# Patient Record
Sex: Male | Born: 1945
Health system: Southern US, Community
[De-identification: ages and names within clinical notes are randomized; demographics above are authoritative.]

## PROBLEM LIST (undated history)

## (undated) DIAGNOSIS — M1990 Unspecified osteoarthritis, unspecified site: Secondary | ICD-10-CM

## (undated) DIAGNOSIS — I2510 Atherosclerotic heart disease of native coronary artery without angina pectoris: Secondary | ICD-10-CM

## (undated) DIAGNOSIS — I10 Essential (primary) hypertension: Secondary | ICD-10-CM

## (undated) DIAGNOSIS — I5022 Chronic systolic (congestive) heart failure: Secondary | ICD-10-CM

## (undated) DIAGNOSIS — N183 Chronic kidney disease, stage 3 unspecified: Secondary | ICD-10-CM

## (undated) DIAGNOSIS — I255 Ischemic cardiomyopathy: Secondary | ICD-10-CM

## (undated) DIAGNOSIS — M109 Gout, unspecified: Secondary | ICD-10-CM

## (undated) DIAGNOSIS — E785 Hyperlipidemia, unspecified: Secondary | ICD-10-CM

## (undated) DIAGNOSIS — I509 Heart failure, unspecified: Secondary | ICD-10-CM

## (undated) HISTORY — PX: CARDIAC CATHETERIZATION: SHX172

## (undated) HISTORY — PX: CARDIAC SURGERY: SHX584

## (undated) HISTORY — DX: Hyperlipidemia, unspecified: E78.5

## (undated) HISTORY — DX: Chronic kidney disease, stage 3 unspecified: N18.30

## (undated) HISTORY — DX: Unspecified osteoarthritis, unspecified site: M19.90

## (undated) HISTORY — DX: Heart failure, unspecified: I50.9

## (undated) HISTORY — DX: Ischemic cardiomyopathy: I25.5

## (undated) HISTORY — DX: Chronic systolic (congestive) heart failure: I50.22

---

## 2004-10-16 ENCOUNTER — Emergency Department: Payer: Self-pay | Admitting: Emergency Medicine

## 2008-10-04 ENCOUNTER — Emergency Department: Payer: Self-pay | Admitting: Emergency Medicine

## 2009-08-04 ENCOUNTER — Emergency Department: Payer: Self-pay | Admitting: Emergency Medicine

## 2010-07-02 ENCOUNTER — Emergency Department: Payer: Self-pay | Admitting: Emergency Medicine

## 2011-11-07 ENCOUNTER — Emergency Department: Payer: Self-pay | Admitting: Emergency Medicine

## 2011-11-07 LAB — BASIC METABOLIC PANEL
BUN: 12 mg/dL (ref 7–18)
Chloride: 105 mmol/L (ref 98–107)
Co2: 27 mmol/L (ref 21–32)
Creatinine: 1.22 mg/dL (ref 0.60–1.30)
EGFR (Non-African Amer.): >60
Osmolality: 279 (ref 275–301)
Potassium: 4 mmol/L (ref 3.5–5.1)
Sodium: 140 mmol/L (ref 136–145)

## 2011-11-07 LAB — CBC
HCT: 45.8 % (ref 40.0–52.0)
HGB: 15.9 g/dL (ref 13.0–18.0)
MCH: 30.5 pg (ref 26.0–34.0)
MCV: 88 fL (ref 80–100)
Platelet: 261 10*3/uL (ref 150–440)
RBC: 5.2 10*6/uL (ref 4.40–5.90)
WBC: 8.2 10*3/uL (ref 3.8–10.6)

## 2011-11-07 LAB — TROPONIN I
Troponin-I: <0.02 ng/mL
Troponin-I: <0.02 ng/mL

## 2013-09-20 ENCOUNTER — Emergency Department: Payer: Self-pay | Admitting: Emergency Medicine

## 2013-09-20 LAB — COMPREHENSIVE METABOLIC PANEL
ALK PHOS: 65 U/L
AST: 29 U/L (ref 15–37)
Albumin: 3.7 g/dL (ref 3.4–5.0)
Anion Gap: 7 (ref 7–16)
BUN: 15 mg/dL (ref 7–18)
Bilirubin,Total: 0.6 mg/dL (ref 0.2–1.0)
Calcium, Total: 9 mg/dL (ref 8.5–10.1)
Chloride: 106 mmol/L (ref 98–107)
Co2: 25 mmol/L (ref 21–32)
Creatinine: 1.22 mg/dL (ref 0.60–1.30)
EGFR (Non-African Amer.): >60
GLUCOSE: 121 mg/dL — AB (ref 65–99)
Osmolality: 278 (ref 275–301)
Potassium: 3.5 mmol/L (ref 3.5–5.1)
SGPT (ALT): 40 U/L
Sodium: 138 mmol/L (ref 136–145)
Total Protein: 8.3 g/dL — ABNORMAL HIGH (ref 6.4–8.2)

## 2013-09-20 LAB — CBC WITH DIFFERENTIAL/PLATELET
BASOS ABS: 0.1 10*3/uL (ref 0.0–0.1)
Basophil %: 1.3 %
EOS ABS: 0.2 10*3/uL (ref 0.0–0.7)
Eosinophil %: 1.7 %
HCT: 42 % (ref 40.0–52.0)
HGB: 14.1 g/dL (ref 13.0–18.0)
Lymphocyte #: 2.6 10*3/uL (ref 1.0–3.6)
Lymphocyte %: 28.2 %
MCH: 29.5 pg (ref 26.0–34.0)
MCHC: 33.5 g/dL (ref 32.0–36.0)
MCV: 88 fL (ref 80–100)
MONO ABS: 1.1 x10 3/mm — AB (ref 0.2–1.0)
MONOS PCT: 12 %
Neutrophil #: 5.2 10*3/uL (ref 1.4–6.5)
Neutrophil %: 56.8 %
PLATELETS: 351 10*3/uL (ref 150–440)
RBC: 4.76 10*6/uL (ref 4.40–5.90)
RDW: 14.6 % — ABNORMAL HIGH (ref 11.5–14.5)
WBC: 9.1 10*3/uL (ref 3.8–10.6)

## 2015-04-29 ENCOUNTER — Encounter: Payer: Self-pay | Admitting: Emergency Medicine

## 2015-04-29 ENCOUNTER — Emergency Department: Payer: Medicare HMO

## 2015-04-29 ENCOUNTER — Emergency Department
Admission: EM | Admit: 2015-04-29 | Discharge: 2015-04-29 | Disposition: A | Discharge status: Home / Self Care | Payer: Medicare HMO | Attending: Emergency Medicine | Admitting: Emergency Medicine

## 2015-04-29 DIAGNOSIS — R509 Fever, unspecified: Secondary | ICD-10-CM | POA: Insufficient documentation

## 2015-04-29 DIAGNOSIS — R05 Cough: Secondary | ICD-10-CM | POA: Diagnosis present

## 2015-04-29 DIAGNOSIS — Z951 Presence of aortocoronary bypass graft: Secondary | ICD-10-CM | POA: Insufficient documentation

## 2015-04-29 DIAGNOSIS — J209 Acute bronchitis, unspecified: Secondary | ICD-10-CM | POA: Insufficient documentation

## 2015-04-29 DIAGNOSIS — I2510 Atherosclerotic heart disease of native coronary artery without angina pectoris: Secondary | ICD-10-CM | POA: Insufficient documentation

## 2015-04-29 DIAGNOSIS — I10 Essential (primary) hypertension: Secondary | ICD-10-CM | POA: Diagnosis not present

## 2015-04-29 HISTORY — DX: Essential (primary) hypertension: I10

## 2015-04-29 HISTORY — DX: Atherosclerotic heart disease of native coronary artery without angina pectoris: I25.10

## 2015-04-29 LAB — URINALYSIS COMPLETE WITH MICROSCOPIC (ARMC ONLY)
BILIRUBIN URINE: NEGATIVE
Bacteria, UA: NONE SEEN
GLUCOSE, UA: NEGATIVE mg/dL
KETONES UR: NEGATIVE mg/dL
LEUKOCYTES UA: NEGATIVE
NITRITE: NEGATIVE
Protein, ur: 30 mg/dL — AB
SQUAMOUS EPITHELIAL / LPF: NONE SEEN
Specific Gravity, Urine: 1.017 (ref 1.005–1.030)
pH: 5 (ref 5.0–8.0)

## 2015-04-29 LAB — BASIC METABOLIC PANEL
ANION GAP: 7 (ref 5–15)
BUN: 16 mg/dL (ref 6–20)
CALCIUM: 8.9 mg/dL (ref 8.9–10.3)
CO2: 23 mmol/L (ref 22–32)
Chloride: 106 mmol/L (ref 101–111)
Creatinine, Ser: 1.34 mg/dL — ABNORMAL HIGH (ref 0.61–1.24)
GFR, EST NON AFRICAN AMERICAN: 52 mL/min — AB (ref >60)
Glucose, Bld: 114 mg/dL — ABNORMAL HIGH (ref 65–99)
POTASSIUM: 4.2 mmol/L (ref 3.5–5.1)
SODIUM: 136 mmol/L (ref 135–145)

## 2015-04-29 LAB — CBC WITH DIFFERENTIAL/PLATELET
BASOS ABS: 0.1 10*3/uL (ref 0–0.1)
BASOS PCT: 1 %
EOS PCT: 6 %
Eosinophils Absolute: 0.3 10*3/uL (ref 0–0.7)
HCT: 44.6 % (ref 40.0–52.0)
Hemoglobin: 15 g/dL (ref 13.0–18.0)
LYMPHS PCT: 29 %
Lymphs Abs: 1.8 10*3/uL (ref 1.0–3.6)
MCH: 29 pg (ref 26.0–34.0)
MCHC: 33.5 g/dL (ref 32.0–36.0)
MCV: 86.4 fL (ref 80.0–100.0)
MONO ABS: 0.9 10*3/uL (ref 0.2–1.0)
Monocytes Relative: 14 %
NEUTROS ABS: 3.1 10*3/uL (ref 1.4–6.5)
Neutrophils Relative %: 50 %
PLATELETS: 209 10*3/uL (ref 150–440)
RBC: 5.17 MIL/uL (ref 4.40–5.90)
RDW: 14.4 % (ref 11.5–14.5)
WBC: 6.1 10*3/uL (ref 3.8–10.6)

## 2015-04-29 LAB — RAPID INFLUENZA A&B ANTIGENS (ARMC ONLY): INFLUENZA A (ARMC): NEGATIVE

## 2015-04-29 LAB — RAPID INFLUENZA A&B ANTIGENS: Influenza B (ARMC): NEGATIVE

## 2015-04-29 MED ORDER — IPRATROPIUM-ALBUTEROL 0.5-2.5 (3) MG/3ML IN SOLN
3.0000 mL | Freq: Once | RESPIRATORY_TRACT | Status: AC
  Administered 2015-04-29: 3 mL via RESPIRATORY_TRACT
  Filled 2015-04-29: qty 3

## 2015-04-29 MED ORDER — AZITHROMYCIN 250 MG PO TABS: ORAL_TABLET | ORAL | Status: AC

## 2015-04-29 MED ORDER — GUAIFENESIN-CODEINE 100-10 MG/5ML PO SOLN: 5.0000 mL | Freq: Four times a day (QID) | ORAL | Status: DC | PRN

## 2015-04-29 MED ORDER — HYDROCOD POLST-CPM POLST ER 10-8 MG/5ML PO SUER
5.0000 mL | Freq: Once | ORAL | Status: AC
  Administered 2015-04-29: 5 mL via ORAL
  Filled 2015-04-29: qty 5

## 2015-04-29 NOTE — ED Notes (Signed)
C/o fever x 1 week.  Also c/o productive cough and frequent urination.

## 2015-04-29 NOTE — Discharge Instructions (Signed)

## 2015-04-29 NOTE — ED Notes (Signed)
Pt unable to void at this moment. Given cup for when is able to urinate.

## 2015-04-29 NOTE — ED Provider Notes (Signed)
[facility] Emergency Department Provider Note  Time seen: 9:26 PM  I have reviewed the triage vital signs and the nursing notes.   HISTORY  Chief Complaint Fever and Cough    HPI Benjamin Valentine is a 70 y.o. male who presents to the emergency department with cough, congestion and fever. According to the patient for the past one week he has had a cough with fever. Patient states the cough is productive of a yellowish sputum. Denies abdominal pain. Denies dysuria. States some nausea denies vomiting or diarrhea. Patient states his symptoms have seemed to worsen with increased cough so he came to the emergency department for evaluation. Patient denies chest pain. Does states shortness breath but mostly with cough.Describes his cough as significant.     Past Medical History  Diagnosis Date  . Hypertension   . Coronary artery disease     There are no active problems to display for this patient.   Past Surgical History  Procedure Laterality Date  . Cardiac surgery      CABG 2002    No current outpatient prescriptions on file.  Allergies Review of patient's allergies indicates no known allergies.  No family history on file.  Social History Social History  Substance Use Topics  . Smoking status: Never Smoker   . Smokeless tobacco: Never Used  . Alcohol Use: No    Review of Systems Constitutional: Negative for fever. Cardiovascular: Negative for chest pain. Respiratory: Positive shortness of breath with frequent cough. Positive for sputum. Gastrointestinal: Negative for abdominal pain Genitourinary: Negative for dysuria. Positive for urinary frequency. Musculoskeletal: Negative for back pain. Neurological: Negative for headache 10-point ROS otherwise negative.  ____________________________________________   PHYSICAL EXAM:  VITAL SIGNS: ED Triage Vitals  Enc Vitals Group     BP 04/29/15 1822 167/79 mmHg     Pulse Rate 04/29/15 1822 31      Resp 04/29/15 1822 20     Temp 04/29/15 1822 99.5 F (37.5 C)     Temp Source 04/29/15 1822 Oral     SpO2 04/29/15 1822 97 %     Weight 04/29/15 1822 222 lb (100.699 kg)     Height 04/29/15 1822  (1.753 m)     Head Cir --      Peak Flow --      Pain Score 04/29/15 1824 5     Pain Loc --      Pain Edu? --      Excl. in GC? --     Constitutional: Alert and oriented. Well appearing and in no distress. Eyes: Normal exam ENT   Head: Normocephalic and atraumatic.   Mouth/Throat: Mucous membranes are moist. Cardiovascular: Normal rate, regular rhythm. No murmur Respiratory: Normal respiratory effort without tachypnea nor retractions. Breath sounds are clear. Frequent cough. Gastrointestinal: Soft and nontender. No distention.   Musculoskeletal: Nontender with normal range of motion in all extremities. Neurologic:  Normal speech and language. No gross focal neurologic deficits Skin:  Skin is warm, dry and intact.  Psychiatric: Mood and affect are normal. Speech and behavior are normal.   ____________________________________________   RADIOLOGY  Mild vascular congestion on chest x-ray.   INITIAL IMPRESSION / ASSESSMENT AND PLAN / ED COURSE  Pertinent labs & imaging results that were available during my care of the patient were reviewed by me and considered in my medical decision making (see chart for details).  Patient presents with continued cough and fever times one week. Believes his symptoms  have worsened since onset of improving so he came to the emergency department for evaluation. Patient has a temperature of 99.5 in the emergency department. Vitals are otherwise reassuring, moderately hypertensive currently. Labs are largely within normal limits including a normal white blood cell count. Chest x-ray shows mild fascia congestion otherwise normal. Currently awaiting flu results. Suspect likely a viral process versus acute bronchitis.  Labs largely within normal  limits. Influenza negative. Likely bronchitis. We'll discharge with antibiotics, cough medication and have the patient follow-up with his primary care physician. Patient agreeable to plan.  ____________________________________________   FINAL CLINICAL IMPRESSION(S) / ED DIAGNOSES  Upper respiratory infection Bronchitis  Minna AntisKevin Jalisia Puchalski, MD 04/29/15 2333

## 2015-09-28 ENCOUNTER — Emergency Department
Admission: EM | Admit: 2015-09-28 | Discharge: 2015-09-28 | Disposition: A | Discharge status: Home / Self Care | Payer: Medicare HMO | Attending: Emergency Medicine | Admitting: Emergency Medicine

## 2015-09-28 ENCOUNTER — Emergency Department: Payer: Medicare HMO

## 2015-09-28 DIAGNOSIS — R42 Dizziness and giddiness: Secondary | ICD-10-CM | POA: Diagnosis not present

## 2015-09-28 DIAGNOSIS — I10 Essential (primary) hypertension: Secondary | ICD-10-CM | POA: Insufficient documentation

## 2015-09-28 LAB — BASIC METABOLIC PANEL WITH GFR
Anion gap: 6 (ref 5–15)
BUN: 20 mg/dL (ref 6–20)
CO2: 25 mmol/L (ref 22–32)
Calcium: 9 mg/dL (ref 8.9–10.3)
Chloride: 108 mmol/L (ref 101–111)
Creatinine, Ser: 1.23 mg/dL (ref 0.61–1.24)
GFR calc Af Amer: >60 mL/min
GFR calc non Af Amer: 57 mL/min — ABNORMAL LOW
Glucose, Bld: 144 mg/dL — ABNORMAL HIGH (ref 65–99)
Potassium: 4.2 mmol/L (ref 3.5–5.1)
Sodium: 139 mmol/L (ref 135–145)

## 2015-09-28 LAB — CBC
HCT: 43 % (ref 40.0–52.0)
Hemoglobin: 14.5 g/dL (ref 13.0–18.0)
MCH: 29.3 pg (ref 26.0–34.0)
MCHC: 33.8 g/dL (ref 32.0–36.0)
MCV: 86.9 fL (ref 80.0–100.0)
Platelets: 243 K/uL (ref 150–440)
RBC: 4.95 MIL/uL (ref 4.40–5.90)
RDW: 15 % — ABNORMAL HIGH (ref 11.5–14.5)
WBC: 6.7 K/uL (ref 3.8–10.6)

## 2015-09-28 LAB — URINALYSIS COMPLETE WITH MICROSCOPIC (ARMC ONLY)
Bacteria, UA: NONE SEEN
Bilirubin Urine: NEGATIVE
Glucose, UA: 50 mg/dL — AB
KETONES UR: NEGATIVE mg/dL
LEUKOCYTES UA: NEGATIVE
Nitrite: NEGATIVE
PH: 5 (ref 5.0–8.0)
Protein, ur: NEGATIVE mg/dL
SPECIFIC GRAVITY, URINE: 1.016 (ref 1.005–1.030)
SQUAMOUS EPITHELIAL / LPF: NONE SEEN

## 2015-09-28 LAB — TROPONIN I: Troponin I: <0.03 ng/mL (ref <0.03)

## 2015-09-28 MED ORDER — METOPROLOL SUCCINATE ER 50 MG PO TB24
50.0000 mg | ORAL_TABLET | ORAL | Status: AC
  Administered 2015-09-28: 50 mg via ORAL
  Filled 2015-09-28: qty 1

## 2015-09-28 NOTE — Discharge Instructions (Addendum)
If you develop any new or worsening symptoms that concern you, including but not limited to persistent dizziness/vertigo, numbness or weakness in your arms or legs, altered mental status, persistent vomiting, or fever greater than 101, please return immediately to the Emergency Department.

## 2015-09-28 NOTE — ED Provider Notes (Signed)
[facility] Emergency Department Provider Note   ____________________________________________   First MD Initiated Contact with Patient 09/28/15 1217     (approximate)  I have reviewed the triage vital signs and the nursing notes.   HISTORY  Chief Complaint Dizziness    HPI Benjamin Valentine is a 70 y.o. male   Patient reports his a history of high blood pressure. For about the last week and a half he's been having episodes where he feels slightly "lightheaded" or "woozy". No fevers or chills. No headache. No numbness or weakness. Reports it seems more prominent if he is moving about.  No chest pain or shortness of breath. No fever. No nausea or vomiting. Reports he feels absolutely fine right now, but occasionally will feel a sense of lightheadedness.   Past Medical History:  Diagnosis Date  . Coronary artery disease   . Hypertension     There are no active problems to display for this patient.   Past Surgical History:  Procedure Laterality Date  . CARDIAC SURGERY     CABG 2002    Prior to Admission medications   Medication Sig Start Date End Date Taking? Authorizing Provider  guaiFENesin-codeine 100-10 MG/5ML syrup Take 5 mLs by mouth every 6 (six) hours as needed for cough. 04/29/15   Minna AntisKevin Paduchowski, MD    Allergies Review of patient's allergies indicates no known allergies.  No family history on file.  Social History Social History  Substance Use Topics  . Smoking status: Never Smoker  . Smokeless tobacco: Never Used  . Alcohol use No    Review of Systems Constitutional: No fever/chills Eyes: No visual changes. ENT: No sore throat. Cardiovascular: Denies chest pain. Respiratory: Denies shortness of breath. Gastrointestinal: No abdominal pain.  No nausea, no vomiting.  No diarrhea.  No constipation. Genitourinary: Negative for dysuria. Musculoskeletal: Negative for back pain. Skin: Negative for rash. Neurological:  Negative for headaches, focal weakness or numbness.  10-point ROS otherwise negative.  ____________________________________________   PHYSICAL EXAM:  VITAL SIGNS: ED Triage Vitals [09/28/15 1003]  Enc Vitals Group     BP (!) 162/90     Pulse Rate 71     Resp 18     Temp 98.3 F (36.8 C)     Temp Source Oral     SpO2 98 %     Weight 240 lb (108.9 kg)     Height 5\' 9"  (1.753 m)     Head Circumference      Peak Flow      Pain Score      Pain Loc      Pain Edu?      Excl. in GC?     Constitutional: Alert and oriented. Well appearing and in no acute distress. Eyes: Conjunctivae are normal. PERRL. EOMI. Head: Atraumatic. Nose: No congestion/rhinnorhea. Mouth/Throat: Mucous membranes are moist.  Neck: No stridor.   Cardiovascular: Normal rate, regular rhythm. Grossly normal heart sounds.  Good peripheral circulation. Respiratory: Normal respiratory effort.  No retractions. Lungs CTAB. Gastrointestinal: Soft and nontender. No distention. No abdominal bruits.  Musculoskeletal: No lower extremity tenderness nor edema.   Neurologic:  NIH score equals 0, performed by me at bedside. The patient has no pronator drift. The patient has normal cranial nerve exam. Extraocular movements are normal. Visual fields are normal. Patient has 5 out of 5 strength in all extremities. There is no numbness or gross, acute sensory abnormality in the extremities bilaterally. No speech disturbance. No dysarthria.  No aphasia. No ataxia. Normal finger nose finger bilat. Patient speaking in full and clear sentences. Skin:  Skin is warm, dry and intact. No rash noted. Psychiatric: Mood and affect are normal. Speech and behavior are normal.  ____________________________________________   LABS (all labs ordered are listed, but only abnormal results are displayed)  Labs Reviewed  BASIC METABOLIC PANEL - Abnormal; Notable for the following:       Result Value   Glucose, Bld 144 (*)    GFR calc  non Af Amer 57 (*)    All other components within normal limits  CBC - Abnormal; Notable for the following:    RDW 15.0 (*)    All other components within normal limits  URINALYSIS COMPLETEWITH MICROSCOPIC ([facility] ONLY) - Abnormal; Notable for the following:    Color, Urine YELLOW (*)    APPearance CLEAR (*)    Glucose, UA 50 (*)    Hgb urine dipstick 1+ (*)    All other components within normal limits  TROPONIN I  CBG MONITORING, ED   ____________________________________________  EKG  Reviewed and interpreted by me at noon That she rates 70 QTc 440 QRS 95 Normal sinus rhythm, no evidence of acute ischemic change. Suspect probable left ventricular hypertrophy ____________________________________________  RADIOLOGY  Ct Head Wo Contrast  Result Date: 09/28/2015 CLINICAL DATA:  Dizziness for 24 hours EXAM: CT HEAD WITHOUT CONTRAST TECHNIQUE: Contiguous axial images were obtained from the base of the skull through the vertex without intravenous contrast. COMPARISON:  None. FINDINGS: Brain: No evidence of acute infarction, hemorrhage, hydrocephalus, extra-axial collection or mass lesion/mass effect. Vascular: No hyperdense vessel or unexpected calcification. Skull: No acute bony abnormality noted. Sinuses/Orbits: No acute finding. Other: Mild atrophic changes are noted. IMPRESSION: Mild atrophy without acute abnormality. Electronically Signed   By: Alcide CleverMark  Lukens M.D.   On: 09/28/2015 13:30    ____________________________________________   PROCEDURES  Procedure(s) performed: None  Procedures  Critical Care performed: No  ____________________________________________   INITIAL IMPRESSION / ASSESSMENT AND PLAN / ED COURSE  Pertinent labs & imaging results that were available during my care of the patient were reviewed by me and considered in my medical decision making (see chart for details).  evaluation of intermittent lightheadedness. No focal neurologic deficits, no nausea  or vomiting. No cardiac or pulmonary symptoms. Blood pressure slightly elevated, however after giving home medication and normalizes. Labs and CT the head very reassuring, no evidence of neurologic deficit. Discussed with the patient, he is quite stable and he is asymptomatic presently will have him follow closely with his doctor. Patient is agreeable with the plan.  Clinical Course   Return precautions and treatment recommendations and follow-up discussed with the patient who is agreeable with the plan.   ____________________________________________   FINAL CLINICAL IMPRESSION(S) / ED DIAGNOSES  Final diagnoses:  Dizziness      NEW MEDICATIONS STARTED DURING THIS VISIT:  New Prescriptions   No medications on file     Note:  This document was prepared using Dragon voice recognition software and may include unintentional dictation errors.     Sharyn CreamerMark Quale, MD 09/28/15 1556

## 2015-09-28 NOTE — ED Notes (Signed)

## 2015-09-28 NOTE — ED Triage Notes (Signed)
Pt c/o feeling lightheaded and dizzy for the past week, worse with movement..Marland Kitchen

## 2015-11-14 ENCOUNTER — Emergency Department
Admission: EM | Admit: 2015-11-14 | Discharge: 2015-11-14 | Disposition: A | Discharge status: Home / Self Care | Payer: Medicare HMO | Attending: Student | Admitting: Student

## 2015-11-14 ENCOUNTER — Encounter: Payer: Self-pay | Admitting: Emergency Medicine

## 2015-11-14 ENCOUNTER — Emergency Department: Payer: Medicare HMO

## 2015-11-14 DIAGNOSIS — M25462 Effusion, left knee: Secondary | ICD-10-CM | POA: Diagnosis not present

## 2015-11-14 DIAGNOSIS — I10 Essential (primary) hypertension: Secondary | ICD-10-CM | POA: Insufficient documentation

## 2015-11-14 DIAGNOSIS — M25562 Pain in left knee: Secondary | ICD-10-CM | POA: Diagnosis present

## 2015-11-14 DIAGNOSIS — I2510 Atherosclerotic heart disease of native coronary artery without angina pectoris: Secondary | ICD-10-CM | POA: Diagnosis not present

## 2015-11-14 HISTORY — DX: Unspecified osteoarthritis, unspecified site: M19.90

## 2015-11-14 MED ORDER — KETOROLAC TROMETHAMINE 30 MG/ML IJ SOLN
30.0000 mg | Freq: Once | INTRAMUSCULAR | Status: AC
  Administered 2015-11-14: 30 mg via INTRAMUSCULAR
  Filled 2015-11-14: qty 1

## 2015-11-14 MED ORDER — NAPROXEN 500 MG PO TABS: 500.0000 mg | ORAL_TABLET | Freq: Two times a day (BID) | ORAL | 0 refills | Status: DC

## 2015-11-14 NOTE — ED Triage Notes (Signed)
Pt presents with left knee pain. Pt reports swelling and heat to knee. Pt reports history of gout.

## 2015-11-14 NOTE — ED Provider Notes (Signed)
[facility] Emergency Department Provider Note  ____________________________________________  Time seen: Approximately 11:25 AM  I have reviewed the triage vital signs and the nursing notes.   HISTORY  Chief Complaint Knee Pain    HPI Benjamin Valentine is a 70 y.o. male , NAD, presents to the emergency department with several day history of left knee pain, swelling and warmth. Patient states he has a history of gout and takes colchicine on a daily basis. States he had increasing swelling, warmth and pain about the left knee over the last couple of days that has not been controlled by the colchicine. Pain increases with weightbearing. Has not taken anything else for his pain. Denies any injuries, traumas or falls. Denies any left lower leg, ankle or foot pain, warmth, swelling or skin sores. Has not had any chest pain or shortness of breath. No fevers or chills. No numbness, weakness, tingling.   Past Medical History:  Diagnosis Date  . Arthritis   . Coronary artery disease   . Hypertension     There are no active problems to display for this patient.   Past Surgical History:  Procedure Laterality Date  . CARDIAC SURGERY     CABG 2002    Prior to Admission medications   Medication Sig Start Date End Date Taking? Authorizing Provider  guaiFENesin-codeine 100-10 MG/5ML syrup Take 5 mLs by mouth every 6 (six) hours as needed for cough. 04/29/15   Minna Antis, MD  naproxen (NAPROSYN) 500 MG tablet Take 1 tablet (500 mg total) by mouth 2 (two) times daily with a meal. 11/14/15   Angelica Wix L Tya Haughey, [name]    Allergies Review of patient's allergies indicates no known allergies.  No family history on file.  Social History Social History  Substance Use Topics  . Smoking status: Never Smoker  . Smokeless tobacco: Never Used  . Alcohol use No     Review of Systems  Constitutional: No fever/chills Cardiovascular: No chest pain. Respiratory: No shortness  of breath.  Musculoskeletal: Positive left knee pain. Negative left lower leg pain. Skin: Positive swelling, abnormal warmth of the left knee. Negative for rash, redness, skin sores, open wounds. Neurological: Negative for numbness, weakness, tingling. 10-point ROS otherwise negative.  ____________________________________________   PHYSICAL EXAM:  VITAL SIGNS: ED Triage Vitals  Enc Vitals Group     BP 11/14/15 1110 127/69     Pulse Rate 11/14/15 1110 84     Resp 11/14/15 1110 16     Temp 11/14/15 1110 98.2 F (36.8 C)     Temp Source 11/14/15 1110 Oral     SpO2 11/14/15 1110 98 %     Weight 11/14/15 1110 245 lb (111.1 kg)     Height 11/14/15 1110 5\' 9"  (1.753 m)     Head Circumference --      Peak Flow --      Pain Score 11/14/15 1115 9     Pain Loc --      Pain Edu? --      Excl. in GC? --      Constitutional: Alert and oriented. Well appearing and in no acute distress. Eyes: Conjunctivae are normal.  Head: Atraumatic. Cardiovascular: Good peripheral circulation with 2+ pulses noted in the left lower extremity. Respiratory: Normal respiratory effort without tachypnea or retractions.  Musculoskeletal: Tenderness to palpation about the proximal and medial portion of the left knee with mild fluctuance. Anterior knee with diffuse abnormal warmth without skin sores or lesions. Full range  of motion of the left knee but pain with full flexion and extension. Mild swelling and muscle tightness is noted about the posterior, medial knee. No lower extremity tenderness nor edema.  No joint effusions. Neurologic:  Normal speech and language. No gross focal neurologic deficits are appreciated. Sensation on light touch of the left lower extremity is grossly intact Skin:  Skin is warm, dry and intact. No rash noted. Psychiatric: Mood and affect are normal. Speech and behavior are normal. Patient exhibits appropriate insight and judgement.   ____________________________________________    LABS  None ____________________________________________  EKG  None ____________________________________________  RADIOLOGY I have personally viewed and evaluated these images (plain radiographs) as part of my medical decision making, as well as reviewing the written report by the radiologist.  Dg Knee Complete 4 Views Left  Result Date: 11/14/2015 CLINICAL DATA:  Worsening left knee pain for the past week. History of gout involving other joints. Left knee is tender and swollen. EXAM: LEFT KNEE - COMPLETE 4+ VIEW COMPARISON:  None. FINDINGS: No fracture or dislocation. Mild tricompartmental degenerative change of the knee with joint space loss, subchondral sclerosis and osteophytosis. There is minimal spurring of the tibial spines. No evidence of chondrocalcinosis. Moderate-sized joint effusion. Enthesopathic change involving the superior and inferior poles of the patella as well as the tibial tuberosity. Ill-defined ossification about the anterior lateral aspect of the proximal tibia metaphysis is likely the sequela of remote avulsive injury. Surgical clips are noted about the medial aspect of the knee. No radiopaque foreign body. IMPRESSION: 1. Moderate-sized joint effusion.  Otherwise, no acute findings. 2. Mild tricompartmental degenerative change of the knee. Electronically Signed   By: Simonne ComeJohn  Watts M.D.   On: 11/14/2015 12:09    ____________________________________________    PROCEDURES  Procedure(s) performed: None   Procedures   Medications  ketorolac (TORADOL) 30 MG/ML injection 30 mg (30 mg Intramuscular Given 11/14/15 1248)     ____________________________________________   INITIAL IMPRESSION / ASSESSMENT AND PLAN / ED COURSE  Pertinent labs & imaging results that were available during my care of the patient were reviewed by me and considered in my medical decision making (see chart for details).  Clinical Course    Patient's diagnosis is consistent with Left  knee effusion. Patient will be discharged home with prescriptions for naproxen to take as directed. Patient states he has been using crutches at home which seemed to help and he may continue to do so but highly advised to limit walking to a minimum as do not want to risk a fall. Patient is to follow up with Dr. Hyacinth MeekerMiller in orthopedics in 2-3 days for further evaluation and treatment. Patient is given ED precautions to return to the ED for any worsening or new symptoms.    ____________________________________________  FINAL CLINICAL IMPRESSION(S) / ED DIAGNOSES  Final diagnoses:  Knee effusion, left      NEW MEDICATIONS STARTED DURING THIS VISIT:  Discharge Medication List as of 11/14/2015 12:43 PM    START taking these medications   Details  naproxen (NAPROSYN) 500 MG tablet Take 1 tablet (500 mg total) by mouth 2 (two) times daily with a meal., Starting Sun 11/14/2015, Print             Hope PigeonJami L Maika Kaczmarek, [name] 11/14/15 1348    Gayla DossEryka A Gayle, MD 11/14/15 1549

## 2016-12-16 ENCOUNTER — Encounter: Payer: Self-pay | Admitting: Emergency Medicine

## 2016-12-16 ENCOUNTER — Emergency Department: Payer: Medicare HMO

## 2016-12-16 ENCOUNTER — Emergency Department
Admission: EM | Admit: 2016-12-16 | Discharge: 2016-12-16 | Disposition: A | Discharge status: Home / Self Care | Payer: Medicare HMO | Attending: Student in an Organized Health Care Education/Training Program | Admitting: Student in an Organized Health Care Education/Training Program

## 2016-12-16 DIAGNOSIS — I10 Essential (primary) hypertension: Secondary | ICD-10-CM | POA: Insufficient documentation

## 2016-12-16 DIAGNOSIS — Y939 Activity, unspecified: Secondary | ICD-10-CM | POA: Insufficient documentation

## 2016-12-16 DIAGNOSIS — S161XXA Strain of muscle, fascia and tendon at neck level, initial encounter: Secondary | ICD-10-CM | POA: Insufficient documentation

## 2016-12-16 DIAGNOSIS — I2510 Atherosclerotic heart disease of native coronary artery without angina pectoris: Secondary | ICD-10-CM | POA: Diagnosis not present

## 2016-12-16 DIAGNOSIS — Y92410 Unspecified street and highway as the place of occurrence of the external cause: Secondary | ICD-10-CM | POA: Insufficient documentation

## 2016-12-16 DIAGNOSIS — S199XXA Unspecified injury of neck, initial encounter: Secondary | ICD-10-CM | POA: Diagnosis present

## 2016-12-16 DIAGNOSIS — M7918 Myalgia, other site: Secondary | ICD-10-CM

## 2016-12-16 DIAGNOSIS — Y998 Other external cause status: Secondary | ICD-10-CM | POA: Diagnosis not present

## 2016-12-16 MED ORDER — IBUPROFEN 600 MG PO TABS: 600.0000 mg | ORAL_TABLET | Freq: Four times a day (QID) | ORAL | 0 refills | Status: DC | PRN
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
## 2016-12-26 ENCOUNTER — Emergency Department
Admission: EM | Admit: 2016-12-26 | Discharge: 2016-12-26 | Disposition: A | Discharge status: Home / Self Care | Payer: Medicare HMO | Attending: Emergency Medicine | Admitting: Emergency Medicine

## 2016-12-26 ENCOUNTER — Other Ambulatory Visit: Payer: Self-pay

## 2016-12-26 DIAGNOSIS — I2510 Atherosclerotic heart disease of native coronary artery without angina pectoris: Secondary | ICD-10-CM | POA: Insufficient documentation

## 2016-12-26 DIAGNOSIS — Y999 Unspecified external cause status: Secondary | ICD-10-CM | POA: Diagnosis not present

## 2016-12-26 DIAGNOSIS — Y9389 Activity, other specified: Secondary | ICD-10-CM | POA: Insufficient documentation

## 2016-12-26 DIAGNOSIS — Y929 Unspecified place or not applicable: Secondary | ICD-10-CM | POA: Insufficient documentation

## 2016-12-26 DIAGNOSIS — I10 Essential (primary) hypertension: Secondary | ICD-10-CM | POA: Diagnosis not present

## 2016-12-26 DIAGNOSIS — R202 Paresthesia of skin: Secondary | ICD-10-CM | POA: Diagnosis not present

## 2016-12-26 DIAGNOSIS — S6412XA Injury of median nerve at wrist and hand level of left arm, initial encounter: Secondary | ICD-10-CM | POA: Diagnosis not present

## 2016-12-26 DIAGNOSIS — M5030 Other cervical disc degeneration, unspecified cervical region: Secondary | ICD-10-CM | POA: Diagnosis not present

## 2016-12-26 DIAGNOSIS — Z79899 Other long term (current) drug therapy: Secondary | ICD-10-CM | POA: Diagnosis not present

## 2016-12-26 DIAGNOSIS — S6992XA Unspecified injury of left wrist, hand and finger(s), initial encounter: Secondary | ICD-10-CM | POA: Diagnosis present

## 2016-12-26 MED ORDER — GABAPENTIN 300 MG PO CAPS: 300.0000 mg | ORAL_CAPSULE | Freq: Two times a day (BID) | ORAL | 0 refills | Status: DC
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
## 2016-12-26 NOTE — ED Notes (Signed)

## 2017-08-04 ENCOUNTER — Emergency Department
Admission: EM | Admit: 2017-08-04 | Discharge: 2017-08-05 | Disposition: A | Discharge status: Home / Self Care | Payer: Medicare HMO | Attending: Emergency Medicine | Admitting: Emergency Medicine

## 2017-08-04 ENCOUNTER — Emergency Department: Payer: Medicare HMO

## 2017-08-04 ENCOUNTER — Other Ambulatory Visit: Payer: Self-pay

## 2017-08-04 DIAGNOSIS — Z79899 Other long term (current) drug therapy: Secondary | ICD-10-CM | POA: Diagnosis not present

## 2017-08-04 DIAGNOSIS — Z87891 Personal history of nicotine dependence: Secondary | ICD-10-CM | POA: Insufficient documentation

## 2017-08-04 DIAGNOSIS — I509 Heart failure, unspecified: Secondary | ICD-10-CM | POA: Diagnosis not present

## 2017-08-04 DIAGNOSIS — J441 Chronic obstructive pulmonary disease with (acute) exacerbation: Secondary | ICD-10-CM | POA: Diagnosis not present

## 2017-08-04 DIAGNOSIS — I259 Chronic ischemic heart disease, unspecified: Secondary | ICD-10-CM | POA: Insufficient documentation

## 2017-08-04 DIAGNOSIS — I110 Hypertensive heart disease with heart failure: Secondary | ICD-10-CM | POA: Insufficient documentation

## 2017-08-04 DIAGNOSIS — R0602 Shortness of breath: Secondary | ICD-10-CM | POA: Diagnosis present

## 2017-08-04 LAB — CBC
HCT: 41.8 % (ref 40.0–52.0)
Hemoglobin: 14 g/dL (ref 13.0–18.0)
MCH: 29.9 pg (ref 26.0–34.0)
MCHC: 33.5 g/dL (ref 32.0–36.0)
MCV: 89.4 fL (ref 80.0–100.0)
PLATELETS: 243 10*3/uL (ref 150–440)
RBC: 4.68 MIL/uL (ref 4.40–5.90)
RDW: 14.7 % — AB (ref 11.5–14.5)
WBC: 8.6 10*3/uL (ref 3.8–10.6)

## 2017-08-04 LAB — BASIC METABOLIC PANEL
Anion gap: 6 (ref 5–15)
BUN: 24 mg/dL — ABNORMAL HIGH (ref 6–20)
CALCIUM: 9.3 mg/dL (ref 8.9–10.3)
CO2: 25 mmol/L (ref 22–32)
CREATININE: 1.51 mg/dL — AB (ref 0.61–1.24)
Chloride: 111 mmol/L (ref 101–111)
GFR calc non Af Amer: 44 mL/min — ABNORMAL LOW (ref >60)
GFR, EST AFRICAN AMERICAN: 51 mL/min — AB (ref >60)
Glucose, Bld: 102 mg/dL — ABNORMAL HIGH (ref 65–99)
Potassium: 4.8 mmol/L (ref 3.5–5.1)
SODIUM: 142 mmol/L (ref 135–145)

## 2017-08-04 LAB — TROPONIN I: Troponin I: 0.04 ng/mL (ref <0.03)

## 2017-08-04 NOTE — ED Triage Notes (Signed)
Patient to ED for complaint of shortness of breath. Patient states he gets winded just walking to the bathroom. Had a cold about two weeks ago "but got over it". History of CABG in 2006 without any problems since that time. Quit smoking and drinking "years ago". Able to speak in complete sentences without difficulty.

## 2017-08-04 NOTE — ED Provider Notes (Signed)
[facility] Emergency Department Provider Note  ____________________________________________   First MD Initiated Contact with Patient 08/04/17 2338     (approximate)  I have reviewed the triage vital signs and the nursing notes.   HISTORY  Chief Complaint Shortness of Breath   HPI Benjamin Valentine is a 72 y.o. male who comes to the emergency department with several days of cough and shortness of breath.  He has some white phlegm coming up when he coughs.  He has a known history of coronary artery disease but he denies known history of COPD.  He denies fevers or chills.  He has exertional shortness of breath but no chest pain.  His symptoms are mild to moderate severity worse with exertion improved with sitting down.  He does sleep on 2 pillows and does not wake at night short of breath.  He does have mild bilateral lower extremity swelling.  He has a known history of CHF although has not seen his cardiologist in 6 months "ever since he retired".    Past Medical History:  Diagnosis Date  . Arthritis   . Coronary artery disease   . Hypertension     There are no active problems to display for this patient.   Past Surgical History:  Procedure Laterality Date  . CARDIAC SURGERY     CABG 2002    Prior to Admission medications   Medication Sig Start Date End Date Taking? Authorizing Provider  albuterol (PROVENTIL HFA;VENTOLIN HFA) 108 (90 Base) MCG/ACT inhaler Inhale 2 puffs into the lungs every 6 (six) hours as needed for wheezing or shortness of breath. 08/05/17   Merrily Brittle, MD  azithromycin (ZITHROMAX Z-PAK) 250 MG tablet Take 2 tablets (500 mg) on  Day 1,  followed by 1 tablet (250 mg) once daily on Days 2 through 5. 08/05/17 08/10/17  Merrily Brittle, MD  gabapentin (NEURONTIN) 300 MG capsule Take 1 capsule (300 mg total) 2 (two) times daily by mouth. 12/26/16 01/25/17  Menshew, Charlesetta Ivory, [name]  guaiFENesin-codeine 100-10 MG/5ML syrup Take 5 mLs  by mouth every 6 (six) hours as needed for cough. 04/29/15   Minna Antis, MD  ibuprofen (ADVIL,MOTRIN) 600 MG tablet Take 1 tablet (600 mg total) by mouth every 6 (six) hours as needed. 12/16/16   Joni Reining, [name]  naproxen (NAPROSYN) 500 MG tablet Take 1 tablet (500 mg total) by mouth 2 (two) times daily with a meal. 11/14/15   Hagler, Jami L, [name]  predniSONE (DELTASONE) 50 MG tablet Take 1 tablet (50 mg total) by mouth daily for 4 days. 08/05/17 08/09/17  Merrily Brittle, MD  Spacer/Aero Chamber Mouthpiece MISC 1 Units by Does not apply route every 4 (four) hours as needed (wheezing). 08/05/17   Merrily Brittle, MD    Allergies Patient has no known allergies.  No family history on file.  Social History Social History   Tobacco Use  . Smoking status: Former Games developer  . Smokeless tobacco: Never Used  Substance Use Topics  . Alcohol use: No  . Drug use: No    Review of Systems Constitutional: No fever/chills Eyes: No visual changes. ENT: No sore throat. Cardiovascular: Denies chest pain. Respiratory: Positive for shortness of breath. Gastrointestinal: No abdominal pain.  No nausea, no vomiting.  No diarrhea.  No constipation. Genitourinary: Negative for dysuria. Musculoskeletal: Negative for back pain. Skin: Negative for rash. Neurological: Negative for headaches, focal weakness or numbness.   ____________________________________________   PHYSICAL EXAM:  VITAL SIGNS:  ED Triage Vitals  Enc Vitals Group     BP 08/04/17 2150 (!) 198/116     Pulse Rate 08/04/17 2150 86     Resp 08/04/17 2150 20     Temp 08/04/17 2150 98.8 F (37.1 C)     Temp Source 08/04/17 2150 Oral     SpO2 08/04/17 2150 99 %     Weight 08/04/17 2151 240 lb (108.9 kg)     Height 08/04/17 2151 5\' 9"  (1.753 m)     Head Circumference --      Peak Flow --      Pain Score 08/04/17 2151 0     Pain Loc --      Pain Edu? --      Excl. in GC? --     Constitutional: Alert and oriented x4  pleasant cooperative speaks in full clear sentences no diaphoresis Eyes: PERRL EOMI. Head: Atraumatic. Nose: No congestion/rhinnorhea. Mouth/Throat: No trismus Neck: No stridor.  Able to lie completely flat no JVD Cardiovascular: Normal rate, regular rhythm. Grossly normal heart sounds.  Good peripheral circulation. Respiratory: Increased respiratory effort with wheezing throughout and prolonged expiratory phase Gastrointestinal: Soft nontender Musculoskeletal: Legs equal in size with mild edema Neurologic:  Normal speech and language. No gross focal neurologic deficits are appreciated. Skin:  Skin is warm, dry and intact. No rash noted. Psychiatric: Mood and affect are normal. Speech and behavior are normal.    ____________________________________________   DIFFERENTIAL includes but not limited to  CHF exacerbation, COPD exacerbation, pulmonary embolism, pneumonia, pneumothorax ____________________________________________   LABS (all labs ordered are listed, but only abnormal results are displayed)  Labs Reviewed  BASIC METABOLIC PANEL - Abnormal; Notable for the following components:      Result Value   Glucose, Bld 102 (*)    BUN 24 (*)    Creatinine, Ser 1.51 (*)    GFR calc non Af Amer 44 (*)    GFR calc Af Amer 51 (*)    All other components within normal limits  CBC - Abnormal; Notable for the following components:   RDW 14.7 (*)    All other components within normal limits  TROPONIN I - Abnormal; Notable for the following components:   Troponin I 0.04 (*)    All other components within normal limits  BRAIN NATRIURETIC PEPTIDE - Abnormal; Notable for the following components:   B Natriuretic Peptide 726.0 (*)    All other components within normal limits  TROPONIN I - Abnormal; Notable for the following components:   Troponin I 0.04 (*)    All other components within normal limits    Lab work reviewed by me with stable troponin.  Slightly elevated BNP concerning  for slight fluid overload __________________________________________  EKG  ED ECG REPORT I, Merrily BrittleNeil Keagan Brislin, the attending physician, personally viewed and interpreted this ECG.  Date: 08/05/2017 EKG Time:  Rate: 89 Rhythm: normal sinus rhythm QRS Axis: Leftward axis Intervals: First-degree AV block ST/T Wave abnormalities: normal Narrative Interpretation: no evidence of acute ischemia  ____________________________________________  RADIOLOGY  Chest x-ray reviewed by me consistent with COPD ____________________________________________   PROCEDURES  Procedure(s) performed: no  Procedures  Critical Care performed: no  ____________________________________________   INITIAL IMPRESSION / ASSESSMENT AND PLAN / ED COURSE  Pertinent labs & imaging results that were available during my care of the patient were reviewed by me and considered in my medical decision making (see chart for details).   The patient arrives somewhat short of breath with  wheezing throughout.  He has a history of CHF and by chest x-ray likely COPD.  Unclear if he is compliant with his medications.  He is not clinically fluid overloaded at this point.  Given 3 DuoNeb's and some steroids with improvement in his symptoms and I do believe he likely has primarily a pulmonary etiology of his symptoms but it does behoove him to follow-up with CHF clinic as an outpatient.  He will be discharged home with bronchodilators, steroids, azithromycin, and heart failure clinic.  The patient verbalizes understanding and agreement with the plan.      ____________________________________________   FINAL CLINICAL IMPRESSION(S) / ED DIAGNOSES  Final diagnoses:  COPD exacerbation (HCC)  Congestive heart failure, unspecified HF chronicity, unspecified heart failure type (HCC)      NEW MEDICATIONS STARTED DURING THIS VISIT:  Discharge Medication List as of 08/05/2017  1:34 AM    START taking these medications    Details  albuterol (PROVENTIL HFA;VENTOLIN HFA) 108 (90 Base) MCG/ACT inhaler Inhale 2 puffs into the lungs every 6 (six) hours as needed for wheezing or shortness of breath., Starting Sun 08/05/2017, Print    azithromycin (ZITHROMAX Z-PAK) 250 MG tablet Take 2 tablets (500 mg) on  Day 1,  followed by 1 tablet (250 mg) once daily on Days 2 through 5., Print    predniSONE (DELTASONE) 50 MG tablet Take 1 tablet (50 mg total) by mouth daily for 4 days., Starting Sun 08/05/2017, Until Thu 08/09/2017, Print    Spacer/Aero Chamber Mouthpiece MISC 1 Units by Does not apply route every 4 (four) hours as needed (wheezing)., Starting Sun 08/05/2017, Print         Note:  This document was prepared using Dragon voice recognition software and may include unintentional dictation errors.     Merrily Brittle, MD 08/06/17 [phone]

## 2017-08-05 LAB — BRAIN NATRIURETIC PEPTIDE: B NATRIURETIC PEPTIDE 5: 726 pg/mL — AB (ref 0.0–100.0)

## 2017-08-05 LAB — TROPONIN I: TROPONIN I: 0.04 ng/mL — AB (ref <0.03)

## 2017-08-05 MED ORDER — IPRATROPIUM-ALBUTEROL 0.5-2.5 (3) MG/3ML IN SOLN
3.0000 mL | Freq: Once | RESPIRATORY_TRACT | Status: AC
  Administered 2017-08-05: 3 mL via RESPIRATORY_TRACT
  Filled 2017-08-05: qty 3

## 2017-08-05 MED ORDER — SPACER/AERO CHAMBER MOUTHPIECE MISC: 1.0000 [IU] | 0 refills | Status: DC | PRN

## 2017-08-05 MED ORDER — AZITHROMYCIN 250 MG PO TABS: ORAL_TABLET | ORAL | 0 refills | Status: AC

## 2017-08-05 MED ORDER — ALBUTEROL SULFATE HFA 108 (90 BASE) MCG/ACT IN AERS: 2.0000 | INHALATION_SPRAY | Freq: Four times a day (QID) | RESPIRATORY_TRACT | 0 refills | Status: DC | PRN

## 2017-08-05 MED ORDER — METHYLPREDNISOLONE SODIUM SUCC 125 MG IJ SOLR
125.0000 mg | Freq: Once | INTRAMUSCULAR | Status: AC
  Administered 2017-08-05: 125 mg via INTRAVENOUS
  Filled 2017-08-05: qty 2

## 2017-08-05 MED ORDER — PREDNISONE 50 MG PO TABS: 50.0000 mg | ORAL_TABLET | Freq: Every day | ORAL | 0 refills | Status: AC

## 2017-08-05 NOTE — Discharge Instructions (Signed)
It was a pleasure to take care of you today, and thank you for coming to our emergency department.  If you have any questions or concerns before leaving please ask the nurse to grab me and I'm more than happy to go through your aftercare instructions again.  If you were prescribed any opioid pain medication today such as Norco, Vicodin, Percocet, morphine, hydrocodone, or oxycodone please make sure you do not drive when you are taking this medication as it can alter your ability to drive safely.  If you have any concerns once you are home that you are not improving or are in fact getting worse before you can make it to your follow-up appointment, please do not hesitate to call 911 and come back for further evaluation.  Merrily BrittleNeil Renae Mottley, MD  Results for orders placed or performed during the hospital encounter of 08/04/17  Basic metabolic panel  Result Value Ref Range   Sodium 142 135 - 145 mmol/L   Potassium 4.8 3.5 - 5.1 mmol/L   Chloride 111 101 - 111 mmol/L   CO2 25 22 - 32 mmol/L   Glucose, Bld 102 (H) 65 - 99 mg/dL   BUN 24 (H) 6 - 20 mg/dL   Creatinine, Ser 9.521.51 (H) 0.61 - 1.24 mg/dL   Calcium 9.3 8.9 - 84.110.3 mg/dL   GFR calc non Af Amer 44 (L) >60 mL/min   GFR calc Af Amer 51 (L) >60 mL/min   Anion gap 6 5 - 15  CBC  Result Value Ref Range   WBC 8.6 3.8 - 10.6 K/uL   RBC 4.68 4.40 - 5.90 MIL/uL   Hemoglobin 14.0 13.0 - 18.0 g/dL   HCT 32.441.8 40.140.0 - 02.752.0 %   MCV 89.4 80.0 - 100.0 fL   MCH 29.9 26.0 - 34.0 pg   MCHC 33.5 32.0 - 36.0 g/dL   RDW 25.314.7 (H) 66.411.5 - 40.314.5 %   Platelets 243 150 - 440 K/uL  Troponin I  Result Value Ref Range   Troponin I 0.04 (HH) <0.03 ng/mL  Brain natriuretic peptide  Result Value Ref Range   B Natriuretic Peptide 726.0 (H) 0.0 - 100.0 pg/mL  Troponin I  Result Value Ref Range   Troponin I 0.04 (HH) <0.03 ng/mL   Dg Chest 2 View  Result Date: 08/04/2017 CLINICAL DATA:  Shortness of breath.  Ex-smoker. EXAM: CHEST - 2 VIEW COMPARISON:  04/29/2015.  FINDINGS: Enlarged cardiac silhouette with an interval increase in size. Stable post CABG changes. Clear lungs with normal vascularity. There is some flattening of the hemidiaphragms. Mild thoracic spine degenerative changes. IMPRESSION: 1. Interval mild cardiomegaly. 2. Mild changes of COPD. Electronically Signed   By: Beckie SaltsSteven  Reid M.D.   On: 08/04/2017 22:41

## 2017-08-05 NOTE — ED Notes (Signed)
ED Provider at bedside. 

## 2017-08-21 ENCOUNTER — Emergency Department
Admission: EM | Admit: 2017-08-21 | Discharge: 2017-08-21 | Disposition: A | Discharge status: Home / Self Care | Payer: Medicare HMO | Attending: Emergency Medicine | Admitting: Emergency Medicine

## 2017-08-21 ENCOUNTER — Encounter: Payer: Self-pay | Admitting: Emergency Medicine

## 2017-08-21 ENCOUNTER — Emergency Department: Payer: Medicare HMO

## 2017-08-21 ENCOUNTER — Other Ambulatory Visit: Payer: Self-pay

## 2017-08-21 DIAGNOSIS — I2510 Atherosclerotic heart disease of native coronary artery without angina pectoris: Secondary | ICD-10-CM | POA: Insufficient documentation

## 2017-08-21 DIAGNOSIS — Z79899 Other long term (current) drug therapy: Secondary | ICD-10-CM | POA: Diagnosis not present

## 2017-08-21 DIAGNOSIS — I509 Heart failure, unspecified: Secondary | ICD-10-CM

## 2017-08-21 DIAGNOSIS — I10 Essential (primary) hypertension: Secondary | ICD-10-CM | POA: Insufficient documentation

## 2017-08-21 DIAGNOSIS — I5089 Other heart failure: Secondary | ICD-10-CM | POA: Insufficient documentation

## 2017-08-21 DIAGNOSIS — R0602 Shortness of breath: Secondary | ICD-10-CM | POA: Diagnosis present

## 2017-08-21 DIAGNOSIS — Z87891 Personal history of nicotine dependence: Secondary | ICD-10-CM | POA: Insufficient documentation

## 2017-08-21 LAB — BRAIN NATRIURETIC PEPTIDE: B NATRIURETIC PEPTIDE 5: 820 pg/mL — AB (ref 0.0–100.0)

## 2017-08-21 LAB — CBC
HEMATOCRIT: 43.7 % (ref 40.0–52.0)
HEMOGLOBIN: 14.8 g/dL (ref 13.0–18.0)
MCH: 30.5 pg (ref 26.0–34.0)
MCHC: 33.9 g/dL (ref 32.0–36.0)
MCV: 89.8 fL (ref 80.0–100.0)
Platelets: 264 10*3/uL (ref 150–440)
RBC: 4.87 MIL/uL (ref 4.40–5.90)
RDW: 14.6 % — AB (ref 11.5–14.5)
WBC: 8.1 10*3/uL (ref 3.8–10.6)

## 2017-08-21 LAB — BASIC METABOLIC PANEL
Anion gap: 8 (ref 5–15)
BUN: 23 mg/dL (ref 8–23)
CALCIUM: 8.6 mg/dL — AB (ref 8.9–10.3)
CHLORIDE: 114 mmol/L — AB (ref 98–111)
CO2: 20 mmol/L — AB (ref 22–32)
CREATININE: 1.33 mg/dL — AB (ref 0.61–1.24)
GFR calc non Af Amer: 51 mL/min — ABNORMAL LOW (ref >60)
GFR, EST AFRICAN AMERICAN: 60 mL/min — AB (ref >60)
GLUCOSE: 115 mg/dL — AB (ref 70–99)
Potassium: 4.2 mmol/L (ref 3.5–5.1)
Sodium: 142 mmol/L (ref 135–145)

## 2017-08-21 LAB — TROPONIN I: Troponin I: 0.03 ng/mL (ref <0.03)

## 2017-08-21 MED ORDER — PREDNISONE 20 MG PO TABS
60.0000 mg | ORAL_TABLET | Freq: Once | ORAL | Status: AC
  Administered 2017-08-21: 60 mg via ORAL
  Filled 2017-08-21: qty 3

## 2017-08-21 MED ORDER — IPRATROPIUM-ALBUTEROL 0.5-2.5 (3) MG/3ML IN SOLN
3.0000 mL | Freq: Once | RESPIRATORY_TRACT | Status: AC
  Administered 2017-08-21: 3 mL via RESPIRATORY_TRACT
  Filled 2017-08-21: qty 9

## 2017-08-21 MED ORDER — FUROSEMIDE 40 MG PO TABS
40.0000 mg | ORAL_TABLET | Freq: Once | ORAL | Status: AC
  Administered 2017-08-21: 40 mg via ORAL
  Filled 2017-08-21: qty 1

## 2017-08-21 MED ORDER — IPRATROPIUM-ALBUTEROL 0.5-2.5 (3) MG/3ML IN SOLN
3.0000 mL | Freq: Once | RESPIRATORY_TRACT | Status: AC
Start: 2017-08-21 — End: 2017-08-21
  Administered 2017-08-21: 3 mL via RESPIRATORY_TRACT

## 2017-08-21 MED ORDER — IPRATROPIUM-ALBUTEROL 0.5-2.5 (3) MG/3ML IN SOLN
3.0000 mL | Freq: Once | RESPIRATORY_TRACT | Status: AC
  Administered 2017-08-21: 3 mL via RESPIRATORY_TRACT

## 2017-08-21 MED ORDER — FUROSEMIDE 20 MG PO TABS: 20.0000 mg | ORAL_TABLET | Freq: Two times a day (BID) | ORAL | 0 refills | Status: DC

## 2017-08-21 NOTE — Discharge Instructions (Signed)
Please begin taking your water pill twice a day as prescribed but most critically follow-up with the heart failure clinic tomorrow for recheck and make an appointment to establish care with a cardiologist within a week.  Return to the emergency department sooner for any concerns whatsoever.  It was a pleasure to take care of you today, and thank you for coming to our emergency department.  If you have any questions or concerns before leaving please ask the nurse to grab me and I'm more than happy to go through your aftercare instructions again.  If you were prescribed any opioid pain medication today such as Norco, Vicodin, Percocet, morphine, hydrocodone, or oxycodone please make sure you do not drive when you are taking this medication as it can alter your ability to drive safely.  If you have any concerns once you are home that you are not improving or are in fact getting worse before you can make it to your follow-up appointment, please do not hesitate to call [phone] and come back for further evaluation.  Merrily Brittle, MD  Results for orders placed or performed during the hospital encounter of 08/21/17  Basic metabolic panel  Result Value Ref Range   Sodium 142 135 - 145 mmol/L   Potassium 4.2 3.5 - 5.1 mmol/L   Chloride 114 (H) 98 - 111 mmol/L   CO2 20 (L) 22 - 32 mmol/L   Glucose, Bld 115 (H) 70 - 99 mg/dL   BUN 23 8 - 23 mg/dL   Creatinine, Ser 4.09 (H) 0.61 - 1.24 mg/dL   Calcium 8.6 (L) 8.9 - 10.3 mg/dL   GFR calc non Af Amer 51 (L) >60 mL/min   GFR calc Af Amer 60 (L) >60 mL/min   Anion gap 8 5 - 15  Troponin I  Result Value Ref Range   Troponin I 0.03 (HH) <0.03 ng/mL  Brain natriuretic peptide  Result Value Ref Range   B Natriuretic Peptide 820.0 (H) 0.0 - 100.0 pg/mL  CBC  Result Value Ref Range   WBC 8.1 3.8 - 10.6 K/uL   RBC 4.87 4.40 - 5.90 MIL/uL   Hemoglobin 14.8 13.0 - 18.0 g/dL   HCT 81.1 91.4 - 78.2 %   MCV 89.8 80.0 - 100.0 fL   MCH 30.5 26.0 - 34.0 pg   MCHC 33.9 32.0 - 36.0 g/dL   RDW 95.6 (H) 21.3 - 08.6 %   Platelets 264 150 - 440 K/uL   Dg Chest 2 View  Result Date: 08/21/2017 CLINICAL DATA:  Initial evaluation for acute shortness of breath. EXAM: CHEST - 2 VIEW COMPARISON:  Prior radiograph from 08/04/2017. FINDINGS: Median sternotomy wires underlying CABG markers noted. Cardiomegaly unchanged. Mediastinal silhouette within normal limits. Lungs well inflated. Mild perihilar vascular congestion without overt pulmonary edema. Probable trace right pleural effusion. No focal infiltrates. No pneumothorax. No acute osseus abnormality. IMPRESSION: 1. Cardiomegaly with mild perihilar vascular congestion without overt pulmonary edema. 2. Trace right pleural effusion. Electronically Signed   By: Rise Mu M.D.   On: 08/21/2017 04:23   Dg Chest 2 View  Result Date: 08/04/2017 CLINICAL DATA:  Shortness of breath.  Ex-smoker. EXAM: CHEST - 2 VIEW COMPARISON:  04/29/2015. FINDINGS: Enlarged cardiac silhouette with an interval increase in size. Stable post CABG changes. Clear lungs with normal vascularity. There is some flattening of the hemidiaphragms. Mild thoracic spine degenerative changes. IMPRESSION: 1. Interval mild cardiomegaly. 2. Mild changes of COPD. Electronically Signed   By: Zada Finders.D.  On: 08/04/2017 22:41

## 2017-08-21 NOTE — ED Provider Notes (Signed)
[facility] Emergency Department Provider Note  ____________________________________________   First MD Initiated Contact with Patient 08/21/17 [phone]     (approximate)  I have reviewed the triage vital signs and the nursing notes.   HISTORY  Chief Complaint Shortness of Breath   HPI Benjamin Valentine is a 72 y.o. male who self presents to the emergency department with several days of slowly progressive shortness of breath.  Some dry cough.  He has a past medical history of congestive heart failure although his cardiologist retired about a year ago and he has not seen a cardiologist since then.  He does take carvedilol but takes no diuretics.  He sleeps on several pillows.  Mild leg swelling.  He thinks he may be gaining some weight.  I actually saw him in the hospital about 2 weeks ago when it was unclear whether this was related to heart failure or possibly new diagnosis of COPD as the patient does have a smoking history.  I treated him at that point with bronchodilators with some improvement in his symptoms.  I helped him get follow-up in the heart failure clinic however he did not show.  He does have mild to moderate aching upper chest pain nonexertional.  Nonradiating.  Nothing seems to make it better or worse.  No fevers or chills.    Past Medical History:  Diagnosis Date  . Arthritis   . Coronary artery disease   . Hypertension     There are no active problems to display for this patient.   Past Surgical History:  Procedure Laterality Date  . CARDIAC SURGERY     CABG 2002    Prior to Admission medications   Medication Sig Start Date End Date Taking? Authorizing Provider  albuterol (PROVENTIL HFA;VENTOLIN HFA) 108 (90 Base) MCG/ACT inhaler Inhale 2 puffs into the lungs every 6 (six) hours as needed for wheezing or shortness of breath. 08/05/17   Merrily Brittleifenbark, Marco Raper, MD  furosemide (LASIX) 20 MG tablet Take 1 tablet (20 mg total) by mouth 2 (two) times  daily. 08/21/17 08/21/18  Merrily Brittleifenbark, Kytzia Gienger, MD  gabapentin (NEURONTIN) 300 MG capsule Take 1 capsule (300 mg total) 2 (two) times daily by mouth. 12/26/16 01/25/17  Menshew, Charlesetta IvoryJenise V Bacon, [name]  guaiFENesin-codeine 100-10 MG/5ML syrup Take 5 mLs by mouth every 6 (six) hours as needed for cough. 04/29/15   Minna AntisPaduchowski, Kevin, MD  ibuprofen (ADVIL,MOTRIN) 600 MG tablet Take 1 tablet (600 mg total) by mouth every 6 (six) hours as needed. 12/16/16   Joni ReiningSmith, Ronald K, [name]  naproxen (NAPROSYN) 500 MG tablet Take 1 tablet (500 mg total) by mouth 2 (two) times daily with a meal. 11/14/15   Hagler, Jami L, [name]  Spacer/Aero Chamber Mouthpiece MISC 1 Units by Does not apply route every 4 (four) hours as needed (wheezing). 08/05/17   Merrily Brittleifenbark, Hadyn Azer, MD    Allergies Patient has no known allergies.  No family history on file.  Social History Social History   Tobacco Use  . Smoking status: Former Games developermoker  . Smokeless tobacco: Never Used  Substance Use Topics  . Alcohol use: No  . Drug use: No    Review of Systems Constitutional: No fever/chills Eyes: No visual changes. ENT: No sore throat. Cardiovascular: Positive for chest pain. Respiratory: Positive for shortness of breath. Gastrointestinal: No abdominal pain.  No nausea, no vomiting.  No diarrhea.  No constipation. Genitourinary: Negative for dysuria. Musculoskeletal: Negative for back pain. Skin: Negative for rash. Neurological: Negative  for headaches, focal weakness or numbness.   ____________________________________________   PHYSICAL EXAM:  VITAL SIGNS: ED Triage Vitals  Enc Vitals Group     BP 08/21/17 0351 (!) 171/91     Pulse Rate 08/21/17 0351 84     Resp 08/21/17 0351 20     Temp 08/21/17 0351 97.6 F (36.4 C)     Temp Source 08/21/17 0351 Oral     SpO2 08/21/17 0351 98 %     Weight 08/21/17 0341 240 lb (108.9 kg)     Height 08/21/17 0341 5\' 9"  (1.753 m)     Head Circumference --      Peak Flow --      Pain Score  08/21/17 0341 0     Pain Loc --      Pain Edu? --      Excl. in GC? --     Constitutional: Alert and oriented x4 appears somewhat short of breath no diaphoresis Eyes: PERRL EOMI. Head: Atraumatic. Nose: No congestion/rhinnorhea. Mouth/Throat: No trismus Neck: No stridor.  Unable to lie completely flat with some JVD Cardiovascular: Normal rate, regular rhythm. Grossly normal heart sounds.  Good peripheral circulation. Respiratory: Somewhat increased respiratory effort no accessory muscle use crackles in bilateral bases lungs otherwise clear Gastrointestinal: Soft nontender Musculoskeletal: Legs equal in size 1+ pitting edema bilaterally Neurologic:  Normal speech and language. No gross focal neurologic deficits are appreciated. Skin:  Skin is warm, dry and intact. No rash noted. Psychiatric: Mood and affect are normal. Speech and behavior are normal.    ____________________________________________   DIFFERENTIAL includes but not limited to  COPD, pneumothorax, pulmonary embolism, pulmonary edema, acute coronary syndrome ____________________________________________   LABS (all labs ordered are listed, but only abnormal results are displayed)  Labs Reviewed  BASIC METABOLIC PANEL - Abnormal; Notable for the following components:      Result Value   Chloride 114 (*)    CO2 20 (*)    Glucose, Bld 115 (*)    Creatinine, Ser 1.33 (*)    Calcium 8.6 (*)    GFR calc non Af Amer 51 (*)    GFR calc Af Amer 60 (*)    All other components within normal limits  TROPONIN I - Abnormal; Notable for the following components:   Troponin I 0.03 (*)    All other components within normal limits  BRAIN NATRIURETIC PEPTIDE - Abnormal; Notable for the following components:   B Natriuretic Peptide 820.0 (*)    All other components within normal limits  CBC - Abnormal; Notable for the following components:   RDW 14.6 (*)    All other components within normal limits    Lab work reviewed by  me with elevated BNP and troponin likely secondary to stretch __________________________________________  EKG  ED ECG REPORT I, Merrily Brittle, the attending physician, personally viewed and interpreted this ECG.  Date: 08/23/2017 EKG Time:  Rate: 81 Rhythm: normal sinus rhythm QRS Axis: normal Intervals: First-degree heart block ST/T Wave abnormalities: normal Narrative Interpretation: no evidence of acute ischemia  ____________________________________________  RADIOLOGY  Chest x-ray reviewed by me suggestive of mild fluid overload ____________________________________________   PROCEDURES  Procedure(s) performed: no  Procedures  Critical Care performed: no  ____________________________________________   INITIAL IMPRESSION / ASSESSMENT AND PLAN / ED COURSE  Pertinent labs & imaging results that were available during my care of the patient were reviewed by me and considered in my medical decision making (see chart for details).   The  patient arrives again with somewhat of a mixed picture and it is unclear if this is pulmonary versus cardiac.  X-ray does suggest more of a cardiac etiology.  I will initiate him on some Lasix now along with several breathing treatments and reevaluate.  Following Lasix and albuterol the patient feels improved.  I will help him get follow-up with both cardiology and heart failure clinic.  He is discharged home in improved condition and I will initiate him on low-dose Lasix for now.  Strict return precautions have been given and the patient verbalizes understanding and agreement with the plan.      ____________________________________________   FINAL CLINICAL IMPRESSION(S) / ED DIAGNOSES  Final diagnoses:  Acute on chronic congestive heart failure, unspecified heart failure type (HCC)      NEW MEDICATIONS STARTED DURING THIS VISIT:  Discharge Medication List as of 08/21/2017  6:42 AM    START taking these medications   Details    furosemide (LASIX) 20 MG tablet Take 1 tablet (20 mg total) by mouth 2 (two) times daily., Starting Tue 08/21/2017, Until Wed 08/21/2018, Print         Note:  This document was prepared using Dragon voice recognition software and may include unintentional dictation errors.     Merrily Brittle, MD 08/23/17 1429

## 2017-08-21 NOTE — ED Triage Notes (Signed)
Patient ambulatory to triage with steady gait, without difficulty or distress noted; pt reports SHOB several days; seen for same but doesn't know what he was told; pt denies pain, denies cough

## 2017-08-21 NOTE — ED Notes (Signed)

## 2017-09-06 ENCOUNTER — Ambulatory Visit: Payer: Medicare HMO | Admitting: Family

## 2017-10-26 ENCOUNTER — Emergency Department
Admission: EM | Admit: 2017-10-26 | Discharge: 2017-10-26 | Disposition: A | Discharge status: Home / Self Care | Payer: Medicare HMO | Attending: Emergency Medicine | Admitting: Emergency Medicine

## 2017-10-26 ENCOUNTER — Emergency Department: Payer: Medicare HMO

## 2017-10-26 ENCOUNTER — Other Ambulatory Visit: Payer: Self-pay

## 2017-10-26 ENCOUNTER — Encounter: Payer: Self-pay | Admitting: Emergency Medicine

## 2017-10-26 DIAGNOSIS — L03319 Cellulitis of trunk, unspecified: Secondary | ICD-10-CM

## 2017-10-26 DIAGNOSIS — I10 Essential (primary) hypertension: Secondary | ICD-10-CM | POA: Diagnosis not present

## 2017-10-26 DIAGNOSIS — L02219 Cutaneous abscess of trunk, unspecified: Secondary | ICD-10-CM

## 2017-10-26 DIAGNOSIS — Z87891 Personal history of nicotine dependence: Secondary | ICD-10-CM | POA: Insufficient documentation

## 2017-10-26 DIAGNOSIS — Z951 Presence of aortocoronary bypass graft: Secondary | ICD-10-CM | POA: Insufficient documentation

## 2017-10-26 DIAGNOSIS — Z79899 Other long term (current) drug therapy: Secondary | ICD-10-CM | POA: Insufficient documentation

## 2017-10-26 DIAGNOSIS — L02214 Cutaneous abscess of groin: Secondary | ICD-10-CM | POA: Insufficient documentation

## 2017-10-26 DIAGNOSIS — I2510 Atherosclerotic heart disease of native coronary artery without angina pectoris: Secondary | ICD-10-CM | POA: Insufficient documentation

## 2017-10-26 DIAGNOSIS — L03314 Cellulitis of groin: Secondary | ICD-10-CM | POA: Diagnosis not present

## 2017-10-26 DIAGNOSIS — R531 Weakness: Secondary | ICD-10-CM | POA: Diagnosis present

## 2017-10-26 LAB — URINALYSIS, COMPLETE (UACMP) WITH MICROSCOPIC
BACTERIA UA: NONE SEEN
BILIRUBIN URINE: NEGATIVE
Glucose, UA: NEGATIVE mg/dL
Hgb urine dipstick: NEGATIVE
Ketones, ur: NEGATIVE mg/dL
LEUKOCYTES UA: NEGATIVE
Nitrite: NEGATIVE
Protein, ur: 30 mg/dL — AB
SPECIFIC GRAVITY, URINE: 1.019 (ref 1.005–1.030)
pH: 5 (ref 5.0–8.0)

## 2017-10-26 LAB — COMPREHENSIVE METABOLIC PANEL
ALBUMIN: 4.1 g/dL (ref 3.5–5.0)
ALK PHOS: 67 U/L (ref 38–126)
ALT: 11 U/L (ref 0–44)
ANION GAP: 11 (ref 5–15)
AST: 14 U/L — ABNORMAL LOW (ref 15–41)
BUN: 15 mg/dL (ref 8–23)
CALCIUM: 9.2 mg/dL (ref 8.9–10.3)
CO2: 20 mmol/L — AB (ref 22–32)
Chloride: 103 mmol/L (ref 98–111)
Creatinine, Ser: 1.17 mg/dL (ref 0.61–1.24)
GFR calc Af Amer: >60 mL/min (ref >60)
GFR calc non Af Amer: >60 mL/min (ref >60)
GLUCOSE: 141 mg/dL — AB (ref 70–99)
Potassium: 4 mmol/L (ref 3.5–5.1)
SODIUM: 134 mmol/L — AB (ref 135–145)
Total Bilirubin: 1.5 mg/dL — ABNORMAL HIGH (ref 0.3–1.2)
Total Protein: 8.7 g/dL — ABNORMAL HIGH (ref 6.5–8.1)

## 2017-10-26 LAB — CBC
HEMATOCRIT: 46.9 % (ref 40.0–52.0)
Hemoglobin: 16.2 g/dL (ref 13.0–18.0)
MCH: 30.8 pg (ref 26.0–34.0)
MCHC: 34.5 g/dL (ref 32.0–36.0)
MCV: 89.2 fL (ref 80.0–100.0)
Platelets: 248 10*3/uL (ref 150–440)
RBC: 5.26 MIL/uL (ref 4.40–5.90)
RDW: 15.3 % — AB (ref 11.5–14.5)
WBC: 16.6 10*3/uL — ABNORMAL HIGH (ref 3.8–10.6)

## 2017-10-26 LAB — TROPONIN I: Troponin I: 0.03 ng/mL (ref <0.03)

## 2017-10-26 LAB — LACTIC ACID, PLASMA: Lactic Acid, Venous: 0.9 mmol/L (ref 0.5–1.9)

## 2017-10-26 MED ORDER — LIDOCAINE HCL (PF) 1 % IJ SOLN
INTRAMUSCULAR | Status: AC
  Filled 2017-10-26: qty 5

## 2017-10-26 MED ORDER — CLINDAMYCIN PHOSPHATE 600 MG/50ML IV SOLN
600.0000 mg | Freq: Once | INTRAVENOUS | Status: AC
  Administered 2017-10-26: 600 mg via INTRAVENOUS
  Filled 2017-10-26: qty 50

## 2017-10-26 MED ORDER — SODIUM CHLORIDE 0.9 % IV BOLUS
1000.0000 mL | Freq: Once | INTRAVENOUS | Status: AC
  Administered 2017-10-26: 1000 mL via INTRAVENOUS

## 2017-10-26 MED ORDER — CLINDAMYCIN HCL 300 MG PO CAPS: 300.0000 mg | ORAL_CAPSULE | Freq: Three times a day (TID) | ORAL | 0 refills | Status: AC

## 2017-10-26 NOTE — Discharge Instructions (Addendum)
Take the antibiotic as prescribed and finish the full course.  Make sure to eat frequently and drink plenty of fluids.  Return to the ER for new, worsening, persistent severe pain, rash, swelling, high fevers, weakness, vomiting or if you cannot take the medication, or any other new or worsening symptoms that concern you.

## 2017-10-26 NOTE — ED Provider Notes (Signed)
[facility] Emergency Department Provider Note ____________________________________________   First MD Initiated Contact with Patient 10/26/17 1547     (approximate)  I have reviewed the triage vital signs and the nursing notes.   HISTORY  Chief Complaint Weakness    HPI Benjamin Valentine is a 72 y.o. male with PMH as noted below who presents with generalized weakness over the last several days, gradual onset, persistent course, associated with some lightheadedness and shortness of breath, as well as with chills.  The patient states that he has developed a rash to his left groin over the last several days also.  He denies vomiting or diarrhea, or any urinary symptoms.  Past Medical History:  Diagnosis Date  . Arthritis   . Coronary artery disease   . Hypertension     There are no active problems to display for this patient.   Past Surgical History:  Procedure Laterality Date  . CARDIAC SURGERY     CABG 2002    Prior to Admission medications   Medication Sig Start Date End Date Taking? Authorizing Provider  carvedilol (COREG) 6.25 MG tablet Take 6.25 mg by mouth 2 (two) times daily with a meal. 08/23/17  Yes [provider]  COLCRYS 0.6 MG tablet Take 1 tablet by mouth daily. 09/09/17  Yes [provider]  furosemide (LASIX) 20 MG tablet Take 1 tablet (20 mg total) by mouth 2 (two) times daily. Patient taking differently: Take 40 mg by mouth daily.  08/21/17 08/21/18 Yes Merrily Brittle, MD  nabumetone (RELAFEN) 750 MG tablet Take 1 tablet by mouth 2 (two) times daily. 08/26/17  Yes [provider]  albuterol (PROVENTIL HFA;VENTOLIN HFA) 108 (90 Base) MCG/ACT inhaler Inhale 2 puffs into the lungs every 6 (six) hours as needed for wheezing or shortness of breath. 08/05/17   Merrily Brittle, MD  clindamycin (CLEOCIN) 300 MG capsule Take 1 capsule (300 mg total) by mouth 3 (three) times daily for 10 days. 10/27/17 11/06/17  Dionne Bucy, MD  gabapentin (NEURONTIN) 300 MG capsule Take 1 capsule (300 mg total) 2 (two) times daily by mouth. 12/26/16 01/25/17  Menshew, Charlesetta Ivory, [name]  guaiFENesin-codeine 100-10 MG/5ML syrup Take 5 mLs by mouth every 6 (six) hours as needed for cough. Patient not taking: Reported on 10/26/2017 04/29/15   Minna Antis, MD  ibuprofen (ADVIL,MOTRIN) 600 MG tablet Take 1 tablet (600 mg total) by mouth every 6 (six) hours as needed. Patient not taking: Reported on 10/26/2017 12/16/16   Joni Reining, [name]  naproxen (NAPROSYN) 500 MG tablet Take 1 tablet (500 mg total) by mouth 2 (two) times daily with a meal. Patient not taking: Reported on 10/26/2017 11/14/15   Hagler, Ernestene Kiel, [name]  Spacer/Aero Chamber Mouthpiece MISC 1 Units by Does not apply route every 4 (four) hours as needed (wheezing). 08/05/17   Merrily Brittle, MD    Allergies Patient has no known allergies.  No family history on file.  Social History Social History   Tobacco Use  . Smoking status: Former Games developer  . Smokeless tobacco: Never Used  Substance Use Topics  . Alcohol use: No  . Drug use: No    Review of Systems  Constitutional: Positive for chills and weakness. Eyes: No redness. ENT: No sore throat. Cardiovascular: Denies chest pain. Respiratory: Positive for shortness of breath. Gastrointestinal: No vomiting or diarrhea.  Genitourinary: Negative for dysuria.  Musculoskeletal: Negative for back pain. Skin: Positive for rash Neurological: Negative for headache.  ____________________________________________   PHYSICAL EXAM:  VITAL SIGNS: ED Triage Vitals  Enc Vitals Group     BP 10/26/17 1517 (!) 142/73     Pulse Rate 10/26/17 1517 98     Resp 10/26/17 1517 18     Temp 10/26/17 1517 99.7 F (37.6 C)     Temp Source 10/26/17 1517 Oral     SpO2 10/26/17 1517 96 %     Weight 10/26/17 1518 175 lb (79.4 kg)     Height 10/26/17 1518 5\' 9"  (1.753 m)     Head Circumference --      Peak Flow  --      Pain Score 10/26/17 1518 0     Pain Loc --      Pain Edu? --      Excl. in GC? --     Constitutional: Alert and oriented.  Relatively comfortable appearing and in no acute distress. Eyes: Conjunctivae are normal.  Head: Atraumatic. Nose: No congestion/rhinnorhea. Mouth/Throat: Mucous membranes are dry. Neck: Normal range of motion.  Cardiovascular: Normal rate, regular rhythm. Grossly normal heart sounds.  Good peripheral circulation. Respiratory: Normal respiratory effort.  No retractions. Lungs CTAB. Gastrointestinal: Soft and nontender. No distention.  Genitourinary: No flank tenderness. Musculoskeletal: No lower extremity edema.  Extremities warm and well perfused.  Neurologic:  Normal speech and language. No gross focal neurologic deficits are appreciated.  Skin:  Skin is warm and dry.  Left inguinal area with erythema and induration, and approximately 3 x 6 cm area of fluctuance just lateral to the inguinal crease. Psychiatric: Mood and affect are normal. Speech and behavior are normal.  ____________________________________________   LABS (all labs ordered are listed, but only abnormal results are displayed)  Labs Reviewed  CBC - Abnormal; Notable for the following components:      Result Value   WBC 16.6 (*)    RDW 15.3 (*)    All other components within normal limits  URINALYSIS, COMPLETE (UACMP) WITH MICROSCOPIC - Abnormal; Notable for the following components:   Color, Urine YELLOW (*)    APPearance CLEAR (*)    Protein, ur 30 (*)    All other components within normal limits  COMPREHENSIVE METABOLIC PANEL - Abnormal; Notable for the following components:   Sodium 134 (*)    CO2 20 (*)    Glucose, Bld 141 (*)    Total Protein 8.7 (*)    AST 14 (*)    Total Bilirubin 1.5 (*)    All other components within normal limits  TROPONIN I - Abnormal; Notable for the following components:   Troponin I 0.03 (*)    All other components within normal limits    CULTURE, BLOOD (ROUTINE X 2)  CULTURE, BLOOD (ROUTINE X 2)  LACTIC ACID, PLASMA   ____________________________________________  EKG  ED ECG REPORT I, Dionne Bucy, the attending physician, personally viewed and interpreted this ECG.  Date: 10/26/2017 EKG Time: 1520 Rate: 100 Rhythm: normal sinus rhythm QRS Axis: normal Intervals: normal ST/T Wave abnormalities: LVH, nonspecific lateral T wave abnormality Narrative Interpretation: Nonspecific lateral abnormalities with no significant change when compared to EKG of 08/21/2017  ____________________________________________  RADIOLOGY  CXR: No focal infiltrate  ____________________________________________   PROCEDURES  Procedure(s) performed: Yes  .Marland KitchenIncision and Drainage Date/Time: 10/26/2017 5:09 PM Performed by: Dionne Bucy, MD Authorized by: Dionne Bucy, MD   Consent:    Consent obtained:  Verbal   Consent given by:  Patient   Risks discussed:  Bleeding, infection, incomplete  drainage and pain   Alternatives discussed:  Alternative treatment, delayed treatment and observation Location:    Type:  Abscess   Size:  6cm   Location:  Trunk Pre-procedure details:    Skin preparation:  Chloraprep Anesthesia (see MAR for exact dosages):    Anesthesia method:  Local infiltration   Local anesthetic:  Lidocaine 1% w/o epi Procedure type:    Complexity:  Complex Procedure details:    Incision types:  Single straight   Incision depth:  Dermal   Wound management:  Probed and deloculated   Drainage:  Purulent   Drainage amount:  Moderate   Wound treatment:  Wound left open   Packing materials:  None Post-procedure details:    Patient tolerance of procedure:  Tolerated well, no immediate complications    Critical Care performed: No ____________________________________________   INITIAL IMPRESSION / ASSESSMENT AND PLAN / ED COURSE  Pertinent labs & imaging results that were available during  my care of the patient were reviewed by me and considered in my medical decision making (see chart for details).  72 year old male with PMH as noted above presents with generalized weakness and chills over the last several days as well as a left groin rash.  On exam, he is relatively comfortable appearing with borderline temperature but otherwise normal vital signs.  The exam is primarily remarkable for an area of erythema and induration, with fluctuance lateral to the left inguinal crease.  Overall I am most concerned for acute cellulitis/abscess causing the patient's generalized symptoms.  Differential also includes pneumonia, UTI, or viral infection, or less likely cardiac etiology, or electrolyte abnormality other metabolic cause.  Bedside ultrasound performed by me confirms presence of fluid consistent with abscess.  Plan: I&D of the abscess, labs, CXR, UA, fluids and IV antibiotics, and reassess.  ----------------------------------------- 8:07 PM on 10/26/2017 -----------------------------------------  I&D performed successfully.  The patient's lab work-up reveals elevated WBC consistent with infection but no other concerning findings.  His troponin is 0.03 which is consistent with his baseline.  Chest x-ray and UA were negative.  The patient's vital signs have remained stable.  Clindamycin was given in the ED via IV.  The patient is stable for discharge home at this time.  I will prescribe a 10-day course of clindamycin.  I counseled the patient on the results of the work-up, the likely causes of his symptoms, and the plan of care.  Return precautions given, and he expresses understanding. ____________________________________________   FINAL CLINICAL IMPRESSION(S) / ED DIAGNOSES  Final diagnoses:  Cellulitis and abscess of trunk      NEW MEDICATIONS STARTED DURING THIS VISIT:  New Prescriptions   CLINDAMYCIN (CLEOCIN) 300 MG CAPSULE    Take 1 capsule (300 mg total) by mouth 3  (three) times daily for 10 days.     Note:  This document was prepared using Dragon voice recognition software and may include unintentional dictation errors.    Dionne BucySiadecki, Cote Mayabb, MD 10/26/17 2009

## 2017-10-26 NOTE — ED Triage Notes (Addendum)
General weakness x 3 days. Also states has had rash L groin.

## 2017-10-31 LAB — CULTURE, BLOOD (ROUTINE X 2)
CULTURE: NO GROWTH
CULTURE: NO GROWTH

## 2017-11-13 DIAGNOSIS — M1712 Unilateral primary osteoarthritis, left knee: Secondary | ICD-10-CM | POA: Diagnosis not present

## 2017-11-13 DIAGNOSIS — I2510 Atherosclerotic heart disease of native coronary artery without angina pectoris: Secondary | ICD-10-CM | POA: Diagnosis not present

## 2017-11-13 DIAGNOSIS — J439 Emphysema, unspecified: Secondary | ICD-10-CM | POA: Diagnosis not present

## 2017-11-13 DIAGNOSIS — B029 Zoster without complications: Secondary | ICD-10-CM | POA: Diagnosis not present

## 2017-11-20 DIAGNOSIS — M792 Neuralgia and neuritis, unspecified: Secondary | ICD-10-CM | POA: Diagnosis not present

## 2017-11-20 DIAGNOSIS — R079 Chest pain, unspecified: Secondary | ICD-10-CM | POA: Diagnosis not present

## 2017-11-20 DIAGNOSIS — J439 Emphysema, unspecified: Secondary | ICD-10-CM | POA: Diagnosis not present

## 2017-11-20 DIAGNOSIS — I25709 Atherosclerosis of coronary artery bypass graft(s), unspecified, with unspecified angina pectoris: Secondary | ICD-10-CM | POA: Diagnosis not present

## 2017-11-20 DIAGNOSIS — B029 Zoster without complications: Secondary | ICD-10-CM | POA: Diagnosis not present

## 2017-11-26 DIAGNOSIS — Z951 Presence of aortocoronary bypass graft: Secondary | ICD-10-CM | POA: Diagnosis not present

## 2017-11-26 DIAGNOSIS — M109 Gout, unspecified: Secondary | ICD-10-CM | POA: Diagnosis not present

## 2017-11-26 DIAGNOSIS — M792 Neuralgia and neuritis, unspecified: Secondary | ICD-10-CM | POA: Diagnosis not present

## 2017-11-26 DIAGNOSIS — B029 Zoster without complications: Secondary | ICD-10-CM | POA: Diagnosis not present

## 2017-11-26 DIAGNOSIS — I2510 Atherosclerotic heart disease of native coronary artery without angina pectoris: Secondary | ICD-10-CM | POA: Diagnosis not present

## 2017-11-26 DIAGNOSIS — I10 Essential (primary) hypertension: Secondary | ICD-10-CM | POA: Diagnosis not present

## 2017-11-26 DIAGNOSIS — Z6829 Body mass index (BMI) 29.0-29.9, adult: Secondary | ICD-10-CM | POA: Diagnosis not present

## 2017-11-26 DIAGNOSIS — I25709 Atherosclerosis of coronary artery bypass graft(s), unspecified, with unspecified angina pectoris: Secondary | ICD-10-CM | POA: Diagnosis not present

## 2017-11-26 DIAGNOSIS — E663 Overweight: Secondary | ICD-10-CM | POA: Diagnosis not present

## 2018-06-06 ENCOUNTER — Other Ambulatory Visit: Payer: Self-pay

## 2018-06-06 ENCOUNTER — Encounter: Payer: Self-pay | Admitting: Emergency Medicine

## 2018-06-06 ENCOUNTER — Emergency Department
Admission: EM | Admit: 2018-06-06 | Discharge: 2018-06-06 | Disposition: A | Discharge status: Home / Self Care | Payer: Medicare HMO | Attending: Emergency Medicine | Admitting: Emergency Medicine

## 2018-06-06 ENCOUNTER — Emergency Department: Payer: Medicare HMO

## 2018-06-06 DIAGNOSIS — Z87891 Personal history of nicotine dependence: Secondary | ICD-10-CM | POA: Insufficient documentation

## 2018-06-06 DIAGNOSIS — I10 Essential (primary) hypertension: Secondary | ICD-10-CM | POA: Diagnosis not present

## 2018-06-06 DIAGNOSIS — Z20828 Contact with and (suspected) exposure to other viral communicable diseases: Secondary | ICD-10-CM | POA: Insufficient documentation

## 2018-06-06 DIAGNOSIS — I2510 Atherosclerotic heart disease of native coronary artery without angina pectoris: Secondary | ICD-10-CM | POA: Insufficient documentation

## 2018-06-06 DIAGNOSIS — R0602 Shortness of breath: Secondary | ICD-10-CM | POA: Diagnosis not present

## 2018-06-06 DIAGNOSIS — R05 Cough: Secondary | ICD-10-CM | POA: Diagnosis not present

## 2018-06-06 LAB — BASIC METABOLIC PANEL WITH GFR
Anion gap: 7 (ref 5–15)
BUN: 19 mg/dL (ref 8–23)
CO2: 26 mmol/L (ref 22–32)
Calcium: 9.1 mg/dL (ref 8.9–10.3)
Chloride: 107 mmol/L (ref 98–111)
Creatinine, Ser: 1.28 mg/dL — ABNORMAL HIGH (ref 0.61–1.24)
GFR calc Af Amer: >60 mL/min
GFR calc non Af Amer: 55 mL/min — ABNORMAL LOW
Glucose, Bld: 115 mg/dL — ABNORMAL HIGH (ref 70–99)
Potassium: 4 mmol/L (ref 3.5–5.1)
Sodium: 140 mmol/L (ref 135–145)

## 2018-06-06 LAB — CBC WITH DIFFERENTIAL/PLATELET
Abs Immature Granulocytes: 0.02 K/uL (ref 0.00–0.07)
Basophils Absolute: 0 K/uL (ref 0.0–0.1)
Basophils Relative: 1 %
Eosinophils Absolute: 0.2 K/uL (ref 0.0–0.5)
Eosinophils Relative: 3 %
HCT: 44.3 % (ref 39.0–52.0)
Hemoglobin: 14.3 g/dL (ref 13.0–17.0)
Immature Granulocytes: 0 %
Lymphocytes Relative: 33 %
Lymphs Abs: 2.2 K/uL (ref 0.7–4.0)
MCH: 29.3 pg (ref 26.0–34.0)
MCHC: 32.3 g/dL (ref 30.0–36.0)
MCV: 90.8 fL (ref 80.0–100.0)
Monocytes Absolute: 0.7 K/uL (ref 0.1–1.0)
Monocytes Relative: 10 %
Neutro Abs: 3.4 K/uL (ref 1.7–7.7)
Neutrophils Relative %: 53 %
Platelets: 251 K/uL (ref 150–400)
RBC: 4.88 MIL/uL (ref 4.22–5.81)
RDW: 14.1 % (ref 11.5–15.5)
WBC: 6.5 K/uL (ref 4.0–10.5)
nRBC: 0 % (ref 0.0–0.2)

## 2018-06-06 LAB — TROPONIN I: Troponin I: 0.03 ng/mL

## 2018-06-06 LAB — BRAIN NATRIURETIC PEPTIDE: B Natriuretic Peptide: 451 pg/mL — ABNORMAL HIGH (ref 0.0–100.0)

## 2018-06-06 MED ORDER — PREDNISONE 50 MG PO TABS: ORAL_TABLET | ORAL | 0 refills | Status: DC

## 2018-06-06 MED ORDER — ALBUTEROL SULFATE HFA 108 (90 BASE) MCG/ACT IN AERS: 2.0000 | INHALATION_SPRAY | Freq: Four times a day (QID) | RESPIRATORY_TRACT | 2 refills | Status: DC | PRN

## 2018-06-06 MED ORDER — IPRATROPIUM-ALBUTEROL 0.5-2.5 (3) MG/3ML IN SOLN
3.0000 mL | Freq: Once | RESPIRATORY_TRACT | Status: AC
  Administered 2018-06-06: 3 mL via RESPIRATORY_TRACT
  Filled 2018-06-06: qty 3

## 2018-06-06 NOTE — ED Triage Notes (Signed)
PT c/o SOB and cough x 2wks. Pt ambulatory, speaking in full sentences.

## 2018-06-06 NOTE — ED Notes (Signed)
Pt daughter called, inquiring about patient medical results, explained that due to [place] laws I would have to get approval from patient. Pt refused to allow this RN to give information to daughter and refused to call daughter back.

## 2018-06-06 NOTE — ED Provider Notes (Signed)
[facility] Emergency Department Provider Note       Time seen: ----------------------------------------- 11:37 AM on 06/06/2018 -----------------------------------------   I have reviewed the triage vital signs and the nursing notes.  HISTORY   Chief Complaint Shortness of Breath    HPI Benjamin Valentine is a 73 y.o. male with a history of arthritis, coronary disease, hypertension who presents to the ED for shortness of breath.  Patient describes shortness of breath with some cough for the past several weeks.  He has had some chills as well.  He denies fever, chest pain, vomiting or diarrhea.  Patient states that is worse when he lays down at night.  Past Medical History:  Diagnosis Date  . Arthritis   . Coronary artery disease   . Hypertension     There are no active problems to display for this patient.   Past Surgical History:  Procedure Laterality Date  . CARDIAC SURGERY     CABG 2002    Allergies Patient has no known allergies.  Social History Social History   Tobacco Use  . Smoking status: Former Games developermoker  . Smokeless tobacco: Never Used  Substance Use Topics  . Alcohol use: No  . Drug use: No   Review of Systems Constitutional: Negative for fever.  Positive for chills Cardiovascular: Negative for chest pain. Respiratory: Positive for shortness of breath Gastrointestinal: Negative for abdominal pain, vomiting and diarrhea. Musculoskeletal: Negative for back pain. Skin: Negative for rash. Neurological: Negative for headaches, focal weakness or numbness.  All systems negative/normal/unremarkable except as stated in the HPI  ____________________________________________   PHYSICAL EXAM:  VITAL SIGNS: ED Triage Vitals [06/06/18 1120]  Enc Vitals Group     BP (!) 147/115     Pulse Rate 96     Resp 20     Temp 98 F (36.7 C)     Temp Source Oral     SpO2 100 %     Weight      Height      Head Circumference      Peak  Flow      Pain Score      Pain Loc      Pain Edu?      Excl. in GC?     Constitutional: Alert and oriented. Well appearing and in no distress. Eyes: Conjunctivae are normal. Normal extraocular movements. ENT      Head: Normocephalic and atraumatic.      Nose: No congestion/rhinnorhea.      Mouth/Throat: Mucous membranes are moist.      Neck: No stridor. Cardiovascular: Normal rate, regular rhythm. No murmurs, rubs, or gallops. Respiratory: Normal respiratory effort without tachypnea nor retractions.  Bilateral wheezing is noted, worse on the left Gastrointestinal: Soft and nontender. Normal bowel sounds Musculoskeletal: Nontender with normal range of motion in extremities. No lower extremity tenderness nor edema. Neurologic:  Normal speech and language. No gross focal neurologic deficits are appreciated.  Skin:  Skin is warm, dry and intact. No rash noted. Psychiatric: Mood and affect are normal. Speech and behavior are normal.  ____________________________________________  EKG: Interpreted by me.  Sinus rhythm with first-degree AV block, LVH, T wave abnormalities, normal QT  ____________________________________________  ED COURSE:  As part of my medical decision making, I reviewed the following data within the electronic MEDICAL RECORD NUMBER History obtained from family if available, nursing notes, old chart and ekg, as well as notes from prior ED visits. Patient presented for shortness of breath  and cough, we will assess with labs and imaging as indicated at this time.   Procedures  AZURIAH DESOCIO was evaluated in Emergency Department on 06/06/2018 for the symptoms described in the history of present illness. He was evaluated in the context of the global COVID-19 pandemic, which necessitated consideration that the patient might be at risk for infection with the SARS-CoV-2 virus that causes COVID-19. Institutional protocols and algorithms that pertain to the evaluation of patients at  risk for COVID-19 are in a state of rapid change based on information released by regulatory bodies including the CDC and federal and state organizations. These policies and algorithms were followed during the patient's care in the ED.  ____________________________________________   LABS (pertinent positives/negatives)  Labs Reviewed  BASIC METABOLIC PANEL - Abnormal; Notable for the following components:      Result Value   Glucose, Bld 115 (*)    Creatinine, Ser 1.28 (*)    GFR calc non Af Amer 55 (*)    All other components within normal limits  BRAIN NATRIURETIC PEPTIDE - Abnormal; Notable for the following components:   B Natriuretic Peptide 451.0 (*)    All other components within normal limits  TROPONIN I - Abnormal; Notable for the following components:   Troponin I 0.03 (*)    All other components within normal limits  NOVEL CORONAVIRUS, NAA (HOSPITAL ORDER, SEND-OUT TO REF LAB)  CBC WITH DIFFERENTIAL/PLATELET    RADIOLOGY Images were viewed by me  Chest x-ray IMPRESSION: No active disease. ____________________________________________   DIFFERENTIAL DIAGNOSIS   CHF, COPD, pneumonia, coronavirus  FINAL ASSESSMENT AND PLAN  Shortness of breath   Plan: The patient had presented for dyspnea. Patient's labs did not reveal any acute process, he has a chronically elevated troponin. Patient's imaging is negative.  He was given a breathing treatment here, we have sent for the send out testing for coronavirus.  He will be discharged on a short burst of steroids as well as albuterol.  I will also encourage doubling Lasix for 48 hours.   Ulice Dash, MD    Note: This note was generated in part or whole with voice recognition software. Voice recognition is usually quite accurate but there are transcription errors that can and very often do occur. I apologize for any typographical errors that were not detected and corrected.     Emily Filbert, MD 06/06/18  1246

## 2018-06-06 NOTE — ED Notes (Signed)
Granddaughter called regarding pt's difficulty w/ breathing @ night. Assured granddaughter that the pt was able to provide adequate health history and discharge was planned for 30 mins from discussion.

## 2018-06-07 ENCOUNTER — Telehealth: Payer: Self-pay | Admitting: Emergency Medicine

## 2018-06-07 LAB — NOVEL CORONAVIRUS, NAA (HOSP ORDER, SEND-OUT TO REF LAB; TAT 18-24 HRS): SARS-CoV-2, NAA: NOT DETECTED

## 2018-06-07 NOTE — Telephone Encounter (Addendum)
Called patient to inform of covid 19 test negative.  No answer and voicemail is full. He called me back and I gave him result.  Explained that he still needs to follow up with pcp.  He says he has appt.

## 2018-06-10 DIAGNOSIS — I509 Heart failure, unspecified: Secondary | ICD-10-CM | POA: Diagnosis not present

## 2018-06-10 DIAGNOSIS — R42 Dizziness and giddiness: Secondary | ICD-10-CM | POA: Diagnosis not present

## 2018-06-10 DIAGNOSIS — M792 Neuralgia and neuritis, unspecified: Secondary | ICD-10-CM | POA: Diagnosis not present

## 2018-06-10 DIAGNOSIS — I25709 Atherosclerosis of coronary artery bypass graft(s), unspecified, with unspecified angina pectoris: Secondary | ICD-10-CM | POA: Diagnosis not present

## 2018-08-11 ENCOUNTER — Observation Stay
Admission: EM | Admit: 2018-08-11 | Discharge: 2018-08-12 | Disposition: A | Discharge status: Home / Self Care | Payer: Medicare HMO | Attending: Surgery | Admitting: Surgery

## 2018-08-11 ENCOUNTER — Observation Stay: Payer: Medicare HMO | Admitting: Certified Registered"

## 2018-08-11 ENCOUNTER — Emergency Department: Payer: Medicare HMO

## 2018-08-11 ENCOUNTER — Other Ambulatory Visit: Payer: Self-pay

## 2018-08-11 ENCOUNTER — Encounter: Payer: Self-pay | Admitting: Radiology

## 2018-08-11 ENCOUNTER — Encounter
Admission: EM | Disposition: A | Discharge status: Home / Self Care | Payer: Self-pay | Source: Home / Self Care | Attending: Emergency Medicine

## 2018-08-11 DIAGNOSIS — Z87891 Personal history of nicotine dependence: Secondary | ICD-10-CM | POA: Insufficient documentation

## 2018-08-11 DIAGNOSIS — I2510 Atherosclerotic heart disease of native coronary artery without angina pectoris: Secondary | ICD-10-CM | POA: Insufficient documentation

## 2018-08-11 DIAGNOSIS — K4090 Unilateral inguinal hernia, without obstruction or gangrene, not specified as recurrent: Secondary | ICD-10-CM | POA: Diagnosis not present

## 2018-08-11 DIAGNOSIS — I700 Atherosclerosis of aorta: Secondary | ICD-10-CM | POA: Insufficient documentation

## 2018-08-11 DIAGNOSIS — R079 Chest pain, unspecified: Secondary | ICD-10-CM | POA: Diagnosis not present

## 2018-08-11 DIAGNOSIS — Z7982 Long term (current) use of aspirin: Secondary | ICD-10-CM | POA: Diagnosis not present

## 2018-08-11 DIAGNOSIS — Z791 Long term (current) use of non-steroidal anti-inflammatories (NSAID): Secondary | ICD-10-CM | POA: Diagnosis not present

## 2018-08-11 DIAGNOSIS — I10 Essential (primary) hypertension: Secondary | ICD-10-CM | POA: Diagnosis not present

## 2018-08-11 DIAGNOSIS — J439 Emphysema, unspecified: Secondary | ICD-10-CM | POA: Insufficient documentation

## 2018-08-11 DIAGNOSIS — Z951 Presence of aortocoronary bypass graft: Secondary | ICD-10-CM | POA: Diagnosis not present

## 2018-08-11 DIAGNOSIS — K3589 Other acute appendicitis without perforation or gangrene: Principal | ICD-10-CM | POA: Insufficient documentation

## 2018-08-11 DIAGNOSIS — Z79899 Other long term (current) drug therapy: Secondary | ICD-10-CM | POA: Diagnosis not present

## 2018-08-11 DIAGNOSIS — R109 Unspecified abdominal pain: Secondary | ICD-10-CM | POA: Diagnosis not present

## 2018-08-11 DIAGNOSIS — Q272 Other congenital malformations of renal artery: Secondary | ICD-10-CM | POA: Insufficient documentation

## 2018-08-11 DIAGNOSIS — M1990 Unspecified osteoarthritis, unspecified site: Secondary | ICD-10-CM | POA: Insufficient documentation

## 2018-08-11 DIAGNOSIS — R1032 Left lower quadrant pain: Secondary | ICD-10-CM | POA: Diagnosis not present

## 2018-08-11 DIAGNOSIS — Z20828 Contact with and (suspected) exposure to other viral communicable diseases: Secondary | ICD-10-CM | POA: Diagnosis not present

## 2018-08-11 DIAGNOSIS — Z1159 Encounter for screening for other viral diseases: Secondary | ICD-10-CM | POA: Diagnosis not present

## 2018-08-11 DIAGNOSIS — K3580 Unspecified acute appendicitis: Secondary | ICD-10-CM | POA: Diagnosis present

## 2018-08-11 HISTORY — PX: LAPAROSCOPIC APPENDECTOMY: SHX408

## 2018-08-11 LAB — COMPREHENSIVE METABOLIC PANEL
ALT: 16 U/L (ref 0–44)
AST: 19 U/L (ref 15–41)
Albumin: 4.1 g/dL (ref 3.5–5.0)
Alkaline Phosphatase: 56 U/L (ref 38–126)
Anion gap: 8 (ref 5–15)
BUN: 16 mg/dL (ref 8–23)
CO2: 24 mmol/L (ref 22–32)
Calcium: 8.9 mg/dL (ref 8.9–10.3)
Chloride: 108 mmol/L (ref 98–111)
Creatinine, Ser: 1.18 mg/dL (ref 0.61–1.24)
GFR calc Af Amer: >60 mL/min (ref >60)
GFR calc non Af Amer: >60 mL/min (ref >60)
Glucose, Bld: 150 mg/dL — ABNORMAL HIGH (ref 70–99)
Potassium: 4.3 mmol/L (ref 3.5–5.1)
Sodium: 140 mmol/L (ref 135–145)
Total Bilirubin: 0.8 mg/dL (ref 0.3–1.2)
Total Protein: 7.6 g/dL (ref 6.5–8.1)

## 2018-08-11 LAB — CBC
HCT: 45.7 % (ref 39.0–52.0)
Hemoglobin: 15 g/dL (ref 13.0–17.0)
MCH: 29.4 pg (ref 26.0–34.0)
MCHC: 32.8 g/dL (ref 30.0–36.0)
MCV: 89.6 fL (ref 80.0–100.0)
Platelets: 271 10*3/uL (ref 150–400)
RBC: 5.1 MIL/uL (ref 4.22–5.81)
RDW: 13.6 % (ref 11.5–15.5)
WBC: 11.2 10*3/uL — ABNORMAL HIGH (ref 4.0–10.5)
nRBC: 0 % (ref 0.0–0.2)

## 2018-08-11 LAB — TROPONIN I (HIGH SENSITIVITY)
Troponin I (High Sensitivity): 18 ng/L — ABNORMAL HIGH (ref <18)
Troponin I (High Sensitivity): 15 ng/L (ref <18)

## 2018-08-11 LAB — SARS CORONAVIRUS 2 BY RT PCR (HOSPITAL ORDER, PERFORMED IN ~~LOC~~ HOSPITAL LAB): SARS Coronavirus 2: NEGATIVE

## 2018-08-11 LAB — LIPASE, BLOOD: Lipase: 36 U/L (ref 11–51)

## 2018-08-11 LAB — LACTIC ACID, PLASMA: Lactic Acid, Venous: 1 mmol/L (ref 0.5–1.9)

## 2018-08-11 SURGERY — APPENDECTOMY, LAPAROSCOPIC
Anesthesia: General

## 2018-08-11 MED ORDER — PROPOFOL 10 MG/ML IV BOLUS
INTRAVENOUS | Status: AC
  Filled 2018-08-11: qty 20

## 2018-08-11 MED ORDER — CARVEDILOL 6.25 MG PO TABS
6.2500 mg | ORAL_TABLET | Freq: Two times a day (BID) | ORAL | Status: DC
  Administered 2018-08-11 – 2018-08-12 (×2): 6.25 mg via ORAL
  Filled 2018-08-11 (×2): qty 1

## 2018-08-11 MED ORDER — SODIUM CHLORIDE 0.9 % IV SOLN
INTRAVENOUS | Status: DC
  Administered 2018-08-11 (×4): via INTRAVENOUS

## 2018-08-11 MED ORDER — ONDANSETRON HCL 4 MG/2ML IJ SOLN
4.0000 mg | Freq: Four times a day (QID) | INTRAMUSCULAR | Status: DC | PRN
  Administered 2018-08-11: 4 mg via INTRAVENOUS

## 2018-08-11 MED ORDER — ONDANSETRON 4 MG PO TBDP: 4.0000 mg | ORAL_TABLET | Freq: Four times a day (QID) | ORAL | Status: DC | PRN

## 2018-08-11 MED ORDER — SODIUM CHLORIDE 0.9 % IV SOLN
INTRAVENOUS | Status: DC | PRN
  Administered 2018-08-11: 250 mL via INTRAVENOUS

## 2018-08-11 MED ORDER — SODIUM CHLORIDE 0.9 % IV BOLUS
500.0000 mL | Freq: Once | INTRAVENOUS | Status: AC
  Administered 2018-08-11: 500 mL via INTRAVENOUS

## 2018-08-11 MED ORDER — PANTOPRAZOLE SODIUM 40 MG IV SOLR
40.0000 mg | Freq: Every day | INTRAVENOUS | Status: DC
  Administered 2018-08-11: 40 mg via INTRAVENOUS
  Filled 2018-08-11: qty 40

## 2018-08-11 MED ORDER — FENTANYL CITRATE (PF) 100 MCG/2ML IJ SOLN: 25.0000 ug | INTRAMUSCULAR | Status: DC | PRN

## 2018-08-11 MED ORDER — LACTATED RINGERS IV SOLN
INTRAVENOUS | Status: DC | PRN
  Administered 2018-08-11: 19:00:00 via INTRAVENOUS

## 2018-08-11 MED ORDER — ALBUTEROL SULFATE HFA 108 (90 BASE) MCG/ACT IN AERS: 2.0000 | INHALATION_SPRAY | Freq: Four times a day (QID) | RESPIRATORY_TRACT | Status: DC | PRN

## 2018-08-11 MED ORDER — FUROSEMIDE 40 MG PO TABS
40.0000 mg | ORAL_TABLET | Freq: Every day | ORAL | Status: DC
  Administered 2018-08-12: 40 mg via ORAL
  Filled 2018-08-11: qty 1

## 2018-08-11 MED ORDER — FENTANYL CITRATE (PF) 100 MCG/2ML IJ SOLN
INTRAMUSCULAR | Status: DC | PRN
  Administered 2018-08-11 (×2): 50 ug via INTRAVENOUS

## 2018-08-11 MED ORDER — FENTANYL CITRATE (PF) 100 MCG/2ML IJ SOLN
INTRAMUSCULAR | Status: AC
  Filled 2018-08-11: qty 2

## 2018-08-11 MED ORDER — HYDROMORPHONE HCL 1 MG/ML IJ SOLN: 0.5000 mg | INTRAMUSCULAR | Status: DC | PRN

## 2018-08-11 MED ORDER — ACETAMINOPHEN 500 MG PO TABS: 1000.0000 mg | ORAL_TABLET | Freq: Four times a day (QID) | ORAL | Status: DC | PRN

## 2018-08-11 MED ORDER — DEXAMETHASONE SODIUM PHOSPHATE 10 MG/ML IJ SOLN
INTRAMUSCULAR | Status: DC | PRN
  Administered 2018-08-11: 10 mg via INTRAVENOUS

## 2018-08-11 MED ORDER — SUGAMMADEX SODIUM 200 MG/2ML IV SOLN
INTRAVENOUS | Status: DC | PRN
  Administered 2018-08-11: 186 mg via INTRAVENOUS

## 2018-08-11 MED ORDER — BUPIVACAINE-EPINEPHRINE (PF) 0.5% -1:200000 IJ SOLN
INTRAMUSCULAR | Status: AC
  Filled 2018-08-11: qty 30

## 2018-08-11 MED ORDER — ALBUTEROL SULFATE (2.5 MG/3ML) 0.083% IN NEBU: 2.5000 mg | INHALATION_SOLUTION | Freq: Four times a day (QID) | RESPIRATORY_TRACT | Status: DC | PRN

## 2018-08-11 MED ORDER — ONDANSETRON HCL 4 MG/2ML IJ SOLN
4.0000 mg | Freq: Once | INTRAMUSCULAR | Status: AC
  Administered 2018-08-11: 4 mg via INTRAVENOUS
  Filled 2018-08-11: qty 2

## 2018-08-11 MED ORDER — LIDOCAINE HCL (CARDIAC) PF 100 MG/5ML IV SOSY
PREFILLED_SYRINGE | INTRAVENOUS | Status: DC | PRN
  Administered 2018-08-11: 50 mg via INTRAVENOUS

## 2018-08-11 MED ORDER — IOPAMIDOL (ISOVUE-370) INJECTION 76%
100.0000 mL | Freq: Once | INTRAVENOUS | Status: AC | PRN
  Administered 2018-08-11: 100 mL via INTRAVENOUS

## 2018-08-11 MED ORDER — MORPHINE SULFATE (PF) 2 MG/ML IV SOLN
2.0000 mg | Freq: Once | INTRAVENOUS | Status: AC
  Administered 2018-08-11: 2 mg via INTRAVENOUS
  Filled 2018-08-11: qty 1

## 2018-08-11 MED ORDER — PIPERACILLIN-TAZOBACTAM 3.375 G IVPB
3.3750 g | Freq: Three times a day (TID) | INTRAVENOUS | Status: DC
  Administered 2018-08-11 – 2018-08-12 (×3): 3.375 g via INTRAVENOUS
  Filled 2018-08-11 (×3): qty 50

## 2018-08-11 MED ORDER — ONDANSETRON HCL 4 MG/2ML IJ SOLN: 4.0000 mg | Freq: Once | INTRAMUSCULAR | Status: DC | PRN

## 2018-08-11 MED ORDER — MORPHINE SULFATE (PF) 4 MG/ML IV SOLN
4.0000 mg | Freq: Once | INTRAVENOUS | Status: AC
  Administered 2018-08-11: 4 mg via INTRAVENOUS
  Filled 2018-08-11: qty 1

## 2018-08-11 MED ORDER — KETOROLAC TROMETHAMINE 30 MG/ML IJ SOLN
15.0000 mg | Freq: Four times a day (QID) | INTRAMUSCULAR | Status: DC
  Administered 2018-08-11 – 2018-08-12 (×3): 15 mg via INTRAVENOUS
  Filled 2018-08-11 (×3): qty 1

## 2018-08-11 MED ORDER — OXYCODONE HCL 5 MG PO TABS: 5.0000 mg | ORAL_TABLET | ORAL | Status: DC | PRN

## 2018-08-11 MED ORDER — ROCURONIUM BROMIDE 100 MG/10ML IV SOLN
INTRAVENOUS | Status: DC | PRN
  Administered 2018-08-11: 10 mg via INTRAVENOUS
  Administered 2018-08-11: 40 mg via INTRAVENOUS

## 2018-08-11 MED ORDER — PROPOFOL 10 MG/ML IV BOLUS
INTRAVENOUS | Status: DC | PRN
  Administered 2018-08-11: 150 mg via INTRAVENOUS

## 2018-08-11 MED ORDER — ENOXAPARIN SODIUM 40 MG/0.4ML ~~LOC~~ SOLN
40.0000 mg | SUBCUTANEOUS | Status: DC
  Filled 2018-08-11: qty 0.4

## 2018-08-11 MED ORDER — ENALAPRIL MALEATE 20 MG PO TABS
20.0000 mg | ORAL_TABLET | Freq: Every day | ORAL | Status: DC
  Administered 2018-08-12: 20 mg via ORAL
  Filled 2018-08-11: qty 1

## 2018-08-11 SURGICAL SUPPLY — 38 items
CANISTER SUCT 1200ML W/VALVE (MISCELLANEOUS) ×3 IMPLANT
CHLORAPREP W/TINT 26 (MISCELLANEOUS) ×3 IMPLANT
COVER WAND RF STERILE (DRAPES) ×3 IMPLANT
CUTTER FLEX LINEAR 45M (STAPLE) IMPLANT
DERMABOND ADVANCED (GAUZE/BANDAGES/DRESSINGS) ×2
DERMABOND ADVANCED .7 DNX12 (GAUZE/BANDAGES/DRESSINGS) ×1 IMPLANT
ELECT CAUTERY BLADE 6.4 (BLADE) ×3 IMPLANT
ELECT REM PT RETURN 9FT ADLT (ELECTROSURGICAL) ×3
ELECTRODE REM PT RTRN 9FT ADLT (ELECTROSURGICAL) ×1 IMPLANT
GLOVE SURG SYN 7.0 (GLOVE) ×3 IMPLANT
GLOVE SURG SYN 7.5  E (GLOVE) ×2
GLOVE SURG SYN 7.5 E (GLOVE) ×1 IMPLANT
GOWN STRL REUS W/ TWL LRG LVL3 (GOWN DISPOSABLE) ×2 IMPLANT
GOWN STRL REUS W/TWL LRG LVL3 (GOWN DISPOSABLE) ×4
IRRIGATION STRYKERFLOW (MISCELLANEOUS) IMPLANT
IRRIGATOR STRYKERFLOW (MISCELLANEOUS)
IV NS 1000ML (IV SOLUTION) ×2
IV NS 1000ML BAXH (IV SOLUTION) ×1 IMPLANT
KIT TURNOVER KIT A (KITS) ×3 IMPLANT
LABEL OR SOLS (LABEL) ×3 IMPLANT
LIGASURE LAP MARYLAND 5MM 37CM (ELECTROSURGICAL) IMPLANT
NEEDLE HYPO 22GX1.5 SAFETY (NEEDLE) ×3 IMPLANT
NS IRRIG 500ML POUR BTL (IV SOLUTION) ×3 IMPLANT
PACK LAP CHOLECYSTECTOMY (MISCELLANEOUS) ×3 IMPLANT
PENCIL ELECTRO HAND CTR (MISCELLANEOUS) ×3 IMPLANT
POUCH SPECIMEN RETRIEVAL 10MM (ENDOMECHANICALS) ×3 IMPLANT
RELOAD 45 VASCULAR/THIN (ENDOMECHANICALS) IMPLANT
RELOAD STAPLE TA45 3.5 REG BLU (ENDOMECHANICALS) ×3 IMPLANT
SCISSORS METZENBAUM CVD 33 (INSTRUMENTS) ×3 IMPLANT
SLEEVE ADV FIXATION 5X100MM (TROCAR) ×6 IMPLANT
SUT MNCRL 4-0 (SUTURE) ×2
SUT MNCRL 4-0 27XMFL (SUTURE) ×1
SUT VICRYL 0 AB UR-6 (SUTURE) ×3 IMPLANT
SUTURE MNCRL 4-0 27XMF (SUTURE) ×1 IMPLANT
TRAY FOLEY MTR SLVR 16FR STAT (SET/KITS/TRAYS/PACK) ×3 IMPLANT
TROCAR BALLN GELPORT 12X130M (ENDOMECHANICALS) ×3 IMPLANT
TROCAR Z-THREAD OPTICAL 5X100M (TROCAR) ×3 IMPLANT
TUBING EVAC SMOKE HEATED PNEUM (TUBING) ×3 IMPLANT

## 2018-08-11 NOTE — Consult Note (Addendum)
Wrong chart

## 2018-08-11 NOTE — Plan of Care (Signed)

## 2018-08-11 NOTE — Transfer of Care (Signed)
Immediate Anesthesia Transfer of Care Note  Patient: Benjamin Valentine  Procedure(s) Performed: APPENDECTOMY LAPAROSCOPIC (N/A )  Patient Location: PACU  Anesthesia Type:General  Level of Consciousness: awake  Airway & Oxygen Therapy: Patient Spontanous Breathing  Post-op Assessment: Report given to RN  Post vital signs: Reviewed  Last Vitals:  Vitals Value Taken Time  BP 109/68 08/11/18 2014  Temp 36.4 C 08/11/18 2014  Pulse 74 08/11/18 2021  Resp 15 08/11/18 2021  SpO2 94 % 08/11/18 2021  Vitals shown include unvalidated device data.  Last Pain:  Vitals:   08/11/18 2014  TempSrc:   PainSc: 0-No pain         Complications: No apparent anesthesia complications

## 2018-08-11 NOTE — Anesthesia Postprocedure Evaluation (Signed)
Anesthesia Post Note  Patient: Benjamin Valentine  Procedure(s) Performed: APPENDECTOMY LAPAROSCOPIC (N/A )  Patient location during evaluation: PACU Anesthesia Type: General Level of consciousness: awake and alert Pain management: pain level controlled Vital Signs Assessment: post-procedure vital signs reviewed and stable Respiratory status: spontaneous breathing and respiratory function stable Cardiovascular status: stable Anesthetic complications: no     Last Vitals:  Vitals:   08/11/18 2014 08/11/18 2029  BP: 109/68 114/65  Pulse: 73 73  Resp: 14 17  Temp: (!) 36.4 C   SpO2: 93% 92%    Last Pain:  Vitals:   08/11/18 2029  TempSrc:   PainSc: 0-No pain                 KEPHART,WILLIAM K

## 2018-08-11 NOTE — ED Triage Notes (Signed)
Pt states that he started having left sided abd pain that radiates up into the left side of his chest. Some vomiting and diarrhea. No distress noted

## 2018-08-11 NOTE — H&P (Signed)
Date of Admission:  08/11/2018  Reason for Admission:  Acute appendicitis  History of Present Illness: Benjamin Valentine is a 73 y.o. male presenting with new onset abdominal pain since last night.  Patient reports that yesterday he was doing well until about midnight when the pain started.  He feels the pain is in the left abdomen.  Associated with nausea and episodes of emesis.  Denies any fevers but had chills around 2 AM.  Pain does not radiate.  He presented to the ED and his workup included labs and CT scan.  His labs show a WBC of 11.2, but otherwise unremarkable labs.  He had CTA which I have independently viewed and agree with findings, which show acute appendicitis.  There is also of note a left inguinal hernia, which contains a portion of his bladder.  He reports that for voiding, sometimes it helps to push on his hernia, which he has had for many years now.     Past Medical History: Past Medical History:  Diagnosis Date  . Arthritis   . Coronary artery disease   . Hypertension      Past Surgical History: Past Surgical History:  Procedure Laterality Date  . CARDIAC SURGERY     CABG 2002    Home Medications: Prior to Admission medications   Medication Sig Start Date End Date Taking? Authorizing Provider  albuterol (VENTOLIN HFA) 108 (90 Base) MCG/ACT inhaler Inhale 2 puffs into the lungs every 6 (six) hours as needed for wheezing or shortness of breath. 06/06/18  Yes Emily FilbertWilliams, Jonathan E, MD  aspirin EC 81 MG tablet Take 81 mg by mouth daily.   Yes [provider]  carvedilol (COREG) 6.25 MG tablet Take 6.25 mg by mouth 2 (two) times daily with a meal. 08/23/17  Yes [provider]  enalapril (VASOTEC) 20 MG tablet Take 20 mg by mouth daily. 06/06/18  Yes [provider]  furosemide (LASIX) 20 MG tablet Take 1 tablet (20 mg total) by mouth 2 (two) times daily. Patient taking differently: Take 40 mg by mouth daily.  08/21/17 08/21/18 Yes Merrily Brittleifenbark, Neil, MD   naproxen sodium (ALEVE) 220 MG tablet Take 220-440 mg by mouth 2 (two) times daily as needed (pain).   Yes [provider]  predniSONE (DELTASONE) 50 MG tablet Take 1 tablet by mouth daily Patient not taking: Reported on 08/11/2018 06/06/18   Emily FilbertWilliams, Jonathan E, MD    Allergies: No Known Allergies  Social History:  reports that he has quit smoking. He has never used smokeless tobacco. He reports that he does not drink alcohol or use drugs.   Family History: No family history on file.  Review of Systems: Review of Systems  Constitutional: Negative for chills and fever.  HENT: Negative for hearing loss.   Respiratory: Positive for shortness of breath. Hemoptysis: only with exertion.   Cardiovascular: Negative for chest pain.  Gastrointestinal: Positive for abdominal pain, nausea and vomiting. Negative for constipation and diarrhea.  Genitourinary: Negative for dysuria.  Musculoskeletal: Negative for myalgias.  Neurological: Negative for dizziness.  Psychiatric/Behavioral: Negative for depression.    Physical Exam BP (!) 153/76 (BP Location: Left Arm)   Pulse 93   Temp 99.8 F (37.7 C) (Oral)   Resp 16   Ht 5\' 9"  (1.753 m)   Wt 93 kg   SpO2 98%   BMI 30.28 kg/m  CONSTITUTIONAL: No acute distress HEENT:  Normocephalic, atraumatic, extraocular motion intact. NECK: Trachea is midline, and there is no  jugular venous distension.  RESPIRATORY:  Lungs are clear, and breath sounds are equal bilaterally. Normal respiratory effort without pathologic use of accessory muscles. CARDIOVASCULAR: Heart is regular without murmurs, gallops, or rubs. GI: The abdomen is soft, non-distended, with some discomfort to palpation over left abdomen, but more tenderness in the mid to right abdomen where his appendix is on CT scan.  He has a left inguinal hernia which is reducible and non-tender. MUSCULOSKELETAL:  Normal muscle strength and tone in all four extremities.  No peripheral edema or  cyanosis. SKIN: Skin turgor is normal. There are no pathologic skin lesions.  NEUROLOGIC:  Motor and sensation is grossly normal.  Cranial nerves are grossly intact. PSYCH:  Alert and oriented to person, place and time. Affect is normal.  Laboratory Analysis: Results for orders placed or performed during the hospital encounter of 08/11/18 (from the past 24 hour(s))  Lipase, blood     Status: None   Collection Time: 08/11/18  8:15 AM  Result Value Ref Range   Lipase 36 11 - 51 U/L  Comprehensive metabolic panel     Status: Abnormal   Collection Time: 08/11/18  8:15 AM  Result Value Ref Range   Sodium 140 135 - 145 mmol/L   Potassium 4.3 3.5 - 5.1 mmol/L   Chloride 108 98 - 111 mmol/L   CO2 24 22 - 32 mmol/L   Glucose, Bld 150 (H) 70 - 99 mg/dL   BUN 16 8 - 23 mg/dL   Creatinine, Ser 7.821.18 0.61 - 1.24 mg/dL   Calcium 8.9 8.9 - 95.610.3 mg/dL   Total Protein 7.6 6.5 - 8.1 g/dL   Albumin 4.1 3.5 - 5.0 g/dL   AST 19 15 - 41 U/L   ALT 16 0 - 44 U/L   Alkaline Phosphatase 56 38 - 126 U/L   Total Bilirubin 0.8 0.3 - 1.2 mg/dL   GFR calc non Af Amer >60 >60 mL/min   GFR calc Af Amer >60 >60 mL/min   Anion gap 8 5 - 15  CBC     Status: Abnormal   Collection Time: 08/11/18  8:15 AM  Result Value Ref Range   WBC 11.2 (H) 4.0 - 10.5 K/uL   RBC 5.10 4.22 - 5.81 MIL/uL   Hemoglobin 15.0 13.0 - 17.0 g/dL   HCT 21.345.7 08.639.0 - 57.852.0 %   MCV 89.6 80.0 - 100.0 fL   MCH 29.4 26.0 - 34.0 pg   MCHC 32.8 30.0 - 36.0 g/dL   RDW 46.913.6 62.911.5 - 52.815.5 %   Platelets 271 150 - 400 K/uL   nRBC 0.0 0.0 - 0.2 %  Lactic acid, plasma     Status: None   Collection Time: 08/11/18  8:15 AM  Result Value Ref Range   Lactic Acid, Venous 1.0 0.5 - 1.9 mmol/L  Troponin I (High Sensitivity)     Status: Abnormal   Collection Time: 08/11/18  8:15 AM  Result Value Ref Range   Troponin I (High Sensitivity) 18 (H) <18 ng/L  Troponin I (High Sensitivity)     Status: None   Collection Time: 08/11/18 11:48 AM  Result Value  Ref Range   Troponin I (High Sensitivity) 15 <18 ng/L  SARS Coronavirus 2 (CEPHEID - Performed [facility] lab), Hosp Order     Status: None   Collection Time: 08/11/18 11:48 AM   Specimen: Nasopharyngeal Swab  Result Value Ref Range   SARS Coronavirus 2 NEGATIVE NEGATIVE    Imaging:  Ct Angio Chest/abd/pel For Dissection W And/or Wo Contrast  Result Date: 08/11/2018 CLINICAL DATA:  73 year old male with a history of chest pain and abdominal pain EXAM: CT ANGIOGRAPHY CHEST, ABDOMEN AND PELVIS TECHNIQUE: Multidetector CT imaging through the chest, abdomen and pelvis was performed using the standard protocol during bolus administration of intravenous contrast. Multiplanar reconstructed images and MIPs were obtained and reviewed to evaluate the vascular anatomy. CONTRAST:  100mL ISOVUE-370 IOPAMIDOL (ISOVUE-370) INJECTION 76% COMPARISON:  No prior CT FINDINGS: CTA CHEST FINDINGS Cardiovascular: Heart: Cardiomegaly. No pericardial fluid/thickening. Dense calcifications of native coronary arteries. Surgical changes of median sternotomy and CABG. Calcifications of the aortic arch valve. Aorta: The noncontrast chest CT demonstrates no hyperdense crescent within the thoracic aorta. No aneurysm. No periaortic fluid or inflammatory changes. Postcontrast images demonstrate no dissection flap of the aorta. Mild to moderate atherosclerotic changes of the aorta. Two vessel arch with a common origin of the innominate air artery and the left common carotid artery. Bilateral brachial artery patent. The cervical arteries at the base of the neck are patent. Pulmonary arteries: No central, lobar, segmental, or proximal subsegmental filling defects. Mediastinum/Nodes: Small lymph nodes of the mediastinum. Unremarkable appearance of the thoracic esophagus. Unremarkable thoracic inlet Lungs/Pleura: Minimal debris within the right mainstem bronchus and left mainstem bronchus. No pleural effusion. No confluent  airspace disease. No pneumothorax. Calcified granuloma within the right upper lobe on image 69 of series 6. Mild centrilobular and paraseptal emphysema. No bronchial wall thickening. Review of the MIP images confirms the above findings. CTA ABDOMEN AND PELVIS FINDINGS VASCULAR Aorta: Unremarkable course, caliber, contour of the abdominal aorta. No dissection, aneurysm, or periaortic fluid. Mild to moderate atherosclerotic changes of the abdominal aorta. Celiac: Less than 50% narrowing at the origin of the celiac artery with poststenotic dilation likely secondary to overlying diaphragmatic cruise. Branches are patent. SMA: SMA patent with mild atherosclerotic changes at the origin. Renals: Mild atherosclerotic changes at the origin the bilateral renal arteries. No high-grade stenosis on the right. On the left the left main renal artery demonstrates at least 50% narrowing at the origin. There is accessory renal artery to the lower pole. IMA: IMA remains patent, likely stenotic at the origin. Right lower extremity: Unremarkable course, caliber, and contour of the right iliac system. No aneurysm, dissection, or occlusion. Hypogastric artery is patent. Anterior and posterior division patent. Common femoral artery patent. Proximal SFA and profunda femoris patent. Moderate atherosclerotic changes of the right iliac and proximal femoral system. Left lower extremity: Unremarkable course, caliber, and contour of the left iliac system. No aneurysm, dissection, or occlusion. Hypogastric artery is patent. Anterior and posterior division patent. Common femoral artery patent. Proximal SFA and profunda femoris patent. Moderate atherosclerotic changes of the left iliac and proximal femoral system. Veins: Unremarkable appearance of the venous system. Review of the MIP images confirms the above findings. NON-VASCULAR Hepatobiliary: Unremarkable appearance of the liver. Unremarkable gall bladder. Pancreas: Unremarkable pancreas Spleen:  Unremarkable Adrenals/Urinary Tract: Unremarkable Right: No hydronephrosis. Symmetric perfusion to the left. No nephrolithiasis. Unremarkable course of the right ureter. Left: No hydronephrosis. Symmetric perfusion to the right. No nephrolithiasis. Unremarkable course of the left ureter. The left dome of the urinary bladder is entrapped within left inguinal hernia. No significant inflammatory changes. Stomach/Bowel: Unremarkable appearance of the stomach. Unremarkable appearance of small bowel. No evidence of obstruction. Thickened and fluid-filled appendix with inflammatory changes at the margin. No free air. No focal fluid. No fecalith. No comparison CT. Colonic diverticula without evidence of acute  diverticulitis. Lymphatic: No lymphadenopathy. Mesenteric: No free air or free fluid.  No mesenteric adenopathy Reproductive: Prostate measures 5.5 cm Other: Left inguinal hernia containing left dome of urinary bladder. Musculoskeletal: Degenerative changes of the thoracolumbar spine. Vacuum disc phenomenon at L3-L4, L4-L5, L5-S1. no bony canal narrowing. Degenerative changes of the bilateral hips. No aggressive lucent or sclerotic lesions. No displaced fracture identified. Review of the MIP images confirms the above findings. IMPRESSION: No acute arterial abnormality, with no evidence of acute aortic syndrome. Early appendicitis is suspected without complicating features. These results were discussed by telephone at the time of interpretation on 08/11/2018 at 10:19 am with Dr. Delman Kitten. Aortic Atherosclerosis (ICD10-I70.0). Associated native coronary artery disease and surgical changes of CABG. Emphysema (ICD10-J43.9). No evidence of pneumonia, although there is minimal debris within the right and left mainstem bronchi at the carina, potentially secondary to poor cough response or aspiration. The left dome of the urinary bladder is entrapped within a left inguinal hernia. Additional ancillary findings as above.  Signed, Dulcy Fanny. Dellia Nims, RPVI [facility] Electronically Signed   By: Corrie Mckusick D.O.   On: 08/11/2018 10:21    Assessment and Plan: This is a 73 y.o. male with acute appendicitis.  Discussed with the patient that he has acute appendicitis.  Though he initially described discomfort in the left abdomen, on exam, his tenderness is more on the mid to right abdomen in the lower portion.  This is consistent with where his appendix is on CT scan.  Though he does have a left inguinal hernia, this is chronic in nature and there is no acute pathology on CT scan to indicate strangulation, and on exam it is reducible.  Patient will be admitted to surgical team.  Will be NPO with IV fluid hydration, appropriate pain and nausea control.  Will be started on IV antibiotics.  Will proceed with laparoscopic appendectomy today pending OR availability.  Discussed with him the risks of bleeding, infection, and injury to surrounding structures, and he's willing to proceed.   Melvyn Neth, MD [facility]g:  [phone]

## 2018-08-11 NOTE — ED Notes (Signed)
Pt states pain started yesterday around 12- left sided stomach pain- complains of n/v/d

## 2018-08-11 NOTE — Anesthesia Post-op Follow-up Note (Signed)
Anesthesia QCDR form completed.        

## 2018-08-11 NOTE — Op Note (Signed)
  Procedure Date:  08/11/2018  Pre-operative Diagnosis:  Acute appendicitis  Post-operative Diagnosis:  Acute appendicitis  Procedure:  Laparoscopic appendectomy  Surgeon:  Melvyn Neth, MD  Anesthesia:  General endotracheal  Estimated Blood Loss:  5 ml  Specimens:  appendix  Complications:  None  Indications for Procedure:  This is a 73 y.o. male who presents with abdominal pain and workup revealing acute appendicitis.  The options of surgery versus observation were reviewed with the patient and/or family. The risks of bleeding, infection, recurrence of symptoms, negative laparoscopy, potential for an open procedure, bowel injury, abscess or infection, were all discussed with the patient and he was willing to proceed.  Description of Procedure: The patient was correctly identified in the preoperative area and brought into the operating room.  The patient was placed supine with VTE prophylaxis in place.  Appropriate time-outs were performed.  Anesthesia was induced and the patient was intubated.  Foley catheter was placed.  Appropriate antibiotics were infused.  The abdomen was prepped and draped in a sterile fashion. An infraumbilical incision was made. A cutdown technique was used to enter the abdominal cavity without injury, and a Hasson trocar was inserted.  Pneumoperitoneum was obtained with appropriate opening pressures.  Two 5-mm ports were placed in the suprapubic and left lateral positions under direct visualization.  The right lower quadrant was inspected and the appendix was identified and found to be acutely inflamed but not perforated.  The appendix was carefully dissected.  The mesoappendix was divided using the LigaSure.  The base of the appendix was dissected out and divided with a standard load Endo GIA.  The appendix was placed in an Endocatch bag.  The right lower quadrant was then inspected again revealing an intact staple line, no bleeding, and no bowel  injury.  The 5 mm ports were removed under direct visualization and the Hasson trocar was removed.  The Endocatch bag was brought out through the umbilical incision.  The fascial opening was closed using 0 vicryl suture.  Local anesthetic was infused in all incisions and the incisions were closed with 4-0 Monocryl.  The wounds were cleaned and sealed with DermaBond.  Foley catheter was removed and the patient was emerged from anesthesia and extubated and brought to the recovery room for further management.  The patient tolerated the procedure well and all counts were correct at the end of the case.   Melvyn Neth, MD

## 2018-08-11 NOTE — ED Notes (Signed)
Patient transported to CT 

## 2018-08-11 NOTE — ED Notes (Signed)
ED TO INPATIENT HANDOFF REPORT  ED Nurse Name and Phone #:  Selena BattenKim [phone]  S Name/Age/Gender Irena ReichmannJames C Sehgal 73 y.o. male Room/Bed: ED04A/ED04A  Code Status   Code Status: Full Code  Home/SNF/Other Home Patient oriented to: self, place, time and situation Is this baseline? Yes      Chief Complaint abd pain  Triage Note Pt states that he started having left sided abd pain that radiates up into the left side of his chest. Some vomiting and diarrhea. No distress noted   Allergies No Known Allergies  Level of Care/Admitting Diagnosis ED Disposition    ED Disposition Condition Comment   Admit  Hospital Area: [facility] [[id number]]  Level of Care: Med-Surg [16]  Covid Evaluation: N/A  Diagnosis: Acute appendicitis [[id number]][[id number]]  Admitting Physician: Henrene DodgeISCOYA, [name] [[id number]]  Attending Physician: [name], [name] [[id number]]  PT Class (Do Not Modify): Observation [104]  PT Acc Code (Do Not Modify): Observation [10022]       B Medical/Surgery History Past Medical History:  Diagnosis Date  . Arthritis   . Coronary artery disease   . Hypertension    Past Surgical History:  Procedure Laterality Date  . CARDIAC SURGERY     CABG 2002     A IV Location/Drains/Wounds Patient Lines/Drains/Airways Status   Active Line/Drains/Airways    Name:   Placement date:   Placement time:   Site:   Days:   Peripheral IV 08/11/18 Left Antecubital   08/11/18    0814    Antecubital   less than 1          Intake/Output Last 24 hours  Intake/Output Summary (Last 24 hours) at 08/11/2018 1307 Last data filed at 08/11/2018 [id number] Gross per 24 hour  Intake 500 ml  Output -  Net 500 ml    Labs/Imaging Results for orders placed or performed during the hospital encounter of 08/11/18 (from the past 48 hour(s))  Lipase, blood     Status: None   Collection Time: 08/11/18  8:15 AM  Result Value Ref Range   Lipase 36 11 - 51 U/L    Comment: Performed at [facility],  [address] [place], [place] [address]  Comprehensive metabolic panel     Status: Abnormal   Collection Time: 08/11/18  8:15 AM  Result Value Ref Range   Sodium 140 135 - 145 mmol/L   Potassium 4.3 3.5 - 5.1 mmol/L   Chloride 108 98 - 111 mmol/L   CO2 24 22 - 32 mmol/L   Glucose, Bld 150 (H) 70 - 99 mg/dL   BUN 16 8 - 23 mg/dL   Creatinine, Ser 7.251.18 0.61 - 1.24 mg/dL   Calcium 8.9 8.9 - 36.610.3 mg/dL   Total Protein 7.6 6.5 - 8.1 g/dL   Albumin 4.1 3.5 - 5.0 g/dL   AST 19 15 - 41 U/L   ALT 16 0 - 44 U/L   Alkaline Phosphatase 56 38 - 126 U/L   Total Bilirubin 0.8 0.3 - 1.2 mg/dL   GFR calc non Af Amer >60 >60 mL/min   GFR calc Af Amer >60 >60 mL/min   Anion gap 8 5 - 15    Comment: Performed at [facility], [address] [place], [place] [address]  CBC     Status: Abnormal   Collection Time: 08/11/18  8:15 AM  Result Value Ref Range   WBC 11.2 (H) 4.0 - 10.5 K/uL   RBC 5.10 4.22 - 5.81 MIL/uL  Hemoglobin 15.0 13.0 - 17.0 g/dL   HCT 16.145.7 09.639.0 - 04.552.0 %   MCV 89.6 80.0 - 100.0 fL   MCH 29.4 26.0 - 34.0 pg   MCHC 32.8 30.0 - 36.0 g/dL   RDW 40.913.6 81.111.5 - 91.415.5 %   Platelets 271 150 - 400 K/uL   nRBC 0.0 0.0 - 0.2 %    Comment: Performed at [facility], [address] [place], [place] [address]  Lactic acid, plasma     Status: None   Collection Time: 08/11/18  8:15 AM  Result Value Ref Range   Lactic Acid, Venous 1.0 0.5 - 1.9 mmol/L    Comment: Performed at [facility], [address] [place], [place] [address]  Troponin I (High Sensitivity)     Status: Abnormal   Collection Time: 08/11/18  8:15 AM  Result Value Ref Range   Troponin I (High Sensitivity) 18 (H) <18 ng/L    Comment: (NOTE) Elevated high sensitivity troponin I (hsTnI) values and significant  changes across serial measurements may suggest ACS but many other  chronic and acute conditions are known to elevate hsTnI results.  Refer to the "Links" section for chest pain  algorithms and additional  guidance. Performed at [facility], [address] [place], [place] [address]   Troponin I (High Sensitivity)     Status: None   Collection Time: 08/11/18 11:48 AM  Result Value Ref Range   Troponin I (High Sensitivity) 15 <18 ng/L    Comment: (NOTE) Elevated high sensitivity troponin I (hsTnI) values and significant  changes across serial measurements may suggest ACS but many other  chronic and acute conditions are known to elevate hsTnI results.  Refer to the "Links" section for chest pain algorithms and additional  guidance. Performed at [facility], [address] [place], [place] [address]   SARS Coronavirus 2 (CEPHEID - Performed in [facility] lab), Hosp Order     Status: None   Collection Time: 08/11/18 11:48 AM   Specimen: Nasopharyngeal Swab  Result Value Ref Range   SARS Coronavirus 2 NEGATIVE NEGATIVE    Comment: (NOTE) If result is NEGATIVE SARS-CoV-2 target nucleic acids are NOT DETECTED. The SARS-CoV-2 RNA is generally detectable in upper and lower  respiratory specimens during the acute phase of infection. The lowest  concentration of SARS-CoV-2 viral copies this assay can detect is 250  copies / mL. A negative result does not preclude SARS-CoV-2 infection  and should not be used as the sole basis for treatment or other  patient management decisions.  A negative result may occur with  improper specimen collection / handling, submission of specimen other  than nasopharyngeal swab, presence of viral mutation(s) within the  areas targeted by this assay, and inadequate number of viral copies  (<250 copies / mL). A negative result must be combined with clinical  observations, patient history, and epidemiological information. If result is POSITIVE SARS-CoV-2 target nucleic acids are DETECTED. The SARS-CoV-2 RNA is generally detectable in upper and lower  respiratory specimens dur ing the acute phase of  infection.  Positive  results are indicative of active infection with SARS-CoV-2.  Clinical  correlation with patient history and other diagnostic information is  necessary to determine patient infection status.  Positive results do  not rule out bacterial infection or co-infection with other viruses. If result is PRESUMPTIVE POSTIVE SARS-CoV-2 nucleic acids MAY BE PRESENT.   A presumptive positive result was obtained on the submitted  specimen  and confirmed on repeat testing.  While 2019 novel coronavirus  (SARS-CoV-2) nucleic acids may be present in the submitted sample  additional confirmatory testing may be necessary for epidemiological  and / or clinical management purposes  to differentiate between  SARS-CoV-2 and other Sarbecovirus currently known to infect humans.  If clinically indicated additional testing with an alternate test  methodology [phone] is advised. The SARS-CoV-2 RNA is generally  detectable in upper and lower respiratory sp ecimens during the acute  phase of infection. The expected result is Negative. Fact Sheet for Patients:  BoilerBrush.com.cyhttps://www.fda.gov/media/136312/download Fact Sheet for Healthcare Providers: https://pope.com/https://www.fda.gov/media/136313/download This test is not yet approved or cleared by the [place] FDA and has been authorized for detection and/or diagnosis of SARS-CoV-2 by FDA under an Emergency Use Authorization (EUA).  This EUA will remain in effect (meaning this test can be used) for the duration of the COVID-19 declaration under Section 564(b)(1) of the Act, 21 U.S.C. section 360bbb-3(b)(1), unless the authorization is terminated or revoked sooner. Performed at [facility], [address] [place], [place] [address]    Ct Angio Chest/abd/pel For Dissection W And/or Wo Contrast  Result Date: 08/11/2018 CLINICAL DATA:  73 year old male with a history of chest pain and abdominal pain EXAM: CT ANGIOGRAPHY CHEST, ABDOMEN AND PELVIS  TECHNIQUE: Multidetector CT imaging through the chest, abdomen and pelvis was performed using the standard protocol during bolus administration of intravenous contrast. Multiplanar reconstructed images and MIPs were obtained and reviewed to evaluate the vascular anatomy. CONTRAST:  100mL ISOVUE-370 IOPAMIDOL (ISOVUE-370) INJECTION 76% COMPARISON:  No prior CT FINDINGS: CTA CHEST FINDINGS Cardiovascular: Heart: Cardiomegaly. No pericardial fluid/thickening. Dense calcifications of native coronary arteries. Surgical changes of median sternotomy and CABG. Calcifications of the aortic arch valve. Aorta: The noncontrast chest CT demonstrates no hyperdense crescent within the thoracic aorta. No aneurysm. No periaortic fluid or inflammatory changes. Postcontrast images demonstrate no dissection flap of the aorta. Mild to moderate atherosclerotic changes of the aorta. Two vessel arch with a common origin of the innominate air artery and the left common carotid artery. Bilateral brachial artery patent. The cervical arteries at the base of the neck are patent. Pulmonary arteries: No central, lobar, segmental, or proximal subsegmental filling defects. Mediastinum/Nodes: Small lymph nodes of the mediastinum. Unremarkable appearance of the thoracic esophagus. Unremarkable thoracic inlet Lungs/Pleura: Minimal debris within the right mainstem bronchus and left mainstem bronchus. No pleural effusion. No confluent airspace disease. No pneumothorax. Calcified granuloma within the right upper lobe on image 69 of series 6. Mild centrilobular and paraseptal emphysema. No bronchial wall thickening. Review of the MIP images confirms the above findings. CTA ABDOMEN AND PELVIS FINDINGS VASCULAR Aorta: Unremarkable course, caliber, contour of the abdominal aorta. No dissection, aneurysm, or periaortic fluid. Mild to moderate atherosclerotic changes of the abdominal aorta. Celiac: Less than 50% narrowing at the origin of the celiac artery  with poststenotic dilation likely secondary to overlying diaphragmatic cruise. Branches are patent. SMA: SMA patent with mild atherosclerotic changes at the origin. Renals: Mild atherosclerotic changes at the origin the bilateral renal arteries. No high-grade stenosis on the right. On the left the left Landan Fedie renal artery demonstrates at least 50% narrowing at the origin. There is accessory renal artery to the lower pole. IMA: IMA remains patent, likely stenotic at the origin. Right lower extremity: Unremarkable course, caliber, and contour of the right iliac system. No aneurysm, dissection, or occlusion. Hypogastric artery is patent. Anterior and posterior division patent. Common femoral artery patent.  Proximal SFA and profunda femoris patent. Moderate atherosclerotic changes of the right iliac and proximal femoral system. Left lower extremity: Unremarkable course, caliber, and contour of the left iliac system. No aneurysm, dissection, or occlusion. Hypogastric artery is patent. Anterior and posterior division patent. Common femoral artery patent. Proximal SFA and profunda femoris patent. Moderate atherosclerotic changes of the left iliac and proximal femoral system. Veins: Unremarkable appearance of the venous system. Review of the MIP images confirms the above findings. NON-VASCULAR Hepatobiliary: Unremarkable appearance of the liver. Unremarkable gall bladder. Pancreas: Unremarkable pancreas Spleen: Unremarkable Adrenals/Urinary Tract: Unremarkable Right: No hydronephrosis. Symmetric perfusion to the left. No nephrolithiasis. Unremarkable course of the right ureter. Left: No hydronephrosis. Symmetric perfusion to the right. No nephrolithiasis. Unremarkable course of the left ureter. The left dome of the urinary bladder is entrapped within left inguinal hernia. No significant inflammatory changes. Stomach/Bowel: Unremarkable appearance of the stomach. Unremarkable appearance of small bowel. No evidence of  obstruction. Thickened and fluid-filled appendix with inflammatory changes at the margin. No free air. No focal fluid. No fecalith. No comparison CT. Colonic diverticula without evidence of acute diverticulitis. Lymphatic: No lymphadenopathy. Mesenteric: No free air or free fluid.  No mesenteric adenopathy Reproductive: Prostate measures 5.5 cm Other: Left inguinal hernia containing left dome of urinary bladder. Musculoskeletal: Degenerative changes of the thoracolumbar spine. Vacuum disc phenomenon at L3-L4, L4-L5, L5-S1. no bony canal narrowing. Degenerative changes of the bilateral hips. No aggressive lucent or sclerotic lesions. No displaced fracture identified. Review of the MIP images confirms the above findings. IMPRESSION: No acute arterial abnormality, with no evidence of acute aortic syndrome. Early appendicitis is suspected without complicating features. These results were discussed by telephone at the time of interpretation on 08/11/2018 at 10:19 am with Dr. Sharyn Creamer. Aortic Atherosclerosis (ICD10-I70.0). Associated native coronary artery disease and surgical changes of CABG. Emphysema (ICD10-J43.9). No evidence of pneumonia, although there is minimal debris within the right and left mainstem bronchi at the carina, potentially secondary to poor cough response or aspiration. The left dome of the urinary bladder is entrapped within a left inguinal hernia. Additional ancillary findings as above. Signed, Yvone Neu. Reyne Dumas, RPVI [facility] Electronically Signed   By: Gilmer Mor D.O.   On: 08/11/2018 10:21    Pending Labs Unresulted Labs (From admission, onward)    Start     Ordered   08/11/18 0806  Urinalysis, Complete w Microscopic  ONCE - STAT,   STAT     08/11/18 0805          Vitals/Pain Today's Vitals   08/11/18 1002 08/11/18 1005 08/11/18 1100 08/11/18 1151  BP: (!) 170/92  (!) 168/94 (!) 162/98  Pulse: 88  76 83  Resp:  Temp:      TempSrc:      SpO2: 98%  98% 98%  Weight:      Height:      PainSc:  2       Isolation Precautions No active isolations  Medications Medications  HYDROmorphone (DILAUDID) injection 0.5 mg (has no administration in time range)  ondansetron (ZOFRAN-ODT) disintegrating tablet 4 mg (has no administration in time range)    Or  ondansetron (ZOFRAN) injection 4 mg (has no administration in time range)  pantoprazole (PROTONIX) injection 40 mg (has no administration in time range)  enoxaparin (LOVENOX) injection 40 mg (has no administration in time range)  0.9 %  sodium chloride infusion (has no administration in  time range)  piperacillin-tazobactam (ZOSYN) IVPB 3.375 g (has no administration in time range)  ketorolac (TORADOL) 30 MG/ML injection 15 mg (has no administration in time range)  albuterol (VENTOLIN HFA) 108 (90 Base) MCG/ACT inhaler 2 puff (has no administration in time range)  carvedilol (COREG) tablet 6.25 mg (has no administration in time range)  enalapril (VASOTEC) tablet 20 mg (has no administration in time range)  furosemide (LASIX) tablet 40 mg (has no administration in time range)  morphine 4 MG/ML injection 4 mg (4 mg Intravenous Given 08/11/18 0824)  ondansetron (ZOFRAN) injection 4 mg (4 mg Intravenous Given 08/11/18 0822)  sodium chloride 0.9 % bolus 500 mL (0 mLs Intravenous Stopped 08/11/18 0858)  morphine 2 MG/ML injection 2 mg (2 mg Intravenous Given 08/11/18 0853)  iopamidol (ISOVUE-370) 76 % injection 100 mL (100 mLs Intravenous Contrast Given 08/11/18 2336)    Mobility walks Low fall risk   Focused Assessments  R Recommendations: See Admitting Provider Note  Report given to:   Additional Notes:

## 2018-08-11 NOTE — Care Management Obs Status (Signed)
[place] NOTIFICATION   Patient Details  Name: Benjamin Valentine MRN: [id number] Date of Birth: [birth year]   Medicare Observation Status Notification Given:  Yes    Malyna Budney A Gaynell Eggleton, RN 08/11/2018, 2:41 PM

## 2018-08-11 NOTE — Anesthesia Procedure Notes (Signed)
Procedure Name: Intubation Date/Time: 08/11/2018 7:41 PM Performed by: Timoteo Expose, CRNA Pre-anesthesia Checklist: Patient identified, Emergency Drugs available, Suction available, Patient being monitored and Timeout performed Patient Re-evaluated:Patient Re-evaluated prior to induction Oxygen Delivery Method: Circle system utilized Preoxygenation: Pre-oxygenation with 100% oxygen Induction Type: IV induction Laryngoscope Size: McGraph and 4 Grade View: Grade II Tube type: Oral Tube size: 7.5 mm Number of attempts: 1 Airway Equipment and Method: Stylet Dental Injury: Teeth and Oropharynx as per pre-operative assessment

## 2018-08-11 NOTE — Anesthesia Preprocedure Evaluation (Signed)
Anesthesia Evaluation  Patient identified by MRN, date of birth, ID band Patient awake    Reviewed: Allergy & Precautions, NPO status , Patient's Chart, lab work & pertinent test results  History of Anesthesia Complications Negative for: history of anesthetic complications  Airway Mallampati: II       Dental  (+) Poor Dentition, Missing, Chipped   Pulmonary neg sleep apnea, neg COPD, former smoker,           Cardiovascular hypertension, Pt. on medications + CAD and + CABG  (-) CHF (-) dysrhythmias (-) Valvular Problems/Murmurs     Neuro/Psych neg Seizures    GI/Hepatic Neg liver ROS, neg GERD  ,  Endo/Other  neg diabetes  Renal/GU negative Renal ROS     Musculoskeletal   Abdominal   Peds  Hematology   Anesthesia Other Findings   Reproductive/Obstetrics                             Anesthesia Physical Anesthesia Plan  ASA: III and emergent  Anesthesia Plan: General   Post-op Pain Management:    Induction:   PONV Risk Score and Plan: 2 and Dexamethasone and Ondansetron  Airway Management Planned: Oral ETT  Additional Equipment:   Intra-op Plan:   Post-operative Plan:   Informed Consent: I have reviewed the patients History and Physical, chart, labs and discussed the procedure including the risks, benefits and alternatives for the proposed anesthesia with the patient or authorized representative who has indicated his/her understanding and acceptance.       Plan Discussed with:   Anesthesia Plan Comments:         Anesthesia Quick Evaluation

## 2018-08-11 NOTE — ED Provider Notes (Signed)
[facility] Emergency Department Provider Note   ____________________________________________   First MD Initiated Contact with Patient 08/11/18 0809     (approximate)  I have reviewed the triage vital signs and the nursing notes.   HISTORY  Chief Complaint Abdominal Pain and Chest Pain    HPI Benjamin Valentine is a 73 y.o. male here for evaluation of left side abdominal pain  Patient reports at midnight woke up with pain in his left lower abdomen.  Associated with some nausea, vomited clear liquid no black or bloody 3 times this morning.  Also had 3 loose watery stools without black or bloody.  The pain has begin to creep from the left lower abdomen up more towards his left upper abdomen and feels like it radiates a little bit into his left lower chest as well.  No shortness of breath.  No fevers or chills.  No cough no cold no exposure to coronavirus  Pain is currently rated as moderate to severe and feels deep located primarily left side the abdomen.  No right abdominal pain.  Does not radiate to the back.  Pain feels like it is moved up some or is advancing upward.   Past Medical History:  Diagnosis Date   Arthritis    Coronary artery disease    Hypertension     Patient Active Problem List   Diagnosis Date Noted   Acute appendicitis 08/11/2018    Past Surgical History:  Procedure Laterality Date   CARDIAC SURGERY     CABG 2002    Prior to Admission medications   Medication Sig Start Date End Date Taking? Authorizing Provider  albuterol (VENTOLIN HFA) 108 (90 Base) MCG/ACT inhaler Inhale 2 puffs into the lungs every 6 (six) hours as needed for wheezing or shortness of breath. 06/06/18  Yes Emily FilbertWilliams, Jonathan E, MD  aspirin EC 81 MG tablet Take 81 mg by mouth daily.   Yes [provider]  carvedilol (COREG) 6.25 MG tablet Take 6.25 mg by mouth 2 (two) times daily with a meal. 08/23/17  Yes [provider]  enalapril  (VASOTEC) 20 MG tablet Take 20 mg by mouth daily. 06/06/18  Yes [provider]  furosemide (LASIX) 20 MG tablet Take 1 tablet (20 mg total) by mouth 2 (two) times daily. Patient taking differently: Take 40 mg by mouth daily.  08/21/17 08/21/18 Yes Merrily Brittleifenbark, Neil, MD  naproxen sodium (ALEVE) 220 MG tablet Take 220-440 mg by mouth 2 (two) times daily as needed (pain).   Yes [provider]  predniSONE (DELTASONE) 50 MG tablet Take 1 tablet by mouth daily Patient not taking: Reported on 08/11/2018 06/06/18   Emily FilbertWilliams, Jonathan E, MD    Allergies Patient has no known allergies.  No family history on file.  Social History Social History   Tobacco Use   Smoking status: Former Smoker   Smokeless tobacco: Never Used  Substance Use Topics   Alcohol use: No   Drug use: No    Review of Systems Constitutional: No fever/chills Eyes: No visual changes. ENT: No sore throat. Cardiovascular: Denies chest pain except the pain seems like it radiates towards his left lower chest. Respiratory: Denies shortness of breath. Gastrointestinal: See HPI Genitourinary: Negative for dysuria. Musculoskeletal: Negative for back pain. Skin: Negative for rash. Neurological: Negative for headaches, areas of focal weakness or numbness.    ____________________________________________   PHYSICAL EXAM:  VITAL SIGNS: ED Triage Vitals  Enc Vitals Group     BP  08/11/18 0747 (!) 195/112     Pulse Rate 08/11/18 0747 90     Resp 08/11/18 0747 18     Temp 08/11/18 0747 98.9 F (37.2 C)     Temp Source 08/11/18 0747 Oral     SpO2 08/11/18 0747 98 %     Weight 08/11/18 0748 225 lb (102.1 kg)     Height 08/11/18 0748 5\' 9"  (1.753 m)     Head Circumference --      Peak Flow --      Pain Score 08/11/18 0747 8     Pain Loc --      Pain Edu? --      Excl. in GC? --     Constitutional: Alert and oriented.  Appears in moderate discomfort.  No acute distress.  Conversant and pleasant. Eyes:  Conjunctivae are normal. Head: Atraumatic. Nose: No congestion/rhinnorhea. Mouth/Throat: Mucous membranes are moist. Neck: No stridor.  Cardiovascular: Normal rate, regular rhythm. Grossly normal heart sounds.  Good peripheral circulation. Respiratory: Normal respiratory effort.  No retractions. Lungs CTAB. Gastrointestinal: Soft and seems mild tenderness to examination of the left side of the abdomen.  No pain reported over the right side. No distention.  Pain seems slightly out of proportion to examination   Musculoskeletal: No lower extremity tenderness nor edema.  Strong dorsalis pedis pulses bilateral. Neurologic:  Normal speech and language. No gross focal neurologic deficits are appreciated.  Skin:  Skin is warm, dry and intact. No rash noted. Psychiatric: Mood and affect are normal. Speech and behavior are normal.  ____________________________________________   LABS (all labs ordered are listed, but only abnormal results are displayed)  Labs Reviewed  COMPREHENSIVE METABOLIC PANEL - Abnormal; Notable for the following components:      Result Value   Glucose, Bld 150 (*)    All other components within normal limits  CBC - Abnormal; Notable for the following components:   WBC 11.2 (*)    All other components within normal limits  TROPONIN I (HIGH SENSITIVITY) - Abnormal; Notable for the following components:   Troponin I (High Sensitivity) 18 (*)    All other components within normal limits  SARS CORONAVIRUS 2 (HOSPITAL ORDER, PERFORMED IN [facility])  LIPASE, BLOOD  LACTIC ACID, PLASMA  TROPONIN I (HIGH SENSITIVITY)  URINALYSIS, COMPLETE (UACMP) WITH MICROSCOPIC   ____________________________________________  EKG  Reviewed enterotomy at 755 Heart rate 90 QRS 90 QTc 470 Normal sinus rhythm, first-degree AV block.  T wave inversions 1-3 aVF and slight in V5 and V6.  Compared with his previous EKG from June 06, 2018 no changes  noted ____________________________________________  RADIOLOGY  Ct Angio Chest/abd/pel For Dissection W And/or Wo Contrast  Result Date: 08/11/2018 CLINICAL DATA:  73 year old male with a history of chest pain and abdominal pain EXAM: CT ANGIOGRAPHY CHEST, ABDOMEN AND PELVIS TECHNIQUE: Multidetector CT imaging through the chest, abdomen and pelvis was performed using the standard protocol during bolus administration of intravenous contrast. Multiplanar reconstructed images and MIPs were obtained and reviewed to evaluate the vascular anatomy. CONTRAST:  100mL ISOVUE-370 IOPAMIDOL (ISOVUE-370) INJECTION 76% COMPARISON:  No prior CT FINDINGS: CTA CHEST FINDINGS Cardiovascular: Heart: Cardiomegaly. No pericardial fluid/thickening. Dense calcifications of native coronary arteries. Surgical changes of median sternotomy and CABG. Calcifications of the aortic arch valve. Aorta: The noncontrast chest CT demonstrates no hyperdense crescent within the thoracic aorta. No aneurysm. No periaortic fluid or inflammatory changes. Postcontrast images demonstrate no dissection flap of the aorta. Mild to moderate  atherosclerotic changes of the aorta. Two vessel arch with a common origin of the innominate air artery and the left common carotid artery. Bilateral brachial artery patent. The cervical arteries at the base of the neck are patent. Pulmonary arteries: No central, lobar, segmental, or proximal subsegmental filling defects. Mediastinum/Nodes: Small lymph nodes of the mediastinum. Unremarkable appearance of the thoracic esophagus. Unremarkable thoracic inlet Lungs/Pleura: Minimal debris within the right mainstem bronchus and left mainstem bronchus. No pleural effusion. No confluent airspace disease. No pneumothorax. Calcified granuloma within the right upper lobe on image 69 of series 6. Mild centrilobular and paraseptal emphysema. No bronchial wall thickening. Review of the MIP images confirms the above findings. CTA  ABDOMEN AND PELVIS FINDINGS VASCULAR Aorta: Unremarkable course, caliber, contour of the abdominal aorta. No dissection, aneurysm, or periaortic fluid. Mild to moderate atherosclerotic changes of the abdominal aorta. Celiac: Less than 50% narrowing at the origin of the celiac artery with poststenotic dilation likely secondary to overlying diaphragmatic cruise. Branches are patent. SMA: SMA patent with mild atherosclerotic changes at the origin. Renals: Mild atherosclerotic changes at the origin the bilateral renal arteries. No high-grade stenosis on the right. On the left the left main renal artery demonstrates at least 50% narrowing at the origin. There is accessory renal artery to the lower pole. IMA: IMA remains patent, likely stenotic at the origin. Right lower extremity: Unremarkable course, caliber, and contour of the right iliac system. No aneurysm, dissection, or occlusion. Hypogastric artery is patent. Anterior and posterior division patent. Common femoral artery patent. Proximal SFA and profunda femoris patent. Moderate atherosclerotic changes of the right iliac and proximal femoral system. Left lower extremity: Unremarkable course, caliber, and contour of the left iliac system. No aneurysm, dissection, or occlusion. Hypogastric artery is patent. Anterior and posterior division patent. Common femoral artery patent. Proximal SFA and profunda femoris patent. Moderate atherosclerotic changes of the left iliac and proximal femoral system. Veins: Unremarkable appearance of the venous system. Review of the MIP images confirms the above findings. NON-VASCULAR Hepatobiliary: Unremarkable appearance of the liver. Unremarkable gall bladder. Pancreas: Unremarkable pancreas Spleen: Unremarkable Adrenals/Urinary Tract: Unremarkable Right: No hydronephrosis. Symmetric perfusion to the left. No nephrolithiasis. Unremarkable course of the right ureter. Left: No hydronephrosis. Symmetric perfusion to the right. No  nephrolithiasis. Unremarkable course of the left ureter. The left dome of the urinary bladder is entrapped within left inguinal hernia. No significant inflammatory changes. Stomach/Bowel: Unremarkable appearance of the stomach. Unremarkable appearance of small bowel. No evidence of obstruction. Thickened and fluid-filled appendix with inflammatory changes at the margin. No free air. No focal fluid. No fecalith. No comparison CT. Colonic diverticula without evidence of acute diverticulitis. Lymphatic: No lymphadenopathy. Mesenteric: No free air or free fluid.  No mesenteric adenopathy Reproductive: Prostate measures 5.5 cm Other: Left inguinal hernia containing left dome of urinary bladder. Musculoskeletal: Degenerative changes of the thoracolumbar spine. Vacuum disc phenomenon at L3-L4, L4-L5, L5-S1. no bony canal narrowing. Degenerative changes of the bilateral hips. No aggressive lucent or sclerotic lesions. No displaced fracture identified. Review of the MIP images confirms the above findings. IMPRESSION: No acute arterial abnormality, with no evidence of acute aortic syndrome. Early appendicitis is suspected without complicating features. These results were discussed by telephone at the time of interpretation on 08/11/2018 at 10:19 am with Dr. Delman Kitten. Aortic Atherosclerosis (ICD10-I70.0). Associated native coronary artery disease and surgical changes of CABG. Emphysema (ICD10-J43.9). No evidence of pneumonia, although there is minimal debris within the right and left mainstem bronchi at the  carina, potentially secondary to poor cough response or aspiration. The left dome of the urinary bladder is entrapped within a left inguinal hernia. Additional ancillary findings as above. Signed, Yvone NeuJaime S. Reyne DumasWagner, DO, RPVI [facility] Electronically Signed   By: Gilmer MorJaime  Wagner D.O.   On: 08/11/2018 10:21    CT scan reviewed.  Discussed with radiologist.  Also  discussed with Dr. Aleen CampiPiscoya ____________________________________________   PROCEDURES  Procedure(s) performed: None  Procedures  Critical Care performed: No  ____________________________________________   INITIAL IMPRESSION / ASSESSMENT AND PLAN / ED COURSE  Pertinent labs & imaging results that were available during my care of the patient were reviewed by me and considered in my medical decision making (see chart for details).   Differential diagnosis includes but is not limited to, abdominal perforation, aortic dissection, cholecystitis, appendicitis, diverticulitis, colitis, esophagitis/gastritis, kidney stone, pyelonephritis, urinary tract infection, aortic aneurysm. All are considered in decision and treatment plan. Based upon the patient's presentation and risk factors, will proceed to CT including angiography given pain is slightly out of proportion to examination any reports of deep-seated pain with hypertension I do wish to exclude acute aortic pathology as etiology  With his pain initially in the left lower abdomen I doubt this would be cardiac or pulmonary etiology.  He has no risk factors for COVID. Benjamin Valentine was evaluated in Emergency Department on 08/11/2018 for the symptoms described in the history of present illness. He was evaluated in the context of the global COVID-19 pandemic, which necessitated consideration that the patient might be at risk for infection with the SARS-CoV-2 virus that causes COVID-19. Institutional protocols and algorithms that pertain to the evaluation of patients at risk for COVID-19 are in a state of rapid change based on information released by regulatory bodies including the CDC and federal and state organizations. These policies and algorithms were followed during the patient's care in the ED. however, given his extensive history of coronary disease I will send a troponin as he does report some radiation of the pain the left lower  chest   Clinical Course as of Aug 11 1426  Sun Aug 11, 2018  0847 Patient reports the pain has improved, still having some ongoing moderate discomfort, will order additional small dose of morphine at this time.   [MQ]  0934 High-sensitivity troponin reviewed, next actually quite reassuring.  Patient very atypical symptoms, relatively low high-sensitivity troponin.  Plan to recheck this again as we exclude ACS, but thus far I do not see any evidence of acute ACS as cause   [MQ]  1048 Consult placed with Dr. Aleen CampiPiscoya, will see after OR case.  Patient resting much more comfortably now.  Patient agreeable understanding of wait for surgical consultation   [MQ]    Clinical Course User Index [MQ] Sharyn CreamerQuale, Ikea Demicco, MD    Patient admitted to general surgery for further work-up and evaluation of etiology of abdominal pain.  CT scan reviewed with Dr. Aleen CampiPiscoya.  Patient admitted by surgical service.  Question of appendicitis. ____________________________________________   FINAL CLINICAL IMPRESSION(S) / ED DIAGNOSES  Final diagnoses:  Abdominal pain, unspecified abdominal location        Note:  This document was prepared using Dragon voice recognition software and may include unintentional dictation errors       Sharyn CreamerQuale, Shalamar Crays, MD 08/11/18 1428

## 2018-08-12 ENCOUNTER — Encounter: Payer: Self-pay | Admitting: Surgery

## 2018-08-12 MED ORDER — IBUPROFEN 800 MG PO TABS: 800.0000 mg | ORAL_TABLET | Freq: Three times a day (TID) | ORAL | 0 refills | Status: DC | PRN

## 2018-08-12 MED ORDER — OXYCODONE HCL 5 MG PO TABS: 5.0000 mg | ORAL_TABLET | Freq: Four times a day (QID) | ORAL | 0 refills | Status: DC | PRN

## 2018-08-12 NOTE — Discharge Summary (Signed)
[facility] SURGICAL DISCHARGE SUMMARY  Patient ID: Benjamin ReichmannJames C Szydlowski MRN: [id number] DOB/AGE: 74/02/1945 73 y.o.  Admit date: 08/11/2018 Discharge date: 08/12/2018  Discharge Diagnoses Patient Active Problem List   Diagnosis Date Noted  . Acute appendicitis 08/11/2018    Consultants None  Procedures 08/11/2018  HPI: Benjamin ReichmannJames C Nodal is a 73 y.o. male presenting with new onset abdominal pain since last night.  Patient reports that yesterday he was doing well until about midnight when the pain started.  He feels the pain is in the left abdomen.  Associated with nausea and episodes of emesis.  Denies any fevers but had chills around 2 AM.  Pain does not radiate.  He presented to the ED and his workup included labs and CT scan.  His labs show a WBC of 11.2, but otherwise unremarkable labs.  He had CTA which I have independently viewed and agree with findings, which show acute appendicitis.  There is also of note a left inguinal hernia, which contains a portion of his bladder.  He reports that for voiding, sometimes it helps to push on his hernia, which he has had for many years now.    Hospital Course: Informed consent was obtained and documented, and patient underwent uneventful laparoscopic appendectomy (Dr Aleen CampiPiscoya, 08/12/2018).  Post-operatively, patient's pauin improved/resolved and advancement of patient's diet and ambulation were well-tolerated. The remainder of patient's hospital course was essentially unremarkable, and discharge planning was initiated accordingly with patient safely able to be discharged home with appropriate discharge instructions, pain control, and outpatient follow-up after all of his questions were answered to his expressed satisfaction.  Discharge Condition: Good   Physical Examination:  Constitutional: Well appearing male, NAD Pulmonary: Normal effort, no respiratory distress Gastrointestinal: Soft, incisional soreness, non-distended, no rebound,  guarding Skin: Laparoscoic incisions are CDI, no erythema or drainage   Allergies as of 08/12/2018   No Known Allergies     Medication List    STOP taking these medications   predniSONE 50 MG tablet Commonly known as: DELTASONE     TAKE these medications   albuterol 108 (90 Base) MCG/ACT inhaler Commonly known as: VENTOLIN HFA Inhale 2 puffs into the lungs every 6 (six) hours as needed for wheezing or shortness of breath.   aspirin EC 81 MG tablet Take 81 mg by mouth daily.   carvedilol 6.25 MG tablet Commonly known as: COREG Take 6.25 mg by mouth 2 (two) times daily with a meal.   enalapril 20 MG tablet Commonly known as: VASOTEC Take 20 mg by mouth daily.   furosemide 20 MG tablet Commonly known as: Lasix Take 1 tablet (20 mg total) by mouth 2 (two) times daily. What changed:   how much to take  when to take this   ibuprofen 800 MG tablet Commonly known as: ADVIL Take 1 tablet (800 mg total) by mouth every 8 (eight) hours as needed.   naproxen sodium 220 MG tablet Commonly known as: ALEVE Take 220-440 mg by mouth 2 (two) times daily as needed (pain).   oxyCODONE 5 MG immediate release tablet Commonly known as: Oxy IR/ROXICODONE Take 1 tablet (5 mg total) by mouth every 6 (six) hours as needed for severe pain or breakthrough pain.        Follow-up Information    Piscoya, Elita QuickJose, MD. Schedule an appointment as soon as possible for a visit in 2 week(s).   Specialty: General Surgery Why: s/p lap chole Contact information: [address] [place] [id number]  [phone]            Time spent on discharge management including discussion of hospital course, clinical condition, outpatient instructions, prescriptions, and follow up with the patient and members of the medical team: >30 minutes  -- Edison Simon , [name] [facility]  08/12/2018, 11:02 AM [phone] M-F: 7am - 4pm

## 2018-08-12 NOTE — Progress Notes (Signed)
Francella Solian to be D/C'd Home per MD order.  Discussed prescriptions and follow up appointments with the patient. Prescriptions given to patient, medication list explained in detail. Pt verbalized understanding.  Allergies as of 08/12/2018   No Known Allergies     Medication List    STOP taking these medications   predniSONE 50 MG tablet Commonly known as: DELTASONE     TAKE these medications   albuterol 108 (90 Base) MCG/ACT inhaler Commonly known as: VENTOLIN HFA Inhale 2 puffs into the lungs every 6 (six) hours as needed for wheezing or shortness of breath.   aspirin EC 81 MG tablet Take 81 mg by mouth daily. Notes to patient: 08/13/2018   carvedilol 6.25 MG tablet Commonly known as: COREG Take 6.25 mg by mouth 2 (two) times daily with a meal.   enalapril 20 MG tablet Commonly known as: VASOTEC Take 20 mg by mouth daily.   furosemide 20 MG tablet Commonly known as: Lasix Take 1 tablet (20 mg total) by mouth 2 (two) times daily. What changed:   how much to take  when to take this   ibuprofen 800 MG tablet Commonly known as: ADVIL Take 1 tablet (800 mg total) by mouth every 8 (eight) hours as needed.   naproxen sodium 220 MG tablet Commonly known as: ALEVE Take 220-440 mg by mouth 2 (two) times daily as needed (pain).   oxyCODONE 5 MG immediate release tablet Commonly known as: Oxy IR/ROXICODONE Take 1 tablet (5 mg total) by mouth every 6 (six) hours as needed for severe pain or breakthrough pain.       Vitals:   08/12/18 0503 08/12/18 1140  BP: 136/83 (!) 130/93  Pulse: 65 68  Resp: 18 20  Temp: 97.8 F (36.6 C) 97.8 F (36.6 C)  SpO2: 98% 100%    Skin clean, dry and intact without evidence of skin break down, no evidence of skin tears noted. IV catheter discontinued intact. Site without signs and symptoms of complications. Dressing and pressure applied. Pt denies pain at this time. No complaints noted.  An After Visit Summary was printed and given  to the patient. Patient's money was brought from security and given to patient.Patient escorted via [place], and D/C home via private auto.  Fuller Mandril, RN

## 2018-08-13 LAB — SURGICAL PATHOLOGY

## 2018-08-27 ENCOUNTER — Ambulatory Visit (INDEPENDENT_AMBULATORY_CARE_PROVIDER_SITE_OTHER): Payer: Medicare HMO | Admitting: Surgery

## 2018-08-27 ENCOUNTER — Other Ambulatory Visit: Payer: Self-pay

## 2018-08-27 ENCOUNTER — Encounter: Payer: Self-pay | Admitting: Surgery

## 2018-08-27 VITALS — BP 170/112 | HR 71 | Temp 97.7°F | Ht 69.0 in | Wt 210.0 lb

## 2018-08-27 DIAGNOSIS — Z09 Encounter for follow-up examination after completed treatment for conditions other than malignant neoplasm: Secondary | ICD-10-CM

## 2018-08-27 DIAGNOSIS — K3580 Unspecified acute appendicitis: Secondary | ICD-10-CM

## 2018-08-27 NOTE — Progress Notes (Signed)
08/27/2018  HPI: Benjamin Valentine is a 73 y.o. male s/p laparoscopic appendectomy on 6/28 for acute appendicitis.  Patient presents for follow up.  Has been doing well, without worsening pain, nausea, or vomiting.   Vital signs: BP (!) 170/112   Pulse 71   Temp 97.7 F (36.5 C) (Skin)   Ht 5\' 9"  (1.753 m)   Wt 210 lb (95.3 kg)   SpO2 98%   BMI 31.01 kg/m    Physical Exam: Constitutional: No acute distress Abdomen:  Soft, non-distended, appropriately sore to palpation over incisions, which are clean, dry, intact.    Assessment/Plan: This is a 74 y.o. male s/p laparoscopic appendectomy.  --Reviewed pathology with patient. --Reminded of no heavy lifting or pushing of no more than 15 lbs for total 4 weeks. --Follow up prn.   Melvyn Neth, [facility]

## 2018-08-27 NOTE — Patient Instructions (Signed)
Return as needed.The patient is aware to call back for any questions or concerns.  

## 2018-09-09 ENCOUNTER — Emergency Department: Payer: Medicare HMO

## 2018-09-09 ENCOUNTER — Emergency Department
Admission: EM | Admit: 2018-09-09 | Discharge: 2018-09-09 | Disposition: A | Discharge status: Home / Self Care | Payer: Medicare HMO | Attending: Emergency Medicine | Admitting: Emergency Medicine

## 2018-09-09 ENCOUNTER — Other Ambulatory Visit: Payer: Self-pay

## 2018-09-09 DIAGNOSIS — I10 Essential (primary) hypertension: Secondary | ICD-10-CM | POA: Insufficient documentation

## 2018-09-09 DIAGNOSIS — I2510 Atherosclerotic heart disease of native coronary artery without angina pectoris: Secondary | ICD-10-CM | POA: Diagnosis not present

## 2018-09-09 DIAGNOSIS — R05 Cough: Secondary | ICD-10-CM | POA: Diagnosis not present

## 2018-09-09 DIAGNOSIS — R0602 Shortness of breath: Secondary | ICD-10-CM | POA: Diagnosis not present

## 2018-09-09 DIAGNOSIS — Z87891 Personal history of nicotine dependence: Secondary | ICD-10-CM | POA: Diagnosis not present

## 2018-09-09 DIAGNOSIS — U071 COVID-19: Secondary | ICD-10-CM | POA: Insufficient documentation

## 2018-09-09 DIAGNOSIS — Z79899 Other long term (current) drug therapy: Secondary | ICD-10-CM | POA: Diagnosis not present

## 2018-09-09 DIAGNOSIS — B342 Coronavirus infection, unspecified: Secondary | ICD-10-CM

## 2018-09-09 DIAGNOSIS — Z7982 Long term (current) use of aspirin: Secondary | ICD-10-CM | POA: Diagnosis not present

## 2018-09-09 LAB — COMPREHENSIVE METABOLIC PANEL
ALT: 19 U/L (ref 0–44)
AST: 24 U/L (ref 15–41)
Albumin: 4.2 g/dL (ref 3.5–5.0)
Alkaline Phosphatase: 64 U/L (ref 38–126)
Anion gap: 11 (ref 5–15)
BUN: 21 mg/dL (ref 8–23)
CO2: 21 mmol/L — ABNORMAL LOW (ref 22–32)
Calcium: 8.8 mg/dL — ABNORMAL LOW (ref 8.9–10.3)
Chloride: 104 mmol/L (ref 98–111)
Creatinine, Ser: 1.26 mg/dL — ABNORMAL HIGH (ref 0.61–1.24)
GFR calc Af Amer: >60 mL/min (ref >60)
GFR calc non Af Amer: 56 mL/min — ABNORMAL LOW (ref >60)
Glucose, Bld: 112 mg/dL — ABNORMAL HIGH (ref 70–99)
Potassium: 3.9 mmol/L (ref 3.5–5.1)
Sodium: 136 mmol/L (ref 135–145)
Total Bilirubin: 0.9 mg/dL (ref 0.3–1.2)
Total Protein: 8.2 g/dL — ABNORMAL HIGH (ref 6.5–8.1)

## 2018-09-09 LAB — URINALYSIS, COMPLETE (UACMP) WITH MICROSCOPIC
Bacteria, UA: NONE SEEN
Bilirubin Urine: NEGATIVE
Glucose, UA: NEGATIVE mg/dL
Hgb urine dipstick: NEGATIVE
Ketones, ur: NEGATIVE mg/dL
Leukocytes,Ua: NEGATIVE
Nitrite: NEGATIVE
Protein, ur: NEGATIVE mg/dL
Specific Gravity, Urine: 1.011 (ref 1.005–1.030)
Squamous Epithelial / HPF: NONE SEEN (ref 0–5)
WBC, UA: NONE SEEN WBC/hpf (ref 0–5)
pH: 5 (ref 5.0–8.0)

## 2018-09-09 LAB — CBC WITH DIFFERENTIAL/PLATELET
Abs Immature Granulocytes: 0.03 10*3/uL (ref 0.00–0.07)
Basophils Absolute: 0 10*3/uL (ref 0.0–0.1)
Basophils Relative: 0 %
Eosinophils Absolute: 0.2 10*3/uL (ref 0.0–0.5)
Eosinophils Relative: 4 %
HCT: 46.3 % (ref 39.0–52.0)
Hemoglobin: 15.3 g/dL (ref 13.0–17.0)
Immature Granulocytes: 1 %
Lymphocytes Relative: 21 %
Lymphs Abs: 1 10*3/uL (ref 0.7–4.0)
MCH: 28.9 pg (ref 26.0–34.0)
MCHC: 33 g/dL (ref 30.0–36.0)
MCV: 87.5 fL (ref 80.0–100.0)
Monocytes Absolute: 0.8 10*3/uL (ref 0.1–1.0)
Monocytes Relative: 15 %
Neutro Abs: 3 10*3/uL (ref 1.7–7.7)
Neutrophils Relative %: 59 %
Platelets: 172 10*3/uL (ref 150–400)
RBC: 5.29 MIL/uL (ref 4.22–5.81)
RDW: 13.6 % (ref 11.5–15.5)
WBC: 5 10*3/uL (ref 4.0–10.5)
nRBC: 0 % (ref 0.0–0.2)

## 2018-09-09 LAB — LACTIC ACID, PLASMA: Lactic Acid, Venous: 0.9 mmol/L (ref 0.5–1.9)

## 2018-09-09 LAB — SEDIMENTATION RATE: Sed Rate: 16 mm/hr (ref 0–20)

## 2018-09-09 LAB — SARS CORONAVIRUS 2 BY RT PCR (HOSPITAL ORDER, PERFORMED IN ~~LOC~~ HOSPITAL LAB): SARS Coronavirus 2: POSITIVE — AB

## 2018-09-09 LAB — TROPONIN I (HIGH SENSITIVITY)
Troponin I (High Sensitivity): 29 ng/L — ABNORMAL HIGH (ref <18)
Troponin I (High Sensitivity): 25 ng/L — ABNORMAL HIGH (ref <18)

## 2018-09-09 LAB — BRAIN NATRIURETIC PEPTIDE: B Natriuretic Peptide: 207 pg/mL — ABNORMAL HIGH (ref 0.0–100.0)

## 2018-09-09 MED ORDER — ACETAMINOPHEN 500 MG PO TABS
ORAL_TABLET | ORAL | Status: AC
  Administered 2018-09-09: 1000 mg
  Filled 2018-09-09: qty 2

## 2018-09-09 NOTE — ED Triage Notes (Signed)
Pt c/o SOB with generalized weakness and joint pain since Saturday. Pt Is in NAD on arrival, ambulatory to triage without difficulty

## 2018-09-09 NOTE — ED Notes (Signed)
Pt discharged home after verbalizing understanding of discharge instructions; nad noted. 

## 2018-09-09 NOTE — ED Notes (Signed)
Pt from home with sob and aches x 2 days. Pt also reports headache. Had appendectomy 1 month ago. Reports cough "with a little phlegm." Pt alert & oriented, nad noted.

## 2018-09-09 NOTE — Discharge Instructions (Addendum)
Please return to the emergency room here if you get increasing shortness of breath or chest heaviness or feel sicker.  Because you have the coronavirus you need to quarantine yourself for at least 2 weeks or for 1 week after the fever and cough resolved.  Do not just stay in the house.  You have a yard go outside get some son get a little bit of mild exercise every day.  If it is absolutely imperative for you to leave the house and go out in public make sure you are wearing a mask.  Do your best to avoid going out in public at all however.  If you get sicker I do not feel you are able to drive yourself please call 911.  Let them know that you have been diagnosed with the coronavirus today, July 27 they will bring you to the emergency room.

## 2018-09-09 NOTE — ED Provider Notes (Signed)
[facility] Emergency Department Provider Note   ____________________________________________   First MD Initiated Contact with Patient 09/09/18 0740     (approximate)  I have reviewed the triage vital signs and the nursing notes.   HISTORY  Chief Complaint Shortness of Breath    HPI Benjamin Valentine is a 73 y.o. male patient says he for about 3 days he is felt achiness in his joints all over his body he has had a very mild headache felt a little short of breath and thinks he has a low-grade fever.  He has an occasional cough.  He does not have any abdominal pain nausea or vomiting or diarrhea.  He feels weak.  Nothing he does seems to change this at all.  He reports he takes a medicine for his high blood pressure he takes a heart pill and something for his gout.  He also takes 1 aspirin a day and baby aspirin a day.  He recently had an appendectomy.         Past Medical History:  Diagnosis Date  . Arthritis   . Coronary artery disease   . Hypertension     Patient Active Problem List   Diagnosis Date Noted  . Acute appendicitis 08/11/2018    Past Surgical History:  Procedure Laterality Date  . CARDIAC SURGERY     CABG 2002  . LAPAROSCOPIC APPENDECTOMY N/A 08/11/2018   Procedure: APPENDECTOMY LAPAROSCOPIC;  Surgeon: [name], [name], MD;  Location: [facility] ORS;  Service: General;  Laterality: N/A;    Prior to Admission medications   Medication Sig Start Date End Date Taking? Authorizing Provider  albuterol (VENTOLIN HFA) 108 (90 Base) MCG/ACT inhaler Inhale 2 puffs into the lungs every 6 (six) hours as needed for wheezing or shortness of breath. 06/06/18   Emily FilbertWilliams, Jonathan E, MD  aspirin EC 81 MG tablet Take 81 mg by mouth daily.    [provider]  carvedilol (COREG) 6.25 MG tablet Take 6.25 mg by mouth 2 (two) times daily with a meal. 08/23/17   [provider]  enalapril (VASOTEC) 20 MG tablet Take 20 mg by mouth daily. 06/06/18    [provider]  furosemide (LASIX) 20 MG tablet Take 1 tablet (20 mg total) by mouth 2 (two) times daily. Patient taking differently: Take 40 mg by mouth daily.  08/21/17 08/21/18  Merrily Brittleifenbark, Neil, MD  ibuprofen (ADVIL) 800 MG tablet Take 1 tablet (800 mg total) by mouth every 8 (eight) hours as needed. 08/12/18   Donovan KailSchulz, Zachary R, [name]  naproxen sodium (ALEVE) 220 MG tablet Take 220-440 mg by mouth 2 (two) times daily as needed (pain).    [provider]  oxyCODONE (OXY IR/ROXICODONE) 5 MG immediate release tablet Take 1 tablet (5 mg total) by mouth every 6 (six) hours as needed for severe pain or breakthrough pain. 08/12/18   Donovan KailSchulz, Zachary R, [name]    Allergies Patient has no known allergies.  No family history on file.  Social History Social History   Tobacco Use  . Smoking status: Former Games developermoker  . Smokeless tobacco: Never Used  Substance Use Topics  . Alcohol use: No  . Drug use: No    Review of Systems  Constitutional: Slight fever/no chills Eyes: No visual changes. ENT: No sore throat. Cardiovascular: Denies chest pain. Respiratory: Slight shortness of breath. Gastrointestinal: No abdominal pain.  No nausea, no vomiting.  No diarrhea.  No constipation. Genitourinary: Negative for dysuria. Musculoskeletal: Negative for back pain.  Skin: Negative for rash. Neurological: Very mild headaches, no focal weakness    ____________________________________________   PHYSICAL EXAM:  VITAL SIGNS: ED Triage Vitals  Enc Vitals Group     BP 09/09/18 0733 (!) 163/81     Pulse Rate 09/09/18 0733 99     Resp 09/09/18 0733 18     Temp 09/09/18 0733 99.9 F (37.7 C)     Temp Source 09/09/18 0733 Oral     SpO2 09/09/18 0733 97 %     Weight 09/09/18 0731 212 lb (96.2 kg)     Height 09/09/18 0731 5\' 9"  (1.753 m)     Head Circumference --      Peak Flow --      Pain Score 09/09/18 0731 9     Pain Loc --      Pain Edu? --      Excl. in [place]? --      Constitutional: Alert and oriented. Well appearing and in no acute distress. Eyes: Conjunctivae are normal.  Head: Atraumatic. Nose: No congestion/rhinnorhea. Mouth/Throat: Mucous membranes are moist.  Oropharynx non-erythematous. Neck: No stridor.  Cardiovascular: Normal rate, regular rhythm. Grossly normal heart sounds.  Good peripheral circulation. Respiratory: Normal respiratory effort.  No retractions. Lungs CTAB. Gastrointestinal: Soft and nontender. No distention. No abdominal bruits. No CVA tenderness. Musculoskeletal: No lower extremity tenderness nor edema.  Neurologic:  Normal speech and language. No gross focal neurologic deficits are appreciated.  Skin:  Skin is warm, dry and intact. No rash noted.  ____________________________________________   LABS (all labs ordered are listed, but only abnormal results are displayed)  Labs Reviewed  SARS CORONAVIRUS 2 (HOSPITAL ORDER, [facility]) - Abnormal; Notable for the following components:      Result Value   SARS Coronavirus 2 POSITIVE (*)    All other components within normal limits  COMPREHENSIVE METABOLIC PANEL - Abnormal; Notable for the following components:   CO2 21 (*)    Glucose, Bld 112 (*)    Creatinine, Ser 1.26 (*)    Calcium 8.8 (*)    Total Protein 8.2 (*)    GFR calc non Af Amer 56 (*)    All other components within normal limits  URINALYSIS, COMPLETE (UACMP) WITH MICROSCOPIC - Abnormal; Notable for the following components:   Color, Urine YELLOW (*)    APPearance CLEAR (*)    All other components within normal limits  BRAIN NATRIURETIC PEPTIDE - Abnormal; Notable for the following components:   B Natriuretic Peptide 207.0 (*)    All other components within normal limits  TROPONIN I (HIGH SENSITIVITY) - Abnormal; Notable for the following components:   Troponin I (High Sensitivity) 29 (*)    All other components within normal limits  TROPONIN I (HIGH SENSITIVITY) - Abnormal;  Notable for the following components:   Troponin I (High Sensitivity) 25 (*)    All other components within normal limits  CULTURE, BLOOD (ROUTINE X 2)  CULTURE, BLOOD (ROUTINE X 2)  LACTIC ACID, PLASMA  CBC WITH DIFFERENTIAL/PLATELET  SEDIMENTATION RATE  LACTIC ACID, PLASMA   ____________________________________________  EKG  EKG read and interpreted by me shows normal sinus rhythm rate of 97 normal axis there is 1 PVC present there are some flipped T's inferiorly but no other changes flipped T's were present in June ____________________________________________  RADIOLOGY  ED MD interpretation:   Official radiology report(s): Dg Chest 2 View  Result Date: 09/09/2018 CLINICAL DATA:  Shortness of breath for 2  days. Cough. Coronary artery disease. EXAM: CHEST - 2 VIEW COMPARISON:  06/06/2018 FINDINGS: The heart size and mediastinal contours are within normal limits. Both lungs are clear. Prior CABG again noted. The visualized skeletal structures are unremarkable. IMPRESSION: No active cardiopulmonary disease. Electronically Signed   By: Danae OrleansJohn A Stahl M.D.   On: 09/09/2018 08:22    ____________________________________________   PROCEDURES  Procedure(s) performed (including Critical Care):  Procedures   ____________________________________________   INITIAL IMPRESSION / ASSESSMENT AND PLAN / ED COURSE  Patient's troponin was elevated but is coming down.  EKG does not look bad.  Chest x-ray is clear.  His coronavirus test is positive.  We will have him quarantine himself return if he gets any further shortness of breath or any other symptoms including chest pain.              ____________________________________________   FINAL CLINICAL IMPRESSION(S) / ED DIAGNOSES  Final diagnoses:  Coronavirus infection     ED Discharge Orders    None       Note:  This document was prepared using Dragon voice recognition software and may include unintentional dictation  errors.    Arnaldo NatalMalinda, Issac Moure F, MD 09/09/18 1144

## 2018-09-12 DIAGNOSIS — R531 Weakness: Secondary | ICD-10-CM | POA: Diagnosis not present

## 2018-09-12 DIAGNOSIS — U071 COVID-19: Secondary | ICD-10-CM | POA: Diagnosis not present

## 2018-09-12 DIAGNOSIS — I10 Essential (primary) hypertension: Secondary | ICD-10-CM | POA: Diagnosis not present

## 2018-09-12 DIAGNOSIS — I2510 Atherosclerotic heart disease of native coronary artery without angina pectoris: Secondary | ICD-10-CM | POA: Diagnosis not present

## 2018-09-12 DIAGNOSIS — R0602 Shortness of breath: Secondary | ICD-10-CM | POA: Diagnosis not present

## 2018-09-12 DIAGNOSIS — I771 Stricture of artery: Secondary | ICD-10-CM | POA: Diagnosis not present

## 2018-09-12 DIAGNOSIS — M791 Myalgia, unspecified site: Secondary | ICD-10-CM | POA: Diagnosis not present

## 2018-09-12 DIAGNOSIS — M109 Gout, unspecified: Secondary | ICD-10-CM | POA: Diagnosis not present

## 2018-09-12 DIAGNOSIS — Z87891 Personal history of nicotine dependence: Secondary | ICD-10-CM | POA: Diagnosis not present

## 2018-09-12 DIAGNOSIS — R0600 Dyspnea, unspecified: Secondary | ICD-10-CM | POA: Diagnosis not present

## 2018-09-12 DIAGNOSIS — Z951 Presence of aortocoronary bypass graft: Secondary | ICD-10-CM | POA: Diagnosis not present

## 2018-09-12 DIAGNOSIS — R9431 Abnormal electrocardiogram [ECG] [EKG]: Secondary | ICD-10-CM | POA: Diagnosis not present

## 2018-09-14 LAB — CULTURE, BLOOD (ROUTINE X 2)
Culture: NO GROWTH
Culture: NO GROWTH
Special Requests: ADEQUATE
Special Requests: ADEQUATE

## 2018-09-16 DIAGNOSIS — U071 COVID-19: Secondary | ICD-10-CM | POA: Diagnosis not present

## 2018-09-16 DIAGNOSIS — I10 Essential (primary) hypertension: Secondary | ICD-10-CM | POA: Diagnosis not present

## 2018-09-16 DIAGNOSIS — R0602 Shortness of breath: Secondary | ICD-10-CM | POA: Diagnosis not present

## 2018-09-16 DIAGNOSIS — I2510 Atherosclerotic heart disease of native coronary artery without angina pectoris: Secondary | ICD-10-CM | POA: Diagnosis not present

## 2018-09-16 DIAGNOSIS — M791 Myalgia, unspecified site: Secondary | ICD-10-CM | POA: Diagnosis not present

## 2018-09-16 DIAGNOSIS — R5383 Other fatigue: Secondary | ICD-10-CM | POA: Diagnosis not present

## 2018-09-16 DIAGNOSIS — R531 Weakness: Secondary | ICD-10-CM | POA: Diagnosis not present

## 2018-09-16 DIAGNOSIS — R918 Other nonspecific abnormal finding of lung field: Secondary | ICD-10-CM | POA: Diagnosis not present

## 2018-09-16 DIAGNOSIS — Z951 Presence of aortocoronary bypass graft: Secondary | ICD-10-CM | POA: Diagnosis not present

## 2018-09-16 DIAGNOSIS — R0600 Dyspnea, unspecified: Secondary | ICD-10-CM | POA: Diagnosis not present

## 2018-09-16 DIAGNOSIS — I517 Cardiomegaly: Secondary | ICD-10-CM | POA: Diagnosis not present

## 2018-10-03 DIAGNOSIS — I25709 Atherosclerosis of coronary artery bypass graft(s), unspecified, with unspecified angina pectoris: Secondary | ICD-10-CM | POA: Diagnosis not present

## 2018-10-03 DIAGNOSIS — R0602 Shortness of breath: Secondary | ICD-10-CM | POA: Diagnosis not present

## 2018-10-03 DIAGNOSIS — I10 Essential (primary) hypertension: Secondary | ICD-10-CM | POA: Diagnosis not present

## 2018-10-03 DIAGNOSIS — B029 Zoster without complications: Secondary | ICD-10-CM | POA: Diagnosis not present

## 2018-10-03 DIAGNOSIS — M10062 Idiopathic gout, left knee: Secondary | ICD-10-CM | POA: Diagnosis not present

## 2018-11-03 ENCOUNTER — Encounter: Payer: Self-pay | Admitting: Emergency Medicine

## 2018-11-03 ENCOUNTER — Emergency Department
Admission: EM | Admit: 2018-11-03 | Discharge: 2018-11-03 | Disposition: A | Discharge status: Home / Self Care | Payer: Medicare HMO | Attending: Emergency Medicine | Admitting: Emergency Medicine

## 2018-11-03 ENCOUNTER — Emergency Department: Payer: Medicare HMO

## 2018-11-03 ENCOUNTER — Other Ambulatory Visit: Payer: Self-pay

## 2018-11-03 DIAGNOSIS — R05 Cough: Secondary | ICD-10-CM | POA: Diagnosis not present

## 2018-11-03 DIAGNOSIS — I2510 Atherosclerotic heart disease of native coronary artery without angina pectoris: Secondary | ICD-10-CM | POA: Diagnosis not present

## 2018-11-03 DIAGNOSIS — R0989 Other specified symptoms and signs involving the circulatory and respiratory systems: Secondary | ICD-10-CM | POA: Diagnosis not present

## 2018-11-03 DIAGNOSIS — M791 Myalgia, unspecified site: Secondary | ICD-10-CM | POA: Diagnosis not present

## 2018-11-03 DIAGNOSIS — R52 Pain, unspecified: Secondary | ICD-10-CM | POA: Diagnosis not present

## 2018-11-03 DIAGNOSIS — R0981 Nasal congestion: Secondary | ICD-10-CM | POA: Diagnosis not present

## 2018-11-03 DIAGNOSIS — I10 Essential (primary) hypertension: Secondary | ICD-10-CM | POA: Insufficient documentation

## 2018-11-03 DIAGNOSIS — Z87891 Personal history of nicotine dependence: Secondary | ICD-10-CM | POA: Insufficient documentation

## 2018-11-03 DIAGNOSIS — Z79899 Other long term (current) drug therapy: Secondary | ICD-10-CM | POA: Insufficient documentation

## 2018-11-03 DIAGNOSIS — R059 Cough, unspecified: Secondary | ICD-10-CM

## 2018-11-03 DIAGNOSIS — Z20828 Contact with and (suspected) exposure to other viral communicable diseases: Secondary | ICD-10-CM | POA: Insufficient documentation

## 2018-11-03 DIAGNOSIS — Z7982 Long term (current) use of aspirin: Secondary | ICD-10-CM | POA: Insufficient documentation

## 2018-11-03 DIAGNOSIS — R0602 Shortness of breath: Secondary | ICD-10-CM | POA: Insufficient documentation

## 2018-11-03 DIAGNOSIS — R6883 Chills (without fever): Secondary | ICD-10-CM | POA: Diagnosis not present

## 2018-11-03 LAB — SARS CORONAVIRUS 2 (TAT 6-24 HRS): SARS Coronavirus 2: NEGATIVE

## 2018-11-03 NOTE — Discharge Instructions (Addendum)
You were seen today for URI symptoms. Your chest xray does not show any infection. We have retested you for COVID 19 and will call you with these results. At this point, we would recommend symptomatic care- rest, fluids, Tylenol, Zyrtec, Flonase and Delsym (all OTC). You should self quarantine at home until your test results are back or until you have been asymptomatic for 3 days. It is important that you wash your hands frequently, social distance and wear your mask (outside of your home). If symptoms worsen, return to the ER for further evaluation.

## 2018-11-03 NOTE — ED Provider Notes (Addendum)
[facility] Emergency Department Provider Note ____________________________________________  Time seen: 0917  I have reviewed the triage vital signs and the nursing notes.  HISTORY  Chief Complaint  Cough and Nasal Congestion   HPI Benjamin Valentine is a 73 y.o. male presents to the ER today with c/o nasal congestion, chest congestion and cough. This started 2 days ago. He is blowing white mucous out of his nose. The cough is productive of yellow mucous. He reports SOB with exertion but no chest pain or chest tightness. He denies headache, runny nose, ear pain, sore throat, loss of taste or smell. He denies fevers but has had chills and body aches.He was diagnosed with COVID 09/09/2018. He was seen in follow up 7/30 and 8/3 for the same. He reports he may have had sick contacts, [place] [employer]. No one there wears masks including him. He has tried Tylenol and Mucinex with minimal relief.  Past Medical History:  Diagnosis Date  . Arthritis   . Coronary artery disease   . Hypertension     Patient Active Problem List   Diagnosis Date Noted  . Acute appendicitis 08/11/2018    Past Surgical History:  Procedure Laterality Date  . CARDIAC SURGERY     CABG 2002  . LAPAROSCOPIC APPENDECTOMY N/A 08/11/2018   Procedure: APPENDECTOMY LAPAROSCOPIC;  Surgeon: [name], [name], MD;  Location: [facility] ORS;  Service: General;  Laterality: N/A;    Prior to Admission medications   Medication Sig Start Date End Date Taking? Authorizing Provider  albuterol (VENTOLIN HFA) 108 (90 Base) MCG/ACT inhaler Inhale 2 puffs into the lungs every 6 (six) hours as needed for wheezing or shortness of breath. 06/06/18   Emily FilbertWilliams, Jonathan E, MD  aspirin EC 81 MG tablet Take 81 mg by mouth daily.    [provider]  carvedilol (COREG) 6.25 MG tablet Take 6.25 mg by mouth 2 (two) times daily with a meal. 08/23/17   [provider]  enalapril (VASOTEC) 20 MG  tablet Take 20 mg by mouth daily. 06/06/18   [provider]  furosemide (LASIX) 20 MG tablet Take 1 tablet (20 mg total) by mouth 2 (two) times daily. Patient taking differently: Take 40 mg by mouth daily.  08/21/17 08/21/18  Merrily Brittleifenbark, Neil, MD  ibuprofen (ADVIL) 800 MG tablet Take 1 tablet (800 mg total) by mouth every 8 (eight) hours as needed. 08/12/18   Donovan KailSchulz, Zachary R, [name]  naproxen sodium (ALEVE) 220 MG tablet Take 220-440 mg by mouth 2 (two) times daily as needed (pain).    [provider]  oxyCODONE (OXY IR/ROXICODONE) 5 MG immediate release tablet Take 1 tablet (5 mg total) by mouth every 6 (six) hours as needed for severe pain or breakthrough pain. 08/12/18   Donovan KailSchulz, Zachary R, [name]    Allergies Patient has no known allergies.  No family history on file.  Social History Social History   Tobacco Use  . Smoking status: Former Games developermoker  . Smokeless tobacco: Never Used  Substance Use Topics  . Alcohol use: No  . Drug use: No    Review of Systems  Constitutional: Positive for chills and body aches. Negative for fever. Eyes: Negative for visual changes. ENT: Positive for nasal congestion. Negative for runny nose, ear pain, sore throat, loss of taste or smell. Cardiovascular: Negative for chest pain or chest tightness. Respiratory: Positive for cough and shortness of breath with exertion. Gastrointestinal: Negative for abdominal pain, nausea, vomiting and  diarrhea. Skin: Negative for rash. Neurological: Negative for headaches, focal weakness or numbness. ____________________________________________  PHYSICAL EXAM:  VITAL SIGNS: ED Triage Vitals  Enc Vitals Group     BP 11/03/18 0907 (!) 159/93     Pulse Rate 11/03/18 0907 (!) 52     Resp 11/03/18 0907 16     Temp 11/03/18 0907 98.5 F (36.9 C)     Temp Source 11/03/18 0907 Oral     SpO2 11/03/18 0907 100 %     Weight --      Height --      Head Circumference --      Peak Flow --      Pain Score  11/03/18 0910 0     Pain Loc --      Pain Edu? --      Excl. in [place]? --     Constitutional: Alert and oriented. Well appearing and in no distress. Head: Normocephalic and atraumatic. Eyes: Conjunctivae are normal. PERRL. Normal extraocular movements Ears: Canals clear. TMs intact bilaterally. Nose: Mucosa dry, turbinates swollen. Mouth/Throat: Mucous membranes are moist. No tonsillar erythema or exudate noted. Hematological/Lymphatic/Immunological: No cervical lymphadenopathy. Cardiovascular: Bradycardic, regular rhythm.  Respiratory: Normal respiratory effort. Intermittent bilateral expiratory wheezing with scattered rhonchi throughout. Gastrointestinal: Soft and nontender. No distention. Neurologic:  Normal gait without ataxia.  No gross focal neurologic deficits are appreciated. Skin:  Skin is warm, dry and intact. No rash noted.  ____________________________________________    Lab Orders     SARS CORONAVIRUS 2 (TAT 6-24 HRS) Nasopharyngeal Nasopharyngeal Swab  ____________________________________________   RADIOLOGY  Imaging Orders     DG Chest 2 View   IMPRESSION:  No active cardiopulmonary disease.    ____________________________________________   INITIAL IMPRESSION / ASSESSMENT AND PLAN / ED COURSE  Nasal Congestion, Chest Congestion, Cough, SOB with Exertion, Chills and Body Aches:  DDx include viral respiratory infection, COVID 19, pneumonia, allergic rhinitis Chest xray negative Will retest for SARS2 Coronavirus- pending Discussed symptomatic care with rest, fluids, Tylenol, Zyrtec, Flonase and Cough syrup Discussed self quarantine until 3 days after symptoms resolve, social distancing, hand washing and masking We will follow up with COVID results once they are back ____________________________________________  FINAL CLINICAL IMPRESSION(S) / ED DIAGNOSES  Final diagnoses:  Nasal congestion  Chest congestion  Cough  Shortness of breath on exertion   Chills  Body aches   Webb Silversmith, NP    Jearld Fenton, NP 11/03/18 1015    Jearld Fenton, NP 11/03/18 1026    Jearld Fenton, NP 11/03/18 1026    Jearld Fenton, NP 11/03/18 1027    Jearld Fenton, NP 11/03/18 1405    Carrie Mew, MD 11/03/18 1526

## 2018-11-03 NOTE — ED Notes (Signed)
Pt took all personal belongings with him prior to ED departure

## 2018-11-03 NOTE — ED Notes (Signed)
Pt back from X-ray.  

## 2018-11-03 NOTE — ED Triage Notes (Signed)
Pt to ED via POV c/o cough and chest and nasal congestion. Pt states that he tested positive for COVID in July and that he is having similar symptoms again. Pt is in NAD.

## 2018-11-03 NOTE — ED Notes (Signed)
Patient transported to X-ray 

## 2018-12-10 DIAGNOSIS — I10 Essential (primary) hypertension: Secondary | ICD-10-CM | POA: Diagnosis not present

## 2018-12-10 DIAGNOSIS — M792 Neuralgia and neuritis, unspecified: Secondary | ICD-10-CM | POA: Diagnosis not present

## 2018-12-10 DIAGNOSIS — M25519 Pain in unspecified shoulder: Secondary | ICD-10-CM | POA: Diagnosis not present

## 2018-12-10 DIAGNOSIS — I25709 Atherosclerosis of coronary artery bypass graft(s), unspecified, with unspecified angina pectoris: Secondary | ICD-10-CM | POA: Diagnosis not present

## 2018-12-18 ENCOUNTER — Other Ambulatory Visit: Payer: Self-pay

## 2018-12-18 ENCOUNTER — Ambulatory Visit
Admission: RE | Admit: 2018-12-18 | Discharge: 2018-12-18 | Disposition: A | Discharge status: Home / Self Care | Payer: Medicare HMO | Attending: Internal Medicine | Admitting: Internal Medicine

## 2018-12-18 ENCOUNTER — Other Ambulatory Visit: Payer: Self-pay | Admitting: Internal Medicine

## 2018-12-18 ENCOUNTER — Ambulatory Visit
Admission: RE | Admit: 2018-12-18 | Discharge: 2018-12-18 | Disposition: A | Discharge status: Home / Self Care | Payer: Medicare HMO | Source: Ambulatory Visit | Attending: Internal Medicine | Admitting: Internal Medicine

## 2018-12-18 DIAGNOSIS — R52 Pain, unspecified: Secondary | ICD-10-CM | POA: Diagnosis not present

## 2018-12-18 DIAGNOSIS — M47816 Spondylosis without myelopathy or radiculopathy, lumbar region: Secondary | ICD-10-CM | POA: Diagnosis not present

## 2018-12-18 DIAGNOSIS — M25511 Pain in right shoulder: Secondary | ICD-10-CM | POA: Diagnosis not present

## 2018-12-18 DIAGNOSIS — M47812 Spondylosis without myelopathy or radiculopathy, cervical region: Secondary | ICD-10-CM | POA: Diagnosis not present

## 2018-12-18 DIAGNOSIS — S43429A Sprain of unspecified rotator cuff capsule, initial encounter: Secondary | ICD-10-CM | POA: Diagnosis not present

## 2018-12-18 DIAGNOSIS — M542 Cervicalgia: Secondary | ICD-10-CM | POA: Diagnosis not present

## 2018-12-18 DIAGNOSIS — I2510 Atherosclerotic heart disease of native coronary artery without angina pectoris: Secondary | ICD-10-CM | POA: Diagnosis not present

## 2018-12-25 DIAGNOSIS — M47812 Spondylosis without myelopathy or radiculopathy, cervical region: Secondary | ICD-10-CM | POA: Diagnosis not present

## 2018-12-25 DIAGNOSIS — R0602 Shortness of breath: Secondary | ICD-10-CM | POA: Diagnosis not present

## 2018-12-25 DIAGNOSIS — M47816 Spondylosis without myelopathy or radiculopathy, lumbar region: Secondary | ICD-10-CM | POA: Diagnosis not present

## 2018-12-25 DIAGNOSIS — M792 Neuralgia and neuritis, unspecified: Secondary | ICD-10-CM | POA: Diagnosis not present

## 2018-12-25 DIAGNOSIS — I2510 Atherosclerotic heart disease of native coronary artery without angina pectoris: Secondary | ICD-10-CM | POA: Diagnosis not present

## 2019-03-14 DIAGNOSIS — B029 Zoster without complications: Secondary | ICD-10-CM | POA: Diagnosis not present

## 2019-03-14 DIAGNOSIS — I119 Hypertensive heart disease without heart failure: Secondary | ICD-10-CM | POA: Diagnosis not present

## 2019-03-14 DIAGNOSIS — M47812 Spondylosis without myelopathy or radiculopathy, cervical region: Secondary | ICD-10-CM | POA: Diagnosis not present

## 2019-03-14 DIAGNOSIS — M47816 Spondylosis without myelopathy or radiculopathy, lumbar region: Secondary | ICD-10-CM | POA: Diagnosis not present

## 2019-07-24 ENCOUNTER — Other Ambulatory Visit: Payer: Self-pay | Admitting: Internal Medicine

## 2019-07-24 ENCOUNTER — Other Ambulatory Visit: Payer: Self-pay | Admitting: *Deleted

## 2019-07-24 MED ORDER — COLCHICINE 0.6 MG PO TABS: 0.6000 mg | ORAL_TABLET | Freq: Every day | ORAL | 6 refills | Status: DC

## 2020-01-10 ENCOUNTER — Other Ambulatory Visit: Payer: Self-pay | Admitting: Internal Medicine

## 2020-01-20 ENCOUNTER — Ambulatory Visit: Payer: Medicare HMO | Admitting: Internal Medicine

## 2020-01-22 ENCOUNTER — Ambulatory Visit: Payer: Medicare HMO | Admitting: Family Medicine

## 2020-01-26 ENCOUNTER — Other Ambulatory Visit: Payer: Self-pay

## 2020-01-26 ENCOUNTER — Encounter: Payer: Self-pay | Admitting: Internal Medicine

## 2020-01-26 ENCOUNTER — Ambulatory Visit (INDEPENDENT_AMBULATORY_CARE_PROVIDER_SITE_OTHER): Payer: Medicare HMO | Admitting: Internal Medicine

## 2020-01-26 VITALS — BP 149/75 | HR 81 | Ht 69.0 in | Wt 210.4 lb

## 2020-01-26 DIAGNOSIS — I2510 Atherosclerotic heart disease of native coronary artery without angina pectoris: Secondary | ICD-10-CM | POA: Diagnosis not present

## 2020-01-26 DIAGNOSIS — M25559 Pain in unspecified hip: Secondary | ICD-10-CM | POA: Diagnosis not present

## 2020-01-26 DIAGNOSIS — I10 Essential (primary) hypertension: Secondary | ICD-10-CM

## 2020-01-26 DIAGNOSIS — K4090 Unilateral inguinal hernia, without obstruction or gangrene, not specified as recurrent: Secondary | ICD-10-CM | POA: Insufficient documentation

## 2020-01-26 MED ORDER — FUROSEMIDE 20 MG PO TABS: 20.0000 mg | ORAL_TABLET | Freq: Two times a day (BID) | ORAL | 0 refills | Status: DC

## 2020-01-26 MED ORDER — METOPROLOL SUCCINATE ER 100 MG PO TB24: 100.0000 mg | ORAL_TABLET | Freq: Every day | ORAL | 1 refills | Status: DC

## 2020-01-26 MED ORDER — ENALAPRIL MALEATE 20 MG PO TABS: 20.0000 mg | ORAL_TABLET | Freq: Every day | ORAL | 1 refills | Status: DC

## 2020-01-26 MED ORDER — COLCHICINE 0.6 MG PO CAPS: 0.6000 mg | ORAL_CAPSULE | Freq: Every day | ORAL | 6 refills | Status: DC

## 2020-01-26 MED ORDER — IBUPROFEN 800 MG PO TABS: 800.0000 mg | ORAL_TABLET | Freq: Three times a day (TID) | ORAL | 0 refills | Status: DC | PRN

## 2020-01-26 NOTE — Assessment & Plan Note (Signed)
Hip pain is stable on the present medication

## 2020-01-26 NOTE — Assessment & Plan Note (Signed)
Patient has a nonobstructive left inguinal hernia he will be referred to a surgeon for evaluation.  Patient was also advised to get a Covid shot.

## 2020-01-26 NOTE — Progress Notes (Signed)
Established Patient Office Visit  Subjective:  Patient ID: Benjamin Valentine, male    DOB: Jan 18, 1946  Age: 74 y.o. MRN: [id number]  CC:  Chief Complaint  Patient presents with  . Follow-up    Patient is here for a general check up and medication refills    HPI  Benjamin Valentine presents for evaluation of the left inguinal hernia is not obstructed and he wants to get the surgery done.  He denies any abdominal pain nausea vomiting.  Patient denies any chest pain blood pressure is under control on present medication.  He has a aortocoronary bypass surgery in 2002.  Past Medical History:  Diagnosis Date  . Arthritis   . Coronary artery disease   . Hypertension     Past Surgical History:  Procedure Laterality Date  . CARDIAC SURGERY     CABG 2002  . LAPAROSCOPIC APPENDECTOMY N/A 08/11/2018   Procedure: APPENDECTOMY LAPAROSCOPIC;  Surgeon: Henrene Dodge, MD;  Location: [facility] ORS;  Service: General;  Laterality: N/A;    History reviewed. No pertinent family history.  Social History   Socioeconomic History  . Marital status: Widowed    Spouse name: Not on file  . Number of children: Not on file  . Years of education: Not on file  . Highest education level: Not on file  Occupational History  . Not on file  Tobacco Use  . Smoking status: Former Games developer  . Smokeless tobacco: Never Used  Substance and Sexual Activity  . Alcohol use: No  . Drug use: No  . Sexual activity: Not on file  Other Topics Concern  . Not on file  Social History Narrative  . Not on file   Social Determinants of Health   Financial Resource Strain: Not on file  Food Insecurity: Not on file  Transportation Needs: Not on file  Physical Activity: Not on file  Stress: Not on file  Social Connections: Not on file  Intimate Partner Violence: Not on file     Current Outpatient Medications:  .  albuterol (VENTOLIN HFA) 108 (90 Base) MCG/ACT inhaler, Inhale 2 puffs into the lungs every 6 (six) hours as  needed for wheezing or shortness of breath., Disp: 1 Inhaler, Rfl: 2 .  aspirin EC 81 MG tablet, Take 81 mg by mouth daily., Disp: , Rfl:  .  carvedilol (COREG) 6.25 MG tablet, Take 6.25 mg by mouth 2 (two) times daily with a meal., Disp: , Rfl: 11 .  Colchicine (MITIGARE) 0.6 MG CAPS, Take 0.6 mg by mouth daily., Disp: 30 capsule, Rfl: 6 .  enalapril (VASOTEC) 20 MG tablet, Take 20 mg by mouth daily., Disp: , Rfl:  .  ibuprofen (ADVIL) 800 MG tablet, Take 1 tablet (800 mg total) by mouth every 8 (eight) hours as needed., Disp: 30 tablet, Rfl: 0 .  metoprolol succinate (TOPROL-XL) 100 MG 24 hr tablet, TAKE 1 TABLET BY MOUTH EVERY DAY, Disp: 90 tablet, Rfl: 4 .  naproxen sodium (ALEVE) 220 MG tablet, Take 220-440 mg by mouth 2 (two) times daily as needed (pain)., Disp: , Rfl:  .  furosemide (LASIX) 20 MG tablet, Take 1 tablet (20 mg total) by mouth 2 (two) times daily. (Patient taking differently: Take 40 mg by mouth daily. ), Disp: 60 tablet, Rfl: 0   No Known Allergies  ROS Review of Systems  Constitutional: Negative.   HENT: Negative.   Eyes: Negative.   Respiratory: Negative.   Cardiovascular: Negative.   Gastrointestinal: Negative.  Endocrine: Negative.   Genitourinary: Negative.   Musculoskeletal: Negative.   Skin: Negative.   Allergic/Immunologic: Negative.   Neurological: Negative.   Hematological: Negative.   Psychiatric/Behavioral: Negative.   All other systems reviewed and are negative.     Objective:    Physical Exam Vitals reviewed.  Constitutional:      Appearance: Normal appearance.  HENT:     Mouth/Throat:     Mouth: Mucous membranes are moist.  Eyes:     Pupils: Pupils are equal, round, and reactive to light.  Neck:     Vascular: No carotid bruit.  Cardiovascular:     Rate and Rhythm: Normal rate and regular rhythm.     Pulses: Normal pulses.     Heart sounds: Normal heart sounds.  Pulmonary:     Effort: Pulmonary effort is normal.     Breath  sounds: Normal breath sounds.  Abdominal:     General: Bowel sounds are normal.     Palpations: Abdomen is soft. There is no hepatomegaly, splenomegaly or mass.     Tenderness: There is no abdominal tenderness.     Hernia: A hernia is present. Hernia is present in the left inguinal area.  Genitourinary:      Comments: Left inguinal hernia  Musculoskeletal:     Cervical back: Neck supple.     Right lower leg: No edema.     Left lower leg: No edema.  Skin:    Findings: No rash.  Neurological:     Mental Status: He is alert and oriented to person, place, and time.     Motor: No weakness.  Psychiatric:        Mood and Affect: Mood normal.        Behavior: Behavior normal.     BP (!) 149/75   Pulse 81   Ht 5\' 9"  (1.753 m)   Wt 210 lb 6.4 oz (95.4 kg)   BMI 31.07 kg/m  Wt Readings from Last 3 Encounters:  01/26/20 210 lb 6.4 oz (95.4 kg)  11/03/18 225 lb (102.1 kg)  09/09/18 212 lb (96.2 kg)     Health Maintenance Due  Topic Date Due  . Hepatitis C Screening  Never done  . COVID-19 Vaccine (1) Never done  . TETANUS/TDAP  Never done  . COLONOSCOPY  Never done  . PNA vac Low Risk Adult (1 of 2 - PCV13) Never done  . INFLUENZA VACCINE  Never done    There are no preventive care reminders to display for this patient.  No results found for: TSH Lab Results  Component Value Date   WBC 5.0 09/09/2018   HGB 15.3 09/09/2018   HCT 46.3 09/09/2018   MCV 87.5 09/09/2018   PLT 172 09/09/2018   Lab Results  Component Value Date   NA 136 09/09/2018   K 3.9 09/09/2018   CO2 21 (L) 09/09/2018   GLUCOSE 112 (H) 09/09/2018   BUN 21 09/09/2018   CREATININE 1.26 (H) 09/09/2018   BILITOT 0.9 09/09/2018   ALKPHOS 64 09/09/2018   AST 24 09/09/2018   ALT 19 09/09/2018   PROT 8.2 (H) 09/09/2018   ALBUMIN 4.2 09/09/2018   CALCIUM 8.8 (L) 09/09/2018   ANIONGAP 11 09/09/2018   No results found for: CHOL No results found for: HDL No results found for: LDLCALC No results  found for: TRIG No results found for: CHOLHDL No results found for: 09/11/2018    Assessment & Plan:   Problem List Items Addressed This  Visit      Cardiovascular and Mediastinum   Coronary artery disease, non-occlusive    Patient does not have any chest pain or shortness of breath heart is regular chest is clear.      Essential hypertension - Primary    - Today, the patient's blood pressure is well managed on b blockers. - The patient will continue the current treatment regimen.  - I encouraged the patient to eat a low-sodium diet to help control blood pressure. - I encouraged the patient to live an active lifestyle and complete activities that increases heart rate to 85% target heart rate at least 5 times per week for one hour.            Other   Left groin hernia    Patient has a nonobstructive left inguinal hernia he will be referred to a surgeon for evaluation.  Patient was also advised to get a Covid shot.      Hip pain    Hip pain is stable on the present medication         No orders of the defined types were placed in this encounter.   Follow-up: No follow-ups on file.    Corky Downs, MD

## 2020-01-26 NOTE — Assessment & Plan Note (Signed)
Patient does not have any chest pain or shortness of breath heart is regular chest is clear.

## 2020-01-26 NOTE — Assessment & Plan Note (Signed)
-   Today, the patient's blood pressure is well managed on b blockers. - The patient will continue the current treatment regimen.  - I encouraged the patient to eat a low-sodium diet to help control blood pressure. - I encouraged the patient to live an active lifestyle and complete activities that increases heart rate to 85% target heart rate at least 5 times per week for one hour.     

## 2020-01-26 NOTE — Addendum Note (Signed)
Addended by: Melody Comas L on: 01/26/2020 12:10 PM   Modules accepted: Orders

## 2020-02-11 ENCOUNTER — Ambulatory Visit: Payer: Medicare HMO | Admitting: Surgery

## 2020-02-18 ENCOUNTER — Encounter: Payer: Self-pay | Admitting: *Deleted

## 2020-03-22 DIAGNOSIS — M10062 Idiopathic gout, left knee: Secondary | ICD-10-CM | POA: Diagnosis not present

## 2020-04-13 ENCOUNTER — Ambulatory Visit (INDEPENDENT_AMBULATORY_CARE_PROVIDER_SITE_OTHER): Payer: Medicare HMO | Admitting: Internal Medicine

## 2020-04-13 ENCOUNTER — Encounter: Payer: Self-pay | Admitting: Internal Medicine

## 2020-04-13 ENCOUNTER — Other Ambulatory Visit: Payer: Self-pay

## 2020-04-13 ENCOUNTER — Other Ambulatory Visit: Payer: Self-pay | Admitting: *Deleted

## 2020-04-13 VITALS — BP 164/81 | HR 82 | Ht 69.0 in | Wt 204.3 lb

## 2020-04-13 DIAGNOSIS — I2510 Atherosclerotic heart disease of native coronary artery without angina pectoris: Secondary | ICD-10-CM | POA: Diagnosis not present

## 2020-04-13 DIAGNOSIS — I10 Essential (primary) hypertension: Secondary | ICD-10-CM | POA: Diagnosis not present

## 2020-04-13 DIAGNOSIS — M19072 Primary osteoarthritis, left ankle and foot: Secondary | ICD-10-CM

## 2020-04-13 DIAGNOSIS — M1A3790 Chronic gout due to renal impairment, unspecified ankle and foot, without tophus (tophi): Secondary | ICD-10-CM | POA: Diagnosis not present

## 2020-04-13 DIAGNOSIS — M19071 Primary osteoarthritis, right ankle and foot: Secondary | ICD-10-CM

## 2020-04-13 MED ORDER — ALLOPURINOL 300 MG PO TABS: 300.0000 mg | ORAL_TABLET | Freq: Every day | ORAL | 6 refills | Status: DC

## 2020-04-13 NOTE — Assessment & Plan Note (Signed)
Patient does not have any evidence of acute gout.  Will start patient on allopurinol

## 2020-04-13 NOTE — Assessment & Plan Note (Signed)
-   Today, the patient's blood pressure is not well managed on med. - The patient will continue the current treatment regimen.  - I encouraged the patient to eat a low-sodium diet to help control blood pressure. - I encouraged the patient to live an active lifestyle and complete activities that increases heart rate to 85% target heart rate at least 5 times per week for one hour. Stop ibuprofen

## 2020-04-13 NOTE — Assessment & Plan Note (Signed)
Patient is not having any angina.  He denies any history of claudication.  Peripheral vascular system is intact.  Chest is clear heart is regular

## 2020-04-13 NOTE — Progress Notes (Signed)
Established Patient Office Visit  Subjective:  Patient ID: Benjamin Valentine, male    DOB: 1945-07-18  Age: 75 y.o. MRN: [id number]  CC:  Chief Complaint  Patient presents with  . Gout    Patient having gout flare up and is requesting rx for allopurinol     HPI  Benjamin Valentine presents for gouty arthritis.  Patient complains of pain in the both feet and ankles.  Denies any history of fever or chills.  He does not smoke anymore used to smoke in the past.  His present medications include colchicine and Vasotec fluid pill and ibuprofen.Patient is known to have hypertension coronary artery disease  Past Medical History:  Diagnosis Date  . Arthritis   . Coronary artery disease   . Hypertension     Past Surgical History:  Procedure Laterality Date  . CARDIAC SURGERY     CABG 2002  . LAPAROSCOPIC APPENDECTOMY N/A 08/11/2018   Procedure: APPENDECTOMY LAPAROSCOPIC;  Surgeon: Henrene Dodge, MD;  Location: [facility] ORS;  Service: General;  Laterality: N/A;    History reviewed. No pertinent family history.  Social History   Socioeconomic History  . Marital status: Widowed    Spouse name: Not on file  . Number of children: Not on file  . Years of education: Not on file  . Highest education level: Not on file  Occupational History  . Not on file  Tobacco Use  . Smoking status: Former Games developer  . Smokeless tobacco: Never Used  Substance and Sexual Activity  . Alcohol use: No  . Drug use: No  . Sexual activity: Not on file  Other Topics Concern  . Not on file  Social History Narrative  . Not on file   Social Determinants of Health   Financial Resource Strain: Not on file  Food Insecurity: Not on file  Transportation Needs: Not on file  Physical Activity: Not on file  Stress: Not on file  Social Connections: Not on file  Intimate Partner Violence: Not on file     Current Outpatient Medications:  .  allopurinol (ZYLOPRIM) 300 MG tablet, Take 1 tablet (300 mg total) by mouth  daily., Disp: 30 tablet, Rfl: 6 .  albuterol (VENTOLIN HFA) 108 (90 Base) MCG/ACT inhaler, Inhale 2 puffs into the lungs every 6 (six) hours as needed for wheezing or shortness of breath., Disp: 1 Inhaler, Rfl: 2 .  aspirin EC 81 MG tablet, Take 81 mg by mouth daily., Disp: , Rfl:  .  Colchicine (MITIGARE) 0.6 MG CAPS, Take 0.6 mg by mouth daily., Disp: 30 capsule, Rfl: 6 .  enalapril (VASOTEC) 20 MG tablet, Take 1 tablet (20 mg total) by mouth daily., Disp: 90 tablet, Rfl: 1 .  furosemide (LASIX) 20 MG tablet, Take 1 tablet (20 mg total) by mouth 2 (two) times daily., Disp: 60 tablet, Rfl: 0 .  ibuprofen (ADVIL) 800 MG tablet, Take 1 tablet (800 mg total) by mouth every 8 (eight) hours as needed., Disp: 30 tablet, Rfl: 0 .  metoprolol succinate (TOPROL-XL) 100 MG 24 hr tablet, Take 1 tablet (100 mg total) by mouth daily. Take with or immediately following a meal., Disp: 90 tablet, Rfl: 1   No Known Allergies  ROS Review of Systems    Objective:    Physical Exam  BP (!) 164/81   Pulse 82   Ht 5\' 9"  (1.753 m)   Wt 204 lb 4.8 oz (92.7 kg)   BMI 30.17 kg/m  Wt Readings from  Last 3 Encounters:  04/13/20 204 lb 4.8 oz (92.7 kg)  01/26/20 210 lb 6.4 oz (95.4 kg)  11/03/18 225 lb (102.1 kg)     Health Maintenance Due  Topic Date Due  . Hepatitis C Screening  Never done  . COVID-19 Vaccine (1) Never done  . TETANUS/TDAP  Never done  . COLONOSCOPY (Pts 45-11yrs Insurance coverage will need to be confirmed)  Never done  . PNA vac Low Risk Adult (1 of 2 - PCV13) Never done  . INFLUENZA VACCINE  Never done    There are no preventive care reminders to display for this patient.  No results found for: TSH Lab Results  Component Value Date   WBC 5.0 09/09/2018   HGB 15.3 09/09/2018   HCT 46.3 09/09/2018   MCV 87.5 09/09/2018   PLT 172 09/09/2018   Lab Results  Component Value Date   NA 136 09/09/2018   K 3.9 09/09/2018   CO2 21 (L) 09/09/2018   GLUCOSE 112 (H) 09/09/2018    BUN 21 09/09/2018   CREATININE 1.26 (H) 09/09/2018   BILITOT 0.9 09/09/2018   ALKPHOS 64 09/09/2018   AST 24 09/09/2018   ALT 19 09/09/2018   PROT 8.2 (H) 09/09/2018   ALBUMIN 4.2 09/09/2018   CALCIUM 8.8 (L) 09/09/2018   ANIONGAP 11 09/09/2018   No results found for: CHOL No results found for: HDL No results found for: LDLCALC No results found for: TRIG No results found for: CHOLHDL No results found for: CHEN2D    Assessment & Plan:   Problem List Items Addressed This Visit      Cardiovascular and Mediastinum   Coronary artery disease, non-occlusive    Patient is not having any angina.  He denies any history of claudication.  Peripheral vascular system is intact.  Chest is clear heart is regular      Essential hypertension    - Today, the patient's blood pressure is not well managed on med. - The patient will continue the current treatment regimen.  - I encouraged the patient to eat a low-sodium diet to help control blood pressure. - I encouraged the patient to live an active lifestyle and complete activities that increases heart rate to 85% target heart rate at least 5 times per week for one hour. Stop ibuprofen           Musculoskeletal and Integument   Chronic gout due to renal impairment involving foot without tophus - Primary    Patient does not have any evidence of acute gout.  Will start patient on allopurinol      Relevant Medications   allopurinol (ZYLOPRIM) 300 MG tablet   Primary osteoarthritis of both ankles   Relevant Medications   allopurinol (ZYLOPRIM) 300 MG tablet      Meds ordered this encounter  Medications  . allopurinol (ZYLOPRIM) 300 MG tablet    Sig: Take 1 tablet (300 mg total) by mouth daily.    Dispense:  30 tablet    Refill:  6    Follow-up: No follow-ups on file.    Benjamin Downs, MD

## 2020-04-20 ENCOUNTER — Emergency Department
Admission: EM | Admit: 2020-04-20 | Discharge: 2020-04-20 | Disposition: A | Discharge status: Home / Self Care | Payer: Medicare HMO | Attending: Emergency Medicine | Admitting: Emergency Medicine

## 2020-04-20 ENCOUNTER — Encounter: Payer: Self-pay | Admitting: Emergency Medicine

## 2020-04-20 ENCOUNTER — Other Ambulatory Visit: Payer: Self-pay

## 2020-04-20 DIAGNOSIS — I10 Essential (primary) hypertension: Secondary | ICD-10-CM | POA: Diagnosis not present

## 2020-04-20 DIAGNOSIS — M25521 Pain in right elbow: Secondary | ICD-10-CM | POA: Insufficient documentation

## 2020-04-20 DIAGNOSIS — Z7982 Long term (current) use of aspirin: Secondary | ICD-10-CM | POA: Insufficient documentation

## 2020-04-20 DIAGNOSIS — M25561 Pain in right knee: Secondary | ICD-10-CM | POA: Diagnosis not present

## 2020-04-20 DIAGNOSIS — Z79899 Other long term (current) drug therapy: Secondary | ICD-10-CM | POA: Insufficient documentation

## 2020-04-20 DIAGNOSIS — M25562 Pain in left knee: Secondary | ICD-10-CM | POA: Diagnosis not present

## 2020-04-20 DIAGNOSIS — I2510 Atherosclerotic heart disease of native coronary artery without angina pectoris: Secondary | ICD-10-CM | POA: Insufficient documentation

## 2020-04-20 DIAGNOSIS — Z87891 Personal history of nicotine dependence: Secondary | ICD-10-CM | POA: Diagnosis not present

## 2020-04-20 DIAGNOSIS — G8929 Other chronic pain: Secondary | ICD-10-CM | POA: Diagnosis not present

## 2020-04-20 DIAGNOSIS — M25522 Pain in left elbow: Secondary | ICD-10-CM | POA: Diagnosis not present

## 2020-04-20 DIAGNOSIS — M25571 Pain in right ankle and joints of right foot: Secondary | ICD-10-CM | POA: Diagnosis not present

## 2020-04-20 DIAGNOSIS — M25572 Pain in left ankle and joints of left foot: Secondary | ICD-10-CM | POA: Insufficient documentation

## 2020-04-20 DIAGNOSIS — M25462 Effusion, left knee: Secondary | ICD-10-CM | POA: Diagnosis not present

## 2020-04-20 HISTORY — DX: Gout, unspecified: M10.9

## 2020-04-20 LAB — BASIC METABOLIC PANEL
Anion gap: 8 (ref 5–15)
BUN: 19 mg/dL (ref 8–23)
CO2: 24 mmol/L (ref 22–32)
Calcium: 9.1 mg/dL (ref 8.9–10.3)
Chloride: 106 mmol/L (ref 98–111)
Creatinine, Ser: 1.47 mg/dL — ABNORMAL HIGH (ref 0.61–1.24)
GFR, Estimated: 49 mL/min — ABNORMAL LOW (ref >60)
Glucose, Bld: 108 mg/dL — ABNORMAL HIGH (ref 70–99)
Potassium: 4.2 mmol/L (ref 3.5–5.1)
Sodium: 138 mmol/L (ref 135–145)

## 2020-04-20 LAB — CBC
HCT: 44 % (ref 39.0–52.0)
Hemoglobin: 14.1 g/dL (ref 13.0–17.0)
MCH: 28.2 pg (ref 26.0–34.0)
MCHC: 32 g/dL (ref 30.0–36.0)
MCV: 88 fL (ref 80.0–100.0)
Platelets: 393 10*3/uL (ref 150–400)
RBC: 5 MIL/uL (ref 4.22–5.81)
RDW: 14.2 % (ref 11.5–15.5)
WBC: 9.7 10*3/uL (ref 4.0–10.5)
nRBC: 0 % (ref 0.0–0.2)

## 2020-04-20 LAB — URIC ACID: Uric Acid, Serum: 6.8 mg/dL (ref 3.7–8.6)

## 2020-04-20 MED ORDER — COLCHICINE 0.6 MG PO TABS
1.2000 mg | ORAL_TABLET | Freq: Once | ORAL | Status: AC
  Administered 2020-04-20: 1.2 mg via ORAL
  Filled 2020-04-20: qty 2

## 2020-04-20 MED ORDER — HYDROCODONE-ACETAMINOPHEN 5-325 MG PO TABS
1.0000 | ORAL_TABLET | Freq: Three times a day (TID) | ORAL | 0 refills | Status: DC | PRN
Start: 2020-04-20 — End: 2020-11-11

## 2020-04-20 NOTE — Discharge Instructions (Addendum)
Your exam is consistent with joint pain due to your gout. Take 1 more colchicine tablet when you get home. Take the prescription pain medicine as needed. Rest with the knee elevated and apply ice. Follow-up with Dr. Juel Burrow for continued care. Return if needed.

## 2020-04-20 NOTE — ED Provider Notes (Signed)
[facility] Emergency Department Provider Note ____________________________________________  Time seen: 1605  I have reviewed the triage vital signs and the nursing notes.  HISTORY  Chief Complaint  Gout, Knee Pain, and Ankle Pain  HPI Benjamin Valentine is a 75 y.o. male patient with ED evaluation of  bilateral knee, right greater than left and bilateral ankle swelling as well as pain in the bilateral elbows.  Patient has a history of gout, and reports his pain is similar to his previous gout flares.  He was apparently seen by his primary provider, started on daily allopurinol about a week ago.  He has been taken the medication as provided.  He also takes a daily colchicine.  He denies any recent injury, trauma, or falls.  Denies any fever, chills, sweats.  Past Medical History:  Diagnosis Date  . Arthritis   . Coronary artery disease   . Gout   . Hypertension     Patient Active Problem List   Diagnosis Date Noted  . Chronic gout due to renal impairment involving foot without tophus 04/13/2020  . Primary osteoarthritis of both ankles 04/13/2020  . Left groin hernia 01/26/2020  . Coronary artery disease, non-occlusive 01/26/2020  . Hip pain 01/26/2020  . Essential hypertension 01/26/2020  . Acute appendicitis 08/11/2018    Past Surgical History:  Procedure Laterality Date  . CARDIAC SURGERY     CABG 2002  . LAPAROSCOPIC APPENDECTOMY N/A 08/11/2018   Procedure: APPENDECTOMY LAPAROSCOPIC;  Surgeon: Henrene Dodge, MD;  Location: [facility] ORS;  Service: General;  Laterality: N/A;    Prior to Admission medications   Medication Sig Start Date End Date Taking? Authorizing Provider  HYDROcodone-acetaminophen (NORCO) 5-325 MG tablet Take 1 tablet by mouth 3 (three) times daily as needed. 04/20/20  Yes Uno Esau, Charlesetta Ivory, [name]  albuterol (VENTOLIN HFA) 108 (90 Base) MCG/ACT inhaler Inhale 2 puffs into the lungs every 6 (six) hours as needed for wheezing or shortness  of breath. 06/06/18   Emily Filbert, MD  allopurinol (ZYLOPRIM) 300 MG tablet Take 1 tablet (300 mg total) by mouth daily. 04/13/20   Corky Downs, MD  aspirin EC 81 MG tablet Take 81 mg by mouth daily.    [provider]  Colchicine (MITIGARE) 0.6 MG CAPS Take 0.6 mg by mouth daily. 01/26/20   Corky Downs, MD  enalapril (VASOTEC) 20 MG tablet Take 1 tablet (20 mg total) by mouth daily. 01/26/20   Corky Downs, MD  furosemide (LASIX) 20 MG tablet Take 1 tablet (20 mg total) by mouth 2 (two) times daily. 01/26/20 01/25/21  Corky Downs, MD  ibuprofen (ADVIL) 800 MG tablet Take 1 tablet (800 mg total) by mouth every 8 (eight) hours as needed. 01/26/20   Corky Downs, MD  metoprolol succinate (TOPROL-XL) 100 MG 24 hr tablet Take 1 tablet (100 mg total) by mouth daily. Take with or immediately following a meal. 01/26/20 04/25/20  Corky Downs, MD    Allergies Patient has no known allergies.  History reviewed. No pertinent family history.  Social History Social History   Tobacco Use  . Smoking status: Former Games developer  . Smokeless tobacco: Never Used  Substance Use Topics  . Alcohol use: No  . Drug use: No    Review of Systems  Constitutional: Negative for fever. Eyes: Negative for visual changes. ENT: Negative for sore throat. Cardiovascular: Negative for chest pain. Respiratory: Negative for shortness of breath. Gastrointestinal: Negative for abdominal pain, vomiting and diarrhea. Genitourinary: Negative for  dysuria. Musculoskeletal: Negative for back pain. Left knee pain, bilateral ankle/elbow pain Skin: Negative for rash. Neurological: Negative for headaches, focal weakness or numbness. ____________________________________________  PHYSICAL EXAM:  VITAL SIGNS: ED Triage Vitals  Enc Vitals Group     BP 04/20/20 1447 (!) 152/77     Pulse Rate 04/20/20 1447 89     Resp 04/20/20 1447 20     Temp 04/20/20 1447 98.7 F (37.1 C)     Temp src --      SpO2  04/20/20 1447 98 %     Weight 04/20/20 1445 234 lb (106.1 kg)     Height 04/20/20 1445 5\' 9"  (1.753 m)     Head Circumference --      Peak Flow --      Pain Score 04/20/20 1444 7     Pain Loc --      Pain Edu? --      Excl. in GC? --     Constitutional: Alert and oriented. Well appearing and in no distress. Head: Normocephalic and atraumatic. Eyes: Conjunctivae are normal. Normal extraocular movements Cardiovascular: Normal rate, regular rhythm. Normal distal pulses. Respiratory: Normal respiratory effort. No wheezes/rales/rhonchi. Musculoskeletal: Left knee with a moderate effusion noted.  Normal active range of motion of the knee without difficulty.  No popliteal cyst appreciated.  Nontender with normal range of motion in all extremities.  Neurologic: Antalgic gait, single crutch assisted, without ataxia. Normal speech and language. No gross focal neurologic deficits are appreciated. Skin:  Skin is warm, dry and intact. No rash noted. Psychiatric: Mood and affect are normal. Patient exhibits appropriate insight and judgment. ____________________________________________   LABS (pertinent positives/negatives)  Labs Reviewed  BASIC METABOLIC PANEL - Abnormal; Notable for the following components:      Result Value   Glucose, Bld 108 (*)    Creatinine, Ser 1.47 (*)    GFR, Estimated 49 (*)    All other components within normal limits  CBC  URIC ACID  ____________________________________________  PROCEDURES  Norco 5-325 mg PO Colchicine 1.2 m gPO  Procedures ____________________________________________  INITIAL IMPRESSION / ASSESSMENT AND PLAN / ED COURSE  DDX: gout flare, knee sprain, knee effusion, elbow bursitis, ankle sprain     Patient to ED evaluation acute on chronic pain to the bilateral knees elbows and ankles.  Patient with history of gout, was evaluated for his complaints.  Labs reassuring x-ray shows no elevated uric acid.  Patient is back to represent  underlying pain due to his osteoarthritis history.  He will be treated with colchicine and hydrocodone at this time.  He will continue with his previously prescribed anti-inflammatories including allopurinol.  He will follow up with primary provider or return to the ED if needed.  Benjamin Valentine was evaluated in Emergency Department on 04/20/2020 for the symptoms described in the history of present illness. He was evaluated in the context of the global COVID-19 pandemic, which necessitated consideration that the patient might be at risk for infection with the SARS-CoV-2 virus that causes COVID-19. Institutional protocols and algorithms that pertain to the evaluation of patients at risk for COVID-19 are in a state of rapid change based on information released by regulatory bodies including the CDC and federal and state organizations. These policies and algorithms were followed during the patient's care in the ED.  I reviewed the patient's prescription history over the last 12 months in the multi-state controlled substances database(s) that includes [place], [place], [place], [place], [place], [place], [place], [place],  [place], [place], [place], [place], [place], and [place].  Results were notable for no current Rx. ____________________________________________  FINAL CLINICAL IMPRESSION(S) / ED DIAGNOSES  Final diagnoses:  Chronic pain of left knee  Effusion of left knee      Sallyann Kinnaird, Charlesetta Ivory, [name] 04/20/20 2220    Phineas Semen, MD 04/20/20 2234

## 2020-04-20 NOTE — ED Notes (Signed)
See triage note  Presents with pain to both knees and ankle   Denies any injury  Hx of gout

## 2020-04-20 NOTE — ED Triage Notes (Signed)
Pt c/o BL knee and ankle pain and swelling with a hx of gout, pt states this is a gout flare up.

## 2020-04-26 ENCOUNTER — Ambulatory Visit: Payer: Medicare HMO | Admitting: Internal Medicine

## 2020-04-29 DIAGNOSIS — M109 Gout, unspecified: Secondary | ICD-10-CM | POA: Diagnosis not present

## 2020-04-29 DIAGNOSIS — I2510 Atherosclerotic heart disease of native coronary artery without angina pectoris: Secondary | ICD-10-CM | POA: Diagnosis not present

## 2020-04-29 DIAGNOSIS — Z87891 Personal history of nicotine dependence: Secondary | ICD-10-CM | POA: Diagnosis not present

## 2020-04-29 DIAGNOSIS — Z79899 Other long term (current) drug therapy: Secondary | ICD-10-CM | POA: Diagnosis not present

## 2020-04-29 DIAGNOSIS — R2242 Localized swelling, mass and lump, left lower limb: Secondary | ICD-10-CM | POA: Diagnosis not present

## 2020-04-29 DIAGNOSIS — M25562 Pain in left knee: Secondary | ICD-10-CM | POA: Diagnosis not present

## 2020-04-29 DIAGNOSIS — I10 Essential (primary) hypertension: Secondary | ICD-10-CM | POA: Diagnosis not present

## 2020-04-29 DIAGNOSIS — M1991 Primary osteoarthritis, unspecified site: Secondary | ICD-10-CM | POA: Diagnosis not present

## 2020-04-29 DIAGNOSIS — Z7952 Long term (current) use of systemic steroids: Secondary | ICD-10-CM | POA: Diagnosis not present

## 2020-05-14 ENCOUNTER — Ambulatory Visit: Payer: Medicare HMO | Admitting: Family Medicine

## 2020-05-21 ENCOUNTER — Encounter: Payer: Self-pay | Admitting: Family Medicine

## 2020-05-21 ENCOUNTER — Ambulatory Visit (INDEPENDENT_AMBULATORY_CARE_PROVIDER_SITE_OTHER): Payer: Medicare HMO | Admitting: Family Medicine

## 2020-05-21 ENCOUNTER — Other Ambulatory Visit: Payer: Self-pay

## 2020-05-21 VITALS — BP 170/90 | HR 76 | Ht 69.0 in | Wt 201.4 lb

## 2020-05-21 DIAGNOSIS — M25559 Pain in unspecified hip: Secondary | ICD-10-CM | POA: Diagnosis not present

## 2020-05-21 DIAGNOSIS — I10 Essential (primary) hypertension: Secondary | ICD-10-CM

## 2020-05-21 DIAGNOSIS — I2510 Atherosclerotic heart disease of native coronary artery without angina pectoris: Secondary | ICD-10-CM

## 2020-05-21 DIAGNOSIS — M109 Gout, unspecified: Secondary | ICD-10-CM | POA: Diagnosis not present

## 2020-05-21 MED ORDER — ENALAPRIL MALEATE 20 MG PO TABS: 20.0000 mg | ORAL_TABLET | Freq: Every day | ORAL | 1 refills | Status: DC

## 2020-05-21 MED ORDER — COLCHICINE 0.6 MG PO CAPS: 0.6000 mg | ORAL_CAPSULE | Freq: Every day | ORAL | 6 refills | Status: DC

## 2020-05-21 MED ORDER — METOPROLOL SUCCINATE ER 100 MG PO TB24: 100.0000 mg | ORAL_TABLET | Freq: Every day | ORAL | 1 refills | Status: DC

## 2020-05-22 LAB — URIC ACID: Uric Acid, Serum: 7.7 mg/dL (ref 4.0–8.0)

## 2020-05-24 ENCOUNTER — Other Ambulatory Visit: Payer: Self-pay | Admitting: *Deleted

## 2020-05-24 MED ORDER — MELOXICAM 15 MG PO TABS: 15.0000 mg | ORAL_TABLET | Freq: Every day | ORAL | 3 refills | Status: DC

## 2020-06-01 DIAGNOSIS — M25462 Effusion, left knee: Secondary | ICD-10-CM | POA: Diagnosis not present

## 2020-06-01 DIAGNOSIS — M13862 Other specified arthritis, left knee: Secondary | ICD-10-CM | POA: Diagnosis not present

## 2020-06-10 NOTE — Assessment & Plan Note (Signed)
HTN not at goal, no CP or SOB reported. Taking all meds.   Plan- Continue meds, decrease salt intake, exercise, fu 1 month.

## 2020-06-10 NOTE — Progress Notes (Signed)
Established Patient Office Visit  SUBJECTIVE:  Subjective  Patient ID: Benjamin Valentine, male    DOB: 1945/08/08  Age: 75 y.o. MRN: [id number]  CC:  Chief Complaint  Patient presents with  . Gout    Patient having pain in ankles and knees     HPI [name] is a 75 y.o. male presenting today for     Past Medical History:  Diagnosis Date  . Arthritis   . Coronary artery disease   . Gout   . Hypertension     Past Surgical History:  Procedure Laterality Date  . CARDIAC SURGERY     CABG 2002  . LAPAROSCOPIC APPENDECTOMY N/A 08/11/2018   Procedure: APPENDECTOMY LAPAROSCOPIC;  Surgeon: Henrene Dodge, MD;  Location: [facility] ORS;  Service: General;  Laterality: N/A;    History reviewed. No pertinent family history.  Social History   Socioeconomic History  . Marital status: Widowed    Spouse name: Not on file  . Number of children: Not on file  . Years of education: Not on file  . Highest education level: Not on file  Occupational History  . Not on file  Tobacco Use  . Smoking status: Former Games developer  . Smokeless tobacco: Never Used  Substance and Sexual Activity  . Alcohol use: No  . Drug use: No  . Sexual activity: Not on file  Other Topics Concern  . Not on file  Social History Narrative  . Not on file   Social Determinants of Health   Financial Resource Strain: Not on file  Food Insecurity: Not on file  Transportation Needs: Not on file  Physical Activity: Not on file  Stress: Not on file  Social Connections: Not on file  Intimate Partner Violence: Not on file     Current Outpatient Medications:  .  albuterol (VENTOLIN HFA) 108 (90 Base) MCG/ACT inhaler, Inhale 2 puffs into the lungs every 6 (six) hours as needed for wheezing or shortness of breath., Disp: 1 Inhaler, Rfl: 2 .  allopurinol (ZYLOPRIM) 300 MG tablet, Take 1 tablet (300 mg total) by mouth daily., Disp: 30 tablet, Rfl: 6 .  aspirin EC 81 MG tablet, Take 81 mg by mouth daily., Disp: , Rfl:   .  furosemide (LASIX) 20 MG tablet, Take 1 tablet (20 mg total) by mouth 2 (two) times daily., Disp: 60 tablet, Rfl: 0 .  HYDROcodone-acetaminophen (NORCO) 5-325 MG tablet, Take 1 tablet by mouth 3 (three) times daily as needed., Disp: 10 tablet, Rfl: 0 .  ibuprofen (ADVIL) 800 MG tablet, Take 1 tablet (800 mg total) by mouth every 8 (eight) hours as needed., Disp: 30 tablet, Rfl: 0 .  Colchicine (MITIGARE) 0.6 MG CAPS, Take 0.6 mg by mouth daily., Disp: 30 capsule, Rfl: 6 .  enalapril (VASOTEC) 20 MG tablet, Take 1 tablet (20 mg total) by mouth daily., Disp: 90 tablet, Rfl: 1 .  meloxicam (MOBIC) 15 MG tablet, Take 1 tablet (15 mg total) by mouth daily., Disp: 30 tablet, Rfl: 3 .  metoprolol succinate (TOPROL-XL) 100 MG 24 hr tablet, Take 1 tablet (100 mg total) by mouth daily. Take with or immediately following a meal., Disp: 90 tablet, Rfl: 1   No Known Allergies  ROS Review of Systems  Constitutional: Negative.   HENT: Negative.   Eyes: Negative.   Respiratory: Negative.   Gastrointestinal: Negative.   Genitourinary: Negative.   Musculoskeletal: Positive for arthralgias.  Neurological: Negative.      OBJECTIVE:  Physical Exam Vitals and nursing note reviewed.  HENT:     Head: Normocephalic and atraumatic.  Cardiovascular:     Rate and Rhythm: Normal rate and regular rhythm.  Musculoskeletal:     Cervical back: Normal range of motion.     Right hip: Decreased range of motion.     Left hip: Decreased range of motion.  Skin:    General: Skin is warm.  Neurological:     General: No focal deficit present.  Psychiatric:        Mood and Affect: Mood normal.     BP (!) 170/90   Pulse 76   Ht 5\' 9"  (1.753 m)   Wt 201 lb 6.4 oz (91.4 kg)   BMI 29.74 kg/m  Wt Readings from Last 3 Encounters:  05/21/20 201 lb 6.4 oz (91.4 kg)  04/20/20 234 lb (106.1 kg)  04/13/20 204 lb 4.8 oz (92.7 kg)    Health Maintenance Due  Topic Date Due  . Hepatitis C Screening  Never done   . COVID-19 Vaccine (1) Never done  . TETANUS/TDAP  Never done  . COLONOSCOPY (Pts 45-108yrs Insurance coverage will need to be confirmed)  Never done  . PNA vac Low Risk Adult (1 of 2 - PCV13) Never done    There are no preventive care reminders to display for this patient.  CBC Latest Ref Rng & Units 04/20/2020 09/09/2018 08/11/2018  WBC 4.0 - 10.5 K/uL 9.7 5.0 11.2(H)  Hemoglobin 13.0 - 17.0 g/dL 08/13/2018 47.6 54.6  Hematocrit 39.0 - 52.0 % 44.0 46.3 45.7  Platelets 150 - 400 K/uL 393 172 271   CMP Latest Ref Rng & Units 04/20/2020 09/09/2018 08/11/2018  Glucose 70 - 99 mg/dL 08/13/2018) 546(F) 681(E)  BUN 8 - 23 mg/dL 19 21 16   Creatinine 0.61 - 1.24 mg/dL 751(Z) ) 0.01(V  Sodium 135 - 145 mmol/L 138 136 140  Potassium 3.5 - 5.1 mmol/L 4.2 3.9 4.3  Chloride 98 - 111 mmol/L 106 104 108  CO2 22 - 32 mmol/L 24 21(L) 24  Calcium 8.9 - 10.3 mg/dL 9.1 4.94(W) 8.9  Total Protein 6.5 - 8.1 g/dL - 8.2(H) 7.6  Total Bilirubin 0.3 - 1.2 mg/dL - 0.9 0.8  Alkaline Phos 38 - 126 U/L - 64 56  AST 15 - 41 U/L - 24 19  ALT 0 - 44 U/L - 19 16    No results found for: TSH Lab Results  Component Value Date   ALBUMIN 4.2 09/09/2018   ANIONGAP 8 04/20/2020   No results found for: CHOL, HDL, LDLCALC, CHOLHDL No results found for: TRIG No results found for: HGBA1C    ASSESSMENT & PLAN:   Problem List Items Addressed This Visit      Cardiovascular and Mediastinum   Coronary artery disease, non-occlusive   Relevant Medications   metoprolol succinate (TOPROL-XL) 100 MG 24 hr tablet   enalapril (VASOTEC) 20 MG tablet   Essential hypertension - Primary    HTN not at goal, no CP or SOB reported. Taking all meds.   Plan- Continue meds, decrease salt intake, exercise, fu 1 month.       Relevant Medications   metoprolol succinate (TOPROL-XL) 100 MG 24 hr tablet   enalapril (VASOTEC) 20 MG tablet     Other   Hip pain    Patient with left hip pain and thinks that it feels like previous gout  flare.  Plan- Take colchicine high dose and will draw Uric Acid today.  If labs normal will consider Meloxicam.       Relevant Medications   Colchicine (MITIGARE) 0.6 MG CAPS    Other Visit Diagnoses    Acute gout of knee, unspecified cause, unspecified laterality       Relevant Medications   Colchicine (MITIGARE) 0.6 MG CAPS   Other Relevant Orders   Uric acid (Completed)      Meds ordered this encounter  Medications  . metoprolol succinate (TOPROL-XL) 100 MG 24 hr tablet    Sig: Take 1 tablet (100 mg total) by mouth daily. Take with or immediately following a meal.    Dispense:  90 tablet    Refill:  1  . Colchicine (MITIGARE) 0.6 MG CAPS    Sig: Take 0.6 mg by mouth daily.    Dispense:  30 capsule    Refill:  6  . enalapril (VASOTEC) 20 MG tablet    Sig: Take 1 tablet (20 mg total) by mouth daily.    Dispense:  90 tablet    Refill:  1      Follow-up: No follow-ups on file.    Irish Lack, [name] [facility] [address] [place], [place] [address]

## 2020-06-10 NOTE — Assessment & Plan Note (Signed)
Patient with left hip pain and thinks that it feels like previous gout flare.  Plan- Take colchicine high dose and will draw Uric Acid today. If labs normal will consider Meloxicam.

## 2020-06-16 ENCOUNTER — Other Ambulatory Visit: Payer: Self-pay

## 2020-06-16 ENCOUNTER — Ambulatory Visit (INDEPENDENT_AMBULATORY_CARE_PROVIDER_SITE_OTHER): Payer: Medicare HMO | Admitting: Internal Medicine

## 2020-06-16 ENCOUNTER — Encounter: Payer: Self-pay | Admitting: Internal Medicine

## 2020-06-16 VITALS — BP 138/82 | HR 84 | Ht 69.0 in | Wt 201.4 lb

## 2020-06-16 DIAGNOSIS — M1A3790 Chronic gout due to renal impairment, unspecified ankle and foot, without tophus (tophi): Secondary | ICD-10-CM

## 2020-06-16 DIAGNOSIS — M25559 Pain in unspecified hip: Secondary | ICD-10-CM

## 2020-06-16 DIAGNOSIS — M19072 Primary osteoarthritis, left ankle and foot: Secondary | ICD-10-CM | POA: Diagnosis not present

## 2020-06-16 DIAGNOSIS — I10 Essential (primary) hypertension: Secondary | ICD-10-CM | POA: Diagnosis not present

## 2020-06-16 DIAGNOSIS — M19071 Primary osteoarthritis, right ankle and foot: Secondary | ICD-10-CM | POA: Diagnosis not present

## 2020-06-16 DIAGNOSIS — K4090 Unilateral inguinal hernia, without obstruction or gangrene, not specified as recurrent: Secondary | ICD-10-CM

## 2020-06-16 MED ORDER — SPIRONOLACTONE 25 MG PO TABS: 25.0000 mg | ORAL_TABLET | Freq: Every day | ORAL | 0 refills | Status: DC

## 2020-06-16 MED ORDER — METHYLPREDNISOLONE 4 MG PO TBPK: ORAL_TABLET | ORAL | 0 refills | Status: AC

## 2020-06-16 NOTE — Assessment & Plan Note (Signed)
Right middle finger is swollen, he also complains of pain in the both ankles.  Patient was given Kenalog 40 mg intramuscular.

## 2020-06-16 NOTE — Progress Notes (Signed)
Established Patient Office Visit  Subjective:  Patient ID: Benjamin Valentine, male    DOB: 1945-04-28  Age: 75 y.o. MRN: [id number]  CC:  Chief Complaint  Patient presents with  . Joint Pain    Patient states that he is having allover joint pain and swelling     HPI  [name] presents for arthiritis of lt knee, pain and swelling of fingers , pt get chills.c/o swelling  ofrt middle finger, no h/o  Diabetes , or prostate trouble  Past Medical History:  Diagnosis Date  . Arthritis   . Coronary artery disease   . Gout   . Hypertension     Past Surgical History:  Procedure Laterality Date  . CARDIAC SURGERY     CABG 2002  . LAPAROSCOPIC APPENDECTOMY N/A 08/11/2018   Procedure: APPENDECTOMY LAPAROSCOPIC;  Surgeon: Henrene Dodge, MD;  Location: [facility] ORS;  Service: General;  Laterality: N/A;    History reviewed. No pertinent family history.  Social History   Socioeconomic History  . Marital status: Widowed    Spouse name: Not on file  . Number of children: Not on file  . Years of education: Not on file  . Highest education level: Not on file  Occupational History  . Not on file  Tobacco Use  . Smoking status: Former Games developer  . Smokeless tobacco: Never Used  Substance and Sexual Activity  . Alcohol use: No  . Drug use: No  . Sexual activity: Not on file  Other Topics Concern  . Not on file  Social History Narrative  . Not on file   Social Determinants of Health   Financial Resource Strain: Not on file  Food Insecurity: Not on file  Transportation Needs: Not on file  Physical Activity: Not on file  Stress: Not on file  Social Connections: Not on file  Intimate Partner Violence: Not on file     Current Outpatient Medications:  .  albuterol (VENTOLIN HFA) 108 (90 Base) MCG/ACT inhaler, Inhale 2 puffs into the lungs every 6 (six) hours as needed for wheezing or shortness of breath., Disp: 1 Inhaler, Rfl: 2 .  allopurinol (ZYLOPRIM) 300 MG tablet, Take 1  tablet (300 mg total) by mouth daily., Disp: 30 tablet, Rfl: 6 .  aspirin EC 81 MG tablet, Take 81 mg by mouth daily., Disp: , Rfl:  .  Colchicine (MITIGARE) 0.6 MG CAPS, Take 0.6 mg by mouth daily., Disp: 30 capsule, Rfl: 6 .  enalapril (VASOTEC) 20 MG tablet, Take 1 tablet (20 mg total) by mouth daily., Disp: 90 tablet, Rfl: 1 .  furosemide (LASIX) 20 MG tablet, Take 1 tablet (20 mg total) by mouth 2 (two) times daily., Disp: 60 tablet, Rfl: 0 .  HYDROcodone-acetaminophen (NORCO) 5-325 MG tablet, Take 1 tablet by mouth 3 (three) times daily as needed., Disp: 10 tablet, Rfl: 0 .  ibuprofen (ADVIL) 800 MG tablet, Take 1 tablet (800 mg total) by mouth every 8 (eight) hours as needed., Disp: 30 tablet, Rfl: 0 .  meloxicam (MOBIC) 15 MG tablet, Take 1 tablet (15 mg total) by mouth daily., Disp: 30 tablet, Rfl: 3 .  methylPREDNISolone (MEDROL DOSEPAK) 4 MG TBPK tablet, Take 1 tablet (4 mg total) by mouth as directed for 5 days, THEN 1 tablet (4 mg total) as directed for 5 days. Use as directed., Disp: 21 tablet, Rfl: 0 .  metoprolol succinate (TOPROL-XL) 100 MG 24 hr tablet, Take 1 tablet (100 mg total) by mouth daily. Take  with or immediately following a meal., Disp: 90 tablet, Rfl: 1 .  spironolactone (ALDACTONE) 25 MG tablet, Take 1 tablet (25 mg total) by mouth daily., Disp: 30 tablet, Rfl: 0   No Known Allergies  ROS Review of Systems  Constitutional: Negative.  Negative for appetite change and fatigue.  HENT: Negative.  Negative for congestion and hearing loss.   Eyes: Negative.   Respiratory: Negative.  Negative for cough, choking, chest tightness, shortness of breath and wheezing.   Cardiovascular: Negative.  Negative for chest pain.  Gastrointestinal: Negative.  Negative for blood in stool.  Endocrine: Negative.  Negative for polydipsia.  Genitourinary: Negative.  Negative for hematuria.  Musculoskeletal: Positive for arthralgias, gait problem and joint swelling. Negative for neck pain.        Swelling of both legs  Skin: Negative.   Allergic/Immunologic: Negative.   Hematological: Negative.   Psychiatric/Behavioral: Negative.   All other systems reviewed and are negative.     Objective:    Physical Exam Vitals reviewed.  Constitutional:      Appearance: Normal appearance.  HENT:     Mouth/Throat:     Mouth: Mucous membranes are moist.  Eyes:     Pupils: Pupils are equal, round, and reactive to light.  Neck:     Vascular: No carotid bruit.  Cardiovascular:     Rate and Rhythm: Normal rate and regular rhythm.     Pulses: Normal pulses.     Heart sounds: Normal heart sounds.  Pulmonary:     Effort: Pulmonary effort is normal.     Breath sounds: Normal breath sounds.  Chest:     Chest wall: No deformity or tenderness.  Breasts:     Right: No supraclavicular adenopathy.     Left: No supraclavicular adenopathy.    Abdominal:     General: Bowel sounds are normal.     Palpations: Abdomen is soft. There is no hepatomegaly, splenomegaly or mass.     Tenderness: There is no abdominal tenderness.     Hernia: No hernia is present.  Musculoskeletal:     Right hand: Swelling, tenderness and bony tenderness present.       Arms:     Cervical back: Neck supple.     Right lower leg: Edema present.     Left lower leg: Edema present.     Comments: Swelling of rt middle finger  Lymphadenopathy:     Upper Body:     Right upper body: No supraclavicular adenopathy.     Left upper body: No supraclavicular adenopathy.  Skin:    Findings: No rash.  Neurological:     Mental Status: He is alert and oriented to person, place, and time.     Motor: No weakness.  Psychiatric:        Mood and Affect: Mood normal.        Behavior: Behavior normal.     BP 138/82   Pulse 84   Ht 5\' 9"  (1.753 m)   Wt 201 lb 6.4 oz (91.4 kg)   BMI 29.74 kg/m  Wt Readings from Last 3 Encounters:  06/16/20 201 lb 6.4 oz (91.4 kg)  05/21/20 201 lb 6.4 oz (91.4 kg)  04/20/20 234 lb  (106.1 kg)     Health Maintenance Due  Topic Date Due  . Hepatitis C Screening  Never done  . COVID-19 Vaccine (1) Never done  . TETANUS/TDAP  Never done  . PNA vac Low Risk Adult (1 of 2 - PCV13)  Never done    There are no preventive care reminders to display for this patient.  No results found for: TSH Lab Results  Component Value Date   WBC 9.7 04/20/2020   HGB 14.1 04/20/2020   HCT 44.0 04/20/2020   MCV 88.0 04/20/2020   PLT 393 04/20/2020   Lab Results  Component Value Date   NA 138 04/20/2020   K 4.2 04/20/2020   CO2 24 04/20/2020   GLUCOSE 108 (H) 04/20/2020   BUN 19 04/20/2020   CREATININE 1.47 (H) 04/20/2020   BILITOT 0.9 09/09/2018   ALKPHOS 64 09/09/2018   AST 24 09/09/2018   ALT 19 09/09/2018   PROT 8.2 (H) 09/09/2018   ALBUMIN 4.2 09/09/2018   CALCIUM 9.1 04/20/2020   ANIONGAP 8 04/20/2020   No results found for: CHOL No results found for: HDL No results found for: LDLCALC No results found for: TRIG No results found for: CHOLHDL No results found for: ZYSA6T    Assessment & Plan:   Problem List Items Addressed This Visit      Cardiovascular and Mediastinum   Essential hypertension - Primary    Patient blood pressure is normal patient denies any chest pain or shortness of breath there is no history of palpitation or paroxysmal nocturnal dyspnea   patient was advised to follow low-salt low-cholesterol diet    ideally I want to keep systolic blood pressure below 016 mmHg, patient was asked to check blood pressure one times a week and give me a report on that.  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   Patient was given Kenalog 40 mg intramuscular.      Relevant Medications   methylPREDNISolone (MEDROL DOSEPAK) 4 MG TBPK tablet   Other Relevant Orders   ANA   CBC with Differential/Platelet   COMPLETE METABOLIC PANEL WITH GFR   Primary osteoarthritis of both ankles    Started normal prednisone.  We will check sedimentation rate and fana, I will start him on prednisone.      Relevant Medications   methylPREDNISolone (MEDROL DOSEPAK) 4 MG TBPK tablet     Other   Left groin hernia    Chronic problem,it is not obstructed  referral to surgeon.      Hip pain    Started on prednisone will also get sedimentation rate, I will see the patient next week.         Meds ordered this encounter  Medications  . spironolactone (ALDACTONE) 25 MG tablet    Sig: Take 1 tablet (25 mg total) by mouth daily.    Dispense:  30 tablet    Refill:  0  . methylPREDNISolone (MEDROL DOSEPAK) 4 MG TBPK tablet    Sig: Take 1 tablet (4 mg total) by mouth as directed for 5 days, THEN 1 tablet (4 mg total) as directed for 5 days. Use as directed.    Dispense:  21 tablet    Refill:  0    Follow-up: No follow-ups on file.    Corky Downs, MD

## 2020-06-16 NOTE — Assessment & Plan Note (Signed)
Started on prednisone will also get sedimentation rate, I will see the patient next week.

## 2020-06-16 NOTE — Assessment & Plan Note (Signed)
Started normal prednisone.  We will check sedimentation rate and fana, I will start him on prednisone.

## 2020-06-16 NOTE — Assessment & Plan Note (Signed)
Chronic problem,it is not obstructed  referral to surgeon.

## 2020-06-16 NOTE — Assessment & Plan Note (Signed)
Patient blood pressure is normal patient denies any chest pain or shortness of breath there is no history of palpitation or paroxysmal nocturnal dyspnea   patient was advised to follow low-salt low-cholesterol diet    ideally I want to keep systolic blood pressure below 130 mmHg, patient was asked to check blood pressure one times a week and give me a report on that.  Patient will be follow-up in 3 months  or earlier as needed, patient will call me back for any change in the cardiovascular symptoms    

## 2020-06-18 LAB — CBC WITH DIFFERENTIAL/PLATELET
Absolute Monocytes: 1065 cells/uL — ABNORMAL HIGH (ref 200–950)
Basophils Absolute: 18 cells/uL (ref 0–200)
Basophils Relative: 0.2 %
Eosinophils Absolute: 91 cells/uL (ref 15–500)
Eosinophils Relative: 1 %
HCT: 38.6 % (ref 38.5–50.0)
Hemoglobin: 12.2 g/dL — ABNORMAL LOW (ref 13.2–17.1)
Lymphs Abs: 1529 cells/uL (ref 850–3900)
MCH: 27.4 pg (ref 27.0–33.0)
MCHC: 31.6 g/dL — ABNORMAL LOW (ref 32.0–36.0)
MCV: 86.5 fL (ref 80.0–100.0)
MPV: 11.2 fL (ref 7.5–12.5)
Monocytes Relative: 11.7 %
Neutro Abs: 6397 cells/uL (ref 1500–7800)
Neutrophils Relative %: 70.3 %
Platelets: 327 10*3/uL (ref 140–400)
RBC: 4.46 10*6/uL (ref 4.20–5.80)
RDW: 15 % (ref 11.0–15.0)
Total Lymphocyte: 16.8 %
WBC: 9.1 10*3/uL (ref 3.8–10.8)

## 2020-06-18 LAB — COMPLETE METABOLIC PANEL WITH GFR
AG Ratio: 1.1 (calc) (ref 1.0–2.5)
ALT: 12 U/L (ref 9–46)
AST: 23 U/L (ref 10–35)
Albumin: 4 g/dL (ref 3.6–5.1)
Alkaline phosphatase (APISO): 79 U/L (ref 35–144)
BUN: 20 mg/dL (ref 7–25)
CO2: 16 mmol/L — ABNORMAL LOW (ref 20–32)
Calcium: 9.3 mg/dL (ref 8.6–10.3)
Chloride: 108 mmol/L (ref 98–110)
Creat: 1.18 mg/dL (ref 0.70–1.18)
GFR, Est African American: 69 mL/min/{1.73_m2} (ref >=60)
GFR, Est Non African American: 60 mL/min/{1.73_m2} (ref >=60)
Globulin: 3.6 g/dL (calc) (ref 1.9–3.7)
Glucose, Bld: 109 mg/dL — ABNORMAL HIGH (ref 65–99)
Potassium: 4.4 mmol/L (ref 3.5–5.3)
Sodium: 141 mmol/L (ref 135–146)
Total Bilirubin: 0.6 mg/dL (ref 0.2–1.2)
Total Protein: 7.6 g/dL (ref 6.1–8.1)

## 2020-06-18 LAB — ANA: Anti Nuclear Antibody (ANA): NEGATIVE

## 2020-06-23 ENCOUNTER — Ambulatory Visit (INDEPENDENT_AMBULATORY_CARE_PROVIDER_SITE_OTHER): Payer: Medicare HMO | Admitting: Internal Medicine

## 2020-06-23 ENCOUNTER — Encounter: Payer: Self-pay | Admitting: Internal Medicine

## 2020-06-23 ENCOUNTER — Other Ambulatory Visit: Payer: Self-pay

## 2020-06-23 VITALS — BP 140/82 | HR 86 | Ht 69.0 in | Wt 201.5 lb

## 2020-06-23 DIAGNOSIS — I10 Essential (primary) hypertension: Secondary | ICD-10-CM | POA: Diagnosis not present

## 2020-06-23 DIAGNOSIS — M25559 Pain in unspecified hip: Secondary | ICD-10-CM | POA: Diagnosis not present

## 2020-06-23 DIAGNOSIS — I2510 Atherosclerotic heart disease of native coronary artery without angina pectoris: Secondary | ICD-10-CM

## 2020-06-23 DIAGNOSIS — M1A3790 Chronic gout due to renal impairment, unspecified ankle and foot, without tophus (tophi): Secondary | ICD-10-CM

## 2020-06-23 NOTE — Assessment & Plan Note (Signed)
Patient is advised to continue taking his present medication

## 2020-06-23 NOTE — Assessment & Plan Note (Signed)
Stable at the present time. 

## 2020-06-23 NOTE — Progress Notes (Signed)
Established Patient Office Visit  Subjective:  Patient ID: Benjamin Valentine, male    DOB: February 15, 1945  Age: 75 y.o. MRN: [id number]  CC:  Chief Complaint  Patient presents with  . lab results    HPI  Benjamin Valentine presents for gouty arthiritis, denies chest pain blood pressure is stable he is not overweight he is keeping his weight stable, no smoke does not drink.  He has a history of coronary bypass surgery in 2002  Past Medical History:  Diagnosis Date  . Arthritis   . Coronary artery disease   . Gout   . Hypertension     Past Surgical History:  Procedure Laterality Date  . CARDIAC SURGERY     CABG 2002  . LAPAROSCOPIC APPENDECTOMY N/A 08/11/2018   Procedure: APPENDECTOMY LAPAROSCOPIC;  Surgeon: Henrene Dodge, MD;  Location: [facility] ORS;  Service: General;  Laterality: N/A;    History reviewed. No pertinent family history.  Social History   Socioeconomic History  . Marital status: Widowed    Spouse name: Not on file  . Number of children: Not on file  . Years of education: Not on file  . Highest education level: Not on file  Occupational History  . Not on file  Tobacco Use  . Smoking status: Former Games developer  . Smokeless tobacco: Never Used  Substance and Sexual Activity  . Alcohol use: No  . Drug use: No  . Sexual activity: Not on file  Other Topics Concern  . Not on file  Social History Narrative  . Not on file   Social Determinants of Health   Financial Resource Strain: Not on file  Food Insecurity: Not on file  Transportation Needs: Not on file  Physical Activity: Not on file  Stress: Not on file  Social Connections: Not on file  Intimate Partner Violence: Not on file     Current Outpatient Medications:  .  albuterol (VENTOLIN HFA) 108 (90 Base) MCG/ACT inhaler, Inhale 2 puffs into the lungs every 6 (six) hours as needed for wheezing or shortness of breath., Disp: 1 Inhaler, Rfl: 2 .  allopurinol (ZYLOPRIM) 300 MG tablet, Take 1 tablet (300 mg total)  by mouth daily., Disp: 30 tablet, Rfl: 6 .  aspirin EC 81 MG tablet, Take 81 mg by mouth daily., Disp: , Rfl:  .  Colchicine (MITIGARE) 0.6 MG CAPS, Take 0.6 mg by mouth daily., Disp: 30 capsule, Rfl: 6 .  enalapril (VASOTEC) 20 MG tablet, Take 1 tablet (20 mg total) by mouth daily., Disp: 90 tablet, Rfl: 1 .  furosemide (LASIX) 20 MG tablet, Take 1 tablet (20 mg total) by mouth 2 (two) times daily., Disp: 60 tablet, Rfl: 0 .  HYDROcodone-acetaminophen (NORCO) 5-325 MG tablet, Take 1 tablet by mouth 3 (three) times daily as needed., Disp: 10 tablet, Rfl: 0 .  ibuprofen (ADVIL) 800 MG tablet, Take 1 tablet (800 mg total) by mouth every 8 (eight) hours as needed., Disp: 30 tablet, Rfl: 0 .  meloxicam (MOBIC) 15 MG tablet, Take 1 tablet (15 mg total) by mouth daily., Disp: 30 tablet, Rfl: 3 .  methylPREDNISolone (MEDROL DOSEPAK) 4 MG TBPK tablet, Take 1 tablet (4 mg total) by mouth as directed for 5 days, THEN 1 tablet (4 mg total) as directed for 5 days. Use as directed., Disp: 21 tablet, Rfl: 0 .  metoprolol succinate (TOPROL-XL) 100 MG 24 hr tablet, Take 1 tablet (100 mg total) by mouth daily. Take with or immediately following a meal.,  Disp: 90 tablet, Rfl: 1 .  spironolactone (ALDACTONE) 25 MG tablet, Take 1 tablet (25 mg total) by mouth daily., Disp: 30 tablet, Rfl: 0   No Known Allergies  ROS Review of Systems  Constitutional: Negative.   HENT: Negative.   Eyes: Negative.   Respiratory: Negative.   Cardiovascular: Negative.   Gastrointestinal: Negative.   Endocrine: Negative.   Genitourinary: Negative.   Musculoskeletal: Negative.   Skin: Negative.   Allergic/Immunologic: Negative.   Neurological: Negative.   Hematological: Negative.   Psychiatric/Behavioral: Negative.   All other systems reviewed and are negative.     Objective:    Physical Exam Vitals reviewed.  Constitutional:      Appearance: Normal appearance.  HENT:     Mouth/Throat:     Mouth: Mucous membranes are  moist.  Eyes:     Pupils: Pupils are equal, round, and reactive to light.  Neck:     Vascular: No carotid bruit.  Cardiovascular:     Rate and Rhythm: Normal rate and regular rhythm.     Pulses: Normal pulses.     Heart sounds: Normal heart sounds.  Pulmonary:     Effort: Pulmonary effort is normal.     Breath sounds: Normal breath sounds.  Abdominal:     General: Bowel sounds are normal.     Palpations: Abdomen is soft. There is no hepatomegaly, splenomegaly or mass.     Tenderness: There is no abdominal tenderness.     Hernia: No hernia is present.  Musculoskeletal:     Cervical back: Neck supple.     Right lower leg: No edema.     Left lower leg: No edema.  Skin:    Findings: No rash.  Neurological:     Mental Status: He is alert and oriented to person, place, and time.     Motor: No weakness.  Psychiatric:        Mood and Affect: Mood normal.        Behavior: Behavior normal.     BP 140/82   Pulse 86   Ht 5\' 9"  (1.753 m)   Wt 201 lb 8 oz (91.4 kg)   BMI 29.76 kg/m  Wt Readings from Last 3 Encounters:  06/23/20 201 lb 8 oz (91.4 kg)  06/16/20 201 lb 6.4 oz (91.4 kg)  05/21/20 201 lb 6.4 oz (91.4 kg)     Health Maintenance Due  Topic Date Due  . COVID-19 Vaccine (1) Never done  . Hepatitis C Screening  Never done  . TETANUS/TDAP  Never done  . PNA vac Low Risk Adult (1 of 2 - PCV13) Never done    There are no preventive care reminders to display for this patient.  No results found for: TSH Lab Results  Component Value Date   WBC 9.1 06/16/2020   HGB 12.2 (L) 06/16/2020   HCT 38.6 06/16/2020   MCV 86.5 06/16/2020   PLT 327 06/16/2020   Lab Results  Component Value Date   NA 141 06/16/2020   K 4.4 06/16/2020   CO2 16 (L) 06/16/2020   GLUCOSE 109 (H) 06/16/2020   BUN 20 06/16/2020   CREATININE 1.18 06/16/2020   BILITOT 0.6 06/16/2020   ALKPHOS 64 09/09/2018   AST 23 06/16/2020   ALT 12 06/16/2020   PROT 7.6 06/16/2020   ALBUMIN 4.2  09/09/2018   CALCIUM 9.3 06/16/2020   ANIONGAP 8 04/20/2020   No results found for: CHOL No results found for: HDL No results found  for: LDLCALC No results found for: TRIG No results found for: CHOLHDL No results found for: ZOXW9U    Assessment & Plan:   Problem List Items Addressed This Visit      Cardiovascular and Mediastinum   Coronary artery disease, non-occlusive - Primary    Stable at the present time      Essential hypertension    Patient blood pressure is normal patient denies any chest pain or shortness of breath there is no history of palpitation or paroxysmal nocturnal dyspnea   patient was advised to follow low-salt low-cholesterol diet    ideally I want to keep systolic blood pressure below 045 mmHg, patient was asked to check blood pressure one times a week and give me a report on that.  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  He was told that he will need a cortisone shot in the left knee in the future         No orders of the defined types were placed in this encounter.   Follow-up: No follow-ups on file.    Corky Downs, MD

## 2020-06-23 NOTE — Assessment & Plan Note (Signed)
Left knee pain and neck pain with bilateral.  He was told that he will need a cortisone shot in the left knee in the future

## 2020-06-23 NOTE — Assessment & Plan Note (Signed)
Patient blood pressure is normal patient denies any chest pain or shortness of breath there is no history of palpitation or paroxysmal nocturnal dyspnea   patient was advised to follow low-salt low-cholesterol diet    ideally I want to keep systolic blood pressure below 130 mmHg, patient was asked to check blood pressure one times a week and give me a report on that.  Patient will be follow-up in 3 months  or earlier as needed, patient will call me back for any change in the cardiovascular symptoms    

## 2020-07-09 ENCOUNTER — Other Ambulatory Visit: Payer: Self-pay | Admitting: Internal Medicine

## 2020-07-19 DIAGNOSIS — M1712 Unilateral primary osteoarthritis, left knee: Secondary | ICD-10-CM | POA: Diagnosis not present

## 2020-07-19 DIAGNOSIS — M79673 Pain in unspecified foot: Secondary | ICD-10-CM | POA: Diagnosis not present

## 2020-07-22 ENCOUNTER — Ambulatory Visit: Payer: Medicare HMO | Admitting: Podiatry

## 2020-08-10 ENCOUNTER — Other Ambulatory Visit: Payer: Self-pay | Admitting: Internal Medicine

## 2020-09-10 ENCOUNTER — Other Ambulatory Visit: Payer: Self-pay | Admitting: Internal Medicine

## 2020-09-24 ENCOUNTER — Other Ambulatory Visit: Payer: Self-pay | Admitting: Internal Medicine

## 2020-10-10 ENCOUNTER — Other Ambulatory Visit: Payer: Self-pay | Admitting: Internal Medicine

## 2020-10-10 DIAGNOSIS — M1A3790 Chronic gout due to renal impairment, unspecified ankle and foot, without tophus (tophi): Secondary | ICD-10-CM

## 2020-11-11 ENCOUNTER — Inpatient Hospital Stay
Admission: EM | Admit: 2020-11-11 | Discharge: 2020-11-17 | DRG: 286 | Disposition: A | Discharge status: Home / Self Care | Payer: Medicare HMO | Attending: Internal Medicine | Admitting: Internal Medicine

## 2020-11-11 ENCOUNTER — Encounter: Payer: Self-pay | Admitting: Emergency Medicine

## 2020-11-11 ENCOUNTER — Emergency Department: Payer: Medicare HMO

## 2020-11-11 ENCOUNTER — Observation Stay: Payer: Medicare HMO

## 2020-11-11 ENCOUNTER — Observation Stay
Admit: 2020-11-11 | Discharge: 2020-11-11 | Disposition: A | Discharge status: Home / Self Care | Payer: Medicare HMO | Attending: Internal Medicine | Admitting: Internal Medicine

## 2020-11-11 ENCOUNTER — Other Ambulatory Visit: Payer: Self-pay

## 2020-11-11 DIAGNOSIS — R0600 Dyspnea, unspecified: Secondary | ICD-10-CM | POA: Diagnosis present

## 2020-11-11 DIAGNOSIS — R778 Other specified abnormalities of plasma proteins: Secondary | ICD-10-CM | POA: Diagnosis not present

## 2020-11-11 DIAGNOSIS — E669 Obesity, unspecified: Secondary | ICD-10-CM | POA: Diagnosis present

## 2020-11-11 DIAGNOSIS — I5021 Acute systolic (congestive) heart failure: Secondary | ICD-10-CM | POA: Diagnosis not present

## 2020-11-11 DIAGNOSIS — Z28310 Unvaccinated for covid-19: Secondary | ICD-10-CM | POA: Diagnosis not present

## 2020-11-11 DIAGNOSIS — I255 Ischemic cardiomyopathy: Secondary | ICD-10-CM | POA: Diagnosis present

## 2020-11-11 DIAGNOSIS — Z8249 Family history of ischemic heart disease and other diseases of the circulatory system: Secondary | ICD-10-CM | POA: Diagnosis not present

## 2020-11-11 DIAGNOSIS — I25708 Atherosclerosis of coronary artery bypass graft(s), unspecified, with other forms of angina pectoris: Secondary | ICD-10-CM | POA: Diagnosis not present

## 2020-11-11 DIAGNOSIS — J209 Acute bronchitis, unspecified: Secondary | ICD-10-CM | POA: Diagnosis present

## 2020-11-11 DIAGNOSIS — M1990 Unspecified osteoarthritis, unspecified site: Secondary | ICD-10-CM | POA: Diagnosis present

## 2020-11-11 DIAGNOSIS — M1A3790 Chronic gout due to renal impairment, unspecified ankle and foot, without tophus (tophi): Secondary | ICD-10-CM | POA: Diagnosis present

## 2020-11-11 DIAGNOSIS — I272 Pulmonary hypertension, unspecified: Secondary | ICD-10-CM | POA: Diagnosis present

## 2020-11-11 DIAGNOSIS — R0602 Shortness of breath: Secondary | ICD-10-CM

## 2020-11-11 DIAGNOSIS — Z791 Long term (current) use of non-steroidal anti-inflammatories (NSAID): Secondary | ICD-10-CM

## 2020-11-11 DIAGNOSIS — R079 Chest pain, unspecified: Secondary | ICD-10-CM

## 2020-11-11 DIAGNOSIS — K4090 Unilateral inguinal hernia, without obstruction or gangrene, not specified as recurrent: Secondary | ICD-10-CM | POA: Diagnosis not present

## 2020-11-11 DIAGNOSIS — Z8616 Personal history of COVID-19: Secondary | ICD-10-CM

## 2020-11-11 DIAGNOSIS — I509 Heart failure, unspecified: Secondary | ICD-10-CM | POA: Insufficient documentation

## 2020-11-11 DIAGNOSIS — R140 Abdominal distension (gaseous): Secondary | ICD-10-CM

## 2020-11-11 DIAGNOSIS — I5023 Acute on chronic systolic (congestive) heart failure: Secondary | ICD-10-CM | POA: Diagnosis present

## 2020-11-11 DIAGNOSIS — Z9114 Patient's other noncompliance with medication regimen: Secondary | ICD-10-CM | POA: Diagnosis not present

## 2020-11-11 DIAGNOSIS — N1831 Chronic kidney disease, stage 3a: Secondary | ICD-10-CM

## 2020-11-11 DIAGNOSIS — Z87891 Personal history of nicotine dependence: Secondary | ICD-10-CM | POA: Diagnosis not present

## 2020-11-11 DIAGNOSIS — I25118 Atherosclerotic heart disease of native coronary artery with other forms of angina pectoris: Secondary | ICD-10-CM

## 2020-11-11 DIAGNOSIS — Z6829 Body mass index (BMI) 29.0-29.9, adult: Secondary | ICD-10-CM

## 2020-11-11 DIAGNOSIS — I160 Hypertensive urgency: Secondary | ICD-10-CM

## 2020-11-11 DIAGNOSIS — Z20822 Contact with and (suspected) exposure to covid-19: Secondary | ICD-10-CM | POA: Diagnosis present

## 2020-11-11 DIAGNOSIS — R0789 Other chest pain: Secondary | ICD-10-CM | POA: Diagnosis not present

## 2020-11-11 DIAGNOSIS — I517 Cardiomegaly: Secondary | ICD-10-CM | POA: Diagnosis not present

## 2020-11-11 DIAGNOSIS — N179 Acute kidney failure, unspecified: Secondary | ICD-10-CM | POA: Diagnosis not present

## 2020-11-11 DIAGNOSIS — I130 Hypertensive heart and chronic kidney disease with heart failure and stage 1 through stage 4 chronic kidney disease, or unspecified chronic kidney disease: Secondary | ICD-10-CM | POA: Diagnosis not present

## 2020-11-11 DIAGNOSIS — I110 Hypertensive heart disease with heart failure: Secondary | ICD-10-CM | POA: Diagnosis not present

## 2020-11-11 DIAGNOSIS — I10 Essential (primary) hypertension: Secondary | ICD-10-CM | POA: Diagnosis not present

## 2020-11-11 DIAGNOSIS — I2510 Atherosclerotic heart disease of native coronary artery without angina pectoris: Secondary | ICD-10-CM | POA: Diagnosis not present

## 2020-11-11 DIAGNOSIS — Z951 Presence of aortocoronary bypass graft: Secondary | ICD-10-CM | POA: Diagnosis not present

## 2020-11-11 DIAGNOSIS — E876 Hypokalemia: Secondary | ICD-10-CM | POA: Diagnosis present

## 2020-11-11 DIAGNOSIS — I25810 Atherosclerosis of coronary artery bypass graft(s) without angina pectoris: Secondary | ICD-10-CM | POA: Diagnosis present

## 2020-11-11 DIAGNOSIS — I420 Dilated cardiomyopathy: Secondary | ICD-10-CM

## 2020-11-11 DIAGNOSIS — Z79899 Other long term (current) drug therapy: Secondary | ICD-10-CM

## 2020-11-11 DIAGNOSIS — R059 Cough, unspecified: Secondary | ICD-10-CM | POA: Diagnosis not present

## 2020-11-11 DIAGNOSIS — J9601 Acute respiratory failure with hypoxia: Secondary | ICD-10-CM | POA: Diagnosis not present

## 2020-11-11 DIAGNOSIS — R109 Unspecified abdominal pain: Secondary | ICD-10-CM | POA: Diagnosis not present

## 2020-11-11 DIAGNOSIS — E785 Hyperlipidemia, unspecified: Secondary | ICD-10-CM | POA: Diagnosis present

## 2020-11-11 DIAGNOSIS — M109 Gout, unspecified: Secondary | ICD-10-CM

## 2020-11-11 DIAGNOSIS — Z7982 Long term (current) use of aspirin: Secondary | ICD-10-CM

## 2020-11-11 DIAGNOSIS — R001 Bradycardia, unspecified: Secondary | ICD-10-CM | POA: Diagnosis present

## 2020-11-11 LAB — COMPREHENSIVE METABOLIC PANEL
ALT: 19 U/L (ref 0–44)
AST: 22 U/L (ref 15–41)
Albumin: 4.1 g/dL (ref 3.5–5.0)
Alkaline Phosphatase: 57 U/L (ref 38–126)
Anion gap: 9 (ref 5–15)
BUN: 21 mg/dL (ref 8–23)
CO2: 24 mmol/L (ref 22–32)
Calcium: 8.8 mg/dL — ABNORMAL LOW (ref 8.9–10.3)
Chloride: 103 mmol/L (ref 98–111)
Creatinine, Ser: 1.48 mg/dL — ABNORMAL HIGH (ref 0.61–1.24)
GFR, Estimated: 49 mL/min — ABNORMAL LOW (ref >60)
Glucose, Bld: 123 mg/dL — ABNORMAL HIGH (ref 70–99)
Potassium: 3.9 mmol/L (ref 3.5–5.1)
Sodium: 136 mmol/L (ref 135–145)
Total Bilirubin: 0.9 mg/dL (ref 0.3–1.2)
Total Protein: 7.8 g/dL (ref 6.5–8.1)

## 2020-11-11 LAB — CBC WITH DIFFERENTIAL/PLATELET
Abs Immature Granulocytes: 0.03 10*3/uL (ref 0.00–0.07)
Basophils Absolute: 0 10*3/uL (ref 0.0–0.1)
Basophils Relative: 1 %
Eosinophils Absolute: 0.3 10*3/uL (ref 0.0–0.5)
Eosinophils Relative: 4 %
HCT: 40.7 % (ref 39.0–52.0)
Hemoglobin: 13.3 g/dL (ref 13.0–17.0)
Immature Granulocytes: 0 %
Lymphocytes Relative: 31 %
Lymphs Abs: 2.5 10*3/uL (ref 0.7–4.0)
MCH: 30.1 pg (ref 26.0–34.0)
MCHC: 32.7 g/dL (ref 30.0–36.0)
MCV: 92.1 fL (ref 80.0–100.0)
Monocytes Absolute: 0.9 10*3/uL (ref 0.1–1.0)
Monocytes Relative: 12 %
Neutro Abs: 4.3 10*3/uL (ref 1.7–7.7)
Neutrophils Relative %: 52 %
Platelets: 344 10*3/uL (ref 150–400)
RBC: 4.42 MIL/uL (ref 4.22–5.81)
RDW: 15.5 % (ref 11.5–15.5)
WBC: 8.1 10*3/uL (ref 4.0–10.5)
nRBC: 0 % (ref 0.0–0.2)

## 2020-11-11 LAB — RESP PANEL BY RT-PCR (FLU A&B, COVID) ARPGX2
Influenza A by PCR: NEGATIVE
Influenza B by PCR: NEGATIVE
SARS Coronavirus 2 by RT PCR: NEGATIVE

## 2020-11-11 LAB — RESPIRATORY PANEL BY PCR

## 2020-11-11 LAB — URINALYSIS, ROUTINE W REFLEX MICROSCOPIC
Bilirubin Urine: NEGATIVE
Glucose, UA: NEGATIVE mg/dL
Hgb urine dipstick: NEGATIVE
Ketones, ur: NEGATIVE mg/dL
Leukocytes,Ua: NEGATIVE
Nitrite: NEGATIVE
Protein, ur: 30 mg/dL — AB
Specific Gravity, Urine: 1.01 (ref 1.005–1.030)
pH: 6 (ref 5.0–8.0)

## 2020-11-11 LAB — TROPONIN I (HIGH SENSITIVITY)
Troponin I (High Sensitivity): 59 ng/L — ABNORMAL HIGH (ref <18)
Troponin I (High Sensitivity): 52 ng/L — ABNORMAL HIGH (ref <18)

## 2020-11-11 LAB — URINALYSIS, MICROSCOPIC (REFLEX): Bacteria, UA: NONE SEEN

## 2020-11-11 LAB — BRAIN NATRIURETIC PEPTIDE: B Natriuretic Peptide: 616.5 pg/mL — ABNORMAL HIGH (ref 0.0–100.0)

## 2020-11-11 LAB — ECHOCARDIOGRAM COMPLETE
Height: 69 in
S' Lateral: 5 cm
Weight: 3520 oz

## 2020-11-11 LAB — LIPASE, BLOOD: Lipase: 43 U/L (ref 11–51)

## 2020-11-11 MED ORDER — FUROSEMIDE 10 MG/ML IJ SOLN
40.0000 mg | Freq: Two times a day (BID) | INTRAMUSCULAR | Status: DC
  Administered 2020-11-11 – 2020-11-12 (×2): 40 mg via INTRAVENOUS
  Filled 2020-11-11 (×2): qty 4

## 2020-11-11 MED ORDER — ENOXAPARIN SODIUM 40 MG/0.4ML IJ SOSY: 40.0000 mg | PREFILLED_SYRINGE | INTRAMUSCULAR | Status: DC

## 2020-11-11 MED ORDER — HYDRALAZINE HCL 50 MG PO TABS: 25.0000 mg | ORAL_TABLET | Freq: Four times a day (QID) | ORAL | Status: DC | PRN

## 2020-11-11 MED ORDER — AMLODIPINE BESYLATE 5 MG PO TABS
10.0000 mg | ORAL_TABLET | Freq: Every day | ORAL | Status: DC
  Administered 2020-11-11: 10 mg via ORAL
  Filled 2020-11-11: qty 2

## 2020-11-11 MED ORDER — FUROSEMIDE 10 MG/ML IJ SOLN: 20.0000 mg | Freq: Two times a day (BID) | INTRAMUSCULAR | Status: DC

## 2020-11-11 MED ORDER — ALLOPURINOL 300 MG PO TABS
300.0000 mg | ORAL_TABLET | Freq: Every day | ORAL | Status: DC
  Administered 2020-11-11 – 2020-11-17 (×7): 300 mg via ORAL
  Filled 2020-11-11 (×7): qty 1

## 2020-11-11 MED ORDER — IPRATROPIUM-ALBUTEROL 0.5-2.5 (3) MG/3ML IN SOLN
3.0000 mL | Freq: Once | RESPIRATORY_TRACT | Status: AC
  Administered 2020-11-11: 3 mL via RESPIRATORY_TRACT
  Filled 2020-11-11: qty 3

## 2020-11-11 MED ORDER — ACETAMINOPHEN 325 MG PO TABS
650.0000 mg | ORAL_TABLET | Freq: Four times a day (QID) | ORAL | Status: DC | PRN
  Administered 2020-11-11 – 2020-11-12 (×2): 650 mg via ORAL
  Filled 2020-11-11 (×2): qty 2

## 2020-11-11 MED ORDER — ENOXAPARIN SODIUM 60 MG/0.6ML IJ SOSY
0.5000 mg/kg | PREFILLED_SYRINGE | INTRAMUSCULAR | Status: DC
  Administered 2020-11-11 – 2020-11-12 (×2): 50 mg via SUBCUTANEOUS
  Filled 2020-11-11 (×2): qty 0.6

## 2020-11-11 MED ORDER — ONDANSETRON HCL 4 MG/2ML IJ SOLN: 4.0000 mg | Freq: Four times a day (QID) | INTRAMUSCULAR | Status: DC | PRN

## 2020-11-11 MED ORDER — ALBUTEROL SULFATE (2.5 MG/3ML) 0.083% IN NEBU
2.5000 mg | INHALATION_SOLUTION | Freq: Four times a day (QID) | RESPIRATORY_TRACT | Status: DC | PRN
  Administered 2020-11-12 – 2020-11-14 (×3): 2.5 mg via RESPIRATORY_TRACT
  Filled 2020-11-11 (×4): qty 3

## 2020-11-11 MED ORDER — ACETAMINOPHEN 650 MG RE SUPP
650.0000 mg | Freq: Four times a day (QID) | RECTAL | Status: DC | PRN
  Filled 2020-11-11: qty 1

## 2020-11-11 MED ORDER — FUROSEMIDE 10 MG/ML IJ SOLN
40.0000 mg | Freq: Two times a day (BID) | INTRAMUSCULAR | Status: DC
  Administered 2020-11-11: 40 mg via INTRAVENOUS
  Filled 2020-11-11: qty 4

## 2020-11-11 MED ORDER — METOPROLOL SUCCINATE ER 100 MG PO TB24
100.0000 mg | ORAL_TABLET | Freq: Every day | ORAL | Status: DC
  Administered 2020-11-11 – 2020-11-14 (×4): 100 mg via ORAL
  Filled 2020-11-11 (×2): qty 1
  Filled 2020-11-11 (×2): qty 2

## 2020-11-11 MED ORDER — ONDANSETRON HCL 4 MG PO TABS: 4.0000 mg | ORAL_TABLET | Freq: Four times a day (QID) | ORAL | Status: DC | PRN

## 2020-11-11 MED ORDER — HYDRALAZINE HCL 25 MG PO TABS
25.0000 mg | ORAL_TABLET | Freq: Four times a day (QID) | ORAL | Status: DC
  Administered 2020-11-11 – 2020-11-17 (×22): 25 mg via ORAL
  Filled 2020-11-11 (×22): qty 1

## 2020-11-11 MED ORDER — FUROSEMIDE 10 MG/ML IJ SOLN
40.0000 mg | Freq: Once | INTRAMUSCULAR | Status: AC
  Administered 2020-11-11: 40 mg via INTRAVENOUS
  Filled 2020-11-11: qty 4

## 2020-11-11 MED ORDER — SPIRONOLACTONE 25 MG PO TABS
25.0000 mg | ORAL_TABLET | Freq: Every day | ORAL | Status: DC
  Administered 2020-11-11 – 2020-11-13 (×3): 25 mg via ORAL
  Filled 2020-11-11 (×3): qty 1

## 2020-11-11 MED ORDER — ENALAPRIL MALEATE 20 MG PO TABS
20.0000 mg | ORAL_TABLET | Freq: Every day | ORAL | Status: DC
  Administered 2020-11-11 – 2020-11-12 (×2): 20 mg via ORAL
  Filled 2020-11-11 (×2): qty 2

## 2020-11-11 MED ORDER — SIMETHICONE 80 MG PO CHEW
80.0000 mg | CHEWABLE_TABLET | Freq: Four times a day (QID) | ORAL | Status: DC
  Administered 2020-11-11 – 2020-11-17 (×19): 80 mg via ORAL
  Filled 2020-11-11 (×29): qty 1

## 2020-11-11 NOTE — ED Provider Notes (Signed)
[facility] Provider Note   ____________________________________________   Event Date/Time   First MD Initiated Contact with Patient 11/11/20 [id number]     (approximate)  I have reviewed the triage vital signs and the nursing notes.   HISTORY  Chief Complaint Weakness  Level of V caveat: Limited by respiratory distress  HPI Benjamin Valentine is a 75 y.o. male who presents to the ED from home with a chief complaint of shortness of breath.  Patient with a history of hypertension, CAD, CHF on Lasix who reports a several day history of nonproductive cough, chest tightness, generalized weakness, increased swelling in lower legs and abdomen.  Denies fever, abdominal pain, nausea, vomiting or dizziness.  Reports compliance with Lasix.     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 Authorizing Provider  allopurinol (ZYLOPRIM) 300 MG tablet TAKE 1 TABLET BY MOUTH EVERY DAY 10/11/20  Yes [name],  Renda Rolls, MD  naproxen sodium (ANAPROX) 550 MG tablet Take 550 mg by mouth 2 (two) times daily. 09/27/20  Yes [provider]  albuterol (VENTOLIN HFA) 108 (90 Base) MCG/ACT inhaler Inhale 2 puffs into the lungs every 6 (six) hours as needed for wheezing or shortness of breath. Patient not taking: Reported on 11/11/2020 06/06/18   Emily Filbert, MD  aspirin EC 81 MG tablet Take 81 mg by mouth daily. Patient not taking: Reported on 11/11/2020    [provider]  enalapril (VASOTEC) 20 MG tablet Take 1 tablet (20 mg total) by mouth daily. Patient not taking: Reported on 11/11/2020 05/21/20   Irish Lack, [name]  furosemide (LASIX) 20 MG tablet Take 1 tablet (20 mg total) by mouth 2 (two) times daily. Patient not taking: Reported on 11/11/2020 01/26/20 01/25/21  Corky Downs, MD  meloxicam (MOBIC) 15 MG tablet Take 1 tablet (15 mg total) by mouth daily. Patient not taking: Reported on 11/11/2020 05/24/20   Irish Lack, [name]  spironolactone (ALDACTONE) 25 MG tablet TAKE 1 TABLET (25 MG TOTAL) BY MOUTH DAILY. Patient not taking: Reported on 11/11/2020 09/24/20   Corky Downs, MD    Allergies Patient has no known allergies.  History reviewed. No pertinent family history.  Social History Social History   Tobacco Use   Smoking status: Former   Smokeless tobacco: Never  Building services engineer Use: Never used  Substance Use Topics   Alcohol use: No   Drug use: No    Review of Systems  Constitutional: No fever/chills Eyes: No visual changes. ENT: No sore throat. Cardiovascular: Positive for chest pain. Respiratory: Positive for cough and shortness of breath. Gastrointestinal: No abdominal pain.  No nausea, no vomiting.  No diarrhea.  No constipation. Genitourinary: Negative for dysuria. Musculoskeletal: Negative for back pain. Skin: Negative for rash. Neurological: Negative for headaches, focal weakness or  numbness.   ____________________________________________   PHYSICAL EXAM:  VITAL SIGNS: ED Triage Vitals  Enc Vitals Group     BP 11/11/20 0216 (!) 150/91     Pulse Rate 11/11/20 0216 84     Resp 11/11/20 0216 16     Temp 11/11/20 0216 98 F (36.7 C)     Temp Source 11/11/20 0216 Oral     SpO2 11/11/20 0216 99 %     Weight 11/11/20 0212 220 lb (99.8 kg)     Height 11/11/20 0212 5\' 9"  (1.753 m)     Head Circumference --      Peak Flow --      Pain Score 11/11/20 0212 8     Pain Loc --      Pain Edu? --      Excl. in GC? --     Constitutional: Alert and oriented.  Elderly appearing and in moderate acute distress. Eyes: Conjunctivae are normal. PERRL. EOMI. Head: Atraumatic. Nose: No congestion/rhinnorhea. Mouth/Throat: Mucous membranes are mildly dry. Neck: No stridor.   Cardiovascular: Normal rate, regular rhythm. Grossly normal heart sounds.  Good peripheral circulation. Respiratory: Increased respiratory effort.  No retractions. Lungs with bibasilar rales. Gastrointestinal: Soft and nontender to light or deep palpation.  Mild distention. No abdominal bruits. No CVA tenderness. Musculoskeletal: No lower extremity tenderness.  1+ BLE pitting edema.  No joint effusions. Neurologic:  Normal speech and language. No gross focal neurologic deficits are appreciated. No gait instability. Skin:  Skin is warm, dry and intact. No rash noted. Psychiatric: Mood and affect are normal. Speech and behavior are normal.  ____________________________________________   LABS (all labs ordered are listed, but only abnormal results are displayed)  Labs Reviewed  COMPREHENSIVE METABOLIC PANEL - Abnormal; Notable for the following components:      Result Value   Glucose, Bld 123 (*)    Creatinine, Ser 1.48 (*)    Calcium 8.8 (*)    GFR, Estimated 49 (*)    All other components within normal limits  URINALYSIS, ROUTINE W REFLEX MICROSCOPIC - Abnormal; Notable for the following  components:   Protein, ur 30 (*)    All other components within normal limits  TROPONIN I (HIGH SENSITIVITY) - Abnormal; Notable for the following components:   Troponin I (High Sensitivity) 59 (*)    All other components within normal limits  TROPONIN I (HIGH SENSITIVITY) - Abnormal; Notable for the following components:   Troponin I (High Sensitivity) 52 (*)    All other components within normal limits  RESP PANEL BY RT-PCR (FLU A&B, COVID) ARPGX2  CBC WITH DIFFERENTIAL/PLATELET  LIPASE, BLOOD  URINALYSIS, MICROSCOPIC (REFLEX)   ____________________________________________  EKG  ED ECG REPORT I, Eliseo Withers J, the attending physician, personally viewed and interpreted this ECG.   Date: 11/11/2020  EKG Time: 0213  Rate: 91  Rhythm: normal sinus rhythm  Axis: Normal  Intervals:first-degree A-V block   ST&T Change: Nonspecific  ____________________________________________  RADIOLOGY I, Quintel Mccalla J, personally viewed and evaluated these images (plain radiographs) as part of my medical decision making, as well as reviewing the written report by the radiologist.  ED MD interpretation:  Cardiomegaly, curly B-lines  Official radiology report(s): DG Chest 2 View  Result Date: 11/11/2020 CLINICAL DATA:  Cough and abdominal pain EXAM: CHEST - 2 VIEW COMPARISON:  11/03/2018 FINDINGS: Moderate cardiomegaly. No pulmonary edema. Normal pleural spaces. No focal airspace consolidation. Remote median sternotomy. IMPRESSION: Cardiomegaly without pulmonary edema. Electronically Signed   By: Deatra Robinson M.D.   On: 11/11/2020 02:43    ____________________________________________   PROCEDURES  Procedure(s) performed (including Critical Care):  Procedures   ____________________________________________   INITIAL IMPRESSION / ASSESSMENT AND PLAN / ED COURSE  As part of my medical decision making, I reviewed the following data within the electronic MEDICAL RECORD NUMBER History obtained from  family, Nursing notes reviewed and incorporated, Labs reviewed, EKG interpreted, Old chart reviewed, Radiograph reviewed, Discussed with admitting physician, and Notes from prior ED visits     75 year old male presenting with shortness of breath, increased lower leg edema. Differential includes, but is not limited to, viral syndrome, bronchitis including COPD exacerbation, pneumonia, reactive airway disease including asthma, CHF including exacerbation with or without pulmonary/interstitial edema, pneumothorax, ACS, thoracic trauma, and pulmonary embolism.   Patient is sitting on the side of the bed, unable to lay in a reclined position secondary to shortness of breath.  Supplemental oxygen placed for comfort.  Laboratory results demonstrate AKI, elevated troponin most likely demand ischemia.  Will administer IV Lasix.  Will discuss with hospitalist services for admission.      ____________________________________________   FINAL CLINICAL IMPRESSION(S) / ED DIAGNOSES  Final diagnoses:  Elevated troponin  Chest pain, unspecified type  Acute on chronic congestive heart failure, unspecified heart failure type (HCC)  SOB (shortness of breath)  AKI (acute kidney injury) [facility])     ED Discharge Orders     None        Note:  This document was prepared using Dragon voice recognition software and may include unintentional dictation errors.    Irean Hong, MD 11/11/20 [phone]

## 2020-11-11 NOTE — Consult Note (Signed)
[facility] SURGICAL CONSULTATION NOTE (initial) - cpt: 16109   HISTORY OF PRESENT ILLNESS (HPI):  75 y.o. male presented to [facility] ED overnight for evaluation of SOB. Patient reported a several day history of SOB, weakness, and lower extremity swelling. ED work up was concerning for possible CHF exacerbation, and he was ultimately admitted to medicine. He did make note of a large left inguinal hernia on presentation as well. He reports this has been present for around 5 years. Typically this sticks out but he is able to manually reduce this and has to when he urinates. This is not painful. He has not pursued evaluation for this. Only previous intra-abdominal surgery is laparoscopic appendectomy.  Surgery is consulted by hospitalist physician Dr. Lindajo Royal, MD in this context for evaluation and management of left inguinal hernia.  PAST MEDICAL HISTORY (PMH):  Past Medical History:  Diagnosis Date   Arthritis    Coronary artery disease    Gout    Hypertension      PAST SURGICAL HISTORY (PSH):  Past Surgical History:  Procedure Laterality Date   CARDIAC SURGERY     CABG 2002   LAPAROSCOPIC APPENDECTOMY N/A 08/11/2018   Procedure: APPENDECTOMY LAPAROSCOPIC;  Surgeon: Henrene Dodge, MD;  Location: [facility] ORS;  Service: General;  Laterality: N/A;     MEDICATIONS:  Prior to Admission medications   Medication Sig Start Date End Date Taking? Authorizing Provider  allopurinol (ZYLOPRIM) 300 MG tablet TAKE 1 TABLET BY MOUTH EVERY DAY 10/11/20  Yes [name], Renda Rolls, MD  naproxen sodium (ANAPROX) 550 MG tablet Take 550 mg by mouth 2 (two) times daily. 09/27/20  Yes [provider]  albuterol (VENTOLIN HFA) 108 (90 Base) MCG/ACT inhaler Inhale 2 puffs into the lungs every 6 (six) hours as needed for wheezing or shortness of breath. Patient not taking: Reported on 11/11/2020 06/06/18   Emily Filbert, MD  aspirin EC 81 MG tablet Take 81 mg by mouth daily. Patient not  taking: Reported on 11/11/2020    [provider]  enalapril (VASOTEC) 20 MG tablet Take 1 tablet (20 mg total) by mouth daily. Patient not taking: Reported on 11/11/2020 05/21/20   Irish Lack, [name]  furosemide (LASIX) 20 MG tablet Take 1 tablet (20 mg total) by mouth 2 (two) times daily. Patient not taking: Reported on 11/11/2020 01/26/20 01/25/21  Corky Downs, MD  meloxicam (MOBIC) 15 MG tablet Take 1 tablet (15 mg total) by mouth daily. Patient not taking: Reported on 11/11/2020 05/24/20   Irish Lack, [name]  spironolactone (ALDACTONE) 25 MG tablet TAKE 1 TABLET (25 MG TOTAL) BY MOUTH DAILY. Patient not taking: Reported on 11/11/2020 09/24/20   Corky Downs, MD     ALLERGIES:  No Known Allergies   SOCIAL HISTORY:  Social History   Socioeconomic History   Marital status: Widowed    Spouse name: Not on file   Number of children: Not on file   Years of education: Not on file   Highest education level: Not on file  Occupational History   Not on file  Tobacco Use   Smoking status: Former   Smokeless tobacco: Never  Vaping Use   Vaping Use: Never used  Substance and Sexual Activity   Alcohol use: No   Drug use: No   Sexual activity: Not on file  Other Topics Concern   Not on file  Social History Narrative   Not on file   Social Determinants of Health   Financial Resource Strain: Not  on file  Food Insecurity: Not on file  Transportation Needs: Not on file  Physical Activity: Not on file  Stress: Not on file  Social Connections: Not on file  Intimate Partner Violence: Not on file     FAMILY HISTORY:  History reviewed. No pertinent family history.    REVIEW OF SYSTEMS:  Review of Systems  Constitutional:  Negative for chills and fever.  HENT:  Negative for congestion and sore throat.   Respiratory:  Positive for cough and shortness of breath.   Cardiovascular:  Negative for chest pain and palpitations.  Gastrointestinal:  Negative for abdominal pain, blood  in stool, constipation, diarrhea and nausea.  Genitourinary:  Negative for dysuria and urgency.       + left inguinal hernia   Neurological:  Positive for weakness. Negative for dizziness and headaches.  All other systems reviewed and are negative.  VITAL SIGNS:  Temp:  [98 F (36.7 C)] 98 F (36.7 C) (09/29 0216) Pulse Rate:  [70-87] 87 (09/29 0818) Resp:  [14-22] 19 (09/29 0818) BP: (132-166)/(78-100) 157/80 (09/29 0818) SpO2:  [97 %-100 %] 97 % (09/29 0818) Weight:  [99.8 kg] 99.8 kg (09/29 0212)     Height: 5\' 9"  (175.3 cm) Weight: 99.8 kg BMI (Calculated): 32.47   INTAKE/OUTPUT:  09/28 0701 - 09/29 0700 In: -  Out: 700 [Urine:700]  PHYSICAL EXAM:  Physical Exam Vitals and nursing note reviewed. Exam conducted with a chaperone present.  Constitutional:      General: He is not in acute distress.    Appearance: Normal appearance. He is normal weight. He is not ill-appearing.  HENT:     Head: Normocephalic and atraumatic.  Cardiovascular:     Rate and Rhythm: Normal rate and regular rhythm.  Pulmonary:     Effort: Pulmonary effort is normal. No respiratory distress.  Abdominal:     General: There is no distension.     Palpations: Abdomen is soft.     Tenderness: There is no abdominal tenderness. There is no guarding or rebound.     Hernia: A hernia is present. Hernia is present in the left inguinal area.     Comments: Abdomen is soft, non-tender, no rebound/guarding. He does have a large, soft, reducible left inguinal hernia   Skin:    General: Skin is warm and dry.     Coloration: Skin is not pale.     Findings: No erythema.  Neurological:     General: No focal deficit present.     Mental Status: He is alert and oriented to person, place, and time.  Psychiatric:        Mood and Affect: Mood normal.        Behavior: Behavior normal.     Labs:  CBC Latest Ref Rng & Units 11/11/2020 06/16/2020 04/20/2020  WBC 4.0 - 10.5 K/uL 8.1 9.1 9.7  Hemoglobin 13.0 - 17.0 g/dL  06/20/2020 12.2(L) 14.1  Hematocrit 39.0 - 52.0 % 40.7 38.6 44.0  Platelets 150 - 400 K/uL 344 327 393   CMP Latest Ref Rng & Units 11/11/2020 06/16/2020 04/20/2020  Glucose 70 - 99 mg/dL 06/20/2020) 235(T) 614(E)  BUN 8 - 23 mg/dL 21 20 19   Creatinine 0.61 - 1.24 mg/dL 315(Q) 0.08(Q)  Sodium 135 - 145 mmol/L 136 141 138  Potassium 3.5 - 5.1 mmol/L 3.9 4.4 4.2  Chloride 98 - 111 mmol/L 103 108 106  CO2 22 - 32 mmol/L 24 16(L) 24  Calcium 8.9 - 10.3 mg/dL  8.8(L) 9.3 9.1  Total Protein 6.5 - 8.1 g/dL 7.8 7.6 -  Total Bilirubin 0.3 - 1.2 mg/dL 0.9 0.6 -  Alkaline Phos 38 - 126 U/L 57 - -  AST 15 - 41 U/L 22 23 -  ALT 0 - 44 U/L 19 12 -     Imaging studies:   KUB (11/11/2020) personally reviewed and agree with radiologist report reviewed below:  IMPRESSION: Mild gaseous distention of the stomach and large bowel without evidence of mechanical obstruction.   Assessment/Plan: (ICD-10's: K40.90) 75 y.o. male with left inguinal hernia, reducible   - Nothing emergent from surgical standpoint   - I will arrange follow up with [facility] after DC for elective hernia repair   - May benefit from CT Abdomen/Pelvis at some point in the outpatient setting to evaluate whether bladder is involved in this hernia or not given his need to manual reduce this too aid in urination   - Further management per primary service; we will be available if needed   All of the above findings and recommendations were discussed with the patient and his family, and all of their questions were answered to their expressed satisfaction.  Thank you for the opportunity to participate in this patient's care.   -- Lynden Oxford, [name] [facility] 11/11/2020, 9:11 AM [phone] M-F: 7am - 4pm

## 2020-11-11 NOTE — Progress Notes (Addendum)
PROGRESS NOTE    Benjamin Valentine   ION:[id number]  DOB: 1945/06/26  DOA: 11/11/2020 PCP: Corky Downs, MD   Brief Narrative:  Benjamin Valentine is a 75 year old male with CABG in 2002, hypertension, chronic kidney disease stage III who presents to the hospital for 3 complaints.  The first is a dry cough and shortness of breath for 2 days.  He has not had any fever chills or sweats and there is no complaint of chest pain.  The second complaint is of increased swelling of his lower extremity for 2 to 3 days.  The last complaint is bloating/distention of his abdomen.  He thought he might of been constipated even though he was having regular bowel movements.  He decided to take MiraLAX and this produced some more bowel movements but his abdominal swelling did not resolve. In the ED, work-up was essentially normal-chest x-ray revealed cardiomegaly but no pulmonary edema.  The patient was treated with a DuoNeb and IV Lasix and subsequently admitted for acute heart failure.    Subjective: Patient continues to have a cough.  His abdomen is quite distended but he has no pain in his abdomen.  He has no nausea or vomiting.    Assessment & Plan:   Principal Problem:   Acute dyspnea with tachypnea and cough -Symptoms have been bad for 2 days -There is wheezing on exam mainly in the left lung field - He was a smoker for 30 years before quitting in his 80s - At this time I have a high suspicion for an acute viral infection causing bronchitis - Bood pressure is quite elevated and he likely has acute diastolic heart failure associated with this-pedal edema is noted -Lastly, abdomen is severely distended and this is limiting him from taking full breaths - I have ordered a respiratory virus panel and will continue IV Lasix - Follow-up on 2D echo  Active Problems:  Essential hypertension uncontrolled - Patient's blood pressure this morning is 172/118 - Continue Toprol at 100 mg daily, enalapril 20 mg  daily, spironolactone 25 mg daily - Add amlodipine 10 mg daily - Add hydralazine as needed  Abdominal distention - Bowel sounds are hypoactive, abdomen is moderate to severely distended - Have ordered stat abdominal x-rays - As he has no vomiting, will allow him to continue to eat for now     Chronic gout due to renal impairment involving foot without tophus -Continue allopurinol    Stage 3a chronic kidney disease (HCC) -Baseline creatinine appears to be 1.8-1.5 -Continue to follow    Hx of CABG   Elevated troponin -Troponin elevation is likely demand ischemia secondary to high blood pressure, pulmonary issues in the setting of poor renal clearance    Time spent in minutes: 35 DVT prophylaxis:   Lovenox  Code Status: full code Family Communication:  Level of Care: Level of care: Progressive Cardiac Disposition Plan:  Status is: Observation  The patient will require care spanning > 2 midnights and should be moved to inpatient because: IV treatments appropriate due to intensity of illness or inability to take PO  Dispo: The patient is from: Home              Anticipated d/c is to: Home              Patient currently is not medically stable to d/c.   Difficult to place patient No      Consultants:  none Procedures:  none Antimicrobials:  Anti-infectives (From admission,  onward)    None        Objective: Vitals:   11/11/20 0600 11/11/20 0630 11/11/20 0700 11/11/20 0818  BP: (!) 166/100 132/78 (!) 154/94 (!) 157/80  Pulse: 74   87  Resp: (!) 22 18 14 19   Temp:      TempSrc:      SpO2: 100%   97%  Weight:      Height:        Intake/Output Summary (Last 24 hours) at 11/11/2020 0933 Last data filed at 11/11/2020 0447 Gross per 24 hour  Intake --  Output 700 ml  Net -700 ml   Filed Weights   11/11/20 11/13/20  Weight: 99.8 kg    Examination: General exam: Appears comfortable  HEENT: PERRLA, oral mucosa moist, no sclera icterus or thrush Respiratory  system: wheezing in left lung field, unable to take deep breaths, has a cough,  resp rate is elevated Cardiovascular system: S1 & S2 heard, RRR.   Gastrointestinal system: Abdomen soft, non-tender, moderate to severe distention is noted along with hypoactive bowel sounds  Central nervous system: Alert and oriented. No focal neurological deficits. Extremities: No cyanosis, clubbing -1+ pitting edema Skin: No rashes or ulcers Psychiatry:  Mood & affect appropriate.     Data Reviewed: I have personally reviewed following labs and imaging studies  CBC: Recent Labs  Lab 11/11/20 0215  WBC 8.1  NEUTROABS 4.3  HGB 13.3  HCT 40.7  MCV 92.1  PLT 344   Basic Metabolic Panel: Recent Labs  Lab 11/11/20 0215  NA 136  K 3.9  CL 103  CO2 24  GLUCOSE 123*  BUN 21  CREATININE 1.48*  CALCIUM 8.8*   GFR: Estimated Creatinine Clearance: 49.4 mL/min (A) (by C-G formula based on SCr of 1.48 mg/dL (H)). Liver Function Tests: Recent Labs  Lab 11/11/20 0215  AST 22  ALT 19  ALKPHOS 57  BILITOT 0.9  PROT 7.8  ALBUMIN 4.1   Recent Labs  Lab 11/11/20 0215  LIPASE 43   No results for input(s): AMMONIA in the last 168 hours. Coagulation Profile: No results for input(s): INR, PROTIME in the last 168 hours. Cardiac Enzymes: No results for input(s): CKTOTAL, CKMB, CKMBINDEX, TROPONINI in the last 168 hours. BNP (last 3 results) No results for input(s): PROBNP in the last 8760 hours. HbA1C: No results for input(s): HGBA1C in the last 72 hours. CBG: No results for input(s): GLUCAP in the last 168 hours. Lipid Profile: No results for input(s): CHOL, HDL, LDLCALC, TRIG, CHOLHDL, LDLDIRECT in the last 72 hours. Thyroid Function Tests: No results for input(s): TSH, T4TOTAL, FREET4, T3FREE, THYROIDAB in the last 72 hours. Anemia Panel: No results for input(s): VITAMINB12, FOLATE, FERRITIN, TIBC, IRON, RETICCTPCT in the last 72 hours. Urine analysis:    Component Value Date/Time    COLORURINE YELLOW 11/11/2020 0339   APPEARANCEUR CLEAR 11/11/2020 0339   LABSPEC 1.010 11/11/2020 0339   PHURINE 6.0 11/11/2020 0339   GLUCOSEU NEGATIVE 11/11/2020 0339   HGBUR NEGATIVE 11/11/2020 0339   BILIRUBINUR NEGATIVE 11/11/2020 0339   KETONESUR NEGATIVE 11/11/2020 0339   PROTEINUR 30 (A) 11/11/2020 0339   NITRITE NEGATIVE 11/11/2020 0339   LEUKOCYTESUR NEGATIVE 11/11/2020 0339   Sepsis Labs: @LABRCNTIP (procalcitonin:4,lacticidven:4) ) Recent Results (from the past 240 hour(s))  Resp Panel by RT-PCR (Flu A&B, Covid) Nasopharyngeal Swab     Status: None   Collection Time: 11/11/20  3:39 AM   Specimen: Nasopharyngeal Swab; Nasopharyngeal(NP) swabs in vial transport medium  Result  Value Ref Range Status   SARS Coronavirus 2 by RT PCR NEGATIVE NEGATIVE Final    Comment: (NOTE) SARS-CoV-2 target nucleic acids are NOT DETECTED.  The SARS-CoV-2 RNA is generally detectable in upper respiratory specimens during the acute phase of infection. The lowest concentration of SARS-CoV-2 viral copies this assay can detect is 138 copies/mL. A negative result does not preclude SARS-Cov-2 infection and should not be used as the sole basis for treatment or other patient management decisions. A negative result may occur with  improper specimen collection/handling, submission of specimen other than nasopharyngeal swab, presence of viral mutation(s) within the areas targeted by this assay, and inadequate number of viral copies(<138 copies/mL). A negative result must be combined with clinical observations, patient history, and epidemiological information. The expected result is Negative.  Fact Sheet for Patients:  BloggerCourse.com  Fact Sheet for Healthcare Providers:  SeriousBroker.it  This test is no t yet approved or cleared by the [place] FDA and  has been authorized for detection and/or diagnosis of SARS-CoV-2 by FDA under an  Emergency Use Authorization (EUA). This EUA will remain  in effect (meaning this test can be used) for the duration of the COVID-19 declaration under Section 564(b)(1) of the Act, 21 U.S.C.section 360bbb-3(b)(1), unless the authorization is terminated  or revoked sooner.       Influenza A by PCR NEGATIVE NEGATIVE Final   Influenza B by PCR NEGATIVE NEGATIVE Final    Comment: (NOTE) The Xpert Xpress SARS-CoV-2/FLU/RSV plus assay is intended as an aid in the diagnosis of influenza from Nasopharyngeal swab specimens and should not be used as a sole basis for treatment. Nasal washings and aspirates are unacceptable for Xpert Xpress SARS-CoV-2/FLU/RSV testing.  Fact Sheet for Patients: BloggerCourse.com  Fact Sheet for Healthcare Providers: SeriousBroker.it  This test is not yet approved or cleared by the [place] FDA and has been authorized for detection and/or diagnosis of SARS-CoV-2 by FDA under an Emergency Use Authorization (EUA). This EUA will remain in effect (meaning this test can be used) for the duration of the COVID-19 declaration under Section 564(b)(1) of the Act, 21 U.S.C. section 360bbb-3(b)(1), unless the authorization is terminated or revoked.  Performed at [facility], [address], [place], [place] [address]          Radiology Studies: DG Chest 2 View  Result Date: 11/11/2020 CLINICAL DATA:  Cough and abdominal pain EXAM: CHEST - 2 VIEW COMPARISON:  11/03/2018 FINDINGS: Moderate cardiomegaly. No pulmonary edema. Normal pleural spaces. No focal airspace consolidation. Remote median sternotomy. IMPRESSION: Cardiomegaly without pulmonary edema. Electronically Signed   By: Deatra Robinson M.D.   On: 11/11/2020 02:43      Scheduled Meds:  allopurinol  300 mg Oral Daily   amLODipine  10 mg Oral Daily   enalapril  20 mg Oral Daily   enoxaparin (LOVENOX) injection  0.5 mg/kg Subcutaneous Q24H    furosemide  40 mg Intravenous BID   metoprolol succinate  100 mg Oral Daily   spironolactone  25 mg Oral Daily   Continuous Infusions:   LOS: 0 days      Calvert Cantor, MD [facility] Pager: [email or web address] 11/11/2020, 9:33 AM

## 2020-11-11 NOTE — ED Notes (Signed)
Pt has been urinating but family has been discarding urine in the toilet, this RN is unable to measure it.

## 2020-11-11 NOTE — ED Notes (Signed)
Ambulatory to the restroom at this time. Pt needed to have a BM.

## 2020-11-11 NOTE — ED Notes (Signed)
Pt alert, watching tv.  Family with pt.

## 2020-11-11 NOTE — Progress Notes (Signed)
*  PRELIMINARY RESULTS* Echocardiogram 2D Echocardiogram has been performed.  Cristela Blue 11/11/2020, 9:50 AM

## 2020-11-11 NOTE — ED Notes (Signed)
General surgery at bedside at this time.

## 2020-11-11 NOTE — ED Triage Notes (Signed)
Pt to triage via w/c with no distress noted; st last few days having abd pain, bloating, weakness and nonprod cough; denies hx of same

## 2020-11-11 NOTE — Progress Notes (Signed)
Anticoagulation monitoring(Lovenox):  75 yo male ordered Lovenox 40 mg Q24h    Filed Weights   11/11/20 7824  Weight: 99.8 kg (220 lb)   BMI 32.5     Lab Results  Component Value Date   CREATININE 1.48 (H) 11/11/2020   CREATININE 1.18 06/16/2020   CREATININE 1.47 (H) 04/20/2020   Estimated Creatinine Clearance: 49.4 mL/min (A) (by C-G formula based on SCr of 1.48 mg/dL (H)). Hemoglobin & Hematocrit     Component Value Date/Time   HGB 13.3 11/11/2020 0215   HGB 14.1 09/20/2013 0233   HCT 40.7 11/11/2020 0215   HCT 42.0 09/20/2013 0233     Per Protocol for Patient with estCrcl > 30 ml/min and BMI > 30, will transition to Lovenox 50 mg Q24h.

## 2020-11-11 NOTE — H&P (Signed)
History and Physical    Benjamin Valentine YOV:[id number] DOB: 1945-10-21 DOA: 11/11/2020  PCP: Corky Downs, MD   Patient coming from: home  I have personally briefly reviewed patient's old medical records in [facility]  Chief Complaint: shortness of breath  HPI: Benjamin Valentine is a 75 y.o. male with medical history significant for CABG 2002,?  CHF, gout, CKD stage IIIa and hypertension who presents to the ED with a several day history of shortness of breath, weakness, sensation of abdominal fullness and lower extremity swelling.  Has a nonproductive cough but denies fever or chills.  Denies abdominal pain, nausea vomiting or diarrhea.  Patient mentions that he has a hernia on the left groin for the past 5 years but denies pain or constipation related to it  ED course: On arrival vitals within normal limits except for slightly elevated blood pressure of 150/91 Blood work: Significant for elevated troponin of 59.  Creatinine 1.48 which appears to be baseline.  Blood work otherwise WNL.  BNP pending  EKG, personally viewed and interpreted: T wave inversion lateral leads.  NSR at 91  Chest x-ray: Cardiomegaly without pulmonary edema.  Remote median sternotomy  Patient treated with a DuoNeb and given IV Lasix.  Hospitalist consulted for admission for suspected CHF exacerbation. Review of Systems: As per HPI otherwise all other systems on review of systems negative.    Past Medical History:  Diagnosis Date   Arthritis    Coronary artery disease    Gout    Hypertension     Past Surgical History:  Procedure Laterality Date   CARDIAC SURGERY     CABG 2002   LAPAROSCOPIC APPENDECTOMY N/A 08/11/2018   Procedure: APPENDECTOMY LAPAROSCOPIC;  Surgeon: Henrene Dodge, MD;  Location: [facility] ORS;  Service: General;  Laterality: N/A;     reports that he has quit smoking. He has never used smokeless tobacco. He reports that he does not drink alcohol and does not use drugs.  No Known  Allergies  History reviewed. No pertinent family history.    Prior to Admission medications   Medication Sig Start Date End Date Taking? Authorizing Provider  albuterol (VENTOLIN HFA) 108 (90 Base) MCG/ACT inhaler Inhale 2 puffs into the lungs every 6 (six) hours as needed for wheezing or shortness of breath. 06/06/18   Emily Filbert, MD  allopurinol (ZYLOPRIM) 300 MG tablet TAKE 1 TABLET BY MOUTH EVERY DAY 10/11/20   Corky Downs, MD  aspirin EC 81 MG tablet Take 81 mg by mouth daily.    [provider]  Colchicine (MITIGARE) 0.6 MG CAPS Take 0.6 mg by mouth daily. 05/21/20   Irish Lack, [name]  enalapril (VASOTEC) 20 MG tablet Take 1 tablet (20 mg total) by mouth daily. 05/21/20   Irish Lack, [name]  furosemide (LASIX) 20 MG tablet Take 1 tablet (20 mg total) by mouth 2 (two) times daily. 01/26/20 01/25/21  Corky Downs, MD  HYDROcodone-acetaminophen (NORCO) 5-325 MG tablet Take 1 tablet by mouth 3 (three) times daily as needed. 04/20/20   Menshew, Charlesetta Ivory, [name]  ibuprofen (ADVIL) 800 MG tablet Take 1 tablet (800 mg total) by mouth every 8 (eight) hours as needed. 01/26/20   Corky Downs, MD  meloxicam (MOBIC) 15 MG tablet Take 1 tablet (15 mg total) by mouth daily. 05/24/20   Irish Lack, [name]  metoprolol succinate (TOPROL-XL) 100 MG 24 hr tablet Take 1 tablet (100 mg total) by mouth daily. Take with or immediately following a meal.  05/21/20 08/19/20  Irish Lack, [name]  spironolactone (ALDACTONE) 25 MG tablet TAKE 1 TABLET (25 MG TOTAL) BY MOUTH DAILY. 09/24/20   Corky Downs, MD    Physical Exam: Vitals:   11/11/20 0212 11/11/20 0216 11/11/20 0400  BP:  (!) 150/91 (!) 153/99  Pulse:  84 77  Resp:  16 (!) 21  Temp:  98 F (36.7 C)   TempSrc:  Oral   SpO2:  99% 100%  Weight: 99.8 kg    Height: 5\' 9"  (1.753 m)       Vitals:   11/11/20 0212 11/11/20 0216 11/11/20 0400  BP:  (!) 150/91 (!) 153/99  Pulse:  84 77  Resp:  16 (!) 21  Temp:  98 F (36.7 C)    TempSrc:  Oral   SpO2:  99% 100%  Weight: 99.8 kg    Height: 5\' 9"  (1.753 m)        Constitutional: Alert and oriented x 3 . Not in any apparent distress HEENT:      Head: Normocephalic and atraumatic.         Eyes: PERLA, EOMI, Conjunctivae are normal. Sclera is non-icteric.       Mouth/Throat: Mucous membranes are moist.       Neck: Supple with no signs of meningismus. Cardiovascular: Regular rate and rhythm. No murmurs, gallops, or rubs. 2+ symmetrical distal pulses are present . No JVD.  1-2+ LE edema Respiratory: Respiratory effort normal .Lungs sounds clear bilaterally. No wheezes, crackles, or rhonchi.  Gastrointestinal: Soft, non tender, and non distended with positive bowel sounds.  Genitourinary: No CVA tenderness.  Large nonreducible left inguinal hernia, nontender Musculoskeletal: Nontender with normal range of motion in all extremities. No cyanosis, or erythema of extremities. Neurologic:  Face is symmetric. Moving all extremities. No gross focal neurologic deficits . Skin: Skin is warm, dry.  No rash or ulcers Psychiatric: Mood and affect are normal    Labs on Admission: I have personally reviewed following labs and imaging studies  CBC: Recent Labs  Lab 11/11/20 0215  WBC 8.1  NEUTROABS 4.3  HGB 13.3  HCT 40.7  MCV 92.1  PLT 344   Basic Metabolic Panel: Recent Labs  Lab 11/11/20 0215  NA 136  K 3.9  CL 103  CO2 24  GLUCOSE 123*  BUN 21  CREATININE 1.48*  CALCIUM 8.8*   GFR: Estimated Creatinine Clearance: 49.4 mL/min (A) (by C-G formula based on SCr of 1.48 mg/dL (H)). Liver Function Tests: Recent Labs  Lab 11/11/20 0215  AST 22  ALT 19  ALKPHOS 57  BILITOT 0.9  PROT 7.8  ALBUMIN 4.1   Recent Labs  Lab 11/11/20 0215  LIPASE 43   No results for input(s): AMMONIA in the last 168 hours. Coagulation Profile: No results for input(s): INR, PROTIME in the last 168 hours. Cardiac Enzymes: No results for input(s): CKTOTAL, CKMB,  CKMBINDEX, TROPONINI in the last 168 hours. BNP (last 3 results) No results for input(s): PROBNP in the last 8760 hours. HbA1C: No results for input(s): HGBA1C in the last 72 hours. CBG: No results for input(s): GLUCAP in the last 168 hours. Lipid Profile: No results for input(s): CHOL, HDL, LDLCALC, TRIG, CHOLHDL, LDLDIRECT in the last 72 hours. Thyroid Function Tests: No results for input(s): TSH, T4TOTAL, FREET4, T3FREE, THYROIDAB in the last 72 hours. Anemia Panel: No results for input(s): VITAMINB12, FOLATE, FERRITIN, TIBC, IRON, RETICCTPCT in the last 72 hours. Urine analysis:    Component Value Date/Time  COLORURINE YELLOW (A) 09/09/2018 0952   APPEARANCEUR CLEAR (A) 09/09/2018 0952   LABSPEC 1.011 09/09/2018 0952   PHURINE 5.0 09/09/2018 0952   GLUCOSEU NEGATIVE 09/09/2018 0952   HGBUR NEGATIVE 09/09/2018 0952   BILIRUBINUR NEGATIVE 09/09/2018 0952   KETONESUR NEGATIVE 09/09/2018 0952   PROTEINUR NEGATIVE 09/09/2018 0952   NITRITE NEGATIVE 09/09/2018 0952   LEUKOCYTESUR NEGATIVE 09/09/2018 0952    Radiological Exams on Admission: DG Chest 2 View  Result Date: 11/11/2020 CLINICAL DATA:  Cough and abdominal pain EXAM: CHEST - 2 VIEW COMPARISON:  11/03/2018 FINDINGS: Moderate cardiomegaly. No pulmonary edema. Normal pleural spaces. No focal airspace consolidation. Remote median sternotomy. IMPRESSION: Cardiomegaly without pulmonary edema. Electronically Signed   By: Deatra Robinson M.D.   On: 11/11/2020 02:43     Assessment/Plan 75 year old male with history of CABG 2002,?  CHF, gout, CKD stage IIIa and hypertension presenting with dyspnea and generalized weakness    Acute dyspnea, suspect heart failure exacerbation, unspecified ?  Prior history of CHF -Etiology suspect related to probable CHF.  Patient noted to be on enalapril, metoprolol and furosemide - Follow-up BNP.  Chest x-ray without pulmonary vascular congestion - Trend troponins to evaluate for ACS - Daily  weights with intake and output monitoring - IV Lasix and continue home metoprolol and enalapril - Echocardiogram in the a.m.    Elevated troponin   Hx of CABG - Troponin 59 and EKG showing T wave inversion lateral leads.  No complaints of chest pain - Continue to trend - Continue aspirin and metoprolol.  Not currently on a statin - Echo to evaluate for focal wall motion abnormality - Consider cardiology consult  Left inguinal scrotal hernia - No acute issues suspected - Surgical consult for recommendations    Essential hypertension - Continue enalapril and metoprolol    Stage 3a chronic kidney disease (HCC) - Renal function at baseline - Continue to monitor in view of IV diuretic therapy    Gout - Not acutely flared - Avoid ibuprofen and meloxicam as seen on med list, not yet    DVT prophylaxis: Lovenox  Code Status: full code  Family Communication:  son at bedside Disposition Plan: Back to previous home environment Consults called: Cardiology and surgery Status:observation     Andris Baumann MD [facility]     11/11/2020, 4:26 AM

## 2020-11-11 NOTE — Consult Note (Addendum)
Cardiology Consultation:   Patient ID: DEQUAVIOUS Valentine MRN: [id number]; DOB: 1945/04/01  Admit date: 11/11/2020 Date of Consult: 11/11/2020  PCP:  Corky Downs, MD   [facility]  Cardiologist: [facility], Dr. Mariah Milling rounding Advanced Practice Provider:  No care team member to display Electrophysiologist:  None 6}    Patient Profile:   Benjamin Valentine is a 75 y.o. male with a hx of coronary artery disease s/p bypass surgery 2002, hypertension, hyperlipidemia, osteoarthritis, gout, previous COVID-19 infection, and who is being seen today for the evaluation of elevated troponin and suspected heart failure at the request of Dr. Butler Denmark.  History of Present Illness:   Mr. Benjamin Valentine is a 75 year old male with PMH as above.  He reports previous tobacco use and alcohol use but has quit for several years, though unable to specify the number. He reports a strong family history of heart disease and MI with most family members passing from MIs in their 36s to 89s.   He does not follow regularly with a cardiologist. He has been followed by Dr. Harl Bowie in the past with PTA medications including spironolactone, methylprednisolone, Lasix, and Vasotec.  He is unsure of what medications he has prescribed and does not feel that he has been taking his cardiac medications.  He states that he is not taking his fluid pills.    He admits to drinking a lot of fluid and recently has restarted his sodas.  He does not monitor his salt does add salt to all of his food.  He has gradually been feeling increasing shortness of breath for some time.  On Tuesday, 11/02/2020, he reports his shortness of breath increased significantly.  He also noticed abdominal distention/bloating and orthopnea at that time.  He felt as if he could not breathe.  He was not sleeping, because he was afraid he would pass away in his sleep due to not breathing, given his significant shortness of breath.  He had a nonproductive  cough.  He had not noticed his lower extremity edema, though it was noticed on exam today.  He denied any dizziness or tachypalpitations.  No chest pain.  No signs or symptoms of bleeding.  He did feel weak and fatigued, though this was in the setting of not sleeping.  In the emergency department, initial BP 150/91.  High-sensitivity troponin 59.  Creatinine 1.48.  EKG showed T wave inversion in the inferolateral leads as below. Chest x-ray showed cardiomegaly without pulmonary edema.  Abdominal imaging showed mild gaseous distention of the stomach and large bowel without obstruction.  He was started on IV Lasix and breathing treatments.  Echo read by an outside cardiologist showed EF 25 to 30%, LV global hypokinesis, mild to moderate LVE, mild MR.   Past Medical History:  Diagnosis Date   Arthritis    Coronary artery disease    Gout    Hypertension     Past Surgical History:  Procedure Laterality Date   CARDIAC SURGERY     CABG 2002   LAPAROSCOPIC APPENDECTOMY N/A 08/11/2018   Procedure: APPENDECTOMY LAPAROSCOPIC;  Surgeon: Henrene Dodge, MD;  Location: [facility] ORS;  Service: General;  Laterality: N/A;     Home Medications:  Prior to Admission medications   Medication Sig Start Date End Date Taking? Authorizing Provider  allopurinol (ZYLOPRIM) 300 MG tablet TAKE 1 TABLET BY MOUTH EVERY DAY 10/11/20  Yes [name], Renda Rolls, MD  naproxen sodium (ANAPROX) 550 MG tablet Take 550 mg by mouth 2 (two)  times daily. 09/27/20  Yes [provider]  albuterol (VENTOLIN HFA) 108 (90 Base) MCG/ACT inhaler Inhale 2 puffs into the lungs every 6 (six) hours as needed for wheezing or shortness of breath. Patient not taking: Reported on 11/11/2020 06/06/18   Emily Filbert, MD  aspirin EC 81 MG tablet Take 81 mg by mouth daily. Patient not taking: Reported on 11/11/2020    [provider]  enalapril (VASOTEC) 20 MG tablet Take 1 tablet (20 mg total) by mouth daily. Patient not taking:  Reported on 11/11/2020 05/21/20   Irish Lack, [name]  furosemide (LASIX) 20 MG tablet Take 1 tablet (20 mg total) by mouth 2 (two) times daily. Patient not taking: Reported on 11/11/2020 01/26/20 01/25/21  Corky Downs, MD  meloxicam (MOBIC) 15 MG tablet Take 1 tablet (15 mg total) by mouth daily. Patient not taking: Reported on 11/11/2020 05/24/20   Irish Lack, [name]  spironolactone (ALDACTONE) 25 MG tablet TAKE 1 TABLET (25 MG TOTAL) BY MOUTH DAILY. Patient not taking: Reported on 11/11/2020 09/24/20   Corky Downs, MD    Inpatient Medications: Scheduled Meds:  allopurinol  300 mg Oral Daily   amLODipine  10 mg Oral Daily   enalapril  20 mg Oral Daily   enoxaparin (LOVENOX) injection  0.5 mg/kg Subcutaneous Q24H   furosemide  40 mg Intravenous BID   metoprolol succinate  100 mg Oral Daily   spironolactone  25 mg Oral Daily   Continuous Infusions:  PRN Meds: acetaminophen **OR** acetaminophen, albuterol, hydrALAZINE, ondansetron **OR** ondansetron (ZOFRAN) IV  Allergies:   No Known Allergies  Social History:   Social History   Socioeconomic History   Marital status: Widowed    Spouse name: Not on file   Number of children: Not on file   Years of education: Not on file   Highest education level: Not on file  Occupational History   Not on file  Tobacco Use   Smoking status: Former   Smokeless tobacco: Never  Vaping Use   Vaping Use: Never used  Substance and Sexual Activity   Alcohol use: No   Drug use: No   Sexual activity: Not on file  Other Topics Concern   Not on file  Social History Narrative   Not on file   Social Determinants of Health   Financial Resource Strain: Not on file  Food Insecurity: Not on file  Transportation Needs: Not on file  Physical Activity: Not on file  Stress: Not on file  Social Connections: Not on file  Intimate Partner Violence: Not on file    Family History:   History reviewed. No pertinent family history.  Reports a family  history of hypertension, heart failure, and MIs.  Reports cardiac death within the family now usually at age 57-80.  ROS:  Please see the history of present illness.  Review of Systems  Constitutional:  Positive for malaise/fatigue.  Respiratory:  Positive for cough, shortness of breath and wheezing. Negative for hemoptysis.   Cardiovascular:  Positive for orthopnea. Negative for chest pain, palpitations and leg swelling.       He did not notice his leg swelling until pointed out in the emergency department  Gastrointestinal:  Positive for abdominal pain. Negative for blood in stool and melena.  Neurological:  Negative for dizziness and loss of consciousness.  All other systems reviewed and are negative.  All other ROS reviewed and negative.     Physical Exam/Data:   Vitals:   11/11/20 1100 11/11/20  1130 11/11/20 1200 11/11/20 1300  BP: (!) 137/99 138/77 (!) 154/88 132/84  Pulse: 65 67 72 71  Resp: Temp:      TempSrc:      SpO2: 97% 96% 99% 99%  Weight:      Height:        Intake/Output Summary (Last 24 hours) at 11/11/2020 1404 Last data filed at 11/11/2020 1259 Gross per 24 hour  Intake --  Output 1100 ml  Net -1100 ml   Last 3 Weights 11/11/2020 06/23/2020 06/16/2020  Weight (lbs) 220 lb 201 lb 8 oz 201 lb 6.4 oz  Weight (kg) 99.791 kg 91.4 kg 91.354 kg     Body mass index is 32.49 kg/m.  General: Elderly male, no acute distress.  Joined by family. HEENT: normal Lymph: no adenopathy Neck: JVP approximately 11 cm  vascular: Radial pulses 2+ bilaterally Cardiac:  normal S1, S2; RRR; no murmur  Lungs: Bibasilar crackles, worse on the left Abd: Slightly distended and firm Ext: 2+ bilateral lower extremity edema  musculoskeletal:  No deformities, BUE and BLE strength normal and equal Skin: warm and dry  Neuro:  CNs 2-12 intact, no focal abnormalities noted Psych:  Normal affect   EKG:  The EKG was personally reviewed and demonstrates:  SR, 91 bpm,  first-degree AV block with PR interval 234, IVCD with QRS 100, poor R wave progression, T wave inversion in the anterior lateral leads Telemetry:  Telemetry was personally reviewed and demonstrates: Sinus rhythm with PACs and PVCs, sinus pauses/compensatory pauses approximately 2 seconds  Relevant CV Studies: Echo 11/11/2020  1. Left ventricular ejection fraction, by estimation, is 25 to 30%. The  left ventricle has severely decreased function. The left ventricle  demonstrates global hypokinesis. The left ventricular internal cavity size  was mildly to moderately dilated. Left  ventricular diastolic parameters were normal.   2. Right ventricular systolic function is normal. The right ventricular  size is normal.   3. The mitral valve is normal in structure. Mild mitral valve  regurgitation.   4. The aortic valve is normal in structure. Aortic valve regurgitation is  not visualized.   Laboratory Data:  High Sensitivity Troponin:   Recent Labs  Lab 11/11/20 0215 11/11/20 0429  TROPONINIHS 59* 52*     Chemistry Recent Labs  Lab 11/11/20 0215  NA 136  K 3.9  CL 103  CO2 24  GLUCOSE 123*  BUN 21  CREATININE 1.48*  CALCIUM 8.8*  GFRNONAA 49*  ANIONGAP 9    Recent Labs  Lab 11/11/20 0215  PROT 7.8  ALBUMIN 4.1  AST 22  ALT 19  ALKPHOS 57  BILITOT 0.9   Hematology Recent Labs  Lab 11/11/20 0215  WBC 8.1  RBC 4.42  HGB 13.3  HCT 40.7  MCV 92.1  MCH 30.1  MCHC 32.7  RDW 15.5  PLT 344   BNPNo results for input(s): BNP, PROBNP in the last 168 hours.  DDimer No results for input(s): DDIMER in the last 168 hours.   Radiology/Studies:  DG Chest 2 View  Result Date: 11/11/2020 CLINICAL DATA:  Cough and abdominal pain EXAM: CHEST - 2 VIEW COMPARISON:  11/03/2018 FINDINGS: Moderate cardiomegaly. No pulmonary edema. Normal pleural spaces. No focal airspace consolidation. Remote median sternotomy. IMPRESSION: Cardiomegaly without pulmonary edema. Electronically  Signed   By: Deatra Robinson M.D.   On: 11/11/2020 02:43   DG Abd 2 Views  Result Date: 11/11/2020 CLINICAL DATA:  Abdominal pain EXAM:  ABDOMEN - 2 VIEW COMPARISON:  CTA abdomen/pelvis 09/10/2018 FINDINGS: There is mild gaseous distention of the stomach and large bowel without evidence of mechanical obstruction. There is a mild stool burden projecting over the rectum. No free intraperitoneal air is identified. Median sternotomy wires are noted. The lung bases are clear. There is no acute osseous abnormality. IMPRESSION: Mild gaseous distention of the stomach and large bowel without evidence of mechanical obstruction. Electronically Signed   By: Lesia Hausen M.D.   On: 11/11/2020 09:51   ECHOCARDIOGRAM COMPLETE  Result Date: 11/11/2020    ECHOCARDIOGRAM REPORT   Patient Name:   SULLIVAN JACUINDE Boonstra Date of Exam: 11/11/2020 Medical Rec #:  [id number]      Height:       69.0 in Accession #:    [id number]     Weight:       220.0 lb Date of Birth:  23-Apr-1945       BSA:          2.151 m Patient Age:    75 years       BP:           154/94 mmHg Patient Gender: M              HR:           74 bpm. Exam Location:  [facility] Procedure: 2D Echo, Cardiac Doppler and Color Doppler Indications:     CHF-acute diastolic I50.31  History:         Patient has no prior history of Echocardiogram examinations.                  CAD; Risk Factors:Hypertension.  Sonographer:     Cristela Blue Referring Phys:  [id number] Andris Baumann Diagnosing Phys: Arnoldo Hooker MD  Sonographer Comments: Technically challenging study due to limited acoustic windows. The only view obtainable was parasternal. IMPRESSIONS  1. Left ventricular ejection fraction, by estimation, is 25 to 30%. The left ventricle has severely decreased function. The left ventricle demonstrates global hypokinesis. The left ventricular internal cavity size was mildly to moderately dilated. Left ventricular diastolic parameters were normal.  2. Right ventricular systolic function is normal. The  right ventricular size is normal.  3. The mitral valve is normal in structure. Mild mitral valve regurgitation.  4. The aortic valve is normal in structure. Aortic valve regurgitation is not visualized. FINDINGS  Left Ventricle: Left ventricular ejection fraction, by estimation, is 25 to 30%. The left ventricle has severely decreased function. The left ventricle demonstrates global hypokinesis. The left ventricular internal cavity size was mildly to moderately dilated. There is no left ventricular hypertrophy. Left ventricular diastolic parameters were normal. Right Ventricle: The right ventricular size is normal. No increase in right ventricular wall thickness. Right ventricular systolic function is normal. Left Atrium: Left atrial size was normal in size. Right Atrium: Right atrial size was normal in size. Pericardium: There is no evidence of pericardial effusion. Mitral Valve: The mitral valve is normal in structure. Mild mitral valve regurgitation. Tricuspid Valve: The tricuspid valve is normal in structure. Tricuspid valve regurgitation is mild. Aortic Valve: The aortic valve is normal in structure. Aortic valve regurgitation is not visualized. Pulmonic Valve: The pulmonic valve was normal in structure. Pulmonic valve regurgitation is trivial. Aorta: The aortic root and ascending aorta are structurally normal, with no evidence of dilitation. IAS/Shunts: No atrial level shunt detected by color flow Doppler.  LEFT VENTRICLE PLAX 2D LVIDd:  5.50 cm LVIDs:         5.00 cm LV PW:         1.60 cm LV IVS:        1.80 cm LVOT diam:     2.00 cm LVOT Area:     3.14 cm  LEFT ATRIUM         Index LA diam:    4.60 cm 2.14 cm/m                        PULMONIC VALVE AORTA                 PV Vmax:          0.43 m/s Ao Root diam: 3.40 cm PV Peak grad:     0.7 mmHg                       PR End Diast Vel: 18.32 msec                       RVOT Peak grad:   1 mmHg   SHUNTS Systemic Diam: 2.00 cm Arnoldo Hooker MD  Electronically signed by Arnoldo Hooker MD Signature Date/Time: 11/11/2020/12:13:54 PM    Final      Assessment and Plan:   Acute on chronic HFrEF --Presented with shortness of breath and volume overloaded on exam.  On reflection, he feels as if his shortness of breath has been progressing for some time now.  He has not been taking his medications as prescribed or regularly following with a cardiologist.  He also consumes a lot of fluid and salt.  Echo (read by an outside cardiologist) performed with EF 25 to 30%, LV global hypokinesis.  No previous echo available on review of EMR.  Volume up today with recommendation for gentle  IV diuresis as Cr allows, given AKI / bump in Cr from previous labs. Consider that Cr may improve with improvement in volume status and BP.  Will repeat tomorrow a.m. and titrate diuresis as needed. In addition, recommend BP/HR control and escalation of GDMT as tolerated and to include ACE/ARB/Entresto, spironolactone, and SGLT2 inhibitor (if Cr/K/BP allows). Continue to monitor I/os, daily standing weights.  CHF education recommended.  Recommendation regarding ischemic work-up of reduced EF as below and following successful IV diuresis.  We did discuss fluid and salt intake for a while today, as well as the importance of medication compliance and follow-up in the office.    Elevated high-sensitivity troponin  Coronary artery disease s/p CABG --No chest pain.  History of bypass surgery in 2002 with details unclear.  He has not regularly followed up with a cardiologist since that time.  HS Tn 59, 52 and flat trending.  EKG with T wave inversion in the inferior and lateral leads and as above.  Suspect supply demand ischemia in the setting of his volume overload.  However, given his reduced EF by recent echo and risk factors for CAD include previous history of alcohol and tobacco use, known history of CAD s/p CABG, hypertension, hyperlipidemia, age, male, recommend further ischemic  work-up once IV diuresed.  For now, continue current medications.  Aggressive risk factor modification recommended.  Essential hypertension --Suspect improvement in BP with ongoing gentle IV diuresis.  As above, escalation of GDMT as tolerated.  HLD --Recheck LDL with goal LDL below 70.  Recommend addition of statin for risk factor modification.  For questions or updates, please contact [facility] Please consult [email or web address] for contact info under    Signed, Lennon Alstrom, [name]  11/11/2020 2:04 PM

## 2020-11-11 NOTE — ED Notes (Signed)
Pt transported to XR.  

## 2020-11-11 NOTE — ED Notes (Signed)
Breakfast tray given. °

## 2020-11-11 NOTE — ED Notes (Signed)
Resumed care from [facility].  Pt alert , watching tv.  Pt waiting for admission.

## 2020-11-11 NOTE — ED Notes (Signed)
Family contact (historian): Chip Boer [phone]

## 2020-11-12 DIAGNOSIS — I5021 Acute systolic (congestive) heart failure: Secondary | ICD-10-CM | POA: Diagnosis not present

## 2020-11-12 LAB — BASIC METABOLIC PANEL
Anion gap: 11 (ref 5–15)
BUN: 27 mg/dL — ABNORMAL HIGH (ref 8–23)
CO2: 24 mmol/L (ref 22–32)
Calcium: 9 mg/dL (ref 8.9–10.3)
Chloride: 102 mmol/L (ref 98–111)
Creatinine, Ser: 1.56 mg/dL — ABNORMAL HIGH (ref 0.61–1.24)
GFR, Estimated: 46 mL/min — ABNORMAL LOW (ref >60)
Glucose, Bld: 94 mg/dL (ref 70–99)
Potassium: 3.5 mmol/L (ref 3.5–5.1)
Sodium: 137 mmol/L (ref 135–145)

## 2020-11-12 LAB — LIPID PANEL
Cholesterol: 110 mg/dL (ref 0–200)
HDL: 22 mg/dL — ABNORMAL LOW (ref >40)
LDL Cholesterol: 70 mg/dL (ref 0–99)
Total CHOL/HDL Ratio: 5 RATIO
Triglycerides: 91 mg/dL (ref <150)
VLDL: 18 mg/dL (ref 0–40)

## 2020-11-12 MED ORDER — GUAIFENESIN-DM 100-10 MG/5ML PO SYRP
5.0000 mL | ORAL_SOLUTION | ORAL | Status: DC | PRN
  Administered 2020-11-12: 5 mL via ORAL
  Filled 2020-11-12 (×2): qty 5

## 2020-11-12 MED ORDER — MAGNESIUM GLUCONATE 500 MG PO TABS
500.0000 mg | ORAL_TABLET | Freq: Once | ORAL | Status: AC
  Administered 2020-11-12: 500 mg via ORAL
  Filled 2020-11-12: qty 1

## 2020-11-12 MED ORDER — ATORVASTATIN CALCIUM 20 MG PO TABS
40.0000 mg | ORAL_TABLET | Freq: Every day | ORAL | Status: DC
  Administered 2020-11-13 – 2020-11-17 (×5): 40 mg via ORAL
  Filled 2020-11-12 (×5): qty 2

## 2020-11-12 MED ORDER — LOSARTAN POTASSIUM 50 MG PO TABS
50.0000 mg | ORAL_TABLET | Freq: Every day | ORAL | Status: DC
  Administered 2020-11-13: 50 mg via ORAL
  Filled 2020-11-12: qty 1

## 2020-11-12 MED ORDER — ASPIRIN EC 81 MG PO TBEC
81.0000 mg | DELAYED_RELEASE_TABLET | Freq: Every day | ORAL | Status: DC
  Administered 2020-11-12 – 2020-11-17 (×5): 81 mg via ORAL
  Filled 2020-11-12 (×5): qty 1

## 2020-11-12 MED ORDER — POTASSIUM CHLORIDE CRYS ER 20 MEQ PO TBCR
40.0000 meq | EXTENDED_RELEASE_TABLET | Freq: Once | ORAL | Status: AC
  Administered 2020-11-12: 40 meq via ORAL
  Filled 2020-11-12: qty 4

## 2020-11-12 MED ORDER — DAPAGLIFLOZIN PROPANEDIOL 5 MG PO TABS
5.0000 mg | ORAL_TABLET | Freq: Every day | ORAL | Status: DC
  Administered 2020-11-12 – 2020-11-17 (×5): 5 mg via ORAL
  Filled 2020-11-12 (×6): qty 1

## 2020-11-12 MED ORDER — GUAIFENESIN-DM 100-10 MG/5ML PO SYRP
5.0000 mL | ORAL_SOLUTION | Freq: Four times a day (QID) | ORAL | Status: DC
  Administered 2020-11-12 – 2020-11-17 (×17): 5 mL via ORAL
  Filled 2020-11-12 (×18): qty 5

## 2020-11-12 MED ORDER — ALBUTEROL SULFATE HFA 108 (90 BASE) MCG/ACT IN AERS
1.0000 | INHALATION_SPRAY | RESPIRATORY_TRACT | Status: DC | PRN
  Filled 2020-11-12: qty 6.7

## 2020-11-12 NOTE — Progress Notes (Addendum)
Progress Note  Patient Name: Benjamin Valentine Date of Encounter: 11/12/2020  [facility] Cardiologist: New - Gollan  Subjective   Feeling much better with less shortness of breath.  Still coughing some.  No chest pain, palpitations, or edema.  Inpatient Medications    Scheduled Meds:  allopurinol  300 mg Oral Daily   dapagliflozin propanediol  5 mg Oral Daily   guaiFENesin-dextromethorphan  5 mL Oral QID   hydrALAZINE  25 mg Oral Q6H   [START ON 11/13/2020] losartan  50 mg Oral Daily   metoprolol succinate  100 mg Oral Daily   simethicone  80 mg Oral QID   spironolactone  25 mg Oral Daily   Continuous Infusions:  PRN Meds: acetaminophen **OR** acetaminophen, albuterol, ondansetron **OR** ondansetron (ZOFRAN) IV   Vital Signs    Vitals:   11/12/20 0717 11/12/20 0719 11/12/20 0803 11/12/20 1012  BP: (!) 145/61  117/66 130/70  Pulse:  67 63 68  Resp:   14 16  Temp:    98 F (36.7 C)  TempSrc:    Oral  SpO2:  100% 98% 98%  Weight:      Height:        Intake/Output Summary (Last 24 hours) at 11/12/2020 1356 Last data filed at 11/12/2020 1011 Gross per 24 hour  Intake 240 ml  Output 700 ml  Net -460 ml   Last 3 Weights 11/11/2020 06/23/2020 06/16/2020  Weight (lbs) 220 lb 201 lb 8 oz 201 lb 6.4 oz  Weight (kg) 99.791 kg 91.4 kg 91.354 kg      Telemetry    Normal sinus rhythm and sinus bradycardia with rare PVC's - Personally Reviewed  ECG    No new tracing  Physical Exam   GEN: No acute distress.   Neck: No JVD Cardiac: RRR, no murmurs, rubs, or gallops.  Respiratory: Clear to auscultation bilaterally. GI: Soft, nontender, non-distended  MS: No edema; No deformity. Neuro:  Nonfocal  Psych: Normal affect   Labs    High Sensitivity Troponin:   Recent Labs  Lab 11/11/20 0215 11/11/20 0429  TROPONINIHS 59* 52*     Chemistry Recent Labs  Lab 11/11/20 0215 11/12/20 0725  NA 136 137  K 3.9 3.5  CL 103 102  CO2 24 24  GLUCOSE 123* 94  BUN 21  27*  CREATININE 1.48* 1.56*  CALCIUM 8.8* 9.0  PROT 7.8  --   ALBUMIN 4.1  --   AST 22  --   ALT 19  --   ALKPHOS 57  --   BILITOT 0.9  --   GFRNONAA 49* 46*  ANIONGAP 9 11    Lipids No results for input(s): CHOL, TRIG, HDL, LABVLDL, LDLCALC, CHOLHDL in the last 168 hours.  Hematology Recent Labs  Lab 11/11/20 0215  WBC 8.1  RBC 4.42  HGB 13.3  HCT 40.7  MCV 92.1  MCH 30.1  MCHC 32.7  RDW 15.5  PLT 344   Thyroid No results for input(s): TSH, FREET4 in the last 168 hours.  BNP Recent Labs  Lab 11/11/20 0215  BNP 616.5*    DDimer No results for input(s): DDIMER in the last 168 hours.   Radiology    DG Chest 2 View  Result Date: 11/11/2020 CLINICAL DATA:  Cough and abdominal pain EXAM: CHEST - 2 VIEW COMPARISON:  11/03/2018 FINDINGS: Moderate cardiomegaly. No pulmonary edema. Normal pleural spaces. No focal airspace consolidation. Remote median sternotomy. IMPRESSION: Cardiomegaly without pulmonary edema. Electronically Signed  By: Deatra Robinson M.D.   On: 11/11/2020 02:43   DG Abd 2 Views  Result Date: 11/11/2020 CLINICAL DATA:  Abdominal pain EXAM: ABDOMEN - 2 VIEW COMPARISON:  CTA abdomen/pelvis 09/10/2018 FINDINGS: There is mild gaseous distention of the stomach and large bowel without evidence of mechanical obstruction. There is a mild stool burden projecting over the rectum. No free intraperitoneal air is identified. Median sternotomy wires are noted. The lung bases are clear. There is no acute osseous abnormality. IMPRESSION: Mild gaseous distention of the stomach and large bowel without evidence of mechanical obstruction. Electronically Signed   By: Lesia Hausen M.D.   On: 11/11/2020 09:51   ECHOCARDIOGRAM COMPLETE  Result Date: 11/11/2020    ECHOCARDIOGRAM REPORT   Patient Name:   Benjamin Valentine Date of Exam: 11/11/2020 Medical Rec #:  [id number]      Height:       69.0 in Accession #:    [id number]     Weight:       220.0 lb Date of Birth:  [birth year]       BSA:           2.151 m Patient Age:    75 years       BP:           154/94 mmHg Patient Gender: M              HR:           74 bpm. Exam Location:  [facility] Procedure: 2D Echo, Cardiac Doppler and Color Doppler Indications:     CHF-acute diastolic I50.31  History:         Patient has no prior history of Echocardiogram examinations.                  CAD; Risk Factors:Hypertension.  Sonographer:     Cristela Blue Referring Phys:  [id number] Andris Baumann Diagnosing Phys: Arnoldo Hooker MD  Sonographer Comments: Technically challenging study due to limited acoustic windows. The only view obtainable was parasternal. IMPRESSIONS  1. Left ventricular ejection fraction, by estimation, is 25 to 30%. The left ventricle has severely decreased function. The left ventricle demonstrates global hypokinesis. The left ventricular internal cavity size was mildly to moderately dilated. Left ventricular diastolic parameters were normal.  2. Right ventricular systolic function is normal. The right ventricular size is normal.  3. The mitral valve is normal in structure. Mild mitral valve regurgitation.  4. The aortic valve is normal in structure. Aortic valve regurgitation is not visualized. FINDINGS  Left Ventricle: Left ventricular ejection fraction, by estimation, is 25 to 30%. The left ventricle has severely decreased function. The left ventricle demonstrates global hypokinesis. The left ventricular internal cavity size was mildly to moderately dilated. There is no left ventricular hypertrophy. Left ventricular diastolic parameters were normal. Right Ventricle: The right ventricular size is normal. No increase in right ventricular wall thickness. Right ventricular systolic function is normal. Left Atrium: Left atrial size was normal in size. Right Atrium: Right atrial size was normal in size. Pericardium: There is no evidence of pericardial effusion. Mitral Valve: The mitral valve is normal in structure. Mild mitral valve regurgitation. Tricuspid  Valve: The tricuspid valve is normal in structure. Tricuspid valve regurgitation is mild. Aortic Valve: The aortic valve is normal in structure. Aortic valve regurgitation is not visualized. Pulmonic Valve: The pulmonic valve was normal in structure. Pulmonic valve regurgitation is trivial. Aorta: The aortic root and ascending aorta are structurally normal,  with no evidence of dilitation. IAS/Shunts: No atrial level shunt detected by color flow Doppler.  LEFT VENTRICLE PLAX 2D LVIDd:         5.50 cm LVIDs:         5.00 cm LV PW:         1.60 cm LV IVS:        1.80 cm LVOT diam:     2.00 cm LVOT Area:     3.14 cm  LEFT ATRIUM         Index LA diam:    4.60 cm 2.14 cm/m                        PULMONIC VALVE AORTA                 PV Vmax:          0.43 m/s Ao Root diam: 3.40 cm PV Peak grad:     0.7 mmHg                       PR Jaquan Sadowsky Diast Vel: 18.32 msec                       RVOT Peak grad:   1 mmHg   SHUNTS Systemic Diam: 2.00 cm Arnoldo Hooker MD Electronically signed by Arnoldo Hooker MD Signature Date/Time: 11/11/2020/12:13:54 PM    Final     Cardiac Studies   See TTE report above  Patient Profile     75 y.o. male with history of coronary artery disease s/p bypass surgery 2002, hypertension, hyperlipidemia, osteoarthritis, gout, previous COVID-19 infection, admitted with shortness of breath found to have acute HFrEF and minimal troponin elevation.  Assessment & Plan    Acute HFrEF: Shortness of breath much improved and almost back to baseline.  Suspect there could be an element of bronchitis or other primary pulmonary pathology contributing.  Mr. Patalano appears euvolemic on exam.  Creatinine up slightly since yesterday. - Maintain net even fluid balance; defer standing diuretic therapy at this time. - Switch enalapril to losartan 50 mg daily in anticipation of transitioning to [facility] in the future. - Continue spironolactone.  Agree with addition of dapagliflozin. - Plan for [facility] with Dr. Kirke Corin  on Monday if renal function and respiratory status remain stable/improve.  Coronary artery disease and elevated troponin: Presentation not consistent with ACS.  Suspect mild Tn elevation is supply-demand mismatch.  Worsening ischemic heart disease a concern in the setting of severely reduced LVEF and history of remote CABG (~20 years ago). - Add aspirin 81 mg daily. - Continue metoprolol. - Check lipid panel and add atorvastatin for target LDL < 70.  Acute respiratory failure with hypoxia: Likely combination of acute HFrEF and possible bronchitis.  Lungs clear on exam today. - Maintain net even fluid balance. - Supportive care for possible bronchitis.  Chronic kidney disease stage 3a: Creatinine up slightly since yesterday. - Defer further diuresis. - Avoid nephrotoxic drugs.   Hypertension: - Continue metoprolol and hydralazine (may benefit from addition of isordil from HF standpoint in the future). - Switch enalapril to losartan with ultimate goal of transitioning to [facility].  Shared Decision Making/Informed Consent The risks [stroke (1 in 1000), death (1 in 1000), kidney failure [usually temporary] (1 in 500), bleeding (1 in 200), allergic reaction [possibly serious] (1 in 200)], benefits (diagnostic support and management of coronary artery disease) and alternatives of  a cardiac catheterization were discussed in detail with Mr. Clugston and he is willing to proceed.  For questions or updates, please contact [facility] Please consult [email or web address] for contact info under [facility].     Signed, Yvonne Kendall, MD  11/12/2020, 1:56 PM

## 2020-11-12 NOTE — Progress Notes (Signed)
PROGRESS NOTE    Benjamin Valentine   MGQ:[id number]  DOB: 1945-03-16  DOA: 11/11/2020 PCP: Corky Downs, MD   Brief Narrative:  Benjamin Valentine is a 75 year old male with CABG in 2002, hypertension, chronic kidney disease stage III who presents to the hospital for 3 complaints.  The first is a dry cough and shortness of breath for 2 days.  He has not had any fever chills or sweats and there is no complaint of chest pain.  The second complaint is of increased swelling of his lower extremity for 2 to 3 days.  The last complaint is bloating/distention of his abdomen.  He thought he might of been constipated even though he was having regular bowel movements.  He decided to take MiraLAX and this produced some more bowel movements but his abdominal swelling did not resolve. In the ED, work-up was essentially normal-chest x-ray revealed cardiomegaly but no pulmonary edema.  The patient was treated with a DuoNeb and IV Lasix and subsequently admitted for acute heart failure.    Subjective: He is having some congestion with his cough today. No other complaints.     Assessment & Plan:   Principal Problem:   Acute dyspnea with tachypnea and cough due to acute viral bronchitis and acute systolic CHF -Symptoms have been bad for 2 days -There has been mild wheezing on exam intermittently- he has a congested cough - there is no hypoxia - I see no indications for steroids- cont Nebs and cough suppressants PRN - He was a smoker for 30 years before quitting in his 65s - At this time I have a high suspicion for an acute viral infection causing bronchitis -Lastly, abdomen is severely distended and this is limiting him from taking full breaths - Rspiratory virus panel is negative -  2D echo reveals an EF 25-30% with severely decreased LV function and global hypokinesis - cardiology has planned for a cath on Monday- the patient has decided to stay the weekend for this to be done as inpatient.   Active  Problems:  Essential hypertension uncontrolled - Patient's blood pressure this morning is 172/118 - Continue Toprol at 100 mg daily, enalapril 20 mg daily, spironolactone 25 mg daily, Losartan 50 mg daily, Hydralazine PRN    Abdominal distention - Bowel sounds are hypoactive, abdomen is moderate to severely distended - Have ordered stat abdominal x-rays - As he has no vomiting, will allow him to continue to eat for now     Chronic gout due to renal impairment involving foot without tophus -Continue allopurinol    Stage 3a chronic kidney disease (HCC) -Baseline creatinine appears to be 1.8-1.5 -Continue to follow    Hx of CABG   Elevated troponin -Troponin elevation is likely demand ischemia secondary to high blood pressure, pulmonary issues in the setting of poor renal clearance    Time spent in minutes: 35 DVT prophylaxis:   Lovenox  Code Status: full code Family Communication:  Level of Care: Level of care: Progressive Cardiac Disposition Plan:  Status is: Observation  The patient will require care spanning > 2 midnights and should be moved to inpatient because: IV treatments appropriate due to intensity of illness or inability to take PO  Dispo: The patient is from: Home              Anticipated d/c is to: Home              Patient currently is not medically stable to d/c.  Difficult to place patient No      Consultants:  none Procedures:  none Antimicrobials:  Anti-infectives (From admission, onward)    None        Objective: Vitals:   11/12/20 0717 11/12/20 0719 11/12/20 0803 11/12/20 1012  BP: (!) 145/61  117/66 130/70  Pulse:  67 63 68  Resp:   14 16  Temp:    98 F (36.7 C)  TempSrc:    Oral  SpO2:  100% 98% 98%  Weight:      Height:        Intake/Output Summary (Last 24 hours) at 11/12/2020 1438 Last data filed at 11/12/2020 1011 Gross per 24 hour  Intake 240 ml  Output 700 ml  Net -460 ml    Filed Weights   11/11/20 0212   Weight: 99.8 kg    Examination: General exam: Appears comfortable  HEENT: PERRLA, oral mucosa moist, no sclera icterus or thrush Respiratory system: rhonchi bilaterally- Respiratory effort normal. Cardiovascular system: S1 & S2 heard, regular rate and rhythm Gastrointestinal system: Abdomen soft, non-tender, nondistended. Normal bowel sounds   Central nervous system: Alert and oriented. No focal neurological deficits. Extremities: No cyanosis, clubbing or edema Skin: No rashes or ulcers Psychiatry:  Mood & affect appropriate.      Data Reviewed: I have personally reviewed following labs and imaging studies  CBC: Recent Labs  Lab 11/11/20 0215  WBC 8.1  NEUTROABS 4.3  HGB 13.3  HCT 40.7  MCV 92.1  PLT 344    Basic Metabolic Panel: Recent Labs  Lab 11/11/20 0215 11/12/20 0725  NA 136 137  K 3.9 3.5  CL 103 102  CO2 24 24  GLUCOSE 123* 94  BUN 21 27*  CREATININE 1.48* 1.56*  CALCIUM 8.8* 9.0    GFR: Estimated Creatinine Clearance: 46.9 mL/min (A) (by C-G formula based on SCr of 1.56 mg/dL (H)). Liver Function Tests: Recent Labs  Lab 11/11/20 0215  AST 22  ALT 19  ALKPHOS 57  BILITOT 0.9  PROT 7.8  ALBUMIN 4.1    Recent Labs  Lab 11/11/20 0215  LIPASE 43    No results for input(s): AMMONIA in the last 168 hours. Coagulation Profile: No results for input(s): INR, PROTIME in the last 168 hours. Cardiac Enzymes: No results for input(s): CKTOTAL, CKMB, CKMBINDEX, TROPONINI in the last 168 hours. BNP (last 3 results) No results for input(s): PROBNP in the last 8760 hours. HbA1C: No results for input(s): HGBA1C in the last 72 hours. CBG: No results for input(s): GLUCAP in the last 168 hours. Lipid Profile: No results for input(s): CHOL, HDL, LDLCALC, TRIG, CHOLHDL, LDLDIRECT in the last 72 hours. Thyroid Function Tests: No results for input(s): TSH, T4TOTAL, FREET4, T3FREE, THYROIDAB in the last 72 hours. Anemia Panel: No results for input(s):  VITAMINB12, FOLATE, FERRITIN, TIBC, IRON, RETICCTPCT in the last 72 hours. Urine analysis:    Component Value Date/Time   COLORURINE YELLOW 11/11/2020 0339   APPEARANCEUR CLEAR 11/11/2020 0339   LABSPEC 1.010 11/11/2020 0339   PHURINE 6.0 11/11/2020 0339   GLUCOSEU NEGATIVE 11/11/2020 0339   HGBUR NEGATIVE 11/11/2020 0339   BILIRUBINUR NEGATIVE 11/11/2020 0339   KETONESUR NEGATIVE 11/11/2020 0339   PROTEINUR 30 (A) 11/11/2020 0339   NITRITE NEGATIVE 11/11/2020 0339   LEUKOCYTESUR NEGATIVE 11/11/2020 0339   Sepsis Labs: @LABRCNTIP (procalcitonin:4,lacticidven:4) ) Recent Results (from the past 240 hour(s))  Resp Panel by RT-PCR (Flu A&B, Covid) Nasopharyngeal Swab     Status: None  Collection Time: 11/11/20  3:39 AM   Specimen: Nasopharyngeal Swab; Nasopharyngeal(NP) swabs in vial transport medium  Result Value Ref Range Status   SARS Coronavirus 2 by RT PCR NEGATIVE NEGATIVE Final    Comment: (NOTE) SARS-CoV-2 target nucleic acids are NOT DETECTED.  The SARS-CoV-2 RNA is generally detectable in upper respiratory specimens during the acute phase of infection. The lowest concentration of SARS-CoV-2 viral copies this assay can detect is 138 copies/mL. A negative result does not preclude SARS-Cov-2 infection and should not be used as the sole basis for treatment or other patient management decisions. A negative result may occur with  improper specimen collection/handling, submission of specimen other than nasopharyngeal swab, presence of viral mutation(s) within the areas targeted by this assay, and inadequate number of viral copies(<138 copies/mL). A negative result must be combined with clinical observations, patient history, and epidemiological information. The expected result is Negative.  Fact Sheet for Patients:  BloggerCourse.com  Fact Sheet for Healthcare Providers:  SeriousBroker.it  This test is no t yet approved or  cleared by the [place] FDA and  has been authorized for detection and/or diagnosis of SARS-CoV-2 by FDA under an Emergency Use Authorization (EUA). This EUA will remain  in effect (meaning this test can be used) for the duration of the COVID-19 declaration under Section 564(b)(1) of the Act, 21 U.S.C.section 360bbb-3(b)(1), unless the authorization is terminated  or revoked sooner.       Influenza A by PCR NEGATIVE NEGATIVE Final   Influenza B by PCR NEGATIVE NEGATIVE Final    Comment: (NOTE) The Xpert Xpress SARS-CoV-2/FLU/RSV plus assay is intended as an aid in the diagnosis of influenza from Nasopharyngeal swab specimens and should not be used as a sole basis for treatment. Nasal washings and aspirates are unacceptable for Xpert Xpress SARS-CoV-2/FLU/RSV testing.  Fact Sheet for Patients: BloggerCourse.com  Fact Sheet for Healthcare Providers: SeriousBroker.it  This test is not yet approved or cleared by the [place] FDA and has been authorized for detection and/or diagnosis of SARS-CoV-2 by FDA under an Emergency Use Authorization (EUA). This EUA will remain in effect (meaning this test can be used) for the duration of the COVID-19 declaration under Section 564(b)(1) of the Act, 21 U.S.C. section 360bbb-3(b)(1), unless the authorization is terminated or revoked.  Performed at [facility], [address] [place], [place] [address]   Respiratory (~20 pathogens) panel by PCR     Status: None   Collection Time: 11/11/20  9:54 AM   Specimen: Nasopharyngeal Swab; Respiratory  Result Value Ref Range Status   Adenovirus NOT DETECTED NOT DETECTED Final   Coronavirus 229E NOT DETECTED NOT DETECTED Final    Comment: (NOTE) The Coronavirus on the Respiratory Panel, DOES NOT test for the novel  Coronavirus (2019 nCoV)    Coronavirus HKU1 NOT DETECTED NOT DETECTED Final   Coronavirus NL63 NOT DETECTED NOT  DETECTED Final   Coronavirus OC43 NOT DETECTED NOT DETECTED Final   Metapneumovirus NOT DETECTED NOT DETECTED Final   Rhinovirus / Enterovirus NOT DETECTED NOT DETECTED Final   Influenza A NOT DETECTED NOT DETECTED Final   Influenza B NOT DETECTED NOT DETECTED Final   Parainfluenza Virus 1 NOT DETECTED NOT DETECTED Final   Parainfluenza Virus 2 NOT DETECTED NOT DETECTED Final   Parainfluenza Virus 3 NOT DETECTED NOT DETECTED Final   Parainfluenza Virus 4 NOT DETECTED NOT DETECTED Final   Respiratory Syncytial Virus NOT DETECTED NOT DETECTED Final   Bordetella pertussis NOT DETECTED NOT  DETECTED Final   Bordetella Parapertussis NOT DETECTED NOT DETECTED Final   Chlamydophila pneumoniae NOT DETECTED NOT DETECTED Final   Mycoplasma pneumoniae NOT DETECTED NOT DETECTED Final    Comment: Performed at [facility], [address], [place], [place] [address]         Radiology Studies: DG Chest 2 View  Result Date: 11/11/2020 CLINICAL DATA:  Cough and abdominal pain EXAM: CHEST - 2 VIEW COMPARISON:  11/03/2018 FINDINGS: Moderate cardiomegaly. No pulmonary edema. Normal pleural spaces. No focal airspace consolidation. Remote median sternotomy. IMPRESSION: Cardiomegaly without pulmonary edema. Electronically Signed   By: Deatra Robinson M.D.   On: 11/11/2020 02:43   DG Abd 2 Views  Result Date: 11/11/2020 CLINICAL DATA:  Abdominal pain EXAM: ABDOMEN - 2 VIEW COMPARISON:  CTA abdomen/pelvis 09/10/2018 FINDINGS: There is mild gaseous distention of the stomach and large bowel without evidence of mechanical obstruction. There is a mild stool burden projecting over the rectum. No free intraperitoneal air is identified. Median sternotomy wires are noted. The lung bases are clear. There is no acute osseous abnormality. IMPRESSION: Mild gaseous distention of the stomach and large bowel without evidence of mechanical obstruction. Electronically Signed   By: Lesia Hausen M.D.   On: 11/11/2020 09:51    ECHOCARDIOGRAM COMPLETE  Result Date: 11/11/2020    ECHOCARDIOGRAM REPORT   Patient Name:   SANTIEL TOPPER Panning Date of Exam: 11/11/2020 Medical Rec #:  [id number]      Height:       69.0 in Accession #:    [id number]     Weight:       220.0 lb Date of Birth:  1945-08-04       BSA:          2.151 m Patient Age:    75 years       BP:           154/94 mmHg Patient Gender: M              HR:           74 bpm. Exam Location:  [facility] Procedure: 2D Echo, Cardiac Doppler and Color Doppler Indications:     CHF-acute diastolic I50.31  History:         Patient has no prior history of Echocardiogram examinations.                  CAD; Risk Factors:Hypertension.  Sonographer:     Cristela Blue Referring Phys:  [id number] Andris Baumann Diagnosing Phys: Arnoldo Hooker MD  Sonographer Comments: Technically challenging study due to limited acoustic windows. The only view obtainable was parasternal. IMPRESSIONS  1. Left ventricular ejection fraction, by estimation, is 25 to 30%. The left ventricle has severely decreased function. The left ventricle demonstrates global hypokinesis. The left ventricular internal cavity size was mildly to moderately dilated. Left ventricular diastolic parameters were normal.  2. Right ventricular systolic function is normal. The right ventricular size is normal.  3. The mitral valve is normal in structure. Mild mitral valve regurgitation.  4. The aortic valve is normal in structure. Aortic valve regurgitation is not visualized. FINDINGS  Left Ventricle: Left ventricular ejection fraction, by estimation, is 25 to 30%. The left ventricle has severely decreased function. The left ventricle demonstrates global hypokinesis. The left ventricular internal cavity size was mildly to moderately dilated. There is no left ventricular hypertrophy. Left ventricular diastolic parameters were normal. Right Ventricle: The right ventricular size is normal. No increase  in right ventricular wall thickness. Right ventricular  systolic function is normal. Left Atrium: Left atrial size was normal in size. Right Atrium: Right atrial size was normal in size. Pericardium: There is no evidence of pericardial effusion. Mitral Valve: The mitral valve is normal in structure. Mild mitral valve regurgitation. Tricuspid Valve: The tricuspid valve is normal in structure. Tricuspid valve regurgitation is mild. Aortic Valve: The aortic valve is normal in structure. Aortic valve regurgitation is not visualized. Pulmonic Valve: The pulmonic valve was normal in structure. Pulmonic valve regurgitation is trivial. Aorta: The aortic root and ascending aorta are structurally normal, with no evidence of dilitation. IAS/Shunts: No atrial level shunt detected by color flow Doppler.  LEFT VENTRICLE PLAX 2D LVIDd:         5.50 cm LVIDs:         5.00 cm LV PW:         1.60 cm LV IVS:        1.80 cm LVOT diam:     2.00 cm LVOT Area:     3.14 cm  LEFT ATRIUM         Index LA diam:    4.60 cm 2.14 cm/m                        PULMONIC VALVE AORTA                 PV Vmax:          0.43 m/s Ao Root diam: 3.40 cm PV Peak grad:     0.7 mmHg                       PR End Diast Vel: 18.32 msec                       RVOT Peak grad:   1 mmHg   SHUNTS Systemic Diam: 2.00 cm Arnoldo Hooker MD Electronically signed by Arnoldo Hooker MD Signature Date/Time: 11/11/2020/12:13:54 PM    Final       Scheduled Meds:  allopurinol  300 mg Oral Daily   aspirin EC  81 mg Oral Daily   [START ON 11/13/2020] atorvastatin  40 mg Oral Daily   dapagliflozin propanediol  5 mg Oral Daily   guaiFENesin-dextromethorphan  5 mL Oral QID   hydrALAZINE  25 mg Oral Q6H   [START ON 11/13/2020] losartan  50 mg Oral Daily   metoprolol succinate  100 mg Oral Daily   simethicone  80 mg Oral QID   spironolactone  25 mg Oral Daily   Continuous Infusions:   LOS: 1 day      Calvert Cantor, MD [facility] Pager: [email or web address] 11/12/2020, 2:38 PM

## 2020-11-12 NOTE — ED Notes (Signed)
Pt ambulated to BR with steady gait.

## 2020-11-12 NOTE — ED Notes (Signed)
Informed RN bed assigned 

## 2020-11-12 NOTE — ED Notes (Signed)
Breakfast tray delivered to pt at this time.  

## 2020-11-13 DIAGNOSIS — R0600 Dyspnea, unspecified: Secondary | ICD-10-CM | POA: Diagnosis not present

## 2020-11-13 LAB — BASIC METABOLIC PANEL
Anion gap: 9 (ref 5–15)
BUN: 33 mg/dL — ABNORMAL HIGH (ref 8–23)
CO2: 24 mmol/L (ref 22–32)
Calcium: 9.5 mg/dL (ref 8.9–10.3)
Chloride: 102 mmol/L (ref 98–111)
Creatinine, Ser: 1.76 mg/dL — ABNORMAL HIGH (ref 0.61–1.24)
GFR, Estimated: 40 mL/min — ABNORMAL LOW (ref >60)
Glucose, Bld: 125 mg/dL — ABNORMAL HIGH (ref 70–99)
Potassium: 4.4 mmol/L (ref 3.5–5.1)
Sodium: 135 mmol/L (ref 135–145)

## 2020-11-13 LAB — GLUCOSE, CAPILLARY: Glucose-Capillary: 142 mg/dL — ABNORMAL HIGH (ref 70–99)

## 2020-11-13 MED ORDER — MELATONIN 5 MG PO TABS
5.0000 mg | ORAL_TABLET | Freq: Every day | ORAL | Status: DC
  Administered 2020-11-13 – 2020-11-16 (×5): 5 mg via ORAL
  Filled 2020-11-13 (×5): qty 1

## 2020-11-13 MED ORDER — LOSARTAN POTASSIUM 25 MG PO TABS
25.0000 mg | ORAL_TABLET | Freq: Every day | ORAL | Status: DC
  Administered 2020-11-14 – 2020-11-16 (×3): 25 mg via ORAL
  Filled 2020-11-13 (×3): qty 1

## 2020-11-13 NOTE — Progress Notes (Signed)
PROGRESS NOTE    ADNAN VANVOORHIS   QTM:[id number]  DOB: February 18, 1945  DOA: 11/11/2020 PCP: Corky Downs, MD   Brief Narrative:  Benjamin Valentine is a 75 year old male with CABG in 2002, hypertension, chronic kidney disease stage III who presents to the hospital for 3 complaints.  The first is a dry cough and shortness of breath for 2 days.  He has not had any fever chills or sweats and there is no complaint of chest pain.  The second complaint is of increased swelling of his lower extremity for 2 to 3 days.  The last complaint is bloating/distention of his abdomen.  He thought he might of been constipated even though he was having regular bowel movements.  He decided to take MiraLAX and this produced some more bowel movements but his abdominal swelling did not resolve. In the ED, work-up was essentially normal-chest x-ray revealed cardiomegaly but no pulmonary edema.  The patient was treated with a DuoNeb and IV Lasix and subsequently admitted for acute heart failure.    Subjective: No new complaints today.     Assessment & Plan:   Principal Problem:   Acute dyspnea with tachypnea and cough due to acute viral bronchitis and acute systolic CHF -Symptoms have been severe for 2 days prior to admission -There has been mild wheezing on exam intermittently- he has a congested cough - there is no hypoxia - I see no indications for steroids- cont Nebs and cough suppressants PRN - He was a smoker for 30 years before quitting in his 53s - At this time I have a high suspicion for an acute viral infection causing bronchitis - Rspiratory virus panel is negative -  2D echo reveals an EF 25-30% with severely decreased LV function and global hypokinesis - cardiology has planned for a cath on Monday- the patient has decided to stay the weekend for this to be done as inpatient. - the patient received diuretics and  is now euvolemic  Active Problems:  Essential hypertension uncontrolled - Patient's  blood pressure this morning is 172/118 - Continue Toprol at 100 mg daily, enalapril 20 mg daily, spironolactone 25 mg daily, Losartan 50 mg daily, Hydralazine PRN - Losartan decreased to 25 mg today by cardiology  AKI with CKD stage 3a -Baseline creatinine appears to be 1.8-1.5 - Cr rising from 1.18 to 1.76 now - ARB dose reduced today- Aldactone has been held - recheck tomorrow   Abdominal distention - resolved  Chronic gout due to renal impairment involving foot without tophus -Continue allopurinol    Hx of CABG   Elevated troponin -Troponin elevation is likely demand ischemia secondary to high blood pressure, pulmonary issues in the setting of poor renal clearance    Time spent in minutes: 35 DVT prophylaxis:   Lovenox  Code Status: full code Family Communication:  Level of Care: Level of care: Progressive Cardiac Disposition Plan:  Status is: Observation  The patient will require care spanning > 2 midnights and should be moved to inpatient because: IV treatments appropriate due to intensity of illness or inability to take PO  Dispo: The patient is from: Home              Anticipated d/c is to: Home              Patient currently is not medically stable to d/c.   Difficult to place patient No      Consultants:  none Procedures:  none Antimicrobials:  Anti-infectives (From admission,  onward)    None        Objective: Vitals:   11/13/20 0500 11/13/20 0627 11/13/20 0754 11/13/20 1142  BP:  136/85 (!) 110/59 (!) 110/58  Pulse:  (!) 58 (!) 53 62  Resp:   18 17  Temp:   98.2 F (36.8 C) 98.1 F (36.7 C)  TempSrc:      SpO2:   99% 98%  Weight: 92 kg     Height:        Intake/Output Summary (Last 24 hours) at 11/13/2020 1420 Last data filed at 11/13/2020 1401 Gross per 24 hour  Intake 660 ml  Output 940 ml  Net -280 ml    Filed Weights   11/12/20 1516 11/13/20 0330 11/13/20 0500  Weight: 92.4 kg 92.1 kg 92 kg    Examination: General exam:  Appears comfortable  HEENT: PERRLA, oral mucosa moist, no sclera icterus or thrush Respiratory system: Clear to auscultation. Respiratory effort normal. Cardiovascular system: S1 & S2 heard, regular rate and rhythm Gastrointestinal system: Abdomen soft, non-tender, nondistended. Normal bowel sounds   Central nervous system: Alert and oriented. No focal neurological deficits. Extremities: No cyanosis, clubbing or edema Skin: No rashes or ulcers Psychiatry:  Mood & affect appropriate.      Data Reviewed: I have personally reviewed following labs and imaging studies  CBC: Recent Labs  Lab 11/11/20 0215  WBC 8.1  NEUTROABS 4.3  HGB 13.3  HCT 40.7  MCV 92.1  PLT 344    Basic Metabolic Panel: Recent Labs  Lab 11/11/20 0215 11/12/20 0725 11/13/20 1324  NA 136 137 135  K 3.9 3.5 4.4  CL 103 102 102  CO2 24 24 24   GLUCOSE 123* 94 125*  BUN 21 27* 33*  CREATININE 1.48* 1.56* 1.76*  CALCIUM 8.8* 9.0 9.5    GFR: Estimated Creatinine Clearance: 40 mL/min (A) (by C-G formula based on SCr of 1.76 mg/dL (H)). Liver Function Tests: Recent Labs  Lab 11/11/20 0215  AST 22  ALT 19  ALKPHOS 57  BILITOT 0.9  PROT 7.8  ALBUMIN 4.1    Recent Labs  Lab 11/11/20 0215  LIPASE 43    No results for input(s): AMMONIA in the last 168 hours. Coagulation Profile: No results for input(s): INR, PROTIME in the last 168 hours. Cardiac Enzymes: No results for input(s): CKTOTAL, CKMB, CKMBINDEX, TROPONINI in the last 168 hours. BNP (last 3 results) No results for input(s): PROBNP in the last 8760 hours. HbA1C: No results for input(s): HGBA1C in the last 72 hours. CBG: No results for input(s): GLUCAP in the last 168 hours. Lipid Profile: Recent Labs    11/12/20 0725  CHOL 110  HDL 22*  LDLCALC 70  TRIG 91  CHOLHDL 5.0   Thyroid Function Tests: No results for input(s): TSH, T4TOTAL, FREET4, T3FREE, THYROIDAB in the last 72 hours. Anemia Panel: No results for input(s):  VITAMINB12, FOLATE, FERRITIN, TIBC, IRON, RETICCTPCT in the last 72 hours. Urine analysis:    Component Value Date/Time   COLORURINE YELLOW 11/11/2020 0339   APPEARANCEUR CLEAR 11/11/2020 0339   LABSPEC 1.010 11/11/2020 0339   PHURINE 6.0 11/11/2020 0339   GLUCOSEU NEGATIVE 11/11/2020 0339   HGBUR NEGATIVE 11/11/2020 0339   BILIRUBINUR NEGATIVE 11/11/2020 0339   KETONESUR NEGATIVE 11/11/2020 0339   PROTEINUR 30 (A) 11/11/2020 0339   NITRITE NEGATIVE 11/11/2020 0339   LEUKOCYTESUR NEGATIVE 11/11/2020 0339   Sepsis Labs: @LABRCNTIP (procalcitonin:4,lacticidven:4) ) Recent Results (from the past 240 hour(s))  Resp Panel by  RT-PCR (Flu A&B, Covid) Nasopharyngeal Swab     Status: None   Collection Time: 11/11/20  3:39 AM   Specimen: Nasopharyngeal Swab; Nasopharyngeal(NP) swabs in vial transport medium  Result Value Ref Range Status   SARS Coronavirus 2 by RT PCR NEGATIVE NEGATIVE Final    Comment: (NOTE) SARS-CoV-2 target nucleic acids are NOT DETECTED.  The SARS-CoV-2 RNA is generally detectable in upper respiratory specimens during the acute phase of infection. The lowest concentration of SARS-CoV-2 viral copies this assay can detect is 138 copies/mL. A negative result does not preclude SARS-Cov-2 infection and should not be used as the sole basis for treatment or other patient management decisions. A negative result may occur with  improper specimen collection/handling, submission of specimen other than nasopharyngeal swab, presence of viral mutation(s) within the areas targeted by this assay, and inadequate number of viral copies(<138 copies/mL). A negative result must be combined with clinical observations, patient history, and epidemiological information. The expected result is Negative.  Fact Sheet for Patients:  BloggerCourse.com  Fact Sheet for Healthcare Providers:  SeriousBroker.it  This test is no t yet approved or  cleared by the [place] FDA and  has been authorized for detection and/or diagnosis of SARS-CoV-2 by FDA under an Emergency Use Authorization (EUA). This EUA will remain  in effect (meaning this test can be used) for the duration of the COVID-19 declaration under Section 564(b)(1) of the Act, 21 U.S.C.section 360bbb-3(b)(1), unless the authorization is terminated  or revoked sooner.       Influenza A by PCR NEGATIVE NEGATIVE Final   Influenza B by PCR NEGATIVE NEGATIVE Final    Comment: (NOTE) The Xpert Xpress SARS-CoV-2/FLU/RSV plus assay is intended as an aid in the diagnosis of influenza from Nasopharyngeal swab specimens and should not be used as a sole basis for treatment. Nasal washings and aspirates are unacceptable for Xpert Xpress SARS-CoV-2/FLU/RSV testing.  Fact Sheet for Patients: BloggerCourse.com  Fact Sheet for Healthcare Providers: SeriousBroker.it  This test is not yet approved or cleared by the [place] FDA and has been authorized for detection and/or diagnosis of SARS-CoV-2 by FDA under an Emergency Use Authorization (EUA). This EUA will remain in effect (meaning this test can be used) for the duration of the COVID-19 declaration under Section 564(b)(1) of the Act, 21 U.S.C. section 360bbb-3(b)(1), unless the authorization is terminated or revoked.  Performed at [facility], [address] [place], [place] [address]   Respiratory (~20 pathogens) panel by PCR     Status: None   Collection Time: 11/11/20  9:54 AM   Specimen: Nasopharyngeal Swab; Respiratory  Result Value Ref Range Status   Adenovirus NOT DETECTED NOT DETECTED Final   Coronavirus 229E NOT DETECTED NOT DETECTED Final    Comment: (NOTE) The Coronavirus on the Respiratory Panel, DOES NOT test for the novel  Coronavirus (2019 nCoV)    Coronavirus HKU1 NOT DETECTED NOT DETECTED Final   Coronavirus NL63 NOT DETECTED NOT  DETECTED Final   Coronavirus OC43 NOT DETECTED NOT DETECTED Final   Metapneumovirus NOT DETECTED NOT DETECTED Final   Rhinovirus / Enterovirus NOT DETECTED NOT DETECTED Final   Influenza A NOT DETECTED NOT DETECTED Final   Influenza B NOT DETECTED NOT DETECTED Final   Parainfluenza Virus 1 NOT DETECTED NOT DETECTED Final   Parainfluenza Virus 2 NOT DETECTED NOT DETECTED Final   Parainfluenza Virus 3 NOT DETECTED NOT DETECTED Final   Parainfluenza Virus 4 NOT DETECTED NOT DETECTED Final   Respiratory  Syncytial Virus NOT DETECTED NOT DETECTED Final   Bordetella pertussis NOT DETECTED NOT DETECTED Final   Bordetella Parapertussis NOT DETECTED NOT DETECTED Final   Chlamydophila pneumoniae NOT DETECTED NOT DETECTED Final   Mycoplasma pneumoniae NOT DETECTED NOT DETECTED Final    Comment: Performed at [facility], [address], [place], [place] [address]         Radiology Studies: No results found.    Scheduled Meds:  allopurinol  300 mg Oral Daily   aspirin EC  81 mg Oral Daily   atorvastatin  40 mg Oral Daily   dapagliflozin propanediol  5 mg Oral Daily   guaiFENesin-dextromethorphan  5 mL Oral QID   hydrALAZINE  25 mg Oral Q6H   [START ON 11/14/2020] losartan  25 mg Oral Daily   melatonin  5 mg Oral QHS   metoprolol succinate  100 mg Oral Daily   simethicone  80 mg Oral QID   spironolactone  25 mg Oral Daily   Continuous Infusions:   LOS: 2 days      Calvert Cantor, MD [facility] Pager: [email or web address] 11/13/2020, 2:20 PM

## 2020-11-13 NOTE — Progress Notes (Addendum)
Progress Note  Patient Name: Benjamin Valentine Date of Encounter: 11/13/2020  Primary Cardiologist: [facility], Dr. Mariah Milling  Subjective   Reports significantly improved since presenting to the emergency room.  Reviewed recommendation for catheterization this upcoming week and with patient's understanding.  Reports significantly improved breathing status.  He still has episodes where he feels as if he cannot catch his breath, especially when lifting something.  He walked around the telemetry unit earlier this morning with some dyspnea reported.  No chest pain.  He asks about possibly fishing and racecar driving in the future with recommendation that he avoid racecar driving.  Inpatient Medications    Scheduled Meds:  allopurinol  300 mg Oral Daily   aspirin EC  81 mg Oral Daily   atorvastatin  40 mg Oral Daily   dapagliflozin propanediol  5 mg Oral Daily   guaiFENesin-dextromethorphan  5 mL Oral QID   hydrALAZINE  25 mg Oral Q6H   losartan  50 mg Oral Daily   melatonin  5 mg Oral QHS   metoprolol succinate  100 mg Oral Daily   simethicone  80 mg Oral QID   spironolactone  25 mg Oral Daily   Continuous Infusions:  PRN Meds: acetaminophen **OR** acetaminophen, albuterol, albuterol, ondansetron **OR** ondansetron (ZOFRAN) IV   Vital Signs    Vitals:   11/13/20 0443 11/13/20 0500 11/13/20 0627 11/13/20 0754  BP: (!) 158/73  136/85 (!) 110/59  Pulse: 76  (!) 58 (!) 53  Resp: 20   18  Temp: 98.4 F (36.9 C)   98.2 F (36.8 C)  TempSrc:      SpO2: 95%   99%  Weight:  92 kg    Height:        Intake/Output Summary (Last 24 hours) at 11/13/2020 1036 Last data filed at 11/13/2020 1021 Gross per 24 hour  Intake 420 ml  Output 940 ml  Net -520 ml   Last 3 Weights 11/13/2020 11/13/2020 11/12/2020  Weight (lbs) 202 lb 12.8 oz 203 lb 1.6 oz 203 lb 11.2 oz  Weight (kg) 91.989 kg 92.126 kg 92.398 kg      Telemetry    Sinus rhythm with PACs and PVCs - Personally Reviewed  ECG     No new tracings- Personally Reviewed  Physical Exam   GEN: Elderly male, sitting on the edge of bed Neck: No JVD Cardiac: RRR, no murmurs, rubs, or gallops.  Respiratory: Clear to auscultation bilaterally. GI: Soft, nontender, non-distended  MS: No edema; No deformity. Neuro:  Nonfocal  Psych: Normal affect   Labs    High Sensitivity Troponin:   Recent Labs  Lab 11/11/20 0215 11/11/20 0429  TROPONINIHS 59* 52*      Chemistry Recent Labs  Lab 11/11/20 0215 11/12/20 0725  NA 136 137  K 3.9 3.5  CL 103 102  CO2 24 24  GLUCOSE 123* 94  BUN 21 27*  CREATININE 1.48* 1.56*  CALCIUM 8.8* 9.0  PROT 7.8  --   ALBUMIN 4.1  --   AST 22  --   ALT 19  --   ALKPHOS 57  --   BILITOT 0.9  --   GFRNONAA 49* 46*  ANIONGAP 9 11     Hematology Recent Labs  Lab 11/11/20 0215  WBC 8.1  RBC 4.42  HGB 13.3  HCT 40.7  MCV 92.1  MCH 30.1  MCHC 32.7  RDW 15.5  PLT 344    BNP Recent Labs  Lab 11/11/20  0215  BNP 616.5*     DDimer No results for input(s): DDIMER in the last 168 hours.   Radiology    No results found.  Cardiac Studies   Echo 11/11/2020  1. Left ventricular ejection fraction, by estimation, is 25 to 30%. The  left ventricle has severely decreased function. The left ventricle  demonstrates global hypokinesis. The left ventricular internal cavity size  was mildly to moderately dilated. Left  ventricular diastolic parameters were normal.   2. Right ventricular systolic function is normal. The right ventricular  size is normal.   3. The mitral valve is normal in structure. Mild mitral valve  regurgitation.   4. The aortic valve is normal in structure. Aortic valve regurgitation is  not visualized.   Patient Profile     75 y.o. male  with a hx of coronary artery disease s/p bypass surgery 2002, hypertension, hyperlipidemia, osteoarthritis, gout, previous COVID-19 infection, and who is being seen today for the evaluation of elevated troponin  and suspected heart failure at the request of Dr. Butler Denmark.  Assessment & Plan    Acute on chronic HFrEF --Presented with acute onset shortness of breath, significantly improved since admission.  As previously noted, an element of bronchitis could be contributing.  EF 25 to 30%, LV global hypokinesis.  Appears euvolemic on exam today. Continue to defer standing diuretic at this time. Cr continues to rise overnight from 9/29-9/30 but now BMET yet this AM. Will order BMET. Cr 1.48  1.56 and BUN 24. Suspect bump in the setting of recent escalation of GDMT.  Continue to monitor closely. Decreased losartan 50 mg daily to losartan 25mg  daily for now, given bump in Cr and softer BP. Hopefully, we will be able to transition to [facility] in the future.  As above, will order BMET. Continue spironolactone.   Continue dapagliflozin.   CHF education Plan for [facility] with Dr. [facility] on Monday 10/3 pending Cr this weekend and if breathing stable.  Reviewed this plan with patient understanding and agreement.  Elevated high-sensitivity troponin  Coronary artery disease s/p CABG --No chest pain.  History of bypass surgery in 2002.  HS Tn 59, 52 and flat trending.  EKG with T wave inversion in the inferior and lateral leads and as above.  Suspect supply demand mismatch in the setting of his volume overload.  However, given his reduced EF by recent echo and risk factors for CAD include previous history of alcohol and tobacco use, known history of CAD s/p CABG, hypertension, hyperlipidemia, age, male, recommend further ischemic work-up next week, as he has been successfully diuresed.  Continue ASA 81 mg daily, metoprolol. Continue statin. Plan for [facility] with Dr. [facility] on 10/3 if Cr allows. As above, will order BMET.   Hypokalemia  --Replete with goal 4.0. Ordered BMET.  Essential hypertension -- Continue current metoprolol, hydralazine, spironolactone, diuresis, and reduced dose losartan 25mg  daily given bump in Cr  though no BMET yet today. Ordered BMET.  HLD -- LDL 70 with goal LDL below 70.  Continue statin.  For questions or updates, please contact [facility] Please consult [email or web address] for contact info under        Signed, 12/3, [name]  11/13/2020, 10:36 AM      Congestive heart failure-acute-systolic  Coronary artery disease with prior bypass surgery  Ischemic cardiomyopathy EF 30-35%  Hypertension  Renal insufficiency grade 3  BP better-- Euvolemic    For cath Monday   For cath

## 2020-11-14 DIAGNOSIS — R0600 Dyspnea, unspecified: Secondary | ICD-10-CM | POA: Diagnosis not present

## 2020-11-14 LAB — RENAL FUNCTION PANEL
Albumin: 3.9 g/dL (ref 3.5–5.0)
Anion gap: 6 (ref 5–15)
BUN: 33 mg/dL — ABNORMAL HIGH (ref 8–23)
CO2: 25 mmol/L (ref 22–32)
Calcium: 9.6 mg/dL (ref 8.9–10.3)
Chloride: 103 mmol/L (ref 98–111)
Creatinine, Ser: 1.67 mg/dL — ABNORMAL HIGH (ref 0.61–1.24)
GFR, Estimated: 42 mL/min — ABNORMAL LOW (ref >60)
Glucose, Bld: 102 mg/dL — ABNORMAL HIGH (ref 70–99)
Phosphorus: 5.2 mg/dL — ABNORMAL HIGH (ref 2.5–4.6)
Potassium: 4.2 mmol/L (ref 3.5–5.1)
Sodium: 134 mmol/L — ABNORMAL LOW (ref 135–145)

## 2020-11-14 MED ORDER — ASPIRIN 81 MG PO CHEW
81.0000 mg | CHEWABLE_TABLET | ORAL | Status: AC
  Administered 2020-11-15: 81 mg via ORAL
  Filled 2020-11-14: qty 1

## 2020-11-14 MED ORDER — METOPROLOL SUCCINATE ER 50 MG PO TB24: 50.0000 mg | ORAL_TABLET | Freq: Every day | ORAL | Status: DC

## 2020-11-14 MED ORDER — SODIUM CHLORIDE 0.9 % IV SOLN: INTRAVENOUS | Status: DC

## 2020-11-14 MED ORDER — SODIUM CHLORIDE 0.9% FLUSH
3.0000 mL | Freq: Two times a day (BID) | INTRAVENOUS | Status: DC
  Administered 2020-11-15: 3 mL via INTRAVENOUS

## 2020-11-14 MED ORDER — SODIUM CHLORIDE 0.9% FLUSH: 3.0000 mL | INTRAVENOUS | Status: DC | PRN

## 2020-11-14 MED ORDER — SODIUM CHLORIDE 0.9 % IV SOLN: 250.0000 mL | INTRAVENOUS | Status: DC | PRN

## 2020-11-14 NOTE — Progress Notes (Signed)
Progress Note  Patient Name: Benjamin Valentine Date of Encounter: 11/14/2020  Primary Cardiologist: [facility], Dr. Mariah Milling  Patient Profile     75 y.o. male  with a hx of coronary artery disease s/p bypass surgery 2002, poorly controlled hypertension, mild renal insufficiency hyperlipidemia, osteoarthritis, gout, previous COVID-19 infection, and seen for the evaluation of elevated troponin and suspected heart failure   Troponin suspected demand  Echo EF 25-30%-global Cr  Cath planned   Subjective   No chest pain or shortness of breath  Inpatient Medications    Scheduled Meds:  allopurinol  300 mg Oral Daily   aspirin EC  81 mg Oral Daily   atorvastatin  40 mg Oral Daily   dapagliflozin propanediol  5 mg Oral Daily   guaiFENesin-dextromethorphan  5 mL Oral QID   hydrALAZINE  25 mg Oral Q6H   losartan  25 mg Oral Daily   melatonin  5 mg Oral QHS   metoprolol succinate  100 mg Oral Daily   simethicone  80 mg Oral QID   Continuous Infusions:  PRN Meds: acetaminophen **OR** acetaminophen, albuterol, albuterol, ondansetron **OR** ondansetron (ZOFRAN) IV   Vital Signs    Vitals:   11/13/20 2256 11/14/20 0507 11/14/20 0507 11/14/20 0756  BP: (!) 143/85 (!) 142/82 (!) 142/82 138/68  Pulse: (!) 57 63 63 61  Resp:  19  18  Temp:  97.7 F (36.5 C)  98.1 F (36.7 C)  TempSrc:      SpO2:  97% 97% 99%  Weight:   91.8 kg   Height:        Intake/Output Summary (Last 24 hours) at 11/14/2020 0922 Last data filed at 11/14/2020 0758 Gross per 24 hour  Intake 660 ml  Output 700 ml  Net -40 ml    Last 3 Weights 11/14/2020 11/13/2020 11/13/2020  Weight (lbs) 202 lb 4.8 oz 202 lb 12.8 oz 203 lb 1.6 oz  Weight (kg) 91.763 kg 91.989 kg 92.126 kg      Telemetry    Sinus bradycardia rates 40--70s - Personally Reviewed  ECG    No new tracings- Personally Reviewed  Physical Exam   Well developed and nourished in no acute distress HENT normal Neck supple with JVP-  flat    Clear Regular rate and rhythm, no murmurs or gallops Abd-soft with active BS No Clubbing cyanosis edema Skin-warm and dry A & Oriented  Grossly normal sensory and motor function     Labs    High Sensitivity Troponin:   Recent Labs  Lab 11/11/20 0215 11/11/20 0429  TROPONINIHS 59* 52*       Chemistry Recent Labs  Lab 11/11/20 0215 11/12/20 0725 11/13/20 1324 11/14/20 0533  NA 136 137 135 134*  K 3.9 3.5 4.4 4.2  CL 103 102 102 103  CO2 24 24 24 25   GLUCOSE 123* 94 125* 102*  BUN 21 27* 33* 33*  CREATININE 1.48* 1.56* 1.76* 1.67*  CALCIUM 8.8* 9.0 9.5 9.6  PROT 7.8  --   --   --   ALBUMIN 4.1  --   --  3.9  AST 22  --   --   --   ALT 19  --   --   --   ALKPHOS 57  --   --   --   BILITOT 0.9  --   --   --   GFRNONAA 49* 46* 40* 42*  ANIONGAP 9 11 9  6  Hematology Recent Labs  Lab 11/11/20 0215  WBC 8.1  RBC 4.42  HGB 13.3  HCT 40.7  MCV 92.1  MCH 30.1  MCHC 32.7  RDW 15.5  PLT 344     BNP Recent Labs  Lab 11/11/20 0215  BNP 616.5*      DDimer No results for input(s): DDIMER in the last 168 hours.   Radiology    No results found.  Cardiac Studies   Echo 11/11/2020  1. Left ventricular ejection fraction, by estimation, is 25 to 30%. The  left ventricle has severely decreased function. The left ventricle  demonstrates global hypokinesis. The left ventricular internal cavity size  was mildly to moderately dilated. Left  ventricular diastolic parameters were normal.   2. Right ventricular systolic function is normal. The right ventricular  size is normal.   3. The mitral valve is normal in structure. Mild mitral valve  regurgitation.   4. The aortic valve is normal in structure. Aortic valve regurgitation is  not visualized.     Assessment & Plan  Congestive heart failure-acute-systolic  Coronary artery disease with prior bypass surgery  Ischemic cardiomyopathy EF 30-35%  Hypertension  Renal insufficiency grade 3     Sinus bradycardia   Renal insufficiency stable  anticipate cath in am   BP reasonably controlled-for now  Bradycardia is impressive  will decrease metop 100>>50 and will need to consider alternative HTN meds and may need further decrease in metop  Questions answered         Signed, Sherryl Manges, MD  11/14/2020, 9:22 AM

## 2020-11-14 NOTE — Progress Notes (Addendum)
PROGRESS NOTE    Benjamin Valentine   XBM:[id number]  DOB: 1946/01/14  DOA: 11/11/2020 PCP: Corky Downs, MD   Brief Narrative:  Benjamin Valentine is a 75 year old male with CABG in 2002, hypertension, chronic kidney disease stage III who presents to the hospital for 3 complaints.  The first is a dry cough and shortness of breath for 2 days.  He has not had any fever chills or sweats and there is no complaint of chest pain.  The second complaint is of increased swelling of his lower extremity for 2 to 3 days.  The last complaint is bloating/distention of his abdomen.  He thought he might of been constipated even though he was having regular bowel movements.  He decided to take MiraLAX and this produced some more bowel movements but his abdominal swelling did not resolve. In the ED, work-up was essentially normal-chest x-ray revealed cardiomegaly but no pulmonary edema.  The patient was treated with a DuoNeb and IV Lasix and subsequently admitted for acute heart failure.    Subjective: He has no complaints. He was able to ambulate in the hall yesterday and plans to again today. His dyspnea is mild when ambulating. Cough is improving.     Assessment & Plan:   Principal Problem:   Acute dyspnea with tachypnea and cough due to acute viral bronchitis and acute systolic CHF -Symptoms have been severe for 2 days prior to admission -There has been mild wheezing on exam intermittently- he has a congested cough - there is no hypoxia - I see no indications for steroids- cont Nebs and cough suppressants PRN - He was a smoker for 30 years before quitting in his 69s - At this time I have a high suspicion for an acute viral infection causing bronchitis - Rspiratory virus panel is negative -  2D echo reveals an EF 25-30% with severely decreased LV function and global hypokinesis - cardiology has planned for a cath on Monday- the patient has decided to stay the weekend for this to be done as inpatient. -  given diuretics and now euvolemic- diuretics on hold for now to ensure renal function is stable prior to cath tomorrow  Active Problems:  Essential hypertension uncontrolled - Patient's blood pressure this morning is 172/118 - Continue Toprol at 100 mg daily, enalapril 20 mg daily, spironolactone 25 mg daily, Losartan 50 mg daily, Hydralazine PRN - Losartan decreased to 25 mg today by cardiology  Bradycardia - Toprol dose reduced to 50 mg today by cardiology- He will receive the lower dose tomorrow  AKI with CKD stage 3a -Baseline creatinine appears to be 1.8-1.5 - Cr rising from 1.18 to 1.76 now - 10/1> ARB dose reduced - Aldactone Valentine - CR down to 1.67 today  Abdominal distention - resolved  Chronic gout due to renal impairment involving 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   Time spent in minutes: 35 DVT prophylaxis:   Lovenox  Code Status: full code Family Communication:  Level of Care: Level of care: Progressive Cardiac Disposition Plan:  Status is: Observation  The patient will require care spanning > 2 midnights and should be moved to inpatient because: IV treatments appropriate due to intensity of illness or inability to take PO  Dispo: The patient is from: Home  Anticipated d/c is to: Home              Patient currently is not medically stable to d/c.   Difficult to place patient No      Consultants:  cardiology Procedures:  none Antimicrobials:  Anti-infectives (From admission, onward)    None        Objective: Vitals:   11/14/20 0507 11/14/20 0507 11/14/20 0756 11/14/20 1213  BP: (!) 142/82 (!) 142/82 138/68 130/81  Pulse: 63 63 61 61  Resp: 19  18 18   Temp: 97.7 F (36.5 C)  98.1 F (36.7 C) 98 F (36.7 C)   TempSrc:      SpO2: 97% 97% 99% 99%  Weight:  91.8 kg    Height:        Intake/Output Summary (Last 24 hours) at 11/14/2020 1336 Last data filed at 11/14/2020 1212 Gross per 24 hour  Intake 720 ml  Output 700 ml  Net 20 ml    Filed Weights   11/13/20 0330 11/13/20 0500 11/14/20 0507  Weight: 92.1 kg 92 kg 91.8 kg    Examination: General exam: Appears comfortable  HEENT: PERRLA, oral mucosa moist, no sclera icterus or thrush Respiratory system: Clear to auscultation. Respiratory effort normal. Cardiovascular system: S1 & S2 heard, regular rate and rhythm Gastrointestinal system: Abdomen soft, non-tender, nondistended. Normal bowel sounds   Central nervous system: Alert and oriented. No focal neurological deficits. Extremities: No cyanosis, clubbing or edema Skin: No rashes or ulcers Psychiatry:  Mood & affect appropriate.      Data Reviewed: I have personally reviewed following labs and imaging studies  CBC: Recent Labs  Lab 11/11/20 0215  WBC 8.1  NEUTROABS 4.3  HGB 13.3  HCT 40.7  MCV 92.1  PLT 344    Basic Metabolic Panel: Recent Labs  Lab 11/11/20 0215 11/12/20 0725 11/13/20 1324 11/14/20 0533  NA 136 137 135 134*  K 3.9 3.5 4.4 4.2  CL 103 102 102 103  CO2 24 24 24 25   GLUCOSE 123* 94 125* 102*  BUN 21 27* 33* 33*  CREATININE 1.48* 1.56* 1.76* 1.67*  CALCIUM 8.8* 9.0 9.5 9.6  PHOS  --   --   --  5.2*    GFR: Estimated Creatinine Clearance: 42.1 mL/min (A) (by C-G formula based on SCr of 1.67 mg/dL (H)). Liver Function Tests: Recent Labs  Lab 11/11/20 0215 11/14/20 0533  AST 22  --   ALT 19  --   ALKPHOS 57  --   BILITOT 0.9  --   PROT 7.8  --   ALBUMIN 4.1 3.9    Recent Labs  Lab 11/11/20 0215  LIPASE 43    No results for input(s): AMMONIA in the last 168 hours. Coagulation Profile: No results for input(s): INR, PROTIME in the last 168 hours. Cardiac Enzymes: No results for input(s): CKTOTAL, CKMB, CKMBINDEX, TROPONINI in  the last 168 hours. BNP (last 3 results) No results for input(s): PROBNP in the last 8760 hours. HbA1C: No results for input(s): HGBA1C in the last 72 hours. CBG: Recent Labs  Lab 11/13/20 2300  GLUCAP 142*   Lipid Profile: Recent Labs    11/12/20 0725  CHOL 110  HDL 22*  LDLCALC 70  TRIG 91  CHOLHDL 5.0    Thyroid Function Tests: No results for input(s): TSH, T4TOTAL, FREET4, T3FREE, THYROIDAB in the last 72 hours. Anemia Panel: No results for input(s): VITAMINB12, FOLATE, FERRITIN, TIBC, IRON, RETICCTPCT in the last 72 hours.  Urine analysis:    Component Value Date/Time   COLORURINE YELLOW 11/11/2020 0339   APPEARANCEUR CLEAR 11/11/2020 0339   LABSPEC 1.010 11/11/2020 0339   PHURINE 6.0 11/11/2020 0339   GLUCOSEU NEGATIVE 11/11/2020 0339   HGBUR NEGATIVE 11/11/2020 0339   BILIRUBINUR NEGATIVE 11/11/2020 0339   KETONESUR NEGATIVE 11/11/2020 0339   PROTEINUR 30 (A) 11/11/2020 0339   NITRITE NEGATIVE 11/11/2020 0339   LEUKOCYTESUR NEGATIVE 11/11/2020 0339   Sepsis Labs: @LABRCNTIP (procalcitonin:4,lacticidven:4) ) Recent Results (from the past 240 hour(s))  Resp Panel by RT-PCR (Flu A&B, Covid) Nasopharyngeal Swab     Status: None   Collection Time: 11/11/20  3:39 AM   Specimen: Nasopharyngeal Swab; Nasopharyngeal(NP) swabs in vial transport medium  Result Value Ref Range Status   SARS Coronavirus 2 by RT PCR NEGATIVE NEGATIVE Final    Comment: (NOTE) SARS-CoV-2 target nucleic acids are NOT DETECTED.  The SARS-CoV-2 RNA is generally detectable in upper respiratory specimens during the acute phase of infection. The lowest concentration of SARS-CoV-2 viral copies this assay can detect is 138 copies/mL. A negative result does not preclude SARS-Cov-2 infection and should not be used as the sole basis for treatment or other patient management decisions. A negative result may occur with  improper specimen collection/handling, submission of specimen other than  nasopharyngeal swab, presence of viral mutation(s) within the areas targeted by this assay, and inadequate number of viral copies(<138 copies/mL). A negative result must be combined with clinical observations, patient history, and epidemiological information. The expected result is Negative.  Fact Sheet for Patients:  11/13/20  Fact Sheet for Healthcare Providers:  BloggerCourse.com  This test is no t yet approved or cleared by the SeriousBroker.it FDA and  has been authorized for detection and/or diagnosis of SARS-CoV-2 by FDA under an Emergency Use Authorization (EUA). This EUA will remain  in effect (meaning this test can be used) for the duration of the COVID-19 declaration under Section 564(b)(1) of the Act, 21 U.S.C.section 360bbb-3(b)(1), unless the authorization is terminated  or revoked sooner.       Influenza A by PCR NEGATIVE NEGATIVE Final   Influenza B by PCR NEGATIVE NEGATIVE Final    Comment: (NOTE) The Xpert Xpress SARS-CoV-2/FLU/RSV plus assay is intended as an aid in the diagnosis of influenza from Nasopharyngeal swab specimens and should not be used as a sole basis for treatment. Nasal washings and aspirates are unacceptable for Xpert Xpress SARS-CoV-2/FLU/RSV testing.  Fact Sheet for Patients: [place]  Fact Sheet for Healthcare Providers: BloggerCourse.com  This test is not yet approved or cleared by the Serious[employer]ker.it FDA and has been authorized for detection and/or diagnosis of SARS-CoV-2 by FDA under an Emergency Use Authorization (EUA). This EUA will remain in effect (meaning this test can be used) for the duration of the COVID-19 declaration under Section 564(b)(1) of the Act, 21 U.S.C. section 360bbb-3(b)(1), unless the authorization is terminated or revoked.  Performed at [facility], [address] [place], [place]  [place]   Respiratory (~20 pathogens) panel by PCR     Status: None   Collection Time: 11/11/20  9:54 AM   Specimen: Nasopharyngeal Swab; Respiratory  Result Value Ref Range Status   Adenovirus NOT DETECTED NOT DETECTED Final   Coronavirus 229E NOT DETECTED NOT DETECTED Final    Comment: (NOTE) The Coronavirus on the Respiratory Panel, DOES NOT test for the novel  Coronavirus (2019 nCoV)    Coronavirus HKU1 NOT DETECTED NOT DETECTED Final   Coronavirus NL63 NOT  DETECTED NOT DETECTED Final   Coronavirus OC43 NOT DETECTED NOT DETECTED Final   Metapneumovirus NOT DETECTED NOT DETECTED Final   Rhinovirus / Enterovirus NOT DETECTED NOT DETECTED Final   Influenza A NOT DETECTED NOT DETECTED Final   Influenza B NOT DETECTED NOT DETECTED Final   Parainfluenza Virus 1 NOT DETECTED NOT DETECTED Final   Parainfluenza Virus 2 NOT DETECTED NOT DETECTED Final   Parainfluenza Virus 3 NOT DETECTED NOT DETECTED Final   Parainfluenza Virus 4 NOT DETECTED NOT DETECTED Final   Respiratory Syncytial Virus NOT DETECTED NOT DETECTED Final   Bordetella pertussis NOT DETECTED NOT DETECTED Final   Bordetella Parapertussis NOT DETECTED NOT DETECTED Final   Chlamydophila pneumoniae NOT DETECTED NOT DETECTED Final   Mycoplasma pneumoniae NOT DETECTED NOT DETECTED Final    Comment: Performed at [facility], [address], [place], [place] [address]         Radiology Studies: No results found.    Scheduled Meds:  allopurinol  300 mg Oral Daily   aspirin EC  81 mg Oral Daily   atorvastatin  40 mg Oral Daily   dapagliflozin propanediol  5 mg Oral Daily   guaiFENesin-dextromethorphan  5 mL Oral QID   hydrALAZINE  25 mg Oral Q6H   losartan  25 mg Oral Daily   melatonin  5 mg Oral QHS   [START ON 11/15/2020] metoprolol succinate  50 mg Oral Daily   simethicone  80 mg Oral QID   Continuous Infusions:   LOS: 3 days      Calvert Cantor, MD [facility] Pager: [email or web address] 11/14/2020,  1:36 PM

## 2020-11-15 ENCOUNTER — Encounter: Payer: Self-pay | Admitting: Internal Medicine

## 2020-11-15 ENCOUNTER — Encounter
Admission: EM | Disposition: A | Discharge status: Home / Self Care | Payer: Self-pay | Source: Home / Self Care | Attending: Internal Medicine

## 2020-11-15 DIAGNOSIS — I5021 Acute systolic (congestive) heart failure: Secondary | ICD-10-CM | POA: Diagnosis not present

## 2020-11-15 DIAGNOSIS — I25708 Atherosclerosis of coronary artery bypass graft(s), unspecified, with other forms of angina pectoris: Secondary | ICD-10-CM | POA: Diagnosis not present

## 2020-11-15 DIAGNOSIS — I2510 Atherosclerotic heart disease of native coronary artery without angina pectoris: Secondary | ICD-10-CM | POA: Diagnosis not present

## 2020-11-15 HISTORY — PX: RIGHT/LEFT HEART CATH AND CORONARY/GRAFT ANGIOGRAPHY: CATH118267

## 2020-11-15 LAB — BASIC METABOLIC PANEL
Anion gap: 6 (ref 5–15)
BUN: 36 mg/dL — ABNORMAL HIGH (ref 8–23)
CO2: 28 mmol/L (ref 22–32)
Calcium: 9.4 mg/dL (ref 8.9–10.3)
Chloride: 102 mmol/L (ref 98–111)
Creatinine, Ser: 1.76 mg/dL — ABNORMAL HIGH (ref 0.61–1.24)
GFR, Estimated: 40 mL/min — ABNORMAL LOW (ref >60)
Glucose, Bld: 100 mg/dL — ABNORMAL HIGH (ref 70–99)
Potassium: 4.3 mmol/L (ref 3.5–5.1)
Sodium: 136 mmol/L (ref 135–145)

## 2020-11-15 SURGERY — RIGHT/LEFT HEART CATH AND CORONARY/GRAFT ANGIOGRAPHY
Anesthesia: Moderate Sedation

## 2020-11-15 MED ORDER — VERAPAMIL HCL 2.5 MG/ML IV SOLN
INTRAVENOUS | Status: AC
  Filled 2020-11-15: qty 2

## 2020-11-15 MED ORDER — HEPARIN (PORCINE) IN NACL 1000-0.9 UT/500ML-% IV SOLN
INTRAVENOUS | Status: DC | PRN
  Administered 2020-11-15 (×2): 500 mL

## 2020-11-15 MED ORDER — HEPARIN SODIUM (PORCINE) 1000 UNIT/ML IJ SOLN
INTRAMUSCULAR | Status: AC
  Filled 2020-11-15: qty 1

## 2020-11-15 MED ORDER — SODIUM CHLORIDE 0.9 % IV SOLN: 250.0000 mL | INTRAVENOUS | Status: DC | PRN

## 2020-11-15 MED ORDER — METOPROLOL TARTRATE 50 MG PO TABS: 50.0000 mg | ORAL_TABLET | Freq: Once | ORAL | Status: DC

## 2020-11-15 MED ORDER — FENTANYL CITRATE (PF) 100 MCG/2ML IJ SOLN
INTRAMUSCULAR | Status: DC | PRN
  Administered 2020-11-15: 50 ug via INTRAVENOUS

## 2020-11-15 MED ORDER — HEPARIN (PORCINE) IN NACL 1000-0.9 UT/500ML-% IV SOLN
INTRAVENOUS | Status: AC
  Filled 2020-11-15: qty 1000

## 2020-11-15 MED ORDER — FENTANYL CITRATE (PF) 100 MCG/2ML IJ SOLN
INTRAMUSCULAR | Status: AC
  Filled 2020-11-15: qty 2

## 2020-11-15 MED ORDER — LIDOCAINE HCL (PF) 1 % IJ SOLN
INTRAMUSCULAR | Status: DC | PRN
  Administered 2020-11-15: 2 mL
  Administered 2020-11-15: 15 mL

## 2020-11-15 MED ORDER — MIDAZOLAM HCL 2 MG/2ML IJ SOLN
INTRAMUSCULAR | Status: AC
  Filled 2020-11-15: qty 2

## 2020-11-15 MED ORDER — IOHEXOL 350 MG/ML SOLN
INTRAVENOUS | Status: DC | PRN
  Administered 2020-11-15: 44 mL

## 2020-11-15 MED ORDER — SODIUM CHLORIDE 0.9% FLUSH
3.0000 mL | Freq: Two times a day (BID) | INTRAVENOUS | Status: DC
  Administered 2020-11-15 – 2020-11-16 (×3): 3 mL via INTRAVENOUS

## 2020-11-15 MED ORDER — LIDOCAINE HCL 1 % IJ SOLN
INTRAMUSCULAR | Status: AC
  Filled 2020-11-15: qty 20

## 2020-11-15 MED ORDER — SODIUM CHLORIDE 0.9% FLUSH: 3.0000 mL | INTRAVENOUS | Status: DC | PRN

## 2020-11-15 MED ORDER — MIDAZOLAM HCL 2 MG/2ML IJ SOLN
INTRAMUSCULAR | Status: DC | PRN
  Administered 2020-11-15: 1 mg via INTRAVENOUS

## 2020-11-15 SURGICAL SUPPLY — 16 items
CATH BALLN WEDGE 5F 110CM (CATHETERS) ×1 IMPLANT
CATH INFINITI 5 FR AL2 (CATHETERS) ×1 IMPLANT
CATH INFINITI 5 FR JL3.5 (CATHETERS) ×1 IMPLANT
CATH INFINITI 5 FR LCB (CATHETERS) ×1 IMPLANT
CATH INFINITI 5FR AL1 (CATHETERS) ×1 IMPLANT
CATH INFINITI 5FR ANG PIGTAIL (CATHETERS) ×1 IMPLANT
CATH INFINITI JR4 5F (CATHETERS) ×1 IMPLANT
DEVICE CLOSURE MYNXGRIP 5F (Vascular Products) ×1 IMPLANT
DRAPE BRACHIAL (DRAPES) ×1 IMPLANT
PACK CARDIAC CATH (CUSTOM PROCEDURE TRAY) ×2 IMPLANT
PROTECTION STATION PRESSURIZED (MISCELLANEOUS) ×2
SET ATX SIMPLICITY (MISCELLANEOUS) ×1 IMPLANT
SHEATH AVANTI 5FR X 11CM (SHEATH) ×1 IMPLANT
SHEATH GLIDE SLENDER 4/5FR (SHEATH) ×1 IMPLANT
STATION PROTECTION PRESSURIZED (MISCELLANEOUS) IMPLANT
WIRE GUIDERIGHT .035X150 (WIRE) ×1 IMPLANT

## 2020-11-15 NOTE — Progress Notes (Signed)
PROGRESS NOTE    Benjamin Valentine   QIH:[id number]  DOB: 1945/12/04  DOA: 11/11/2020 PCP: Corky Downs, MD   Brief Narrative:  Benjamin Valentine is a 75 year old male with CABG in 2002, hypertension, chronic kidney disease stage III who presents to the hospital for 3 complaints.  The first is a dry cough and shortness of breath for 2 days.  He has not had any fever chills or sweats and there is no complaint of chest pain.  The second complaint is of increased swelling of his lower extremity for 2 to 3 days.  The last complaint is bloating/distention of his abdomen.  He thought he might of been constipated even though he was having regular bowel movements.  He decided to take MiraLAX and this produced some more bowel movements but his abdominal swelling did not resolve. In the ED, work-up was essentially normal-chest x-ray revealed cardiomegaly but no pulmonary edema.  The patient was treated with a DuoNeb and IV Lasix and subsequently admitted for acute heart failure.    Subjective: No complaints today. No dyspnea on exertion and no cough.     Assessment & Plan:   Principal Problem:   Acute dyspnea with tachypnea and cough due to acute viral bronchitis and acute systolic CHF -Symptoms have been severe for 2 days prior to admission -There has been mild wheezing on exam intermittently- he has a congested cough - there is no hypoxia - I see no indications for steroids- cont Nebs and cough suppressants PRN - He was a smoker for 30 years before quitting in his 74s - At this time I have a high suspicion for an acute viral infection causing bronchitis - Rspiratory virus panel is negative -  2D echo reveals an EF 25-30% with severely decreased LV function and global hypokinesis - cardiology has planned for a cath on Monday- the patient has decided to stay the weekend for this to be done as inpatient. - given diuretics and now euvolemic- diuretics on hold for now   -cardiac cath> significant 3  vessel disease with patent grafts and RCA receiving from colalaterals- medical management recommended  Active Problems:  Essential hypertension uncontrolled - Patient's blood pressure this morning is 172/118 - Continue Toprol at 100 mg daily, enalapril 20 mg daily, spironolactone 25 mg daily, Losartan 50 mg daily, Hydralazine PRN - Losartan decreased to 25 mg today by cardiology  Bradycardia - Toprol dose reduced to 50 mg today by cardiology- He will receive the lower dose tomorrow  AKI with CKD stage 3a -Baseline creatinine appears to be 1.8-1.5 - Cr rising from 1.18 to 1.76 now - 10/1> ARB dose reduced - Aldactone held - CR down to 1.67 today  Abdominal distention - resolved  Chronic gout due to renal impairment involving foot without tophus -Continue allopurinol    Hx of CABG   Elevated troponin -Troponin elevation is likely demand ischemia secondary to high blood pressure, pulmonary issues in the setting of poor renal clearance  Obesity Body mass index is 29.96 kg/m.   Time spent in minutes: 35 DVT prophylaxis:   Lovenox  Code Status: full code Family Communication:  Level of Care: Level of care: Progressive Cardiac Disposition Plan:  Status is: Observation  The patient will require care spanning > 2 midnights and should be moved to inpatient because: IV treatments appropriate due to intensity of illness or inability to take PO  Dispo: The patient is from: Home  Anticipated d/c is to: Home              Patient currently is not medically stable to d/c.   Difficult to place patient No      Consultants:  cardiology Procedures:  none Antimicrobials:  Anti-infectives (From admission, onward)    None        Objective: Vitals:   11/15/20 1213 11/15/20 1236 11/15/20 1430 11/15/20 1500  BP: (!) 149/94 (!) 150/80 134/70 128/76  Pulse: 65 63 61 66  Resp: 18 20 17 16   Temp: 97.7 F (36.5 C) 98.3 F (36.8 C)    TempSrc:  Oral    SpO2: 99%  98% 97%   Weight:      Height:        Intake/Output Summary (Last 24 hours) at 11/15/2020 1724 Last data filed at 11/15/2020 1631 Gross per 24 hour  Intake 300.63 ml  Output 2300 ml  Net -1999.37 ml    Filed Weights   11/13/20 0500 11/14/20 0507 11/15/20 0500  Weight: 92 kg 91.8 kg 92 kg    Examination: General exam: Appears comfortable  HEENT: PERRLA, oral mucosa moist, no sclera icterus or thrush Respiratory system: Clear to auscultation. Respiratory effort normal. Cardiovascular system: S1 & S2 heard, regular rate and rhythm Gastrointestinal system: Abdomen soft, non-tender, nondistended. Normal bowel sounds   Central nervous system: Alert and oriented. No focal neurological deficits. Extremities: No cyanosis, clubbing or edema Skin: No rashes or ulcers Psychiatry:  Mood & affect appropriate.      Data Reviewed: I have personally reviewed following labs and imaging studies  CBC: Recent Labs  Lab 11/11/20 0215  WBC 8.1  NEUTROABS 4.3  HGB 13.3  HCT 40.7  MCV 92.1  PLT 344    Basic Metabolic Panel: Recent Labs  Lab 11/11/20 0215 11/12/20 0725 11/13/20 1324 11/14/20 0533 11/15/20 0423  NA 136 137 135 134* 136  K 3.9 3.5 4.4 4.2 4.3  CL 103 102 102 103 102  CO2 24 24 24 25 28   GLUCOSE 123* 94 125* 102* 100*  BUN 21 27* 33* 33* 36*  CREATININE 1.48* 1.56* 1.76* 1.67* 1.76*  CALCIUM 8.8* 9.0 9.5 9.6 9.4  PHOS  --   --   --  5.2*  --     GFR: Estimated Creatinine Clearance: 40 mL/min (A) (by C-G formula based on SCr of 1.76 mg/dL (H)). Liver Function Tests: Recent Labs  Lab 11/11/20 0215 11/14/20 0533  AST 22  --   ALT 19  --   ALKPHOS 57  --   BILITOT 0.9  --   PROT 7.8  --   ALBUMIN 4.1 3.9    Recent Labs  Lab 11/11/20 0215  LIPASE 43    No results for input(s): AMMONIA in the last 168 hours. Coagulation Profile: No results for input(s): INR, PROTIME in the last 168 hours. Cardiac Enzymes: No results for input(s): CKTOTAL, CKMB,  CKMBINDEX, TROPONINI in the last 168 hours. BNP (last 3 results) No results for input(s): PROBNP in the last 8760 hours. HbA1C: No results for input(s): HGBA1C in the last 72 hours. CBG: Recent Labs  Lab 11/13/20 2300  GLUCAP 142*    Lipid Profile: No results for input(s): CHOL, HDL, LDLCALC, TRIG, CHOLHDL, LDLDIRECT in the last 72 hours.  Thyroid Function Tests: No results for input(s): TSH, T4TOTAL, FREET4, T3FREE, THYROIDAB in the last 72 hours. Anemia Panel: No results for input(s): VITAMINB12, FOLATE, FERRITIN, TIBC, IRON, RETICCTPCT in the last 72  hours. Urine analysis:    Component Value Date/Time   COLORURINE YELLOW 11/11/2020 0339   APPEARANCEUR CLEAR 11/11/2020 0339   LABSPEC 1.010 11/11/2020 0339   PHURINE 6.0 11/11/2020 0339   GLUCOSEU NEGATIVE 11/11/2020 0339   HGBUR NEGATIVE 11/11/2020 0339   BILIRUBINUR NEGATIVE 11/11/2020 0339   KETONESUR NEGATIVE 11/11/2020 0339   PROTEINUR 30 (A) 11/11/2020 0339   NITRITE NEGATIVE 11/11/2020 0339   LEUKOCYTESUR NEGATIVE 11/11/2020 0339   Sepsis Labs: @LABRCNTIP (procalcitonin:4,lacticidven:4) ) Recent Results (from the past 240 hour(s))  Resp Panel by RT-PCR (Flu A&B, Covid) Nasopharyngeal Swab     Status: None   Collection Time: 11/11/20  3:39 AM   Specimen: Nasopharyngeal Swab; Nasopharyngeal(NP) swabs in vial transport medium  Result Value Ref Range Status   SARS Coronavirus 2 by RT PCR NEGATIVE NEGATIVE Final    Comment: (NOTE) SARS-CoV-2 target nucleic acids are NOT DETECTED.  The SARS-CoV-2 RNA is generally detectable in upper respiratory specimens during the acute phase of infection. The lowest concentration of SARS-CoV-2 viral copies this assay can detect is 138 copies/mL. A negative result does not preclude SARS-Cov-2 infection and should not be used as the sole basis for treatment or other patient management decisions. A negative result may occur with  improper specimen collection/handling, submission of  specimen other than nasopharyngeal swab, presence of viral mutation(s) within the areas targeted by this assay, and inadequate number of viral copies(<138 copies/mL). A negative result must be combined with clinical observations, patient history, and epidemiological information. The expected result is Negative.  Fact Sheet for Patients:  11/13/20  Fact Sheet for Healthcare Providers:  BloggerCourse.com  This test is no t yet approved or cleared by the [employer] FDA and  has been authorized for detection and/or diagnosis of SARS-CoV-2 by FDA under an Emergency Use Authorization (EUA). This EUA will remain  in effect (meaning this test can be used) for the duration of the COVID-19 declaration under Section 564(b)(1) of the Act, 21 U.S.C.section 360bbb-3(b)(1), unless the authorization is terminated  or revoked sooner.       Influenza A by PCR NEGATIVE NEGATIVE Final   Influenza B by PCR NEGATIVE NEGATIVE Final    Comment: (NOTE) The Xpert Xpress SARS-CoV-2/FLU/RSV plus assay is intended as an aid in the diagnosis of influenza from Nasopharyngeal swab specimens and should not be used as a sole basis for treatment. Nasal washings and aspirates are unacceptable for Xpert Xpress SARS-CoV-2/FLU/RSV testing.  Fact Sheet for Patients: [place]  Fact Sheet for Healthcare Providers: BloggerCourse.com  This test is not yet approved or cleared by the [employer] FDA and has been authorized for detection and/or diagnosis of SARS-CoV-2 by FDA under an Emergency Use Authorization (EUA). This EUA will remain in effect (meaning this test can be used) for the duration of the COVID-19 declaration under Section 564(b)(1) of the Act, 21 U.S.C. section 360bbb-3(b)(1), unless the authorization is terminated or revoked.  Performed at [facility], [address] [place], [place]   Respiratory (~20 pathogens) panel by PCR     Status: None   Collection Time: 11/11/20  9:54 AM   Specimen: Nasopharyngeal Swab; Respiratory  Result Value Ref Range Status   Adenovirus NOT DETECTED NOT DETECTED Final   Coronavirus 229E NOT DETECTED NOT DETECTED Final    Comment: (NOTE) The Coronavirus on the Respiratory Panel, DOES NOT test for the novel  Coronavirus (2019 nCoV)    Coronavirus HKU1 NOT DETECTED NOT DETECTED Final   Coronavirus NL63  NOT DETECTED NOT DETECTED Final   Coronavirus OC43 NOT DETECTED NOT DETECTED Final   Metapneumovirus NOT DETECTED NOT DETECTED Final   Rhinovirus / Enterovirus NOT DETECTED NOT DETECTED Final   Influenza A NOT DETECTED NOT DETECTED Final   Influenza B NOT DETECTED NOT DETECTED Final   Parainfluenza Virus 1 NOT DETECTED NOT DETECTED Final   Parainfluenza Virus 2 NOT DETECTED NOT DETECTED Final   Parainfluenza Virus 3 NOT DETECTED NOT DETECTED Final   Parainfluenza Virus 4 NOT DETECTED NOT DETECTED Final   Respiratory Syncytial Virus NOT DETECTED NOT DETECTED Final   Bordetella pertussis NOT DETECTED NOT DETECTED Final   Bordetella Parapertussis NOT DETECTED NOT DETECTED Final   Chlamydophila pneumoniae NOT DETECTED NOT DETECTED Final   Mycoplasma pneumoniae NOT DETECTED NOT DETECTED Final    Comment: Performed at [facility], [address], [place], [place] [address]         Radiology Studies: CARDIAC CATHETERIZATION  Result Date: 11/15/2020   Ost LM lesion is 20% stenosed.   Prox Cx to Mid Cx lesion is 99% stenosed.   Mid Cx to Dist Cx lesion is 99% stenosed.   1st Mrg lesion is 30% stenosed.   Mid LAD lesion is 100% stenosed.   Prox RCA lesion is 100% stenosed.   Origin lesion is 100% stenosed.   SVG.   LIMA graft was visualized by angiography and is normal in caliber.   The graft exhibits no disease. 1.  Significant underlying three-vessel coronary artery disease with occluded mid LAD, occluded  proximal right coronary artery and subtotal occlusion of the mid left circumflex after the origin of a large high OM1 which supplies the majority of the left circumflex distribution. Patent LIMA to LAD with occluded SVG to RCA and SVG to OM. The RCA gets left-to-right collaterals. 2.  Left ventricular angiography was not performed due to chronic kidney disease. 3.  Right heart catheterization showed high normal filling pressures, moderate pulmonary hypertension and moderately to severely reduced cardiac output. Recommendations: No revascularization is recommended for coronary artery disease.  Recommend continuing medical therapy. Oral furosemide can be resumed tomorrow.      Scheduled Meds:  allopurinol  300 mg Oral Daily   aspirin EC  81 mg Oral Daily   atorvastatin  40 mg Oral Daily   dapagliflozin propanediol  5 mg Oral Daily   guaiFENesin-dextromethorphan  5 mL Oral QID   hydrALAZINE  25 mg Oral Q6H   losartan  25 mg Oral Daily   melatonin  5 mg Oral QHS   simethicone  80 mg Oral QID   sodium chloride flush  3 mL Intravenous Q12H   Continuous Infusions:  sodium chloride       LOS: 4 days      Calvert Cantor, MD [facility] Pager: [email or web address] 11/15/2020, 5:24 PM

## 2020-11-15 NOTE — Progress Notes (Signed)
Progress Note  Patient Name: Benjamin Valentine Date of Encounter: 11/15/2020  [facility] Cardiologist: Resa Miner  Subjective   Reports improvement in shortness of breath but still has symptoms when he walks to the bathroom.  No chest pain.  Inpatient Medications    Scheduled Meds:  allopurinol  300 mg Oral Daily   aspirin EC  81 mg Oral Daily   atorvastatin  40 mg Oral Daily   dapagliflozin propanediol  5 mg Oral Daily   guaiFENesin-dextromethorphan  5 mL Oral QID   hydrALAZINE  25 mg Oral Q6H   losartan  25 mg Oral Daily   melatonin  5 mg Oral QHS   metoprolol succinate  50 mg Oral Daily   simethicone  80 mg Oral QID   sodium chloride flush  3 mL Intravenous Q12H   Continuous Infusions:  sodium chloride     sodium chloride 10 mL/hr at 11/15/20 0621   PRN Meds: sodium chloride, acetaminophen **OR** acetaminophen, albuterol, albuterol, ondansetron **OR** ondansetron (ZOFRAN) IV, sodium chloride flush   Vital Signs    Vitals:   11/15/20 0003 11/15/20 0500 11/15/20 0540 11/15/20 0807  BP: 123/89  (!) 143/69 127/61  Pulse: (!) 55  (!) 59 (!) 58  Resp:    18  Temp:   (!) 97.5 F (36.4 C) 98.1 F (36.7 C)  TempSrc:      SpO2: 97%  98% 98%  Weight:  92 kg    Height:        Intake/Output Summary (Last 24 hours) at 11/15/2020 1105 Last data filed at 11/15/2020 1000 Gross per 24 hour  Intake 720 ml  Output 1600 ml  Net -880 ml    Last 3 Weights 11/15/2020 11/14/2020 11/13/2020  Weight (lbs) 202 lb 14.4 oz 202 lb 4.8 oz 202 lb 12.8 oz  Weight (kg) 92.035 kg 91.763 kg 91.989 kg      Telemetry    Normal sinus rhythm and sinus bradycardia with rare PVC's - Personally Reviewed  ECG    No new tracing  Physical Exam   GEN: No acute distress.   Neck: No JVD Cardiac: RRR, no murmurs, rubs, or gallops.  Respiratory: Clear to auscultation bilaterally. GI: Soft, nontender, non-distended  MS: No edema; No deformity. Neuro:  Nonfocal  Psych: Normal affect    Labs    High Sensitivity Troponin:   Recent Labs  Lab 11/11/20 0215 11/11/20 0429  TROPONINIHS 59* 52*      Chemistry Recent Labs  Lab 11/11/20 0215 11/12/20 0725 11/13/20 1324 11/14/20 0533 11/15/20 0423  NA 136   < > 135 134* 136  K 3.9   < > 4.4 4.2 4.3  CL 103   < > 102 103 102  CO2 24   < > 24 25 28   GLUCOSE 123*   < > 125* 102* 100*  BUN 21   < > 33* 33* 36*  CREATININE 1.48*   < > 1.76* 1.67* 1.76*  CALCIUM 8.8*   < > 9.5 9.6 9.4  PROT 7.8  --   --   --   --   ALBUMIN 4.1  --   --  3.9  --   AST 22  --   --   --   --   ALT 19  --   --   --   --   ALKPHOS 57  --   --   --   --   BILITOT 0.9  --   --   --   --  GFRNONAA 49*   < > 40* 42* 40*  ANIONGAP 9   < > 9 6 6    < > = values in this interval not displayed.     Lipids  Recent Labs  Lab 11/12/20 0725  CHOL 110  TRIG 91  HDL 22*  LDLCALC 70  CHOLHDL 5.0     Hematology Recent Labs  Lab 11/11/20 0215  WBC 8.1  RBC 4.42  HGB 13.3  HCT 40.7  MCV 92.1  MCH 30.1  MCHC 32.7  RDW 15.5  PLT 344    Thyroid No results for input(s): TSH, FREET4 in the last 168 hours.  BNP Recent Labs  Lab 11/11/20 0215  BNP 616.5*     DDimer No results for input(s): DDIMER in the last 168 hours.   Radiology    No results found.  Cardiac Studies   See TTE report above  Patient Profile     75 y.o. male with history of coronary artery disease s/p bypass surgery 2002, hypertension, hyperlipidemia, osteoarthritis, gout, previous COVID-19 infection, admitted with shortness of breath found to have acute HFrEF and minimal troponin elevation.  Assessment & Plan    1. Acute systolic heart failure: Improved with diuresis and he does not seem to be volume overloaded at the present time.   Shortness of breath much improved and almost back to base Continue treatment with Farxiga, losartan and Toprol-XL.  2. Coronary artery disease and elevated troponin: Presentation not consistent with ACS.  Suspect mild  Tn elevation is supply-demand mismatch.  Worsening ischemic heart disease a concern in the setting of severely reduced LVEF and history of remote CABG (~20 years ago). - Add aspirin 81 mg daily. - Continue metoprolol. - Check lipid panel and add atorvastatin for target LDL < 70. -Plan for right and left cardiac catheterization to be done today.  His grafts are not known and thus we will plan femoral access.  3. Acute respiratory failure with hypoxia: Likely combination of acute HFrEF and possible bronchitis.  Lungs clear on exam today. - Maintain net even fluid balance. - Supportive care for possible bronchitis.  4. Chronic kidney disease stage 3a: Renal function is stable.  5. Hypertension: - Continue metoprolol and hydralazine (may benefit from addition of isordil from HF standpoint in the future). -Continue losartan with ultimate goal of transitioning to [facility].  Shared Decision Making/Informed Consent The risks [stroke (1 in 1000), death (1 in 1000), kidney failure [usually temporary] (1 in 500), bleeding (1 in 200), allergic reaction [possibly serious] (1 in 200)], benefits (diagnostic support and management of coronary artery disease) and alternatives of a cardiac catheterization were discussed in detail with Mr. Sigl and he is willing to proceed.  For questions or updates, please contact [facility] Please consult [email or web address] for contact info under [facility].     Signed, [facility], MD  11/15/2020, 11:05 AM

## 2020-11-15 NOTE — Care Management Important Message (Signed)
Important Message  Patient Details  Name: TAMIM SKOG MRN: [id number] Date of Birth: [birth year]   Medicare Important Message Given:  Yes     Johnell Comings 11/15/2020, 3:40 PM

## 2020-11-16 ENCOUNTER — Encounter: Payer: Self-pay | Admitting: Cardiovascular Disease

## 2020-11-16 DIAGNOSIS — I5021 Acute systolic (congestive) heart failure: Secondary | ICD-10-CM | POA: Diagnosis not present

## 2020-11-16 LAB — BASIC METABOLIC PANEL
Anion gap: 7 (ref 5–15)
BUN: 32 mg/dL — ABNORMAL HIGH (ref 8–23)
CO2: 25 mmol/L (ref 22–32)
Calcium: 9.1 mg/dL (ref 8.9–10.3)
Chloride: 104 mmol/L (ref 98–111)
Creatinine, Ser: 1.9 mg/dL — ABNORMAL HIGH (ref 0.61–1.24)
GFR, Estimated: 36 mL/min — ABNORMAL LOW (ref >60)
Glucose, Bld: 92 mg/dL (ref 70–99)
Potassium: 4.6 mmol/L (ref 3.5–5.1)
Sodium: 136 mmol/L (ref 135–145)

## 2020-11-16 MED ORDER — CARVEDILOL 3.125 MG PO TABS
3.1250 mg | ORAL_TABLET | Freq: Two times a day (BID) | ORAL | Status: DC
  Administered 2020-11-16 – 2020-11-17 (×2): 3.125 mg via ORAL
  Filled 2020-11-16 (×2): qty 1

## 2020-11-16 NOTE — Progress Notes (Signed)
Progress Note  Patient Name: Benjamin Valentine Date of Encounter: 11/16/2020  [facility] Cardiologist: New - Gollan  Subjective   R/LHC yesterday showed significant underlying 3-vessel CAD with a patent LIMA to LAD with occluded SVG to RCA and SVG to OM with medical management recommended as outlined below. RHC showed high normal filling pressures, moderate pulmonary hypertension, and moderately to severely reduced cardiac output. He reports significant improvement in his dyspnea. No chest pain or palpitations. Documented UOP 1.3 L for the past 24 hours with net - 3.7 L for the admission. No weight today. Renal function continues to slowly trend upwards.   Inpatient Medications    Scheduled Meds:  allopurinol  300 mg Oral Daily   aspirin EC  81 mg Oral Daily   atorvastatin  40 mg Oral Daily   carvedilol  3.125 mg Oral BID WC   dapagliflozin propanediol  5 mg Oral Daily   guaiFENesin-dextromethorphan  5 mL Oral QID   hydrALAZINE  25 mg Oral Q6H   melatonin  5 mg Oral QHS   simethicone  80 mg Oral QID   sodium chloride flush  3 mL Intravenous Q12H   Continuous Infusions:  sodium chloride     PRN Meds: sodium chloride, acetaminophen **OR** acetaminophen, albuterol, albuterol, ondansetron **OR** ondansetron (ZOFRAN) IV, sodium chloride flush   Vital Signs    Vitals:   11/15/20 1950 11/15/20 2320 11/16/20 0425 11/16/20 0753  BP: 108/75 124/65 (!) 150/90 138/87  Pulse: 76 73 64 73  Resp: 16 18 11 19   Temp: 97.9 F (36.6 C) 98 F (36.7 C) 97.8 F (36.6 C) 98 F (36.7 C)  TempSrc: Oral Oral Oral   SpO2: 97% 96% 99% 97%  Weight:      Height:        Intake/Output Summary (Last 24 hours) at 11/16/2020 1112 Last data filed at 11/16/2020 0955 Gross per 24 hour  Intake 180.63 ml  Output 700 ml  Net -519.37 ml    Last 3 Weights 11/15/2020 11/14/2020 11/13/2020  Weight (lbs) 202 lb 14.4 oz 202 lb 4.8 oz 202 lb 12.8 oz  Weight (kg) 92.035 kg 91.763 kg 91.989 kg       Telemetry    SR with rare PACs, 12 beat run of NSVT - Personally Reviewed  ECG    No new tracing  Physical Exam   GEN: No acute distress   Neck: No JVD Cardiac: RRR, no murmurs, rubs, or gallops. Right femoral cardiac cath site without bleeding, swelling, warmth, erythema, bruising, or TTP. No bruit.  Respiratory: Clear to auscultation bilaterally GI: Soft, nontender, non-distended  MS: No edema; No deformity Neuro:  Nonfocal  Psych: Normal affect   Labs    High Sensitivity Troponin:   Recent Labs  Lab 11/11/20 0215 11/11/20 0429  TROPONINIHS 59* 52*      Chemistry Recent Labs  Lab 11/11/20 0215 11/12/20 0725 11/14/20 0533 11/15/20 0423 11/16/20 0423  NA 136   < > 134* 136 136  K 3.9   < > 4.2 4.3 4.6  CL 103   < > 103 102 104  CO2 24   < > 25 28 25   GLUCOSE 123*   < > 102* 100* 92  BUN 21   < > 33* 36* 32*  CREATININE 1.48*   < > 1.67* 1.76* 1.90*  CALCIUM 8.8*   < > 9.6 9.4 9.1  PROT 7.8  --   --   --   --  ALBUMIN 4.1  --  3.9  --   --   AST 22  --   --   --   --   ALT 19  --   --   --   --   ALKPHOS 57  --   --   --   --   BILITOT 0.9  --   --   --   --   GFRNONAA 49*   < > 42* 40* 36*  ANIONGAP 9   < > 6 6 7    < > = values in this interval not displayed.     Lipids  Recent Labs  Lab 11/12/20 0725  CHOL 110  TRIG 91  HDL 22*  LDLCALC 70  CHOLHDL 5.0     Hematology Recent Labs  Lab 11/11/20 0215  WBC 8.1  RBC 4.42  HGB 13.3  HCT 40.7  MCV 92.1  MCH 30.1  MCHC 32.7  RDW 15.5  PLT 344    Thyroid No results for input(s): TSH, FREET4 in the last 168 hours.  BNP Recent Labs  Lab 11/11/20 0215  BNP 616.5*     DDimer No results for input(s): DDIMER in the last 168 hours.   Radiology    No new studies   Cardiac Studies   2D echo 11/11/2020: 1. Left ventricular ejection fraction, by estimation, is 25 to 30%. The  left ventricle has severely decreased function. The left ventricle  demonstrates global hypokinesis. The  left ventricular internal cavity size  was mildly to moderately dilated. Left  ventricular diastolic parameters were normal.   2. Right ventricular systolic function is normal. The right ventricular  size is normal.   3. The mitral valve is normal in structure. Mild mitral valve  regurgitation.   4. The aortic valve is normal in structure. Aortic valve regurgitation is  not visualized.  __________  [facility] 11/15/2020:   Ost LM lesion is 20% stenosed.   Prox Cx to Mid Cx lesion is 99% stenosed.   Mid Cx to Dist Cx lesion is 99% stenosed.   1st Mrg lesion is 30% stenosed.   Mid LAD lesion is 100% stenosed.   Prox RCA lesion is 100% stenosed.   Origin lesion is 100% stenosed.   SVG.   LIMA graft was visualized by angiography and is normal in caliber.   The graft exhibits no disease.   1.  Significant underlying three-vessel coronary artery disease with occluded mid LAD, occluded proximal right coronary artery and subtotal occlusion of the mid left circumflex after the origin of a large high OM1 which supplies the majority of the left circumflex distribution. Patent LIMA to LAD with occluded SVG to RCA and SVG to OM. The RCA gets left-to-right collaterals. 2.  Left ventricular angiography was not performed due to chronic kidney disease. 3.  Right heart catheterization showed high normal filling pressures, moderate pulmonary hypertension and moderately to severely reduced cardiac output.   Recommendations: No revascularization is recommended for coronary artery disease.  Recommend continuing medical therapy. Oral furosemide can be resumed tomorrow.  Patient Profile     75 y.o. male with history of coronary artery disease s/p bypass surgery 2002, hypertension, hyperlipidemia, osteoarthritis, gout, previous COVID-19 infection, admitted with shortness of breath found to have acute HFrEF and minimal troponin elevation.  Assessment & Plan    1. Acute systolic heart failure:  -Symptoms  improved with diuresis -RHC showed moderate pulmonary hypertension -With continued increase in renal function, unable to resume  Lasix today -If his renal function continues to decline, may need to consider augmentation of diuresis with milrinone  -Losartan held by primary service with decline in renal function -If labs and vitals allow, would look to start Entresto prior to discharge  -Continue treatment with Marcelline Deist and Toprol-XL -Not currently a candidate for spironolactone with declining renal function   2. Coronary artery disease and elevated troponin: -Presentation not consistent with ACS, with troponin elevation felt to be related to supply-demand mismatch -LHC 11/15/2020 without revascularization and continued medical management recommended  -Continue aspirin 81 -Beta blocker previously decreased and held secondary to bradycardia, rechallenge with low dose Coreg -LDL 70 -Atorvastatin -Post cath instructions -Cardiac rehab  3. Acute respiratory failure with hypoxia: -Improved -Likely combination of acute HFrEF and possible bronchitis  -Maintain net even fluid balance -Supportive care for possible bronchitis  4. Chronic kidney disease stage IIIa: -Renal function continues to decline -Avoid nephrotoxic agents  5. Hypertension: -BP currently well controlled  -Rechallenge with low dose Coreg as above -Hydralazine -May benefit from addition of BiDil pending renal function trend -Look to add Entresto, if possible -Losartan held as above   For questions or updates, please contact [facility] Please consult [email or web address] for contact info under [facility].     Signed, Eula Listen, [name]  11/16/2020, 11:12 AM

## 2020-11-16 NOTE — Progress Notes (Signed)
PROGRESS NOTE    TRUST LEH   DXA:[id number]  DOB: 05-Jan-1946  DOA: 11/11/2020 PCP: Corky Downs, MD   Brief Narrative:  Benjamin Valentine is a 75 year old male with CABG in 2002, hypertension, chronic kidney disease stage III who presents to the hospital for 3 complaints.  The first is a dry cough and shortness of breath for 2 days.  He has not had any fever chills or sweats and there is no complaint of chest pain.  The second complaint is of increased swelling of his lower extremity for 2 to 3 days.  The last complaint is bloating/distention of his abdomen.  He thought he might of been constipated even though he was having regular bowel movements.  He decided to take MiraLAX and this produced some more bowel movements but his abdominal swelling did not resolve. In the ED, work-up was essentially normal-chest x-ray revealed cardiomegaly but no pulmonary edema.  The patient was treated with a DuoNeb and IV Lasix and subsequently admitted for acute heart failure.    Subjective:  No complaints today.   Assessment & Plan:   Principal Problem:   Acute dyspnea with tachypnea and cough due to acute viral bronchitis and acute systolic CHF -Symptoms have been severe for 2 days prior to admission -There has been mild wheezing on exam intermittently- he has a congested cough - there is no hypoxia - I see no indications for steroids- cont Nebs and cough suppressants PRN - He was a smoker for 30 years before quitting in his 100s - At this time I have a high suspicion for an acute viral infection causing bronchitis - Rspiratory virus panel is negative -  2D echo reveals an EF 25-30% with severely decreased LV function and global hypokinesis - cardiology has planned for a cath on Monday- the patient has decided to stay the weekend for this to be done as inpatient. - given diuretics and now euvolemic- diuretics on hold for now   -cardiac cath> significant 3 vessel disease with patent grafts and RCA  receiving from colalaterals- medical management recommended  Active Problems: AKI CKD3-Baseline creatinine appears to be ~ 1.5- 1.7 - cr 1.18 when admitted steadily climbing - CR up today to 1.90 despite holding diuretics- possibly due to being NPO overnight and part of the day and possibly the dose of contrast given with the cath - hold Losartan today- hold diuretics today - follow up Cr tomorrow AM  Essential hypertension uncontrolled - Patient's blood pressure this morning is 172/118 - Continue Toprol at 100 mg daily, enalapril 20 mg daily, spironolactone 25 mg daily, Losartan 50 mg daily, Hydralazine PRN - Losartan decreased to 25 mg today by cardiology  Bradycardia - Toprol dose reduced to 50 mg today by cardiology- He will receive the lower dose tomorrow   Abdominal distention - resolved  Chronic gout due to renal impairment involving foot without tophus -Continue allopurinol    Hx of CABG   Elevated troponin -Troponin elevation is likely demand ischemia secondary to high blood pressure, pulmonary issues in the setting of poor renal clearance  Obesity Body mass index is 29.96 kg/m.   Time spent in minutes: 35 DVT prophylaxis:   Lovenox  Code Status: full code Family Communication:  Level of Care: Level of care: Progressive Cardiac Disposition Plan:  Status is: Observation  The patient will require care spanning > 2 midnights and should be moved to inpatient because: IV treatments appropriate due to intensity of illness or inability to  take PO  Dispo: The patient is from: Home              Anticipated d/c is to: Home              Patient currently is not medically stable to d/c.   Difficult to place patient No      Consultants:  cardiology Procedures:  none Antimicrobials:  Anti-infectives (From admission, onward)    None        Objective: Vitals:   11/15/20 1950 11/15/20 2320 11/16/20 0425 11/16/20 0753  BP: 108/75 124/65 (!) 150/90 138/87   Pulse: 76 73 64 73  Resp: 16 18 11 19   Temp: 97.9 F (36.6 C) 98 F (36.7 C) 97.8 F (36.6 C) 98 F (36.7 C)  TempSrc: Oral Oral Oral   SpO2: 97% 96% 99% 97%  Weight:      Height:        Intake/Output Summary (Last 24 hours) at 11/16/2020 0914 Last data filed at 11/15/2020 1911 Gross per 24 hour  Intake 60.63 ml  Output 900 ml  Net -839.37 ml    Filed Weights   11/13/20 0500 11/14/20 0507 11/15/20 0500  Weight: 92 kg 91.8 kg 92 kg    Examination: General exam: Appears comfortable  HEENT: PERRLA, oral mucosa moist, no sclera icterus or thrush Respiratory system: Clear to auscultation. Respiratory effort normal. Cardiovascular system: S1 & S2 heard, regular rate and rhythm Gastrointestinal system: Abdomen soft, non-tender, nondistended. Normal bowel sounds   Central nervous system: Alert and oriented. No focal neurological deficits. Extremities: No cyanosis, clubbing or edema Skin: No rashes or ulcers Psychiatry:  Mood & affect appropriate.       Data Reviewed: I have personally reviewed following labs and imaging studies  CBC: Recent Labs  Lab 11/11/20 0215  WBC 8.1  NEUTROABS 4.3  HGB 13.3  HCT 40.7  MCV 92.1  PLT 344    Basic Metabolic Panel: Recent Labs  Lab 11/12/20 0725 11/13/20 1324 11/14/20 0533 11/15/20 0423 11/16/20 0423  NA 137 135 134* 136 136  K 3.5 4.4 4.2 4.3 4.6  CL 102 102 103 102 104  CO2 24 24 25 28 25   GLUCOSE 94 125* 102* 100* 92  BUN 27* 33* 33* 36* 32*  CREATININE 1.56* 1.76* 1.67* 1.76* 1.90*  CALCIUM 9.0 9.5 9.6 9.4 9.1  PHOS  --   --  5.2*  --   --     GFR: Estimated Creatinine Clearance: 37.1 mL/min (A) (by C-G formula based on SCr of 1.9 mg/dL (H)). Liver Function Tests: Recent Labs  Lab 11/11/20 0215 11/14/20 0533  AST 22  --   ALT 19  --   ALKPHOS 57  --   BILITOT 0.9  --   PROT 7.8  --   ALBUMIN 4.1 3.9    Recent Labs  Lab 11/11/20 0215  LIPASE 43    No results for input(s): AMMONIA in the last  168 hours. Coagulation Profile: No results for input(s): INR, PROTIME in the last 168 hours. Cardiac Enzymes: No results for input(s): CKTOTAL, CKMB, CKMBINDEX, TROPONINI in the last 168 hours. BNP (last 3 results) No results for input(s): PROBNP in the last 8760 hours. HbA1C: No results for input(s): HGBA1C in the last 72 hours. CBG: Recent Labs  Lab 11/13/20 2300  GLUCAP 142*    Lipid Profile: No results for input(s): CHOL, HDL, LDLCALC, TRIG, CHOLHDL, LDLDIRECT in the last 72 hours.  Thyroid Function Tests: No results  for input(s): TSH, T4TOTAL, FREET4, T3FREE, THYROIDAB in the last 72 hours. Anemia Panel: No results for input(s): VITAMINB12, FOLATE, FERRITIN, TIBC, IRON, RETICCTPCT in the last 72 hours. Urine analysis:    Component Value Date/Time   COLORURINE YELLOW 11/11/2020 0339   APPEARANCEUR CLEAR 11/11/2020 0339   LABSPEC 1.010 11/11/2020 0339   PHURINE 6.0 11/11/2020 0339   GLUCOSEU NEGATIVE 11/11/2020 0339   HGBUR NEGATIVE 11/11/2020 0339   BILIRUBINUR NEGATIVE 11/11/2020 0339   KETONESUR NEGATIVE 11/11/2020 0339   PROTEINUR 30 (A) 11/11/2020 0339   NITRITE NEGATIVE 11/11/2020 0339   LEUKOCYTESUR NEGATIVE 11/11/2020 0339   Sepsis Labs: @LABRCNTIP (procalcitonin:4,lacticidven:4) ) Recent Results (from the past 240 hour(s))  Resp Panel by RT-PCR (Flu A&B, Covid) Nasopharyngeal Swab     Status: None   Collection Time: 11/11/20  3:39 AM   Specimen: Nasopharyngeal Swab; Nasopharyngeal(NP) swabs in vial transport medium  Result Value Ref Range Status   SARS Coronavirus 2 by RT PCR NEGATIVE NEGATIVE Final    Comment: (NOTE) SARS-CoV-2 target nucleic acids are NOT DETECTED.  The SARS-CoV-2 RNA is generally detectable in upper respiratory specimens during the acute phase of infection. The lowest concentration of SARS-CoV-2 viral copies this assay can detect is 138 copies/mL. A negative result does not preclude SARS-Cov-2 infection and should not be used as the  sole basis for treatment or other patient management decisions. A negative result may occur with  improper specimen collection/handling, submission of specimen other than nasopharyngeal swab, presence of viral mutation(s) within the areas targeted by this assay, and inadequate number of viral copies(<138 copies/mL). A negative result must be combined with clinical observations, patient history, and epidemiological information. The expected result is Negative.  Fact Sheet for Patients:  11/13/20  Fact Sheet for Healthcare Providers:  BloggerCourse.com  This test is no t yet approved or cleared by the [employer].[place] FDA and  has been authorized for detection and/or diagnosis of SARS-CoV-2 by FDA under an Emergency Use Authorization (EUA). This EUA will remain  in effect (meaning this test can be used) for the duration of the COVID-19 declaration under Section 564(b)(1) of the Act, 21 U.S.C.section 360bbb-3(b)(1), unless the authorization is terminated  or revoked sooner.       Influenza A by PCR NEGATIVE NEGATIVE Final   Influenza B by PCR NEGATIVE NEGATIVE Final    Comment: (NOTE) The Xpert Xpress SARS-CoV-2/FLU/RSV plus assay is intended as an aid in the diagnosis of influenza from Nasopharyngeal swab specimens and should not be used as a sole basis for treatment. Nasal washings and aspirates are unacceptable for Xpert Xpress SARS-CoV-2/FLU/RSV testing.  Fact Sheet for Patients: [place]  Fact Sheet for Healthcare Providers: BloggerCourse.com  This test is not yet approved or cleared by the [employer]ious[employer]ker.[employer] FDA and has been authorized for detection and/or diagnosis of SARS-CoV-2 by FDA under an Emergency Use Authorization (EUA). This EUA will remain in effect (meaning this test can be used) for the duration of the COVID-19 declaration under Section 564(b)(1) of the  Act, 21 U.S.C. section 360bbb-3(b)(1), unless the authorization is terminated or revoked.  Performed at [facility] [address] [place], [place]   Respiratory (~20 pathogens) panel by PCR     Status: None   Collection Time: 11/11/20  9:54 AM   Specimen: Nasopharyngeal Swab; Respiratory  Result Value Ref Range Status   Adenovirus NOT DETECTED NOT DETECTED Final   Coronavirus 229E NOT DETECTED NOT DETECTED Final    Comment: (NOTE) The Coronavirus  on the Respiratory Panel, DOES NOT test for the novel  Coronavirus (2019 nCoV)    Coronavirus HKU1 NOT DETECTED NOT DETECTED Final   Coronavirus NL63 NOT DETECTED NOT DETECTED Final   Coronavirus OC43 NOT DETECTED NOT DETECTED Final   Metapneumovirus NOT DETECTED NOT DETECTED Final   Rhinovirus / Enterovirus NOT DETECTED NOT DETECTED Final   Influenza A NOT DETECTED NOT DETECTED Final   Influenza B NOT DETECTED NOT DETECTED Final   Parainfluenza Virus 1 NOT DETECTED NOT DETECTED Final   Parainfluenza Virus 2 NOT DETECTED NOT DETECTED Final   Parainfluenza Virus 3 NOT DETECTED NOT DETECTED Final   Parainfluenza Virus 4 NOT DETECTED NOT DETECTED Final   Respiratory Syncytial Virus NOT DETECTED NOT DETECTED Final   Bordetella pertussis NOT DETECTED NOT DETECTED Final   Bordetella Parapertussis NOT DETECTED NOT DETECTED Final   Chlamydophila pneumoniae NOT DETECTED NOT DETECTED Final   Mycoplasma pneumoniae NOT DETECTED NOT DETECTED Final    Comment: Performed at [facility], [address], [place], [place] [address]         Radiology Studies: CARDIAC CATHETERIZATION  Result Date: 11/15/2020   Ost LM lesion is 20% stenosed.   Prox Cx to Mid Cx lesion is 99% stenosed.   Mid Cx to Dist Cx lesion is 99% stenosed.   1st Mrg lesion is 30% stenosed.   Mid LAD lesion is 100% stenosed.   Prox RCA lesion is 100% stenosed.   Origin lesion is 100% stenosed.   SVG.   LIMA graft was visualized by angiography and is  normal in caliber.   The graft exhibits no disease. 1.  Significant underlying three-vessel coronary artery disease with occluded mid LAD, occluded proximal right coronary artery and subtotal occlusion of the mid left circumflex after the origin of a large high OM1 which supplies the majority of the left circumflex distribution. Patent LIMA to LAD with occluded SVG to RCA and SVG to OM. The RCA gets left-to-right collaterals. 2.  Left ventricular angiography was not performed due to chronic kidney disease. 3.  Right heart catheterization showed high normal filling pressures, moderate pulmonary hypertension and moderately to severely reduced cardiac output. Recommendations: No revascularization is recommended for coronary artery disease.  Recommend continuing medical therapy. Oral furosemide can be resumed tomorrow.      Scheduled Meds:  allopurinol  300 mg Oral Daily   aspirin EC  81 mg Oral Daily   atorvastatin  40 mg Oral Daily   carvedilol  3.125 mg Oral BID WC   dapagliflozin propanediol  5 mg Oral Daily   guaiFENesin-dextromethorphan  5 mL Oral QID   hydrALAZINE  25 mg Oral Q6H   melatonin  5 mg Oral QHS   simethicone  80 mg Oral QID   sodium chloride flush  3 mL Intravenous Q12H   Continuous Infusions:  sodium chloride       LOS: 5 days      Calvert Cantor, MD [facility] Pager: [email or web address] 11/16/2020, 9:14 AM

## 2020-11-16 NOTE — TOC Initial Note (Signed)
Transition of Care [facility]) - Initial/Assessment Note    Patient Details  Name: Benjamin Valentine MRN: [id number] Date of Birth: 10/29/1945  Transition of Care [facility]) CM/SW Contact:    Chapman Fitch, RN Phone Number: 11/16/2020, 3:45 PM  Clinical Narrative:                    [facility] consult for heart failure screen [facility] Heart failure navigator notified.   Please consult TOC if needed      Patient Goals and CMS Choice        Expected Discharge Plan and Services                                                Prior Living Arrangements/Services                       Activities of Daily Living Home Assistive Devices/Equipment: Dan Humphreys (specify type) ADL Screening (condition at time of admission) Patient's cognitive ability adequate to safely complete daily activities?: Yes Is the patient deaf or have difficulty hearing?: No Does the patient have difficulty seeing, even when wearing glasses/contacts?: No Does the patient have difficulty concentrating, remembering, or making decisions?: No Patient able to express need for assistance with ADLs?: Yes Does the patient have difficulty dressing or bathing?: No Independently performs ADLs?: Yes (appropriate for developmental age) Does the patient have difficulty walking or climbing stairs?: No Weakness of Legs: None Weakness of Arms/Hands: None  Permission Sought/Granted                  Emotional Assessment              Admission diagnosis:  Abdominal distension [R14.0] CHF (congestive heart failure) (HCC) [I50.9] Dyspnea [R06.00] SOB (shortness of breath) [R06.02] Elevated troponin [R77.8] AKI (acute kidney injury) (HCC) [N17.9] Chest pain, unspecified type [R07.9] Acute on chronic congestive heart failure, unspecified heart failure type (HCC) [I50.9] Patient Active Problem List   Diagnosis Date Noted   Acute systolic heart failure (HCC)    Stage 3a chronic kidney disease (HCC) 11/11/2020    Hx of CABG 11/11/2020   Acute dyspnea 11/11/2020   Elevated troponin 11/11/2020   Gout 11/11/2020   Abdominal distention 11/11/2020   Hypertensive urgency 11/11/2020   Dyspnea 11/11/2020   Acute on chronic congestive heart failure (HCC)    AKI (acute kidney injury) (HCC)    Chronic gout due to renal impairment involving foot without tophus 04/13/2020   Primary osteoarthritis of both ankles 04/13/2020   Left groin hernia 01/26/2020   Coronary artery disease, non-occlusive 01/26/2020   Hip pain 01/26/2020   Essential hypertension 01/26/2020   Acute appendicitis 08/11/2018   PCP:  Corky Downs, MD Pharmacy:   [facility] [address] [place] - [address] [place] [address] Phone: [phone] Fax: [phone]     Social Determinants of Health (SDOH) Interventions    Readmission Risk Interventions No flowsheet data found.

## 2020-11-16 NOTE — Consult Note (Addendum)
   Heart Failure Nurse Navigator Note  HFrEF 25-30%  He presented to the emergency room with complaints of shortness of breath, abdominal fullness and lower extremity edema.  He also noted a dry cough  Comorbidities:  Coronary artery disease with coronary artery bypass grafting Gout Chronic kidney disease stage III Hypertension Arthritis   Medications: Aspirin 81 mg Atorvastatin 40 mg daily Coreg 3.125 mg twice a day Farxiga 5 mg daily Hydralazine 25 mg every 6 hours   Labs:  Sodium 136, potassium 4.6, chloride 104, CO2 25, BUN 32, creatinine 1.9, BNP on admission was 616.  Initial meeting with patient today.  He states that he lives at home with his daughter but does not eat the food that she fixes.  He does admit to going to [employer] at least 4-5 times a week and getting a chicken biscuit.  Discussed the importance of eating healthy and sticking with a 2000 mg sodium restriction.  Discussed foods to eat and foods to avoid.  Also discussed fluid restriction of 64 ounces.  Patient states that 1 time that he was drinking a lot of sodas but has cut that out of his diet.  He does admit to drinking at least 48 ounces of water daily, he does not drink coffee.  He states that he has a daughter-in-law that is very involved in his cares and she sets up his medications in a medication box and is also going to make a list of foods that he can eat and not eat.  I also told him that I would provide him with information to that fact.  Patient states that he does not have a scale and does not weigh himself daily I am going to provide him with a scale.  I  would come back and talk further when his daughter-in-law is present and I told him that I would be more than glad to.  Discussed the outpatient heart failure clinic, he has an appointment to see Inetta Fermo on October 13 at 4 PM.  Will continue to follow along.  Tresa Endo RN [facility]

## 2020-11-17 DIAGNOSIS — I5023 Acute on chronic systolic (congestive) heart failure: Secondary | ICD-10-CM

## 2020-11-17 DIAGNOSIS — I2510 Atherosclerotic heart disease of native coronary artery without angina pectoris: Secondary | ICD-10-CM

## 2020-11-17 DIAGNOSIS — I5021 Acute systolic (congestive) heart failure: Secondary | ICD-10-CM | POA: Diagnosis not present

## 2020-11-17 LAB — BASIC METABOLIC PANEL
Anion gap: 10 (ref 5–15)
BUN: 35 mg/dL — ABNORMAL HIGH (ref 8–23)
CO2: 24 mmol/L (ref 22–32)
Calcium: 9.6 mg/dL (ref 8.9–10.3)
Chloride: 101 mmol/L (ref 98–111)
Creatinine, Ser: 1.7 mg/dL — ABNORMAL HIGH (ref 0.61–1.24)
GFR, Estimated: 41 mL/min — ABNORMAL LOW (ref >60)
Glucose, Bld: 93 mg/dL (ref 70–99)
Potassium: 4.6 mmol/L (ref 3.5–5.1)
Sodium: 135 mmol/L (ref 135–145)

## 2020-11-17 MED ORDER — HYDRALAZINE HCL 50 MG PO TABS: 50.0000 mg | ORAL_TABLET | Freq: Three times a day (TID) | ORAL | Status: DC

## 2020-11-17 MED ORDER — HYDRALAZINE HCL 50 MG PO TABS: 50.0000 mg | ORAL_TABLET | Freq: Three times a day (TID) | ORAL | 0 refills | Status: DC

## 2020-11-17 MED ORDER — CARVEDILOL 3.125 MG PO TABS: 3.1250 mg | ORAL_TABLET | Freq: Two times a day (BID) | ORAL | 0 refills | Status: DC

## 2020-11-17 MED ORDER — ATORVASTATIN CALCIUM 40 MG PO TABS: 40.0000 mg | ORAL_TABLET | Freq: Every day | ORAL | 0 refills | Status: DC

## 2020-11-17 MED ORDER — DAPAGLIFLOZIN PROPANEDIOL 5 MG PO TABS: 5.0000 mg | ORAL_TABLET | Freq: Every day | ORAL | 0 refills | Status: DC

## 2020-11-17 NOTE — Discharge Summary (Signed)
Physician Discharge Summary  Patient ID: Benjamin Valentine MRN: [id number] DOB/AGE: Jul 04, 1945 75 y.o.  Admit date: 11/11/2020 Discharge date: 11/17/2020  Admission Diagnoses:  Discharge Diagnoses:  Principal Problem:   Acute dyspnea Active Problems:   Left groin hernia   Essential hypertension   Chronic gout due to renal impairment involving foot without tophus   Stage 3a chronic kidney disease (HCC)   Hx of CABG   Elevated troponin   Gout   Abdominal distention   Hypertensive urgency   Dyspnea   Acute HFrEF (heart failure with reduced ejection fraction) [facility])   Discharged Condition: good  Hospital Course:  Benjamin Valentine is a 75 year old male with CABG in 2002, hypertension, chronic kidney disease stage III who presents to the hospital for 3 complaints.  The first is a dry cough and shortness of breath for 2 days.  He has not had any fever chills or sweats and there is no complaint of chest pain.  The second complaint is of increased swelling of his lower extremity for 2 to 3 days.  The last complaint is bloating/distention of his abdomen.  He thought he might of been constipated even though he was having regular bowel movements.  He decided to take MiraLAX and this produced some more bowel movements but his abdominal swelling did not resolve. In the ED, work-up was essentially normal-chest x-ray revealed cardiomegaly but no pulmonary edema.  The patient was treated with a DuoNeb and IV Lasix and subsequently admitted for acute heart failure.  Acute on chronic systolic congestive heart failure. Acute viral bronchitis. Patient echocardiogram showed ejection fraction 25 to 30% with global hypokinesia.  Patient was initially treated with IV Lasix, he developed worsening renal function. Currently, patient condition is better. Renal function also improved after holding back diuretics. Cardiology has started the Coreg and hydralazine.  We will continue current treatment. Continue hold  diuretics for now until seen by PCP.  May consider restart diuretics.   Chronic kidney disease stage IIIa. Patient does not meet criteria for acute kidney injury.  Acute kidney injury ruled out.  Essential hypertension Continue hydralazine and Coreg.     Consults: cardiology and general surgery  Significant Diagnostic Studies:  Heart cath:   Ost LM lesion is 20% stenosed.   Prox Cx to Mid Cx lesion is 99% stenosed.   Mid Cx to Dist Cx lesion is 99% stenosed.   1st Mrg lesion is 30% stenosed.   Mid LAD lesion is 100% stenosed.   Prox RCA lesion is 100% stenosed.   Origin lesion is 100% stenosed.   SVG.   LIMA graft was visualized by angiography and is normal in caliber.   The graft exhibits no disease.   1.  Significant underlying three-vessel coronary artery disease with occluded mid LAD, occluded proximal right coronary artery and subtotal occlusion of the mid left circumflex after the origin of a large high OM1 which supplies the majority of the left circumflex distribution. Patent LIMA to LAD with occluded SVG to RCA and SVG to OM. The RCA gets left-to-right collaterals. 2.  Left ventricular angiography was not performed due to chronic kidney disease. 3.  Right heart catheterization showed high normal filling pressures, moderate pulmonary hypertension and moderately to severely reduced cardiac output.   Recommendations: No revascularization is recommended for coronary artery disease.  Recommend continuing medical therapy. Oral furosemide can be resumed tomorrow.  Echo:  Left ventricular ejection fraction, by estimation, is 25 to 30%. The left ventricle has severely  decreased function. The left ventricle demonstrates global hypokinesis. The left ventricular internal cavity size was mildly to moderately dilated. Left ventricular diastolic parameters were normal. 1. 2. Right ventricular systolic function is normal. The right ventricular size is normal. 3. The mitral valve  is normal in structure. Mild mitral valve regurgitation. 4. The aortic valve is normal in structure. Aortic valve regurgitation is not visualized.   Treatments: IV lasix, beta blocker  Discharge Exam: Blood pressure (!) 154/92, pulse 74, temperature 98 F (36.7 C), resp. rate 18, height 5\' 9"  (1.753 m), weight 92 kg, SpO2 99 %. General appearance: alert and cooperative Resp: Decresaed breathing sounds Cardio: regular rate and rhythm, S1, S2 normal, no murmur, click, rub or gallop GI: soft, non-tender; bowel sounds normal; no masses,  no organomegaly Extremities: extremities normal, atraumatic, no cyanosis or edema  Disposition: Discharge disposition: 01-Home or Self Care       Discharge Instructions     Diet - low sodium heart healthy   Complete by: As directed    Increase activity slowly   Complete by: As directed       Allergies as of 11/17/2020   No Known Allergies      Medication List     STOP taking these medications    enalapril 20 MG tablet Commonly known as: VASOTEC   furosemide 20 MG tablet Commonly known as: Lasix   meloxicam 15 MG tablet Commonly known as: MOBIC   naproxen sodium 550 MG tablet Commonly known as: ANAPROX   spironolactone 25 MG tablet Commonly known as: ALDACTONE       TAKE these medications    albuterol 108 (90 Base) MCG/ACT inhaler Commonly known as: VENTOLIN HFA Inhale 2 puffs into the lungs every 6 (six) hours as needed for wheezing or shortness of breath.   allopurinol 300 MG tablet Commonly known as: ZYLOPRIM TAKE 1 TABLET BY MOUTH EVERY DAY   aspirin EC 81 MG tablet Take 81 mg by mouth daily.   atorvastatin 40 MG tablet Commonly known as: LIPITOR Take 1 tablet (40 mg total) by mouth daily. Start taking on: November 18, 2020   carvedilol 3.125 MG tablet Commonly known as: COREG Take 1 tablet (3.125 mg total) by mouth 2 (two) times daily with a meal.   dapagliflozin propanediol 5 MG Tabs tablet Commonly  known as: FARXIGA Take 1 tablet (5 mg total) by mouth daily. Start taking on: November 18, 2020   hydrALAZINE 50 MG tablet Commonly known as: APRESOLINE Take 1 tablet (50 mg total) by mouth every 8 (eight) hours.        Follow-up Information     November 20, 2020, MD. Schedule an appointment as soon as possible for a visit in 2 week(s).   Specialty: General Surgery Why: left inguinal hernia Contact information: [address] [place] [phone]         [phone], MD Follow up in 1 week(s).   Specialties: Internal Medicine, Cardiology Contact information: [address] [place] [phone]         [phone], MD Follow up in 1 week(s).   Specialty: Cardiology Contact information: [address] [place] [phone]                35 minutes Signed: [phone] 11/17/2020, 10:40 AM

## 2020-11-17 NOTE — Progress Notes (Signed)
Progress Note  Patient Name: Benjamin Valentine Date of Encounter: 11/17/2020  Primary Cardiologist: Julien Nordmann, MD  Subjective   Feels well.  Walked quite a bit last night-made 5 trips around the nursing station without dyspnea or chest pain.  Eager to go home.  Inpatient Medications    Scheduled Meds:  allopurinol  300 mg Oral Daily   aspirin EC  81 mg Oral Daily   atorvastatin  40 mg Oral Daily   carvedilol  3.125 mg Oral BID WC   dapagliflozin propanediol  5 mg Oral Daily   guaiFENesin-dextromethorphan  5 mL Oral QID   hydrALAZINE  50 mg Oral Q8H   melatonin  5 mg Oral QHS   simethicone  80 mg Oral QID   sodium chloride flush  3 mL Intravenous Q12H   Continuous Infusions:  sodium chloride     PRN Meds: sodium chloride, acetaminophen **OR** acetaminophen, albuterol, albuterol, ondansetron **OR** ondansetron (ZOFRAN) IV, sodium chloride flush   Vital Signs    Vitals:   11/17/20 0012 11/17/20 0013 11/17/20 0436 11/17/20 0733  BP: (!) 144/75 (!) 144/75 (!) 122/107 (!) 154/92  Pulse:  78 75 74  Resp:  18 18 18   Temp:  98.7 F (37.1 C) 98.9 F (37.2 C) 98 F (36.7 C)  TempSrc:  Oral Oral Oral  SpO2:  97% 98% 99%  Weight:      Height:        Intake/Output Summary (Last 24 hours) at 11/17/2020 1139 Last data filed at 11/17/2020 0910 Gross per 24 hour  Intake 1200 ml  Output 1850 ml  Net -650 ml   Filed Weights   11/13/20 0500 11/14/20 0507 11/15/20 0500  Weight: 92 kg 91.8 kg 92 kg    Physical Exam   GEN: Well nourished, well developed, in no acute distress.  HEENT: Grossly normal.  Neck: Supple, no JVD, carotid bruits, or masses. Cardiac: RRR, no murmurs, rubs, or gallops. No clubbing, cyanosis, edema.  PT 2+ and equal bilaterally.  R groin cath site w/o bleeding/bruit/hematoma. Respiratory:  Respirations regular and unlabored, clear to auscultation bilaterally. GI: Soft, nontender, nondistended, BS + x 4. MS: no deformity or atrophy. Skin: warm and  dry, no rash. Neuro:  Strength and sensation are intact. Psych: AAOx3.  Normal affect.  Labs    Chemistry Recent Labs  Lab 11/11/20 0215 11/12/20 0725 11/14/20 0533 11/15/20 0423 11/16/20 0423 11/17/20 0723  NA 136   < > 134* 136 136 135  K 3.9   < > 4.2 4.3 4.6 4.6  CL 103   < > 103 102 104 101  CO2 24   < > 25 28 25 24   GLUCOSE 123*   < > 102* 100* 92 93  BUN 21   < > 33* 36* 32* 35*  CREATININE 1.48*   < > 1.67* 1.76* 1.90* 1.70*  CALCIUM 8.8*   < > 9.6 9.4 9.1 9.6  PROT 7.8  --   --   --   --   --   ALBUMIN 4.1  --  3.9  --   --   --   AST 22  --   --   --   --   --   ALT 19  --   --   --   --   --   ALKPHOS 57  --   --   --   --   --   BILITOT 0.9  --   --   --   --   --  GFRNONAA 49*   < > 42* 40* 36* 41*  ANIONGAP 9   < > 6 6 7 10    < > = values in this interval not displayed.     Hematology Recent Labs  Lab 11/11/20 0215  WBC 8.1  RBC 4.42  HGB 13.3  HCT 40.7  MCV 92.1  MCH 30.1  MCHC 32.7  RDW 15.5  PLT 344    Cardiac Enzymes  Recent Labs  Lab 11/11/20 0215 11/11/20 0429  TROPONINIHS 59* 52*      BNP Recent Labs  Lab 11/11/20 0215  BNP 616.5*    Lipids  Lab Results  Component Value Date   CHOL 110 11/12/2020   HDL 22 (L) 11/12/2020   LDLCALC 70 11/12/2020   TRIG 91 11/12/2020   CHOLHDL 5.0 11/12/2020   Radiology    ------  Telemetry    Regular sinus rhythm, PVCs and PACs- Personally Reviewed  Cardiac Studies   2D echo 11/11/2020: 1. Left ventricular ejection fraction, by estimation, is 25 to 30%. The  left ventricle has severely decreased function. The left ventricle  demonstrates global hypokinesis. The left ventricular internal cavity size  was mildly to moderately dilated. Left  ventricular diastolic parameters were normal.   2. Right ventricular systolic function is normal. The right ventricular  size is normal.   3. The mitral valve is normal in structure. Mild mitral valve  regurgitation.   4. The aortic valve  is normal in structure. Aortic valve regurgitation is  not visualized.  __________   [facility] 11/15/2020:   Ost LM lesion is 20% stenosed.   Prox Cx to Mid Cx lesion is 99% stenosed.   Mid Cx to Dist Cx lesion is 99% stenosed.   1st Mrg lesion is 30% stenosed.   Mid LAD lesion is 100% stenosed.   Prox RCA lesion is 100% stenosed.   Origin lesion is 100% stenosed.   SVG.   LIMA graft was visualized by angiography and is normal in caliber.   The graft exhibits no disease.   1.  Significant underlying three-vessel coronary artery disease with occluded mid LAD, occluded proximal right coronary artery and subtotal occlusion of the mid left circumflex after the origin of a large high OM1 which supplies the majority of the left circumflex distribution. Patent LIMA to LAD with occluded SVG to RCA and SVG to OM. The RCA gets left-to-right collaterals. 2.  Left ventricular angiography was not performed due to chronic kidney disease. 3.  Right heart catheterization showed high normal filling pressures, moderate pulmonary hypertension and moderately to severely reduced cardiac output.   Recommendations: No revascularization is recommended for coronary artery disease.  Recommend continuing medical therapy. Oral furosemide can be resumed tomorrow.  Patient Profile     75 y.o. male with history of coronary artery disease s/p bypass surgery 2002, hypertension, hyperlipidemia, osteoarthritis, gout, previous COVID-19 infection, admitted with shortness of breath found to have acute HFrEF and minimal troponin elevation.  Assessment & Plan    1.  Acute heart failure with reduced ejection fraction/ischemic cardiomyopathy: Admitted September 29, with dyspnea and troponin elevation to 59.  Echo with severely reduced LV function of 25 to 30%.  Diagnostic catheterization October 3 with severe native multivessel disease with a patent LIMA to the LAD as outlined above in the cath report.  Right heart cath notable for  normal filling pressures and moderate pulmonary hypertension.  He feels well this a.m. and has been ambulating without symptoms or limitations.  Lasix, spironolactone, and Entresto have been on hold and creat stable @ 1.7 this AM (down from 1.9).  Continue current dose of carvedilol.  Hydralazine titrated to 50 mg 3 times daily given elevated blood pressures this morning.  Continue Farxiga.  He is okay for discharge from our standpoint but will need early outpatient follow-up with repeat basic metabolic panel.  He was previously on furosemide 20 mg twice daily at home.  Will likely need resumption of diuretic at early outpatient follow-up.  Poor candidate for digoxin in the setting of chronic kidney disease.  2.  Coronary artery disease/demand ischemia: Elevated troponin to a peak of 59 in the setting of cute heart failure.  He has not been having chest pain.  Diagnostic catheterization with native multivessel coronary disease with patent LIMA to the LAD and occluded vein graft to the RCA and OM.  Continue medical therapy with aspirin, statin, and beta-blocker.  He will benefit from cardiac rehab.  3.  Stage III chronic kidney disease: Creatinine improved today off nephrotoxic agents.  Plan follow-up basic metabolic panel at office visit.  4.  Essential hypertension: Blood pressure elevated this morning.  Hydralazine titrated.  Continue carvedilol.  5.  Hyperlipidemia: Continue statin therapy.  LDL was 70 on September 30.  Signed, Nicolasa Ducking, NP  11/17/2020, 11:39 AM    For questions or updates, please contact   Please consult [email or web address] for contact info under Cardiology/STEMI.

## 2020-11-19 ENCOUNTER — Ambulatory Visit: Payer: Medicare HMO | Admitting: Family

## 2020-11-24 ENCOUNTER — Ambulatory Visit (INDEPENDENT_AMBULATORY_CARE_PROVIDER_SITE_OTHER): Payer: Medicare HMO | Admitting: Internal Medicine

## 2020-11-24 ENCOUNTER — Encounter: Payer: Self-pay | Admitting: Internal Medicine

## 2020-11-24 ENCOUNTER — Other Ambulatory Visit: Payer: Self-pay

## 2020-11-24 VITALS — BP 141/80 | HR 92 | Ht 69.0 in | Wt 200.6 lb

## 2020-11-24 DIAGNOSIS — I2510 Atherosclerotic heart disease of native coronary artery without angina pectoris: Secondary | ICD-10-CM

## 2020-11-24 DIAGNOSIS — I10 Essential (primary) hypertension: Secondary | ICD-10-CM

## 2020-11-24 DIAGNOSIS — M1A3790 Chronic gout due to renal impairment, unspecified ankle and foot, without tophus (tophi): Secondary | ICD-10-CM | POA: Diagnosis not present

## 2020-11-24 DIAGNOSIS — N1831 Chronic kidney disease, stage 3a: Secondary | ICD-10-CM

## 2020-11-24 DIAGNOSIS — K4090 Unilateral inguinal hernia, without obstruction or gangrene, not specified as recurrent: Secondary | ICD-10-CM | POA: Diagnosis not present

## 2020-11-24 MED ORDER — CARVEDILOL 3.125 MG PO TABS: 3.1250 mg | ORAL_TABLET | Freq: Two times a day (BID) | ORAL | 6 refills | Status: DC

## 2020-11-24 MED ORDER — ATORVASTATIN CALCIUM 40 MG PO TABS: 40.0000 mg | ORAL_TABLET | Freq: Every day | ORAL | 6 refills | Status: DC

## 2020-11-24 MED ORDER — HYDRALAZINE HCL 50 MG PO TABS: 50.0000 mg | ORAL_TABLET | Freq: Three times a day (TID) | ORAL | 6 refills | Status: DC

## 2020-11-24 NOTE — Assessment & Plan Note (Signed)
Patient has a large left inguinal hernia and will need surgery for that.

## 2020-11-24 NOTE — Assessment & Plan Note (Signed)

## 2020-11-24 NOTE — Progress Notes (Signed)
Established Patient Office Visit  Subjective:  Patient ID: Benjamin Valentine, male    DOB: 1945-09-26  Age: 75 y.o. MRN: [id number]  CC:  Chief Complaint  Patient presents with   Hospitalization Follow-up    Admitted on 11/11/20 and discharged on 11/12/20    HPI  Benjamin Valentine presents for check up patient has a cardiac cath has a multiple vessel coronary artery disease please see the report of the cath for full detail  Past Medical History:  Diagnosis Date   Arthritis    Coronary artery disease    Gout    Hypertension     Past Surgical History:  Procedure Laterality Date   CARDIAC SURGERY     CABG 2002   LAPAROSCOPIC APPENDECTOMY N/A 08/11/2018   Procedure: APPENDECTOMY LAPAROSCOPIC;  Surgeon: Henrene Dodge, MD;  Location: [facility] ORS;  Service: General;  Laterality: N/A;   RIGHT/LEFT HEART CATH AND CORONARY/GRAFT ANGIOGRAPHY N/A 11/15/2020   Procedure: RIGHT/LEFT HEART CATH AND CORONARY/GRAFT ANGIOGRAPHY;  Surgeon: Iran Ouch, MD;  Location: [facility] INVASIVE CV LAB;  Service: Cardiovascular;  Laterality: N/A;    History reviewed. No pertinent family history.  Social History   Socioeconomic History   Marital status: Widowed    Spouse name: Not on file   Number of children: Not on file   Years of education: Not on file   Highest education level: Not on file  Occupational History   Not on file  Tobacco Use   Smoking status: Former   Smokeless tobacco: Never  Vaping Use   Vaping Use: Never used  Substance and Sexual Activity   Alcohol use: No   Drug use: No   Sexual activity: Not on file  Other Topics Concern   Not on file  Social History Narrative   Not on file   Social Determinants of Health   Financial Resource Strain: Not on file  Food Insecurity: Not on file  Transportation Needs: Not on file  Physical Activity: Not on file  Stress: Not on file  Social Connections: Not on file  Intimate Partner Violence: Not on file     Current Outpatient  Medications:    albuterol (VENTOLIN HFA) 108 (90 Base) MCG/ACT inhaler, Inhale 2 puffs into the lungs every 6 (six) hours as needed for wheezing or shortness of breath., Disp: 1 Inhaler, Rfl: 2   allopurinol (ZYLOPRIM) 300 MG tablet, TAKE 1 TABLET BY MOUTH EVERY DAY, Disp: 90 tablet, Rfl: 2   aspirin EC 81 MG tablet, Take 81 mg by mouth daily., Disp: , Rfl:    dapagliflozin propanediol (FARXIGA) 5 MG TABS tablet, Take 1 tablet (5 mg total) by mouth daily., Disp: 30 tablet, Rfl: 0   hydrALAZINE (APRESOLINE) 50 MG tablet, Take 1 tablet (50 mg total) by mouth every 8 (eight) hours., Disp: 100 tablet, Rfl: 0   hydrALAZINE (APRESOLINE) 50 MG tablet, Take 1 tablet (50 mg total) by mouth 3 (three) times daily., Disp: 90 tablet, Rfl: 6   atorvastatin (LIPITOR) 40 MG tablet, Take 1 tablet (40 mg total) by mouth daily., Disp: 30 tablet, Rfl: 6   carvedilol (COREG) 3.125 MG tablet, Take 1 tablet (3.125 mg total) by mouth 2 (two) times daily with a meal., Disp: 60 tablet, Rfl: 6   No Known Allergies  ROS Review of Systems  Constitutional: Negative.   HENT: Negative.    Eyes: Negative.   Respiratory: Negative.    Cardiovascular: Negative.   Gastrointestinal: Negative.   Endocrine: Negative.  Genitourinary: Negative.   Musculoskeletal: Negative.   Skin: Negative.   Allergic/Immunologic: Negative.   Neurological: Negative.   Hematological: Negative.   Psychiatric/Behavioral: Negative.    All other systems reviewed and are negative.    Objective:    Physical Exam Vitals reviewed.  Constitutional:      Appearance: Normal appearance.  HENT:     Mouth/Throat:     Mouth: Mucous membranes are moist.  Eyes:     Pupils: Pupils are equal, round, and reactive to light.  Neck:     Vascular: No carotid bruit.  Cardiovascular:     Rate and Rhythm: Normal rate and regular rhythm.     Pulses: Normal pulses.     Heart sounds: Normal heart sounds.  Pulmonary:     Effort: Pulmonary effort is normal.      Breath sounds: Normal breath sounds.  Abdominal:     General: Bowel sounds are normal.     Palpations: Abdomen is soft. There is no hepatomegaly, splenomegaly or mass.     Tenderness: There is no abdominal tenderness.     Hernia: No hernia is present.  Musculoskeletal:     Cervical back: Neck supple.     Right lower leg: No edema.     Left lower leg: No edema.  Skin:    Findings: No rash.  Neurological:     Mental Status: He is alert and oriented to person, place, and time.     Motor: No weakness.  Psychiatric:        Mood and Affect: Mood normal.        Behavior: Behavior normal.    BP (!) 141/80   Pulse 92   Ht 5\' 9"  (1.753 m)   Wt 200 lb 9.6 oz (91 kg)   BMI 29.62 kg/m  Wt Readings from Last 3 Encounters:  11/24/20 200 lb 9.6 oz (91 kg)  11/15/20 202 lb 14.4 oz (92 kg)  06/23/20 201 lb 8 oz (91.4 kg)     Health Maintenance Due  Topic Date Due   COVID-19 Vaccine (1) Never done   Hepatitis C Screening  Never done   TETANUS/TDAP  Never done   Zoster Vaccines- Shingrix (1 of 2) Never done   INFLUENZA VACCINE  Never done    There are no preventive care reminders to display for this patient.  No results found for: TSH Lab Results  Component Value Date   WBC 8.1 11/11/2020   HGB 13.3 11/11/2020   HCT 40.7 11/11/2020   MCV 92.1 11/11/2020   PLT 344 11/11/2020   Lab Results  Component Value Date   NA 135 11/17/2020   K 4.6 11/17/2020   CO2 24 11/17/2020   GLUCOSE 93 11/17/2020   BUN 35 (H) 11/17/2020   CREATININE 1.70 (H) 11/17/2020   BILITOT 0.9 11/11/2020   ALKPHOS 57 11/11/2020   AST 22 11/11/2020   ALT 19 11/11/2020   PROT 7.8 11/11/2020   ALBUMIN 3.9 11/14/2020   CALCIUM 9.6 11/17/2020   ANIONGAP 10 11/17/2020   Lab Results  Component Value Date   CHOL 110 11/12/2020   Lab Results  Component Value Date   HDL 22 (L) 11/12/2020   Lab Results  Component Value Date   LDLCALC 70 11/12/2020   Lab Results  Component Value Date   TRIG 91  11/12/2020   Lab Results  Component Value Date   CHOLHDL 5.0 11/12/2020   No results found for: HGBA1C    Assessment &  Plan:   Problem List Items Addressed This Visit       Cardiovascular and Mediastinum   Coronary artery disease, non-occlusive    Patient will be treated medically intensively      Relevant Medications   hydrALAZINE (APRESOLINE) 50 MG tablet   carvedilol (COREG) 3.125 MG tablet   atorvastatin (LIPITOR) 40 MG tablet   Essential hypertension - Primary     Patient denies any chest pain or shortness of breath there is no history of palpitation or paroxysmal nocturnal dyspnea   patient was advised to follow low-salt low-cholesterol diet    ideally I want to keep systolic blood pressure below 116 mmHg, patient was asked to check blood pressure one times a week and give me a report on that.  Patient will be follow-up in 3 months  or earlier as needed, patient will call me back for any change in the cardiovascular symptoms Patient was advised to buy a book from local bookstore concerning blood pressure and read several chapters  every day.  This will be supplemented by some of the material we will give him from the office.  Patient should also utilize other resources like YouTube and Internet to learn more about the blood pressure and the diet.      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       Meds ordered this encounter  Medications   hydrALAZINE (APRESOLINE) 50  MG tablet    Sig: Take 1 tablet (50 mg total) by mouth 3 (three) times daily.    Dispense:  90 tablet    Refill:  6   carvedilol (COREG) 3.125 MG tablet    Sig: Take 1 tablet (3.125 mg total) by mouth 2 (two) times daily with a meal.    Dispense:  60 tablet    Refill:  6   atorvastatin (LIPITOR) 40 MG tablet    Sig: Take 1 tablet (40 mg total) by mouth daily.    Dispense:  30 tablet    Refill:  6    Follow-up: No follow-ups on file.    Corky Downs, MD

## 2020-11-24 NOTE — Assessment & Plan Note (Signed)
Patient was advised to avoid all kinds of NSAIDs

## 2020-11-24 NOTE — Assessment & Plan Note (Signed)
Patient was advised to stop taking naproxen, his gout is under control

## 2020-11-24 NOTE — Assessment & Plan Note (Signed)
Patient will be treated medically intensively

## 2020-11-25 ENCOUNTER — Ambulatory Visit (INDEPENDENT_AMBULATORY_CARE_PROVIDER_SITE_OTHER): Payer: Medicare HMO | Admitting: *Deleted

## 2020-11-25 ENCOUNTER — Observation Stay
Admission: EM | Admit: 2020-11-25 | Discharge: 2020-11-26 | Disposition: A | Discharge status: Home / Self Care | Payer: Medicare HMO | Attending: Internal Medicine | Admitting: Internal Medicine

## 2020-11-25 ENCOUNTER — Emergency Department: Payer: Medicare HMO

## 2020-11-25 ENCOUNTER — Other Ambulatory Visit: Payer: Self-pay

## 2020-11-25 ENCOUNTER — Observation Stay: Payer: Medicare HMO

## 2020-11-25 ENCOUNTER — Encounter: Payer: Self-pay | Admitting: Radiology

## 2020-11-25 ENCOUNTER — Ambulatory Visit: Payer: Medicare HMO | Admitting: Family

## 2020-11-25 DIAGNOSIS — N1831 Chronic kidney disease, stage 3a: Secondary | ICD-10-CM | POA: Insufficient documentation

## 2020-11-25 DIAGNOSIS — I5022 Chronic systolic (congestive) heart failure: Secondary | ICD-10-CM | POA: Diagnosis not present

## 2020-11-25 DIAGNOSIS — M109 Gout, unspecified: Secondary | ICD-10-CM | POA: Diagnosis present

## 2020-11-25 DIAGNOSIS — I2510 Atherosclerotic heart disease of native coronary artery without angina pectoris: Secondary | ICD-10-CM | POA: Insufficient documentation

## 2020-11-25 DIAGNOSIS — R29818 Other symptoms and signs involving the nervous system: Secondary | ICD-10-CM | POA: Diagnosis not present

## 2020-11-25 DIAGNOSIS — J449 Chronic obstructive pulmonary disease, unspecified: Secondary | ICD-10-CM | POA: Insufficient documentation

## 2020-11-25 DIAGNOSIS — Z7982 Long term (current) use of aspirin: Secondary | ICD-10-CM | POA: Diagnosis not present

## 2020-11-25 DIAGNOSIS — E1122 Type 2 diabetes mellitus with diabetic chronic kidney disease: Secondary | ICD-10-CM | POA: Insufficient documentation

## 2020-11-25 DIAGNOSIS — Z951 Presence of aortocoronary bypass graft: Secondary | ICD-10-CM | POA: Insufficient documentation

## 2020-11-25 DIAGNOSIS — R200 Anesthesia of skin: Secondary | ICD-10-CM | POA: Diagnosis not present

## 2020-11-25 DIAGNOSIS — G459 Transient cerebral ischemic attack, unspecified: Principal | ICD-10-CM | POA: Insufficient documentation

## 2020-11-25 DIAGNOSIS — Z0000 Encounter for general adult medical examination without abnormal findings: Secondary | ICD-10-CM | POA: Diagnosis not present

## 2020-11-25 DIAGNOSIS — R531 Weakness: Secondary | ICD-10-CM | POA: Diagnosis not present

## 2020-11-25 DIAGNOSIS — R29898 Other symptoms and signs involving the musculoskeletal system: Secondary | ICD-10-CM | POA: Diagnosis not present

## 2020-11-25 DIAGNOSIS — E875 Hyperkalemia: Secondary | ICD-10-CM | POA: Diagnosis not present

## 2020-11-25 DIAGNOSIS — I130 Hypertensive heart and chronic kidney disease with heart failure and stage 1 through stage 4 chronic kidney disease, or unspecified chronic kidney disease: Secondary | ICD-10-CM | POA: Diagnosis not present

## 2020-11-25 DIAGNOSIS — I6623 Occlusion and stenosis of bilateral posterior cerebral arteries: Secondary | ICD-10-CM | POA: Diagnosis not present

## 2020-11-25 DIAGNOSIS — Z87891 Personal history of nicotine dependence: Secondary | ICD-10-CM | POA: Diagnosis not present

## 2020-11-25 DIAGNOSIS — I6523 Occlusion and stenosis of bilateral carotid arteries: Secondary | ICD-10-CM | POA: Diagnosis not present

## 2020-11-25 DIAGNOSIS — Z79899 Other long term (current) drug therapy: Secondary | ICD-10-CM | POA: Diagnosis not present

## 2020-11-25 DIAGNOSIS — Z20822 Contact with and (suspected) exposure to covid-19: Secondary | ICD-10-CM | POA: Insufficient documentation

## 2020-11-25 DIAGNOSIS — R42 Dizziness and giddiness: Secondary | ICD-10-CM | POA: Diagnosis not present

## 2020-11-25 DIAGNOSIS — M6281 Muscle weakness (generalized): Secondary | ICD-10-CM | POA: Insufficient documentation

## 2020-11-25 LAB — DIFFERENTIAL
Abs Immature Granulocytes: 0.04 10*3/uL (ref 0.00–0.07)
Basophils Absolute: 0.1 10*3/uL (ref 0.0–0.1)
Basophils Relative: 1 %
Eosinophils Absolute: 0.4 10*3/uL (ref 0.0–0.5)
Eosinophils Relative: 4 %
Immature Granulocytes: 0 %
Lymphocytes Relative: 19 %
Lymphs Abs: 1.7 10*3/uL (ref 0.7–4.0)
Monocytes Absolute: 1.4 10*3/uL — ABNORMAL HIGH (ref 0.1–1.0)
Monocytes Relative: 15 %
Neutro Abs: 5.6 10*3/uL (ref 1.7–7.7)
Neutrophils Relative %: 61 %

## 2020-11-25 LAB — CBC
HCT: 42.1 % (ref 39.0–52.0)
Hemoglobin: 14.3 g/dL (ref 13.0–17.0)
MCH: 30.7 pg (ref 26.0–34.0)
MCHC: 34 g/dL (ref 30.0–36.0)
MCV: 90.3 fL (ref 80.0–100.0)
Platelets: 398 10*3/uL (ref 150–400)
RBC: 4.66 MIL/uL (ref 4.22–5.81)
RDW: 13.6 % (ref 11.5–15.5)
WBC: 9.1 10*3/uL (ref 4.0–10.5)
nRBC: 0 % (ref 0.0–0.2)

## 2020-11-25 LAB — COMPREHENSIVE METABOLIC PANEL
ALT: 16 U/L (ref 0–44)
AST: 23 U/L (ref 15–41)
Albumin: 3.9 g/dL (ref 3.5–5.0)
Alkaline Phosphatase: 60 U/L (ref 38–126)
Anion gap: 10 (ref 5–15)
BUN: 41 mg/dL — ABNORMAL HIGH (ref 8–23)
CO2: 21 mmol/L — ABNORMAL LOW (ref 22–32)
Calcium: 9.4 mg/dL (ref 8.9–10.3)
Chloride: 101 mmol/L (ref 98–111)
Creatinine, Ser: 1.86 mg/dL — ABNORMAL HIGH (ref 0.61–1.24)
GFR, Estimated: 37 mL/min — ABNORMAL LOW (ref >60)
Glucose, Bld: 118 mg/dL — ABNORMAL HIGH (ref 70–99)
Potassium: 5.3 mmol/L — ABNORMAL HIGH (ref 3.5–5.1)
Sodium: 132 mmol/L — ABNORMAL LOW (ref 135–145)
Total Bilirubin: 0.5 mg/dL (ref 0.3–1.2)
Total Protein: 8.6 g/dL — ABNORMAL HIGH (ref 6.5–8.1)

## 2020-11-25 LAB — GLUCOSE, CAPILLARY: Glucose-Capillary: 135 mg/dL — ABNORMAL HIGH (ref 70–99)

## 2020-11-25 LAB — RESP PANEL BY RT-PCR (FLU A&B, COVID) ARPGX2
Influenza A by PCR: NEGATIVE
Influenza B by PCR: NEGATIVE
SARS Coronavirus 2 by RT PCR: NEGATIVE

## 2020-11-25 LAB — PROTIME-INR
INR: 1 (ref 0.8–1.2)
Prothrombin Time: 13.1 seconds (ref 11.4–15.2)

## 2020-11-25 LAB — CBG MONITORING, ED: Glucose-Capillary: 119 mg/dL — ABNORMAL HIGH (ref 70–99)

## 2020-11-25 LAB — APTT: aPTT: 53 seconds — ABNORMAL HIGH (ref 24–36)

## 2020-11-25 MED ORDER — ASPIRIN EC 81 MG PO TBEC
81.0000 mg | DELAYED_RELEASE_TABLET | Freq: Every day | ORAL | Status: DC
  Administered 2020-11-25 – 2020-11-26 (×2): 81 mg via ORAL
  Filled 2020-11-25 (×2): qty 1

## 2020-11-25 MED ORDER — STROKE: EARLY STAGES OF RECOVERY BOOK: Freq: Once | Status: AC

## 2020-11-25 MED ORDER — ALBUTEROL SULFATE (2.5 MG/3ML) 0.083% IN NEBU: 3.0000 mL | INHALATION_SOLUTION | Freq: Four times a day (QID) | RESPIRATORY_TRACT | Status: DC | PRN

## 2020-11-25 MED ORDER — SODIUM CHLORIDE 0.9% FLUSH
3.0000 mL | Freq: Once | INTRAVENOUS | Status: AC
Start: 2020-11-25 — End: 2020-11-25
  Administered 2020-11-25: 3 mL via INTRAVENOUS

## 2020-11-25 MED ORDER — INSULIN ASPART 100 UNIT/ML IJ SOLN: 0.0000 [IU] | Freq: Three times a day (TID) | INTRAMUSCULAR | Status: DC

## 2020-11-25 MED ORDER — HEPARIN SODIUM (PORCINE) 5000 UNIT/ML IJ SOLN
5000.0000 [IU] | Freq: Two times a day (BID) | INTRAMUSCULAR | Status: DC
  Administered 2020-11-25 – 2020-11-26 (×2): 5000 [IU] via SUBCUTANEOUS
  Filled 2020-11-25 (×2): qty 1

## 2020-11-25 MED ORDER — CLOPIDOGREL BISULFATE 75 MG PO TABS
300.0000 mg | ORAL_TABLET | Freq: Once | ORAL | Status: AC
  Administered 2020-11-25: 300 mg via ORAL
  Filled 2020-11-25: qty 4

## 2020-11-25 MED ORDER — SODIUM CHLORIDE 0.9 % IV SOLN: INTRAVENOUS | Status: AC

## 2020-11-25 MED ORDER — ACETAMINOPHEN 650 MG RE SUPP: 650.0000 mg | RECTAL | Status: DC | PRN

## 2020-11-25 MED ORDER — SODIUM ZIRCONIUM CYCLOSILICATE 10 G PO PACK
10.0000 g | PACK | Freq: Once | ORAL | Status: DC
  Filled 2020-11-25 (×2): qty 1

## 2020-11-25 MED ORDER — ATORVASTATIN CALCIUM 20 MG PO TABS
40.0000 mg | ORAL_TABLET | Freq: Every day | ORAL | Status: DC
  Administered 2020-11-25: 21:00:00 40 mg via ORAL
  Filled 2020-11-25: qty 2

## 2020-11-25 MED ORDER — HYDRALAZINE HCL 50 MG PO TABS
50.0000 mg | ORAL_TABLET | Freq: Three times a day (TID) | ORAL | Status: DC
  Administered 2020-11-25: 50 mg via ORAL
  Filled 2020-11-25 (×2): qty 1

## 2020-11-25 MED ORDER — IOHEXOL 350 MG/ML SOLN
75.0000 mL | Freq: Once | INTRAVENOUS | Status: AC | PRN
  Administered 2020-11-25: 75 mL via INTRAVENOUS

## 2020-11-25 MED ORDER — CARVEDILOL 3.125 MG PO TABS
3.1250 mg | ORAL_TABLET | Freq: Two times a day (BID) | ORAL | Status: DC
  Administered 2020-11-25 – 2020-11-26 (×2): 3.125 mg via ORAL
  Filled 2020-11-25 (×2): qty 1

## 2020-11-25 MED ORDER — ACETAMINOPHEN 325 MG PO TABS: 650.0000 mg | ORAL_TABLET | ORAL | Status: DC | PRN

## 2020-11-25 MED ORDER — ACETAMINOPHEN 160 MG/5ML PO SOLN
650.0000 mg | ORAL | Status: DC | PRN
  Filled 2020-11-25: qty 20.3

## 2020-11-25 MED ORDER — SENNOSIDES-DOCUSATE SODIUM 8.6-50 MG PO TABS: 1.0000 | ORAL_TABLET | Freq: Every evening | ORAL | Status: DC | PRN

## 2020-11-25 MED ORDER — CLOPIDOGREL BISULFATE 75 MG PO TABS
75.0000 mg | ORAL_TABLET | Freq: Every day | ORAL | Status: DC
  Administered 2020-11-26: 75 mg via ORAL
  Filled 2020-11-25: qty 1

## 2020-11-25 NOTE — ED Notes (Signed)
Patient in MRI at this time. 

## 2020-11-25 NOTE — Consult Note (Signed)
Neurology Consultation Reason for Consult: Left numbness Referring Physician: Irving Burton  CC: Left-sided numbness  History is obtained from: Patient  HPI: Benjamin Valentine is a 75 y.o. male with a history of coronary artery disease, hypertension who presents with left-sided numbness.  Of note, he has gout in his left leg, and has been feeling like it was getting weaker over the past few days but then he noticed weakness and numbness of his arm as well this afternoon.   LKW: Couple of days ago tpa given?: no, outside of window Premorbid modified rankin scale: Two   ROS: A 14 point ROS was performed and is negative except as noted in the HPI.     Past Medical History:  Diagnosis Date   Arthritis    Coronary artery disease    Gout    Hypertension      Family history: No history of stroke   Social History:  reports that he has quit smoking. He has never used smokeless tobacco. He reports that he does not drink alcohol and does not use drugs.   Exam: Current vital signs: BP 140/66   Pulse 75   Temp 98 F (36.7 C)   Resp (!) 22   SpO2 96%  Vital signs in last 24 hours: Temp:  [98 F (36.7 C)] 98 F (36.7 C) (10/13 1442) Pulse Rate:  [60-105] 75 (10/13 1645) Resp:  [13-22] 22 (10/13 1645) BP: (122-159)/(60-85) 140/66 (10/13 1645) SpO2:  [96 %-100 %] 96 % (10/13 1645)   Physical Exam  Constitutional: Appears well-developed and well-nourished.  Psych: Affect appropriate to situation Eyes: No scleral injection HENT: No OP obstruction MSK: no joint deformities.  Cardiovascular: Normal rate and regular rhythm.  Respiratory: Effort normal, non-labored breathing GI: Soft.  No distension. There is no tenderness.  Skin: WDI  Neuro: Mental Status: Patient is awake, alert, oriented to person, place, month, year, and situation. Patient is able to give a clear and coherent history. No signs of aphasia or neglect\ Cranial Nerves: II: Visual Fields are full. Pupils are  equal, round, and reactive to light.   III,IV, VI: EOMI without ptosis or diploplia.  V: Facial sensation is symmetric to temperature VII: Facial movement is symmetric.  VIII: hearing is intact to voice X: Uvula elevates symmetrically XI: Shoulder shrug is symmetric. XII: tongue is midline without atrophy or fasciculations.  Motor: Tone is normal. Bulk is normal. 5/5 strength was present in bilateral arms and right leg, he does have 4/5 weakness of left leg, though there is some pain limitations well. Sensory: Sensation is diminished in the left arm Cerebellar: FNF and HKS are intact on the right, not clearly ataxic but it is slightly slow in the left arm      I have reviewed labs in epic and the results pertinent to this consultation are: Creatinine 1.86  I have reviewed the images obtained: CT head-unremarkable, no LVO  Impression: 75 year old male with acute left arm numbness and weakness.  His symptoms are relatively mild, and though my suspicion is that the left leg problems for the past few days are more gout related, I cannot exclude that the leg weakness was actually related to his current acute intracranial process.  Therefore, at this point I think that the risks of tPA actually outweighs the potential benefit.  Recommendations: - HgbA1c, fasting lipid panel - MRI of the brain without contrast - Frequent neuro checks - Echocardiogram - Prophylactic therapy-Antiplatelet med: Aspirin - dose 81mg  and plavix  75mg  daily   - Risk factor modification - Telemetry monitoring - PT consult, OT consult, Speech consult     , MD [facility] [phone]  If 7pm- 7am, please page neurology on call as listed in AMION.

## 2020-11-25 NOTE — Code Documentation (Signed)
Stroke Response Nurse Documentation Code Documentation  Benjamin Valentine is a 75 y.o. male arriving to [facility] ED via Private Vehicle on 11/25/2020. Code stroke was activated by ED triage RN.   Patient from home where he was LKW at 1400 and now complaining of feeling dizzy and having some left sided numbness to arm and leg.   Stroke team at the bedside on patient arrival. Labs drawn and patient cleared for CT by Dr. Katrinka Blazing. Patient to CT with team. NIHSS 2, see documentation for details and code stroke times.  The following imaging was completed:  CT Head. Patient is not a candidate for IV Thrombolytic per Dr Amada Jupiter.  Care/Plan: CTA to be done and admit for stroke workup.   Bedside handoff with ED RN Amber.    Beverly Milch Stroke Response RN

## 2020-11-25 NOTE — ED Notes (Signed)
Pt taken to CT by EDT Faith at this time.

## 2020-11-25 NOTE — H&P (Signed)
History and Physical    Benjamin Valentine BTD:[id number] DOB: 12/05/1945 DOA: 11/25/2020  PCP: Corky Downs, MD (Confirm with patient/family/NH records and if not entered, this has to be entered at [facility] point of entry) Patient coming from: Home  I have personally briefly reviewed patient's old medical records in [facility]  Chief Complaint: Left arm numbness, feeling dizzy.  HPI: ADEMOLA VERT is a 75 y.o. male with medical history significant of chronic systolic CHF LVEF 25 to 30% echocardiogram October 2022, CAD status post CABG, CKD stage IIIa, HTN, HLD, gout, presented with new onset of left arm numbness and weakness and feeling lightheaded.  Symptoms started after patient eating lunch, then" left arm went to sleep" could not hold things with left hand or wrist left arm for few moments and started to have tingling sensation of the whole left arm and hand.  Weakness resolved but numbness persistent for> 1 hour.  Patient was recently hospitalized for decompensated CHF, underwent cardiac cath on 10/03 and found multiple severe stenosis recommend medical management.  ED Course: Vital signs stable, no arrhythmia on telemetry monitoring, NIHSS= 2, code stroke called, considered to be a noncandidate for tPA given the significant improvement of symptoms.  CT head negative for acute findings, CT angiogram negative for major stenosis.  Creatinine 1.8, bicarb 21, potassium 5.3.  Review of Systems: As per HPI otherwise 14 point review of systems negative.    Past Medical History:  Diagnosis Date   Arthritis    Coronary artery disease    Gout    Hypertension     Past Surgical History:  Procedure Laterality Date   CARDIAC SURGERY     CABG 2002   LAPAROSCOPIC APPENDECTOMY N/A 08/11/2018   Procedure: APPENDECTOMY LAPAROSCOPIC;  Surgeon: Henrene Dodge, MD;  Location: [facility] ORS;  Service: General;  Laterality: N/A;   RIGHT/LEFT HEART CATH AND CORONARY/GRAFT ANGIOGRAPHY N/A 11/15/2020    Procedure: RIGHT/LEFT HEART CATH AND CORONARY/GRAFT ANGIOGRAPHY;  Surgeon: Iran Ouch, MD;  Location: [facility] INVASIVE CV LAB;  Service: Cardiovascular;  Laterality: N/A;     reports that he has quit smoking. He has never used smokeless tobacco. He reports that he does not drink alcohol and does not use drugs.  No Known Allergies  No family history on file.   Prior to Admission medications   Medication Sig Start Date End Date Taking? Authorizing Provider  atorvastatin (LIPITOR) 40 MG tablet Take 1 tablet (40 mg total) by mouth daily. 11/24/20  Yes [name], Renda Rolls, MD  carvedilol (COREG) 3.125 MG tablet Take 1 tablet (3.125 mg total) by mouth 2 (two) times daily with a meal. 11/24/20  Yes [name], Renda Rolls, MD  dapagliflozin propanediol (FARXIGA) 5 MG TABS tablet Take 1 tablet (5 mg total) by mouth daily. 11/18/20  Yes Marrion Coy, MD  hydrALAZINE (APRESOLINE) 50 MG tablet Take 1 tablet (50 mg total) by mouth 3 (three) times daily. 11/24/20  Yes [name], Renda Rolls, MD  albuterol (VENTOLIN HFA) 108 (90 Base) MCG/ACT inhaler Inhale 2 puffs into the lungs every 6 (six) hours as needed for wheezing or shortness of breath. 06/06/18   Emily Filbert, MD  allopurinol (ZYLOPRIM) 300 MG tablet TAKE 1 TABLET BY MOUTH EVERY DAY Patient not taking: Reported on 11/25/2020 10/11/20   Corky Downs, MD  aspirin EC 81 MG tablet Take 81 mg by mouth daily. Patient not taking: Reported on 11/25/2020    [provider]    Physical Exam: Vitals:   11/25/20 1600 11/25/20  1615 11/25/20 1630 11/25/20 1645  BP:  124/85 (!) 159/78 140/66  Pulse: 77 78 60 75  Resp: 13 18 17  (!) 22  Temp:      SpO2: 97% 97% 96% 96%    Constitutional: NAD, calm, comfortable Vitals:   11/25/20 1600 11/25/20 1615 11/25/20 1630 11/25/20 1645  BP:  124/85 (!) 159/78 140/66  Pulse: 77 78 60 75  Resp: 13 18 17  (!) 22  Temp:      SpO2: 97% 97% 96% 96%   Eyes: PERRL, lids and conjunctivae normal ENMT: Mucous membranes are  moist. Posterior pharynx clear of any exudate or lesions.Normal dentition.  Neck: normal, supple, no masses, no thyromegaly Respiratory: clear to auscultation bilaterally, no wheezing, no crackles. Normal respiratory effort. No accessory muscle use.  Cardiovascular: Regular rate and rhythm, no murmurs / rubs / gallops. No extremity edema. 2+ pedal pulses. No carotid bruits.  Abdomen: no tenderness, no masses palpated. No hepatosplenomegaly. Bowel sounds positive.  Musculoskeletal: no clubbing / cyanosis. No joint deformity upper and lower extremities. Good ROM, no contractures. Normal muscle tone.  Skin: no rashes, lesions, ulcers. No induration Neurologic: CN 2-12 grossly intact.  Light touch sensation decreased on left upper and lower arm and left hand and fingers compared to the right side, DTR normal. Strength 5/5 in all 4.  Psychiatric: Normal judgment and insight. Alert and oriented x 3. Normal mood.     Labs on Admission: I have personally reviewed following labs and imaging studies  CBC: Recent Labs  Lab 11/25/20 1505  WBC 9.1  NEUTROABS 5.6  HGB 14.3  HCT 42.1  MCV 90.3  PLT 398   Basic Metabolic Panel: Recent Labs  Lab 11/25/20 1505  NA 132*  K 5.3*  CL 101  CO2 21*  GLUCOSE 118*  BUN 41*  CREATININE 1.86*  CALCIUM 9.4   GFR: Estimated Creatinine Clearance: 37.7 mL/min (A) (by C-G formula based on SCr of 1.86 mg/dL (H)). Liver Function Tests: Recent Labs  Lab 11/25/20 1505  AST 23  ALT 16  ALKPHOS 60  BILITOT 0.5  PROT 8.6*  ALBUMIN 3.9   No results for input(s): LIPASE, AMYLASE in the last 168 hours. No results for input(s): AMMONIA in the last 168 hours. Coagulation Profile: Recent Labs  Lab 11/25/20 1505  INR 1.0   Cardiac Enzymes: No results for input(s): CKTOTAL, CKMB, CKMBINDEX, TROPONINI in the last 168 hours. BNP (last 3 results) No results for input(s): PROBNP in the last 8760 hours. HbA1C: No results for input(s): HGBA1C in the last  72 hours. CBG: Recent Labs  Lab 11/25/20 1438  GLUCAP 119*   Lipid Profile: No results for input(s): CHOL, HDL, LDLCALC, TRIG, CHOLHDL, LDLDIRECT in the last 72 hours. Thyroid Function Tests: No results for input(s): TSH, T4TOTAL, FREET4, T3FREE, THYROIDAB in the last 72 hours. Anemia Panel: No results for input(s): VITAMINB12, FOLATE, FERRITIN, TIBC, IRON, RETICCTPCT in the last 72 hours. Urine analysis:    Component Value Date/Time   COLORURINE YELLOW 11/11/2020 0339   APPEARANCEUR CLEAR 11/11/2020 0339   LABSPEC 1.010 11/11/2020 0339   PHURINE 6.0 11/11/2020 0339   GLUCOSEU NEGATIVE 11/11/2020 0339   HGBUR NEGATIVE 11/11/2020 0339   BILIRUBINUR NEGATIVE 11/11/2020 0339   KETONESUR NEGATIVE 11/11/2020 0339   PROTEINUR 30 (A) 11/11/2020 0339   NITRITE NEGATIVE 11/11/2020 0339   LEUKOCYTESUR NEGATIVE 11/11/2020 0339    Radiological Exams on Admission: CT HEAD CODE STROKE WO CONTRAST  Result Date: 11/25/2020 CLINICAL DATA:  Code  stroke. Left upper extremity weakness, wheezy feeling EXAM: CT HEAD WITHOUT CONTRAST TECHNIQUE: Contiguous axial images were obtained from the base of the skull through the vertex without intravenous contrast. COMPARISON:  CT head 09/28/2015 FINDINGS: Brain: There is no acute intracranial hemorrhage, extra-axial fluid collection, or acute infarct. There is mild parenchymal volume loss with commensurate enlargement of the ventricular system. There is mild chronic white matter microangiopathy. There is no mass lesion. There is no midline shift. Vascular: There is calcification of the bilateral cavernous ICAs. There is no dense vessel. Skull: Normal. Negative for fracture or focal lesion. Sinuses/Orbits: The imaged paranasal sinuses are clear. The imaged globes and orbits are unremarkable. Other: None. ASPECTS [facility] Stroke Program Early CT Score) - Ganglionic level infarction (caudate, lentiform nuclei, internal capsule, insula, M1-M3 cortex): 7 -  Supraganglionic infarction (M4-M6 cortex): 3 Total score (0-10 with 10 being normal): 10 IMPRESSION: 1. No acute intracranial hemorrhage or infarct. 2. ASPECTS is 10. These results were called by telephone at the time of interpretation on 11/25/2020 at 3:02 pm to provider Dr Sidney Ace, who verbally acknowledged these results. Electronically Signed   By: Lesia Hausen M.D.   On: 11/25/2020 15:03   CT ANGIO HEAD NECK W WO CM (CODE STROKE)  Result Date: 11/25/2020 CLINICAL DATA:  Left leg weakness, left arm numbness, code stroke follow-up EXAM: CT ANGIOGRAPHY HEAD AND NECK TECHNIQUE: Multidetector CT imaging of the head and neck was performed using the standard protocol during bolus administration of intravenous contrast. Multiplanar CT image reconstructions and MIPs were obtained to evaluate the vascular anatomy. Carotid stenosis measurements (when applicable) are obtained utilizing NASCET criteria, using the distal internal carotid diameter as the denominator. CONTRAST:  69mL OMNIPAQUE IOHEXOL 350 MG/ML SOLN COMPARISON:  None. FINDINGS: CTA NECK Aortic arch: Mixed plaque along the arch and patent great vessel origins. Right carotid system: Patent. Atherosclerotic wall thickening along the common carotid. Mixed plaque along the proximal internal carotid with less than 50% stenosis. Left carotid system: Patent. Eccentric noncalcified plaque along the common carotid causing nearly 50% stenosis. Mixed plaque along the proximal internal carotid causing less than 50% stenosis. Vertebral arteries: Left vertebral artery is patent and dominant. Calcified plaque at the left vertebral origin with mild stenosis. There is high-grade stenosis of the distal left V1 segment. Diminutive right vertebral artery is poorly visualized in the distal V2 and V3 segments. There is plaque at the origin with possible high-grade stenosis. Skeleton: Degenerative changes of the cervical spine with multilevel canal and foraminal stenosis. Other  neck: Unremarkable. Upper chest: Mild emphysema.  No apical lung mass. Review of the MIP images confirms the above findings CTA HEAD Anterior circulation: Intracranial internal carotid arteries are patent plaque causes mild stenosis of the distal right petrous portion. Additional calcified plaque along cavernous and supraclinoid portions causing mild to moderate stenosis. Anterior and middle cerebral arteries are patent. There is likely atherosclerotic irregularity of medium and small vessel branches. Posterior circulation: Intracranial left vertebral artery is patent with mild atherosclerotic irregularity and stenoses. Intracranial right vertebral artery is not definitely seen. Basilar artery is patent and terminates as left PCA. Fetal right PCA. The posterior cerebral arteries are patent. There is focal marked stenosis of the right P2 PCA. Focal moderate stenosis of the left P2 PCA. Venous sinuses: Patent as allowed by contrast bolus timing. Review of the MIP images confirms the above findings IMPRESSION: No large vessel occlusion. Plaque along the proximal right ICA causing less than 50% stenosis. Plaque along the left  common carotid causing nearly 50% stenosis. Plaque along the proximal left ICA causing less than 50% stenosis. Diminutive right vertebral artery with possible high-grade stenosis at the origin. Intracranial portion is not visualized. This is age-indeterminate. Multifocal intracranial atherosclerosis. Electronically Signed   By: Guadlupe Spanish M.D.   On: 11/25/2020 16:18    EKG: Independently reviewed.  Sinus, first-degree AV block, LVH.  Assessment/Plan Active Problems:   TIA (transient ischemic attack)  (please populate well all problems here in Problem List. (For example, if patient is on BP meds at home and you resume or decide to hold them, it is a problem that needs to be her. Same for CAD, COPD, HLD and so on)  TIA -With left upper extremity paresis and paresthesia, symptoms are  mild and improving. -CT head negative for acute bleeding or structural changes, CT angiogram negative for major stenosis.  MRI pending. -Continue aspirin, neurology to decide whether to add Plavix in this case, continue statin.  LDL controlled, on recent lipid panel done 2 weeks ago.  A1c pending. -Given the significant history of chronic systolic CHF, expect elevated blood pressure rule because systolic CHF decompensation and pulmonary edema risk, will not enforce permissive hypertension in this case.  Continue Coreg and hydralazine.  Chronic systolic CHF -Euvolemic, continue CHF/BP medication as mentioned above.  Mild hyperkalemia -No EKG changes, 1 dose of Lokelma.  Recheck level tomorrow.  CKD stage IIIa -Received IV contrast today, gave about dental dehydration for 12 hours for kidney protection.  Monitor volume status closely.  IIDM -Hold the [place], start sliding scale.  Gout.  COPD -No acute issue, continue home meds.  DVT prophylaxis: Lovenox Code Status: Full code Family Communication: Daughter at bedside Disposition Plan: Expect less than 2 midnight hospital stay Consults called: Neurology Dr. Amada Jupiter Admission status: Telemetry observation   Emeline General MD [facility] Pager [phone]  11/25/2020, 5:12 PM

## 2020-11-25 NOTE — ED Notes (Signed)
CODE STROKE called to [facility] spoke to [place]

## 2020-11-25 NOTE — Progress Notes (Signed)
Subjective:   Benjamin Valentine is a 75 y.o. male who presents for an Initial Medicare Annual Wellness Visit.  I discussed the limitations of evaluation and management by telemedicine and the availability of in person appointments. Patient expressed understanding and agreed to proceed.   Visit performed using audio  Patient:home Provider:home  Review of Systems    Defer to provider Cardiac Risk Factors include: hypertension;advanced age (>45men, >42 women);male gender     Objective:    Today's Vitals   11/25/20 1448  PainSc: 0-No pain   There is no height or weight on file to calculate BMI.  Advanced Directives 11/25/2020 11/15/2020 11/11/2020 04/20/2020 11/03/2018 09/09/2018 08/11/2018  Does Patient Have a Medical Advance Directive? No No No No No No No  Would patient like information on creating a medical advance directive? No - Patient declined No - Patient declined No - Patient declined - No - Patient declined No - Patient declined No - Patient declined    Current Medications (verified) Facility-Administered Encounter Medications as of 11/25/2020  Medication   sodium chloride flush (NS) 0.9 % injection 3 mL   Outpatient Encounter Medications as of 11/25/2020  Medication Sig   albuterol (VENTOLIN HFA) 108 (90 Base) MCG/ACT inhaler Inhale 2 puffs into the lungs every 6 (six) hours as needed for wheezing or shortness of breath.   allopurinol (ZYLOPRIM) 300 MG tablet TAKE 1 TABLET BY MOUTH EVERY DAY   aspirin EC 81 MG tablet Take 81 mg by mouth daily.   atorvastatin (LIPITOR) 40 MG tablet Take 1 tablet (40 mg total) by mouth daily.   carvedilol (COREG) 3.125 MG tablet Take 1 tablet (3.125 mg total) by mouth 2 (two) times daily with a meal.   dapagliflozin propanediol (FARXIGA) 5 MG TABS tablet Take 1 tablet (5 mg total) by mouth daily.   hydrALAZINE (APRESOLINE) 50 MG tablet Take 1 tablet (50 mg total) by mouth every 8 (eight) hours.   hydrALAZINE (APRESOLINE) 50 MG tablet Take 1  tablet (50 mg total) by mouth 3 (three) times daily.    Allergies (verified) Patient has no known allergies.   History: Past Medical History:  Diagnosis Date   Arthritis    Coronary artery disease    Gout    Hypertension    Past Surgical History:  Procedure Laterality Date   CARDIAC SURGERY     CABG 2002   LAPAROSCOPIC APPENDECTOMY N/A 08/11/2018   Procedure: APPENDECTOMY LAPAROSCOPIC;  Surgeon: Henrene Dodge, MD;  Location: [facility] ORS;  Service: General;  Laterality: N/A;   RIGHT/LEFT HEART CATH AND CORONARY/GRAFT ANGIOGRAPHY N/A 11/15/2020   Procedure: RIGHT/LEFT HEART CATH AND CORONARY/GRAFT ANGIOGRAPHY;  Surgeon: Iran Ouch, MD;  Location: [facility] INVASIVE CV [facility];  Service: Cardiovascular;  Laterality: N/A;   History reviewed. No pertinent family history. Social History   Socioeconomic History   Marital status: Widowed    Spouse name: Not on file   Number of children: Not on file   Years of education: Not on file   Highest education level: Not on file  Occupational History   Not on file  Tobacco Use   Smoking status: Former   Smokeless tobacco: Never  Vaping Use   Vaping Use: Never used  Substance and Sexual Activity   Alcohol use: No   Drug use: No   Sexual activity: Not on file  Other Topics Concern   Not on file  Social History Narrative   Not on file   Social Determinants of Health  Financial Resource Strain: Low Risk    Difficulty of Paying Living Expenses: Not very hard  Food Insecurity: No Food Insecurity   Worried About Programme researcher, broadcasting/film/video in the Last Year: Never true   Ran Out of Food in the Last Year: Never true  Transportation Needs: No Transportation Needs   Lack of Transportation (Medical): No   Lack of Transportation (Non-Medical): No  Physical Activity: Insufficiently Active   Days of Exercise per Week: 2 days   Minutes of Exercise per Session: 20 min  Stress: Stress Concern Present   Feeling of Stress : To some extent  Social  Connections: Socially Isolated   Frequency of Communication with Friends and Family: More than three times a week   Frequency of Social Gatherings with Friends and Family: More than three times a week   Attends Religious Services: Never   Database administrator or Organizations: No   Attends Banker Meetings: Never   Marital Status: Widowed    Tobacco Counseling Counseling given: Not Answered   Clinical Intake:  Pre-visit preparation completed: Yes  Pain : No/denies pain Pain Score: 0-No pain     Nutritional Risks: None Diabetes: No  How often do you need to have someone help you when you read instructions, pamphlets, or other written materials from your doctor or pharmacy?: 1 - Never What is the last grade level you completed in school?: unsure  Diabetic?No  Interpreter Needed?: No  Information entered by :: Melody Comas, CMA   Activities of Daily Living In your present state of health, do you have any difficulty performing the following activities: 11/25/2020 11/12/2020  Hearing? N N  Vision? N N  Difficulty concentrating or making decisions? N N  Walking or climbing stairs? N N  Dressing or bathing? N N  Doing errands, shopping? N N  Preparing Food and eating ? N -  Using the Toilet? N -  In the past six months, have you accidently leaked urine? N -  Do you have problems with loss of bowel control? N -  Managing your Medications? N -  Managing your Finances? N -  Housekeeping or managing your Housekeeping? N -  Some recent data might be hidden    Patient Care Team: Corky Downs, MD as PCP - General (Internal Medicine) Antonieta Iba, MD as PCP - Cardiology (Cardiology)  Indicate any recent Medical Services you may have received from other than [facility] providers in the past year (date may be approximate).     Assessment:   This is a routine wellness examination for Benjamin Valentine.  Hearing/Vision screen No results found.  Dietary issues and  exercise activities discussed: Current Exercise Habits: The patient does not participate in regular exercise at present, Exercise limited by: cardiac condition(s)   Goals Addressed   None    Depression Screen PHQ 2/9 Scores 11/25/2020 11/25/2020 11/24/2020  PHQ - 2 Score 0 0 0    Fall Risk Fall Risk  11/25/2020  Falls in the past year? 0  Number falls in past yr: 0  Injury with Fall? 0  Risk for fall due to : No Fall Risks  Follow up Falls evaluation completed    FALL RISK PREVENTION PERTAINING TO THE HOME:  Any stairs in or around the home? Yes  If so, are there any without handrails? No  Home free of loose throw rugs in walkways, pet beds, electrical cords, etc? No  Adequate lighting in your home to reduce risk of  falls? No   ASSISTIVE DEVICES UTILIZED TO PREVENT FALLS:  Life alert? No  Use of a cane, walker or w/c? No  Grab bars in the bathroom? Yes  Shower chair or bench in shower? Yes  Elevated toilet seat or a handicapped toilet? No   TIMED UP AND GO:  Was the test performed? No .  Length of time to ambulate- NA   Gait slow and steady without use of assistive device  Cognitive Function: MMSE - Mini Mental State Exam 11/25/2020  Not completed: Unable to complete     6CIT Screen 11/25/2020  What Year? 0 points  What month? 0 points  What time? 0 points  Count back from 20 0 points  Months in reverse 0 points  Repeat phrase 0 points  Total Score 0    Immunizations  There is no immunization history on file for this patient.  TDAP status: Due, Education has been provided regarding the importance of this vaccine. Advised may receive this vaccine at local pharmacy or Health Dept. Aware to provide a copy of the vaccination record if obtained from local pharmacy or Health Dept. Verbalized acceptance and understanding.  Flu Vaccine status: Declined, Education has been provided regarding the importance of this vaccine but patient still declined. Advised may  receive this vaccine at local pharmacy or Health Dept. Aware to provide a copy of the vaccination record if obtained from local pharmacy or Health Dept. Verbalized acceptance and understanding.  Pneumococcal vaccine status: Declined,  Education has been provided regarding the importance of this vaccine but patient still declined. Advised may receive this vaccine at local pharmacy or Health Dept. Aware to provide a copy of the vaccination record if obtained from local pharmacy or Health Dept. Verbalized acceptance and understanding.   Covid-19 vaccine status: Declined, Education has been provided regarding the importance of this vaccine but patient still declined. Advised may receive this vaccine at local pharmacy or Health Dept.or vaccine clinic. Aware to provide a copy of the vaccination record if obtained from local pharmacy or Health Dept. Verbalized acceptance and understanding.  Qualifies for Shingles Vaccine? No   Zostavax completed No   Shingrix Completed?: No.    Education has been provided regarding the importance of this vaccine. Patient has been advised to call insurance company to determine out of pocket expense if they have not yet received this vaccine. Advised may also receive vaccine at local pharmacy or Health Dept. Verbalized acceptance and understanding.  Screening Tests Health Maintenance  Topic Date Due   COVID-19 Vaccine (1) Never done   Hepatitis C Screening  Never done   TETANUS/TDAP  Never done   Zoster Vaccines- Shingrix (1 of 2) Never done   INFLUENZA VACCINE  Never done   HPV VACCINES  Aged Out    Health Maintenance  Health Maintenance Due  Topic Date Due   COVID-19 Vaccine (1) Never done   Hepatitis C Screening  Never done   TETANUS/TDAP  Never done   Zoster Vaccines- Shingrix (1 of 2) Never done   INFLUENZA VACCINE  Never done    Colorectal cancer screening: No longer required.   Lung Cancer Screening: (Low Dose CT Chest recommended if Age 55-80 years,  30 pack-year currently smoking OR have quit w/in 15years.) does not qualify.   Lung Cancer Screening Referral: NA  Additional Screening:  Hepatitis C Screening: does not qualify; Completed No  Vision Screening: Recommended annual ophthalmology exams for early detection of glaucoma and other disorders of the  eye. Is the patient up to date with their annual eye exam?  No  Who is the provider or what is the name of the office in which the patient attends annual eye exams? Patient all If pt is not established with a provider, would they like to be referred to a provider to establish care? No .   Dental Screening: Recommended annual dental exams for proper oral hygiene  Community Resource Referral / Chronic Care Management: CRR required this visit?  No   CCM required this visit?  No      Plan:     I have personally reviewed and noted the following in the patient's chart:   Medical and social history Use of alcohol, tobacco or illicit drugs  Current medications and supplements including opioid prescriptions. Patient is not currently taking opioid prescriptions. Functional ability and status Nutritional status Physical activity Advanced directives List of other physicians Hospitalizations, surgeries, and ER visits in previous 12 months Vitals Screenings to include cognitive, depression, and falls Referrals and appointments  In addition, I have reviewed and discussed with patient certain preventive protocols, quality metrics, and best practice recommendations. A written personalized care plan for preventive services as well as general preventive health recommendations were provided to patient.     Melody Comas, [place]   11/25/2020   Nurse Notes:  Mr. Aldava , Thank you for taking time to come for your Medicare Wellness Visit. I appreciate your ongoing commitment to your health goals. Please review the following plan we discussed and let me know if I can assist you in the  future.   These are the goals we discussed:  Goals   None     This is a list of the screening recommended for you and due dates:  Health Maintenance  Topic Date Due   COVID-19 Vaccine (1) Never done   Hepatitis C Screening: USPSTF Recommendation to screen - Ages 45-79 yo.  Never done   Tetanus Vaccine  Never done   Zoster (Shingles) Vaccine (1 of 2) Never done   Flu Shot  Never done   HPV Vaccine  Aged Out      Time spent 25 min

## 2020-11-25 NOTE — ED Provider Notes (Signed)
[facility] Emergency Department Provider Note ____________________________________________   Event Date/Time   First MD Initiated Contact with Patient 11/25/20 1455     (approximate)  I have reviewed the triage vital signs and the nursing notes.  HISTORY  Chief Complaint Dizziness   HPI Benjamin Valentine is a 75 y.o. malewho presents to the ED for evaluation of weakness and dizziness.  Chart review indicates CAD s/p CABG.  Recent medical admission 2 weeks ago, discharging home about 10 days ago, due to a CHF exacerbation.  Known reduced EF.  Aspirin only thinner.  Patient presents to the ED for evaluation of about 1 hour of dizziness, weakness and sensation changes to his left arm.  He reports his left leg has been feeling weak for 2 or 3 days, but the dizziness and left arm sensation changes are more acute.  Denies falls or syncope.  Denies fevers or recent illnesses.  Denies back pain that is new.  Reports a general sensation of feeling woozy headed and not quite right.  Past Medical History:  Diagnosis Date   Arthritis    Coronary artery disease    Gout    Hypertension     Patient Active Problem List   Diagnosis Date Noted   Coronary artery disease involving native coronary artery of native heart without angina pectoris    Acute HFrEF (heart failure with reduced ejection fraction) (HCC)    Stage 3a chronic kidney disease (HCC) 11/11/2020   Hx of CABG 11/11/2020   Acute dyspnea 11/11/2020   Elevated troponin 11/11/2020   Gout 11/11/2020   Abdominal distention 11/11/2020   Hypertensive urgency 11/11/2020   Dyspnea 11/11/2020   Acute on chronic congestive heart failure (HCC)    AKI (acute kidney injury) (HCC)    Chronic gout due to renal impairment involving foot without tophus 04/13/2020   Primary osteoarthritis of both ankles 04/13/2020   Left groin hernia 01/26/2020   Coronary artery disease, non-occlusive 01/26/2020   Hip pain 01/26/2020    Essential hypertension 01/26/2020   Acute appendicitis 08/11/2018    Past Surgical History:  Procedure Laterality Date   CARDIAC SURGERY     CABG 2002   LAPAROSCOPIC APPENDECTOMY N/A 08/11/2018   Procedure: APPENDECTOMY LAPAROSCOPIC;  Surgeon: Henrene Dodge, MD;  Location: [facility] ORS;  Service: General;  Laterality: N/A;   RIGHT/LEFT HEART CATH AND CORONARY/GRAFT ANGIOGRAPHY N/A 11/15/2020   Procedure: RIGHT/LEFT HEART CATH AND CORONARY/GRAFT ANGIOGRAPHY;  Surgeon: Iran Ouch, MD;  Location: [facility] INVASIVE CV LAB;  Service: Cardiovascular;  Laterality: N/A;    Prior to Admission medications   Medication Sig Start Date End Date Taking? Authorizing Provider  atorvastatin (LIPITOR) 40 MG tablet Take 1 tablet (40 mg total) by mouth daily. 11/24/20  Yes [name], Renda Rolls, MD  carvedilol (COREG) 3.125 MG tablet Take 1 tablet (3.125 mg total) by mouth 2 (two) times daily with a meal. 11/24/20  Yes [name], Renda Rolls, MD  dapagliflozin propanediol (FARXIGA) 5 MG TABS tablet Take 1 tablet (5 mg total) by mouth daily. 11/18/20  Yes Marrion Coy, MD  hydrALAZINE (APRESOLINE) 50 MG tablet Take 1 tablet (50 mg total) by mouth 3 (three) times daily. 11/24/20  Yes [name], Renda Rolls, MD  albuterol (VENTOLIN HFA) 108 (90 Base) MCG/ACT inhaler Inhale 2 puffs into the lungs every 6 (six) hours as needed for wheezing or shortness of breath. 06/06/18   Emily Filbert, MD  allopurinol (ZYLOPRIM) 300 MG tablet TAKE 1 TABLET BY MOUTH EVERY DAY Patient  not taking: Reported on 11/25/2020 10/11/20   Corky Downs, MD  aspirin EC 81 MG tablet Take 81 mg by mouth daily. Patient not taking: Reported on 11/25/2020    [provider]    Allergies Patient has no known allergies.  No family history on file.  Social History Social History   Tobacco Use   Smoking status: Former   Smokeless tobacco: Never  Building services engineer Use: Never used  Substance Use Topics   Alcohol use: No   Drug use: No    Review  of Systems  Constitutional: No fever/chills Eyes: No visual changes. ENT: No sore throat. Cardiovascular: Denies chest pain. Respiratory: Denies shortness of breath. Gastrointestinal: No abdominal pain.  No nausea, no vomiting.  No diarrhea.  No constipation. Genitourinary: Negative for dysuria. Musculoskeletal: Negative for back pain. Skin: Negative for rash. Neurological: Negative for headaches.  Positive for left-sided weakness and numbness  ____________________________________________   PHYSICAL EXAM:  VITAL SIGNS: Vitals:   11/25/20 1442  BP: (!) 156/76  Pulse: (!) 105  Resp: 19  Temp: 98 F (36.7 C)  SpO2: 100%     Constitutional: Alert and oriented. Well appearing and in no acute distress. Eyes: Conjunctivae are normal. PERRL. EOMI. Head: Atraumatic. Nose: No congestion/rhinnorhea. Mouth/Throat: Mucous membranes are moist.  Oropharynx non-erythematous. Neck: No stridor. No cervical spine tenderness to palpation. Cardiovascular: Normal rate, regular rhythm. Grossly normal heart sounds.  Good peripheral circulation. Respiratory: Normal respiratory effort.  No retractions. Lungs CTAB. Gastrointestinal: Soft , nondistended, nontender to palpation. No CVA tenderness. Musculoskeletal: No lower extremity tenderness.  No joint effusions. No signs of acute trauma. Trace pitting edema to bilateral lower extremities. Neurologic:  Normal speech and language.  NIH of 2 due to left leg drift and left arm sensation changes. Otherwise neurologically intact.  Cranial nerves intact. Skin:  Skin is warm, dry and intact. No rash noted. Psychiatric: Mood and affect are normal. Speech and behavior are normal. ____________________________________________   LABS (all labs ordered are listed, but only abnormal results are displayed)  Labs Reviewed  APTT - Abnormal; Notable for the following components:      Result Value   aPTT 53 (*)    All other components within normal limits   DIFFERENTIAL - Abnormal; Notable for the following components:   Monocytes Absolute 1.4 (*)    All other components within normal limits  COMPREHENSIVE METABOLIC PANEL - Abnormal; Notable for the following components:   Sodium 132 (*)    Potassium 5.3 (*)    CO2 21 (*)    Glucose, Bld 118 (*)    BUN 41 (*)    Creatinine, Ser 1.86 (*)    Total Protein 8.6 (*)    GFR, Estimated 37 (*)    All other components within normal limits  CBG MONITORING, ED - Abnormal; Notable for the following components:   Glucose-Capillary 119 (*)    All other components within normal limits  RESP PANEL BY RT-PCR (FLU A&B, COVID) ARPGX2  PROTIME-INR  CBC   ____________________________________________  12 Lead EKG  Sinus rhythm the rate of 84 bpm.  Normal axis.  Prolonged PR interval at 237 ms and otherwise normal intervals.  Stigmata of LVH.  Nonspecific ST changes throughout without STEMI. Nearly identical to EKG from 2 weeks ago. ____________________________________________  RADIOLOGY  ED MD interpretation: CT head reviewed by me without evidence of acute ICH.  Official radiology report(s): CT HEAD CODE STROKE WO CONTRAST  Result Date: 11/25/2020 CLINICAL  DATA:  Code stroke. Left upper extremity weakness, wheezy feeling EXAM: CT HEAD WITHOUT CONTRAST TECHNIQUE: Contiguous axial images were obtained from the base of the skull through the vertex without intravenous contrast. COMPARISON:  CT head 09/28/2015 FINDINGS: Brain: There is no acute intracranial hemorrhage, extra-axial fluid collection, or acute infarct. There is mild parenchymal volume loss with commensurate enlargement of the ventricular system. There is mild chronic white matter microangiopathy. There is no mass lesion. There is no midline shift. Vascular: There is calcification of the bilateral cavernous ICAs. There is no dense vessel. Skull: Normal. Negative for fracture or focal lesion. Sinuses/Orbits: The imaged paranasal sinuses are  clear. The imaged globes and orbits are unremarkable. Other: None. ASPECTS [facility] Stroke Program Early CT Score) - Ganglionic level infarction (caudate, lentiform nuclei, internal capsule, insula, M1-M3 cortex): 7 - Supraganglionic infarction (M4-M6 cortex): 3 Total score (0-10 with 10 being normal): 10 IMPRESSION: 1. No acute intracranial hemorrhage or infarct. 2. ASPECTS is 10. These results were called by telephone at the time of interpretation on 11/25/2020 at 3:02 pm to provider Dr Sidney Ace, who verbally acknowledged these results. Electronically Signed   By: Lesia Hausen M.D.   On: 11/25/2020 15:03   CT ANGIO HEAD NECK W WO CM (CODE STROKE)  Result Date: 11/25/2020 CLINICAL DATA:  Left leg weakness, left arm numbness, code stroke follow-up EXAM: CT ANGIOGRAPHY HEAD AND NECK TECHNIQUE: Multidetector CT imaging of the head and neck was performed using the standard protocol during bolus administration of intravenous contrast. Multiplanar CT image reconstructions and MIPs were obtained to evaluate the vascular anatomy. Carotid stenosis measurements (when applicable) are obtained utilizing NASCET criteria, using the distal internal carotid diameter as the denominator. CONTRAST:  55mL OMNIPAQUE IOHEXOL 350 MG/ML SOLN COMPARISON:  None. FINDINGS: CTA NECK Aortic arch: Mixed plaque along the arch and patent great vessel origins. Right carotid system: Patent. Atherosclerotic wall thickening along the common carotid. Mixed plaque along the proximal internal carotid with less than 50% stenosis. Left carotid system: Patent. Eccentric noncalcified plaque along the common carotid causing nearly 50% stenosis. Mixed plaque along the proximal internal carotid causing less than 50% stenosis. Vertebral arteries: Left vertebral artery is patent and dominant. Calcified plaque at the left vertebral origin with mild stenosis. There is high-grade stenosis of the distal left V1 segment. Diminutive right vertebral artery is poorly  visualized in the distal V2 and V3 segments. There is plaque at the origin with possible high-grade stenosis. Skeleton: Degenerative changes of the cervical spine with multilevel canal and foraminal stenosis. Other neck: Unremarkable. Upper chest: Mild emphysema.  No apical lung mass. Review of the MIP images confirms the above findings CTA HEAD Anterior circulation: Intracranial internal carotid arteries are patent plaque causes mild stenosis of the distal right petrous portion. Additional calcified plaque along cavernous and supraclinoid portions causing mild to moderate stenosis. Anterior and middle cerebral arteries are patent. There is likely atherosclerotic irregularity of medium and small vessel branches. Posterior circulation: Intracranial left vertebral artery is patent with mild atherosclerotic irregularity and stenoses. Intracranial right vertebral artery is not definitely seen. Basilar artery is patent and terminates as left PCA. Fetal right PCA. The posterior cerebral arteries are patent. There is focal marked stenosis of the right P2 PCA. Focal moderate stenosis of the left P2 PCA. Venous sinuses: Patent as allowed by contrast bolus timing. Review of the MIP images confirms the above findings IMPRESSION: No large vessel occlusion. Plaque along the proximal right ICA causing less than 50% stenosis. Plaque  along the left common carotid causing nearly 50% stenosis. Plaque along the proximal left ICA causing less than 50% stenosis. Diminutive right vertebral artery with possible high-grade stenosis at the origin. Intracranial portion is not visualized. This is age-indeterminate. Multifocal intracranial atherosclerosis. Electronically Signed   By: Guadlupe Spanish M.D.   On: 11/25/2020 16:18    ____________________________________________   PROCEDURES and INTERVENTIONS  Procedure(s) performed (including Critical Care):  .1-3 Lead EKG Interpretation Performed by: Delton Prairie, MD Authorized by:  Delton Prairie, MD     Interpretation: normal     ECG rate:  94   ECG rate assessment: normal     Rhythm: sinus rhythm     Ectopy: none     Conduction: normal   .Critical Care Performed by: Delton Prairie, MD Authorized by: Delton Prairie, MD   Critical care provider statement:    Critical care time (minutes):  30   Critical care was necessary to treat or prevent imminent or life-threatening deterioration of the following conditions:  CNS failure or compromise   Critical care was time spent personally by me on the following activities:  Ordering and performing treatments and interventions, ordering and review of radiographic studies, ordering and review of laboratory studies, examination of patient, evaluation of patient's response to treatment, discussions with consultants, development of treatment plan with patient or surrogate and review of old charts  Medications  sodium chloride flush (NS) 0.9 % injection 3 mL (has no administration in time range)  iohexol (OMNIPAQUE) 350 MG/ML injection 75 mL (75 mLs Intravenous Contrast Given 11/25/20 1542)    ____________________________________________   MDM / ED COURSE   75 year old male presents to the ED with acute left-sided neurologic changes concerning for stroke, requiring medical admission.  He has an NIH of 2 due to left leg weakness and left arm sensorium changes.  No ICH on CT head and CTA without large vessel occlusion.  Not a thrombolytic candidate due to weakness been present for multiple days.  Blood work with marginal worsening of his renal dysfunction.  MRI ordered and pending.  We will discuss with medicine for admission.  Clinical Course as of 11/25/20 1640  Thu Nov 25, 2020  1501 Discussed with Dr. Amada Jupiter, neurology, plan for CTA. No lytics due to uncertain timeframe and high risk profile. Will MRI and admit [DS]    Clinical Course User Index [DS] Delton Prairie, MD     ____________________________________________   FINAL CLINICAL IMPRESSION(S) / ED DIAGNOSES  Final diagnoses:  Left-sided weakness  Dizziness     ED Discharge Orders     None        Voula Waln Katrinka Blazing   Note:  This document was prepared using Dragon voice recognition software and may include unintentional dictation errors.    Delton Prairie, MD 11/25/20 [phone]

## 2020-11-25 NOTE — ED Triage Notes (Addendum)
Pt comes with c/o weakness, dizziness and left arm pain. Pt states this started about 45 minutes ago.   Pt states he was eating and finished his lunch then went to stand up and got woozy headed.   Pt denies any blurry vision. Pt states he feels off balance and something just isn't right.  Slight left arm drift noted. LKW about 1:55

## 2020-11-25 NOTE — Consult Note (Addendum)
CODE STROKE- PHARMACY COMMUNICATION   Time CODE STROKE called/page received:1445  Time response to CODE STROKE was made (in person or via phone): 1445  No TNK given  Provider spoken to: Dr. Amada Jupiter   Past Medical History:  Diagnosis Date   Arthritis    Coronary artery disease    Gout    Hypertension     Doloris Hall, PharmD Pharmacy Resident  11/25/2020 3:04 PM

## 2020-11-25 NOTE — ED Notes (Signed)
MRI screening patient at this time. 

## 2020-11-26 ENCOUNTER — Ambulatory Visit: Payer: Medicare HMO | Admitting: Physician Assistant

## 2020-11-26 DIAGNOSIS — M109 Gout, unspecified: Secondary | ICD-10-CM | POA: Diagnosis not present

## 2020-11-26 DIAGNOSIS — R531 Weakness: Secondary | ICD-10-CM | POA: Diagnosis not present

## 2020-11-26 DIAGNOSIS — G459 Transient cerebral ischemic attack, unspecified: Secondary | ICD-10-CM | POA: Diagnosis not present

## 2020-11-26 LAB — BASIC METABOLIC PANEL
Anion gap: 9 (ref 5–15)
BUN: 34 mg/dL — ABNORMAL HIGH (ref 8–23)
CO2: 22 mmol/L (ref 22–32)
Calcium: 8.9 mg/dL (ref 8.9–10.3)
Chloride: 105 mmol/L (ref 98–111)
Creatinine, Ser: 1.57 mg/dL — ABNORMAL HIGH (ref 0.61–1.24)
GFR, Estimated: 45 mL/min — ABNORMAL LOW (ref >60)
Glucose, Bld: 89 mg/dL (ref 70–99)
Potassium: 4.3 mmol/L (ref 3.5–5.1)
Sodium: 136 mmol/L (ref 135–145)

## 2020-11-26 LAB — GLUCOSE, CAPILLARY
Glucose-Capillary: 96 mg/dL (ref 70–99)
Glucose-Capillary: 107 mg/dL — ABNORMAL HIGH (ref 70–99)

## 2020-11-26 LAB — HEMOGLOBIN A1C
Hgb A1c MFr Bld: 6 % — ABNORMAL HIGH (ref 4.8–5.6)
Mean Plasma Glucose: 126 mg/dL

## 2020-11-26 LAB — URIC ACID: Uric Acid, Serum: 8.4 mg/dL (ref 3.7–8.6)

## 2020-11-26 MED ORDER — ALLOPURINOL 100 MG PO TABS: 50.0000 mg | ORAL_TABLET | Freq: Every day | ORAL | 3 refills | Status: DC

## 2020-11-26 MED ORDER — ALLOPURINOL 100 MG PO TABS
50.0000 mg | ORAL_TABLET | Freq: Every day | ORAL | Status: DC
  Administered 2020-11-26: 09:00:00 50 mg via ORAL
  Filled 2020-11-26: qty 1

## 2020-11-26 MED ORDER — ASPIRIN 81 MG PO TBEC: 81.0000 mg | DELAYED_RELEASE_TABLET | Freq: Every day | ORAL | 11 refills | Status: DC

## 2020-11-26 MED ORDER — CLOPIDOGREL BISULFATE 75 MG PO TABS: 75.0000 mg | ORAL_TABLET | Freq: Every day | ORAL | 0 refills | Status: AC

## 2020-11-26 NOTE — Progress Notes (Signed)
SLP Cancellation Note  Patient Details Name: Benjamin Valentine MRN: [id number] DOB: 1946-01-22   Cancelled treatment:       Reason Eval/Treat Not Completed: SLP screened, no needs identified, will sign off (chart reviewed; consulted NSG and pt). Pt denied any difficulty swallowing and is currently on a regular diet; tolerates swallowing pills w/ water per NSG. Pt conversed in conversation w/out overt expressive/receptive deficits noted; pt and family denied any speech-language deficits. Speech intelligible. No further skilled ST services indicated as pt appears at his baseline. Pt agreed. NSG to reconsult if any change in status while admitted.       Jerilynn Som, MS, CCC-SLP Speech Language Pathologist Rehab Services [phone] [facility] 11/26/2020, 10:01 AM
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
## 2020-11-26 NOTE — Evaluation (Signed)
Physical Therapy Evaluation Patient Details Name: Benjamin Valentine MRN: [id number] DOB: [birth year] Today's Date: 11/26/2020  History of Present Illness  Benjamin Valentine is a 75 y.o. male with a history of coronary artery disease, hypertension who presents with left-sided numbness.  Of note, he has gout in his left leg, and has been feeling like it was getting weaker over the past few days but then he noticed weakness and numbness of his arm as well this afternoon.  Clinical Impression  The pt presents this session with no weakness noted. He presents with some balance deficits, but has good self awareness to maintain safety. At this time the pt is not presenting with any skilled PT needs. PT will delist.        Recommendations for follow up therapy are one component of a multi-disciplinary discharge planning process, led by the attending physician.  Recommendations may be updated based on patient status, additional functional criteria and insurance authorization.  Follow Up Recommendations No PT follow up    Equipment Recommendations  None recommended by PT    Recommendations for Other Services       Precautions / Restrictions Precautions Precautions: None Restrictions Weight Bearing Restrictions: No      Mobility  Bed Mobility Overal bed mobility: Independent                  Transfers Overall transfer level: Independent                  Ambulation/Gait Ambulation/Gait assistance: Supervision Gait Distance (Feet): 400 Feet Assistive device: None Gait Pattern/deviations: Antalgic     General Gait Details: Intermittent pain with L stance phase d/t gout flare.  Stairs            Wheelchair Mobility    Modified Rankin (Stroke Patients Only)       Balance Overall balance assessment: Modified Independent                                           Pertinent Vitals/Pain Pain Assessment: No/denies pain    Home Living  Family/patient expects to be discharged to:: Private residence Living Arrangements: Children Available Help at Discharge: Family;Available PRN/intermittently Type of Home: House Home Access: Stairs to enter Entrance Stairs-Rails: Right Entrance Stairs-Number of Steps: 3 Home Layout: One level Home Equipment: Cane - single point;Walker - 2 wheels      Prior Function Level of Independence: Independent               Hand Dominance   Dominant Hand: Right    Extremity/Trunk Assessment   Upper Extremity Assessment Upper Extremity Assessment: Overall WFL for tasks assessed    Lower Extremity Assessment Lower Extremity Assessment: Overall WFL for tasks assessed;LLE deficits/detail LLE Deficits / Details: did not assess R knee flexion/extension d/t gout       Communication   Communication: No difficulties  Cognition Arousal/Alertness: Awake/alert Behavior During Therapy: WFL for tasks assessed/performed Overall Cognitive Status: Within Functional Limits for tasks assessed                                        General Comments      Exercises Other Exercises Other Exercises: 5xSTS: 14.37s; use of UE for sit<>stand transfer. Other Exercises: SLS on R: 4s;  semitandem: 10s; tandem 10s   Assessment/Plan    PT Assessment Patent does not need any further PT services  PT Problem List         PT Treatment Interventions      PT Goals (Current goals can be found in the Care Plan section)  Acute Rehab PT Goals Patient Stated Goal: to return home with family PT Goal Formulation: With patient Time For Goal Achievement: 12/10/20 Potential to Achieve Goals: Good    Frequency     Barriers to discharge        Co-evaluation               AM-PAC PT "6 Clicks" Mobility  Outcome Measure Help needed turning from your back to your side while in a flat bed without using bedrails?: None Help needed moving from lying on your back to sitting on the side  of a flat bed without using bedrails?: None Help needed moving to and from a bed to a chair (including a wheelchair)?: None Help needed standing up from a chair using your arms (e.g., wheelchair or bedside chair)?: None Help needed to walk in hospital room?: None Help needed climbing 3-5 steps with a railing? : A Little 6 Click Score: 23    End of Session   Activity Tolerance: Patient tolerated treatment well;No increased pain Patient left: in bed Nurse Communication: Mobility status PT Visit Diagnosis: Unsteadiness on feet (R26.81)    Time: 6378-5885 PT Time Calculation (min) (ACUTE ONLY): 27 min   Charges:   PT Evaluation $PT Eval Moderate Complexity: 1 Mod PT Treatments $Gait Training: 8-22 mins        10:15 AM, 11/26/20 Benjamin Valentine A. Mordecai Maes PT, DPT Physical Therapist - [facility] [place]   [name] 11/26/2020, 10:15 AM

## 2020-11-26 NOTE — Progress Notes (Signed)
I have reviewed this visit and agree with the documentation.   

## 2020-11-26 NOTE — Progress Notes (Signed)
MRI is negative, there is possibly some vertebral stenosis.  He does have significant pain consistent with gout in his left elbow and left knee.  Today on my exam, he has symmetric sensation, no drift in the leg or the arm.    I asked him about the numbness yesterday, and he states that yesterday it did feel different, but today feels the same.  He also endorses that his mother had a significant stroke and when he was feeling asymmetry yesterday he got quite anxious.   CTA head: No large vessel occlusion.  Plaque along the proximal right ICA causing less than 50% stenosis. Plaque along the left common carotid causing nearly 50% stenosis. Plaque along the proximal left ICA causing less than 50% stenosis.  Diminutive right vertebral artery with possible high-grade stenosis at the origin. Intracranial portion is not visualized. This is age-indeterminate.  Multifocal intracranial atherosclerosis.   MRI brain: Negative  LDL was 70 on 9/30 Recent echo with CHF, EF of 25 to 30%   Impression: I think that it is a possibility that this represents gout flare plus anxiety, but given his multiple risk factors, atheromatous disease on CTA, and fact that dizziness would not be explained by gout I would favor treating this as transient ischemic attack.  His EF is not quite low enough to justify anticoagulation at this time.   1) aspirin 81 mg daily and Plavix 75 mg daily x3 weeks followed by aspirin monotherapy 2) continue current lipid management given LDL is at goal 3) no further recommendations at this time, please call with further questions or concerns.  Ritta Slot, MD [facility] [phone]  If 7pm- 7am, please page neurology on call as listed in [facility].

## 2020-11-26 NOTE — Discharge Summary (Signed)
Physician Discharge Summary   Patient name: Benjamin Valentine  Admit date:     11/25/2020  Discharge date: 11/26/2020  Discharge Physician: Lewie Chamber   PCP: Corky Downs, MD   Recommendations at discharge: Continue routine medical care  Discharge Diagnoses Active Problems:   Gout   TIA (transient ischemic attack)   Resolved Diagnoses Resolved Problems:   * No resolved hospital problems. [facility]    Benjamin Valentine is a 75 year old male with PMH systolic CHF, CAD status post CABG, CKD 3a, hypertension, hyperlipidemia, gout who presented with left arm and left leg weakness and some numbness. He also stated that he was recently taken off of his gout medication (allopurinol) for unknown reasons and since then has been developing worsening pain in his left elbow and left knee which he stated were typical and previous sites of gout pain. Due to the weakness and numbness symptoms on admission, he underwent stroke work-up and neurology consultation. MRI brain was negative for stroke and no large occlusion noted on CT angio head/neck. Per neurology, given his risk factors and presenting symptoms, he was continued on aspirin and Plavix for 3 weeks followed by aspirin monotherapy at discharge.  Given his underlying CKD stage III, he was likely taken off of allopurinol recently due to his renal function.  Per his GFR, he was resumed on renally dosed allopurinol, 50 mg daily at time of discharge.  Uric acid level was also checked which was 8.4 mg/dL.    Procedures performed:    Condition at discharge: stable  Exam Physical Exam Constitutional:      General: He is not in acute distress.    Appearance: Normal appearance. He is not ill-appearing.  HENT:     Head: Normocephalic and atraumatic.     Mouth/Throat:     Mouth: Mucous membranes are moist.  Eyes:     Extraocular Movements: Extraocular movements intact.     Pupils: Pupils are equal, round, and reactive to light.   Cardiovascular:     Rate and Rhythm: Normal rate and regular rhythm.     Heart sounds: Normal heart sounds.  Pulmonary:     Effort: Pulmonary effort is normal.     Breath sounds: Normal breath sounds. No wheezing.  Abdominal:     General: Bowel sounds are normal. There is no distension.     Palpations: Abdomen is soft.     Tenderness: There is no abdominal tenderness.  Musculoskeletal:        General: Normal range of motion.     Cervical back: Normal range of motion and neck supple.     Comments: Mild swelling noted in left suprapatellar area.  Pain with passive range of motion in the left knee and left elbow  Skin:    General: Skin is warm and dry.  Neurological:     Mental Status: He is alert.     Comments: 4/5 strength in left lower extremity.  5/5 strength appreciated in remaining extremities.  No subjective paresthesia per patient on sensory exam  Psychiatric:        Mood and Affect: Mood normal.        Behavior: Behavior normal.     Disposition: Home  Discharge time: greater than 30 minutes.   Allergies as of 11/26/2020   No Known Allergies      Medication List     TAKE these medications    albuterol 108 (90 Base) MCG/ACT inhaler Commonly known as: VENTOLIN HFA  Inhale 2 puffs into the lungs every 6 (six) hours as needed for wheezing or shortness of breath.   allopurinol 100 MG tablet Commonly known as: ZYLOPRIM Take 0.5 tablets (50 mg total) by mouth daily. Start taking on: November 27, 2020 What changed:  medication strength how much to take   aspirin 81 MG EC tablet Take 1 tablet (81 mg total) by mouth daily. Swallow whole. Start taking on: November 27, 2020 What changed: additional instructions   atorvastatin 40 MG tablet Commonly known as: LIPITOR Take 1 tablet (40 mg total) by mouth daily.   carvedilol 3.125 MG tablet Commonly known as: COREG Take 1 tablet (3.125 mg total) by mouth 2 (two) times daily with a meal.   clopidogrel 75 MG  tablet Commonly known as: PLAVIX Take 1 tablet (75 mg total) by mouth daily for 21 days. Start taking on: November 27, 2020   dapagliflozin propanediol 5 MG Tabs tablet Commonly known as: FARXIGA Take 1 tablet (5 mg total) by mouth daily.   hydrALAZINE 50 MG tablet Commonly known as: APRESOLINE Take 1 tablet (50 mg total) by mouth 3 (three) times daily.        DG Chest 2 View  Result Date: 11/11/2020 CLINICAL DATA:  Cough and abdominal pain EXAM: CHEST - 2 VIEW COMPARISON:  11/03/2018 FINDINGS: Moderate cardiomegaly. No pulmonary edema. Normal pleural spaces. No focal airspace consolidation. Remote median sternotomy. IMPRESSION: Cardiomegaly without pulmonary edema. Electronically Signed   By: Deatra Robinson M.D.   On: 11/11/2020 02:43   MR BRAIN WO CONTRAST  Result Date: 11/25/2020 CLINICAL DATA:  Acute left-sided weakness EXAM: MRI HEAD WITHOUT CONTRAST TECHNIQUE: Multiplanar, multiecho pulse sequences of the brain and surrounding structures were obtained without intravenous contrast. COMPARISON:  No prior MRI, correlation is made with CT head 11/25/2020. FINDINGS: Brain: No acute infarction, hemorrhage, hydrocephalus, extra-axial collection, or mass lesion. The ventricles and sulci are within normal limits for age. T2 hyperintense signal in the periventricular white matter, likely the sequela of chronic small vessel ischemic disease. Scattered foci of susceptibility in the bilateral cerebral hemispheres, cerebellar hemispheres, right pons, and left basal ganglia, possibly sequela of prior hypertensive microhemorrhages. Vascular: Normal flow voids. Skull and upper cervical spine: Normal marrow signal. Sinuses/Orbits: Mucosal thickening in the left maxillary sinus. The orbits are unremarkable. Other: The mastoids are well aerated. IMPRESSION: No acute intracranial process. Electronically Signed   By: Wiliam Ke M.D.   On: 11/25/2020 19:09   CARDIAC CATHETERIZATION  Result Date:  11/15/2020   Ost LM lesion is 20% stenosed.   Prox Cx to Mid Cx lesion is 99% stenosed.   Mid Cx to Dist Cx lesion is 99% stenosed.   1st Mrg lesion is 30% stenosed.   Mid LAD lesion is 100% stenosed.   Prox RCA lesion is 100% stenosed.   Origin lesion is 100% stenosed.   SVG.   LIMA graft was visualized by angiography and is normal in caliber.   The graft exhibits no disease. 1.  Significant underlying three-vessel coronary artery disease with occluded mid LAD, occluded proximal right coronary artery and subtotal occlusion of the mid left circumflex after the origin of a large high OM1 which supplies the majority of the left circumflex distribution. Patent LIMA to LAD with occluded SVG to RCA and SVG to OM. The RCA gets left-to-right collaterals. 2.  Left ventricular angiography was not performed due to chronic kidney disease. 3.  Right heart catheterization showed high normal filling pressures, moderate pulmonary hypertension  and moderately to severely reduced cardiac output. Recommendations: No revascularization is recommended for coronary artery disease.  Recommend continuing medical therapy. Oral furosemide can be resumed tomorrow.   DG Abd 2 Views  Result Date: 11/11/2020 CLINICAL DATA:  Abdominal pain EXAM: ABDOMEN - 2 VIEW COMPARISON:  CTA abdomen/pelvis 09/10/2018 FINDINGS: There is mild gaseous distention of the stomach and large bowel without evidence of mechanical obstruction. There is a mild stool burden projecting over the rectum. No free intraperitoneal air is identified. Median sternotomy wires are noted. The lung bases are clear. There is no acute osseous abnormality. IMPRESSION: Mild gaseous distention of the stomach and large bowel without evidence of mechanical obstruction. Electronically Signed   By: Lesia Hausen M.D.   On: 11/11/2020 09:51   ECHOCARDIOGRAM COMPLETE  Result Date: 11/11/2020    ECHOCARDIOGRAM REPORT   Patient Name:   PETRA WHITTIKER Paster Date of Exam: 11/11/2020 Medical Rec #:   [id number]      Height:       69.0 in Accession #:    [id number]     Weight:       220.0 lb Date of Birth:  29-Mar-1945       BSA:          2.151 m Patient Age:    75 years       BP:           154/94 mmHg Patient Gender: M              HR:           74 bpm. Exam Location:  [facility] Procedure: 2D Echo, Cardiac Doppler and Color Doppler Indications:     CHF-acute diastolic I50.31  History:         Patient has no prior history of Echocardiogram examinations.                  CAD; Risk Factors:Hypertension.  Sonographer:     Cristela Blue Referring Phys:  [id number] Andris Baumann Diagnosing Phys: Arnoldo Hooker MD  Sonographer Comments: Technically challenging study due to limited acoustic windows. The only view obtainable was parasternal. IMPRESSIONS  1. Left ventricular ejection fraction, by estimation, is 25 to 30%. The left ventricle has severely decreased function. The left ventricle demonstrates global hypokinesis. The left ventricular internal cavity size was mildly to moderately dilated. Left ventricular diastolic parameters were normal.  2. Right ventricular systolic function is normal. The right ventricular size is normal.  3. The mitral valve is normal in structure. Mild mitral valve regurgitation.  4. The aortic valve is normal in structure. Aortic valve regurgitation is not visualized. FINDINGS  Left Ventricle: Left ventricular ejection fraction, by estimation, is 25 to 30%. The left ventricle has severely decreased function. The left ventricle demonstrates global hypokinesis. The left ventricular internal cavity size was mildly to moderately dilated. There is no left ventricular hypertrophy. Left ventricular diastolic parameters were normal. Right Ventricle: The right ventricular size is normal. No increase in right ventricular wall thickness. Right ventricular systolic function is normal. Left Atrium: Left atrial size was normal in size. Right Atrium: Right atrial size was normal in size. Pericardium: There is no  evidence of pericardial effusion. Mitral Valve: The mitral valve is normal in structure. Mild mitral valve regurgitation. Tricuspid Valve: The tricuspid valve is normal in structure. Tricuspid valve regurgitation is mild. Aortic Valve: The aortic valve is normal in structure. Aortic valve regurgitation is not visualized. Pulmonic Valve: The pulmonic valve was normal  in structure. Pulmonic valve regurgitation is trivial. Aorta: The aortic root and ascending aorta are structurally normal, with no evidence of dilitation. IAS/Shunts: No atrial level shunt detected by color flow Doppler.  LEFT VENTRICLE PLAX 2D LVIDd:         5.50 cm LVIDs:         5.00 cm LV PW:         1.60 cm LV IVS:        1.80 cm LVOT diam:     2.00 cm LVOT Area:     3.14 cm  LEFT ATRIUM         Index LA diam:    4.60 cm 2.14 cm/m                        PULMONIC VALVE AORTA                 PV Vmax:          0.43 m/s Ao Root diam: 3.40 cm PV Peak grad:     0.7 mmHg                       PR End Diast Vel: 18.32 msec                       RVOT Peak grad:   1 mmHg   SHUNTS Systemic Diam: 2.00 cm Arnoldo Hooker MD Electronically signed by Arnoldo Hooker MD Signature Date/Time: 11/11/2020/12:13:54 PM    Final    CT HEAD CODE STROKE WO CONTRAST  Result Date: 11/25/2020 CLINICAL DATA:  Code stroke. Left upper extremity weakness, wheezy feeling EXAM: CT HEAD WITHOUT CONTRAST TECHNIQUE: Contiguous axial images were obtained from the base of the skull through the vertex without intravenous contrast. COMPARISON:  CT head 09/28/2015 FINDINGS: Brain: There is no acute intracranial hemorrhage, extra-axial fluid collection, or acute infarct. There is mild parenchymal volume loss with commensurate enlargement of the ventricular system. There is mild chronic white matter microangiopathy. There is no mass lesion. There is no midline shift. Vascular: There is calcification of the bilateral cavernous ICAs. There is no dense vessel. Skull: Normal. Negative for  fracture or focal lesion. Sinuses/Orbits: The imaged paranasal sinuses are clear. The imaged globes and orbits are unremarkable. Other: None. ASPECTS [facility] Stroke Program Early CT Score) - Ganglionic level infarction (caudate, lentiform nuclei, internal capsule, insula, M1-M3 cortex): 7 - Supraganglionic infarction (M4-M6 cortex): 3 Total score (0-10 with 10 being normal): 10 IMPRESSION: 1. No acute intracranial hemorrhage or infarct. 2. ASPECTS is 10. These results were called by telephone at the time of interpretation on 11/25/2020 at 3:02 pm to provider Dr Sidney Ace, who verbally acknowledged these results. Electronically Signed   By: Lesia Hausen M.D.   On: 11/25/2020 15:03   CT ANGIO HEAD NECK W WO CM (CODE STROKE)  Result Date: 11/25/2020 CLINICAL DATA:  Left leg weakness, left arm numbness, code stroke follow-up EXAM: CT ANGIOGRAPHY HEAD AND NECK TECHNIQUE: Multidetector CT imaging of the head and neck was performed using the standard protocol during bolus administration of intravenous contrast. Multiplanar CT image reconstructions and MIPs were obtained to evaluate the vascular anatomy. Carotid stenosis measurements (when applicable) are obtained utilizing NASCET criteria, using the distal internal carotid diameter as the denominator. CONTRAST:  51mL OMNIPAQUE IOHEXOL 350 MG/ML SOLN COMPARISON:  None. FINDINGS: CTA NECK Aortic arch: Mixed plaque along the arch and patent great vessel  origins. Right carotid system: Patent. Atherosclerotic wall thickening along the common carotid. Mixed plaque along the proximal internal carotid with less than 50% stenosis. Left carotid system: Patent. Eccentric noncalcified plaque along the common carotid causing nearly 50% stenosis. Mixed plaque along the proximal internal carotid causing less than 50% stenosis. Vertebral arteries: Left vertebral artery is patent and dominant. Calcified plaque at the left vertebral origin with mild stenosis. There is high-grade stenosis  of the distal left V1 segment. Diminutive right vertebral artery is poorly visualized in the distal V2 and V3 segments. There is plaque at the origin with possible high-grade stenosis. Skeleton: Degenerative changes of the cervical spine with multilevel canal and foraminal stenosis. Other neck: Unremarkable. Upper chest: Mild emphysema.  No apical lung mass. Review of the MIP images confirms the above findings CTA HEAD Anterior circulation: Intracranial internal carotid arteries are patent plaque causes mild stenosis of the distal right petrous portion. Additional calcified plaque along cavernous and supraclinoid portions causing mild to moderate stenosis. Anterior and middle cerebral arteries are patent. There is likely atherosclerotic irregularity of medium and small vessel branches. Posterior circulation: Intracranial left vertebral artery is patent with mild atherosclerotic irregularity and stenoses. Intracranial right vertebral artery is not definitely seen. Basilar artery is patent and terminates as left PCA. Fetal right PCA. The posterior cerebral arteries are patent. There is focal marked stenosis of the right P2 PCA. Focal moderate stenosis of the left P2 PCA. Venous sinuses: Patent as allowed by contrast bolus timing. Review of the MIP images confirms the above findings IMPRESSION: No large vessel occlusion. Plaque along the proximal right ICA causing less than 50% stenosis. Plaque along the left common carotid causing nearly 50% stenosis. Plaque along the proximal left ICA causing less than 50% stenosis. Diminutive right vertebral artery with possible high-grade stenosis at the origin. Intracranial portion is not visualized. This is age-indeterminate. Multifocal intracranial atherosclerosis. Electronically Signed   By: Guadlupe Spanish M.D.   On: 11/25/2020 16:18   Results for orders placed or performed during the hospital encounter of 11/25/20  Resp Panel by RT-PCR (Flu A&B, Covid) Nasopharyngeal Swab      Status: None   Collection Time: 11/25/20  4:52 PM   Specimen: Nasopharyngeal Swab; Nasopharyngeal(NP) swabs in vial transport medium  Result Value Ref Range Status   SARS Coronavirus 2 by RT PCR NEGATIVE NEGATIVE Final    Comment: (NOTE) SARS-CoV-2 target nucleic acids are NOT DETECTED.  The SARS-CoV-2 RNA is generally detectable in upper respiratory specimens during the acute phase of infection. The lowest concentration of SARS-CoV-2 viral copies this assay can detect is 138 copies/mL. A negative result does not preclude SARS-Cov-2 infection and should not be used as the sole basis for treatment or other patient management decisions. A negative result may occur with  improper specimen collection/handling, submission of specimen other than nasopharyngeal swab, presence of viral mutation(s) within the areas targeted by this assay, and inadequate number of viral copies(<138 copies/mL). A negative result must be combined with clinical observations, patient history, and epidemiological information. The expected result is Negative.  Fact Sheet for Patients:  BloggerCourse.com  Fact Sheet for Healthcare Providers:  SeriousBroker.it  This test is no t yet approved or cleared by the [place] FDA and  has been authorized for detection and/or diagnosis of SARS-CoV-2 by FDA under an Emergency Use Authorization (EUA). This EUA will remain  in effect (meaning this test can be used) for the duration of the COVID-19 declaration under Section 564(b)(1) of  the Act, 21 U.S.C.section 360bbb-3(b)(1), unless the authorization is terminated  or revoked sooner.       Influenza A by PCR NEGATIVE NEGATIVE Final   Influenza B by PCR NEGATIVE NEGATIVE Final    Comment: (NOTE) The Xpert Xpress SARS-CoV-2/FLU/RSV plus assay is intended as an aid in the diagnosis of influenza from Nasopharyngeal swab specimens and should not be used as a sole basis  for treatment. Nasal washings and aspirates are unacceptable for Xpert Xpress SARS-CoV-2/FLU/RSV testing.  Fact Sheet for Patients: BloggerCourse.com  Fact Sheet for Healthcare Providers: SeriousBroker.it  This test is not yet approved or cleared by the [place] FDA and has been authorized for detection and/or diagnosis of SARS-CoV-2 by FDA under an Emergency Use Authorization (EUA). This EUA will remain in effect (meaning this test can be used) for the duration of the COVID-19 declaration under Section 564(b)(1) of the Act, 21 U.S.C. section 360bbb-3(b)(1), unless the authorization is terminated or revoked.  Performed at [facility], [address] [place] [place], [place] [address]     Signed:  Lewie Chamber MD.  [facility] 11/26/2020, 12:02 PM

## 2020-11-30 NOTE — Progress Notes (Signed)
Patient ID: Benjamin Valentine, male    DOB: [birth year], 75 y.o.   MRN: [id number]  HPI  Mr Foree is a 75 y/o male with a history of CAD (CABG), HTN, arthritis, gout, previous tobacco use and chronic heart failure.   Echo report from 11/11/20 reviewed and showed an EF of 25-30% along with mild MR.   RHC/LHC done 11/15/20 and showed: Ost LM lesion is 20% stenosed.   Prox Cx to Mid Cx lesion is 99% stenosed.   Mid Cx to Dist Cx lesion is 99% stenosed.   1st Mrg lesion is 30% stenosed.   Mid LAD lesion is 100% stenosed.   Prox RCA lesion is 100% stenosed.   Origin lesion is 100% stenosed.   SVG.   LIMA graft was visualized by angiography and is normal in caliber.   The graft exhibits no disease. 1.  Significant underlying three-vessel coronary artery disease with occluded mid LAD, occluded proximal right coronary artery and subtotal occlusion of the mid left circumflex after the origin of a large high OM1 which supplies the majority of the left circumflex distribution. Patent LIMA to LAD with occluded SVG to RCA and SVG to OM. The RCA gets left-to-right collaterals. 2.  Left ventricular angiography was not performed due to chronic kidney disease. 3.  Right heart catheterization showed high normal filling pressures, moderate pulmonary hypertension and moderately to severely reduced cardiac output.  Admitted 11/25/20 due to left arm/leg weakness. Neurology consult obtained. Brain MRI negative. CT angio of head/neck was without occlusion. Discharged the following day. Admitted 11/11/20 due to dry cough, shortness of breath and abdominal bloating. Given duonebs and IV lasix with subsequent stoppage of diuretic due to renal disease. Left inguinal hernia noted. Cardiology and surgical consults obtained. Cath completed. Discharged after 6 days.   He presents today for his initial visit with a chief complaint of minimal shortness of breath upon moderate exertion. He describes this as chronic in nature  having been present for several years. He has associated cough & left knee pain along with this. He denies any dizziness, difficulty sleeping, abdominal distention, palpitations, pedal edema, chest pain, fatigue or weight gain.   Says that his biggest complaint is of worsening left knee gout pain since his allopurinol was decreased.   Past Medical History:  Diagnosis Date   Arthritis    CHF (congestive heart failure) (HCC)    Coronary artery disease    Gout    Hypertension    Past Surgical History:  Procedure Laterality Date   CARDIAC SURGERY     CABG 2002   LAPAROSCOPIC APPENDECTOMY N/A 08/11/2018   Procedure: APPENDECTOMY LAPAROSCOPIC;  Surgeon: Henrene Dodge, MD;  Location: [facility] ORS;  Service: General;  Laterality: N/A;   RIGHT/LEFT HEART CATH AND CORONARY/GRAFT ANGIOGRAPHY N/A 11/15/2020   Procedure: RIGHT/LEFT HEART CATH AND CORONARY/GRAFT ANGIOGRAPHY;  Surgeon: Iran Ouch, MD;  Location: [facility] INVASIVE CV LAB;  Service: Cardiovascular;  Laterality: N/A;   History reviewed. No pertinent family history. Social History   Tobacco Use   Smoking status: Former   Smokeless tobacco: Never  Substance Use Topics   Alcohol use: No   No Known Allergies Prior to Admission medications   Medication Sig Start Date End Date Taking? Authorizing Provider  allopurinol (ZYLOPRIM) 100 MG tablet Take 0.5 tablets (50 mg total) by mouth daily. Patient taking differently: Take 100 mg by mouth daily. 11/27/20  Yes Lewie Chamber, MD  aspirin EC 81 MG EC tablet Take 1 tablet (  81 mg total) by mouth daily. Swallow whole. 11/27/20  Yes Lewie Chamber, MD  atorvastatin (LIPITOR) 40 MG tablet Take 1 tablet (40 mg total) by mouth daily. 11/24/20  Yes [name], Renda Rolls, MD  carvedilol (COREG) 3.125 MG tablet Take 1 tablet (3.125 mg total) by mouth 2 (two) times daily with a meal. 11/24/20  Yes [name], Renda Rolls, MD  clopidogrel (PLAVIX) 75 MG tablet Take 1 tablet (75 mg total) by mouth daily for 21 days.  11/27/20 12/18/20 Yes Lewie Chamber, MD  dapagliflozin propanediol (FARXIGA) 5 MG TABS tablet Take 1 tablet (5 mg total) by mouth daily. 11/18/20  Yes Marrion Coy, MD  hydrALAZINE (APRESOLINE) 50 MG tablet Take 1 tablet (50 mg total) by mouth 3 (three) times daily. 11/24/20  Yes Corky Downs, MD   Review of Systems  Constitutional:  Negative for appetite change and fatigue.  HENT:  Negative for congestion, postnasal drip and sore throat.   Eyes: Negative.   Respiratory:  Positive for cough and shortness of breath.   Cardiovascular:  Negative for chest pain, palpitations and leg swelling.  Gastrointestinal:  Negative for abdominal distention and abdominal pain.  Endocrine: Negative.   Musculoskeletal:  Positive for arthralgias (left knee). Negative for neck pain.  Skin: Negative.   Allergic/Immunologic: Negative.   Neurological:  Negative for dizziness and light-headedness.  Psychiatric/Behavioral:  Negative for dysphoric mood and sleep disturbance (sleeping on 1 pillows). The patient is not nervous/anxious.    Vitals:   12/02/20 1305  BP: (!) 162/85  Pulse: (!) 51  Resp: 18  SpO2: 100%  Weight: 198 lb 8 oz (90 kg)  Height: 5\' 9"  (1.753 m)   Wt Readings from Last 3 Encounters:  12/02/20 198 lb 8 oz (90 kg)  12/02/20 198 lb 6.4 oz (90 kg)  11/25/20 194 lb (88 kg)   Lab Results  Component Value Date   CREATININE 1.57 (H) 11/26/2020   CREATININE 1.86 (H) 11/25/2020   CREATININE 1.70 (H) 11/17/2020   Physical Exam Vitals and nursing note reviewed. Exam conducted with a chaperone present ("friend who is like a daughter").  Constitutional:      Appearance: Normal appearance.  HENT:     Head: Normocephalic and atraumatic.  Cardiovascular:     Rate and Rhythm: Regular rhythm. Bradycardia present.  Pulmonary:     Effort: Pulmonary effort is normal. No respiratory distress.     Breath sounds: No wheezing or rales.  Abdominal:     General: Abdomen is flat. There is no  distension.     Palpations: Abdomen is soft.  Musculoskeletal:        General: No tenderness.     Cervical back: Normal range of motion and neck supple.     Right lower leg: No edema.     Left lower leg: No edema.  Skin:    General: Skin is warm and dry.  Neurological:     General: No focal deficit present.     Mental Status: He is alert and oriented to person, place, and time.  Psychiatric:        Mood and Affect: Mood normal.        Behavior: Behavior normal.        Thought Content: Thought content normal.    Assessment & Plan:  1: Chronic heart failure with reduced ejection fraction- - NYHA class II - euvolemic today - weighing daily; reminded to call for an overnight weight gain of > 2 pounds or a weekly weight gain of >  5 pounds - not adding salt and friend ("daughter") does most of the cooking and doesn't cook with salt either; low sodium cookbook provided  - on GDMT of carvedilol and farxiga (although 5mg ) - HR limits carvedilol titration - discussed adding entresto/ MRA next visit and adjusting farxiga dose to 10mg  - BNP 11/11/20 was 616.5  2: HTN with CKD- - BP mildly elevated today but says that his knee is hurting - saw PCP ([name]) 11/24/20; returns 01/24/21 - BMP 11/26/20 reviewed and showed sodium 136, potassium 4.3, creatinine 1.57 and GFR 45  3: CAD- - CABG back in 2002 - sees cardiology 11/28/20) 12/06/20  4: Inguinal hernia- - saw surgeon earlier today & patient defers surgery at this time   Medication bottles reviewed.   Return in 1 month or sooner for any questions/problems before then.

## 2020-12-02 ENCOUNTER — Telehealth: Payer: Self-pay | Admitting: Cardiovascular Disease

## 2020-12-02 ENCOUNTER — Ambulatory Visit: Payer: Medicare HMO | Attending: Family | Admitting: Family

## 2020-12-02 ENCOUNTER — Encounter: Payer: Self-pay | Admitting: Surgery

## 2020-12-02 ENCOUNTER — Telehealth: Payer: Self-pay

## 2020-12-02 ENCOUNTER — Encounter: Payer: Self-pay | Admitting: Family

## 2020-12-02 ENCOUNTER — Other Ambulatory Visit: Payer: Self-pay

## 2020-12-02 ENCOUNTER — Ambulatory Visit (INDEPENDENT_AMBULATORY_CARE_PROVIDER_SITE_OTHER): Payer: Medicare HMO | Admitting: Surgery

## 2020-12-02 VITALS — BP 136/88 | HR 76 | Temp 97.8°F | Ht 69.0 in | Wt 198.4 lb

## 2020-12-02 VITALS — BP 162/85 | HR 51 | Resp 18 | Ht 69.0 in | Wt 198.5 lb

## 2020-12-02 DIAGNOSIS — M25562 Pain in left knee: Secondary | ICD-10-CM | POA: Diagnosis not present

## 2020-12-02 DIAGNOSIS — Z7982 Long term (current) use of aspirin: Secondary | ICD-10-CM | POA: Insufficient documentation

## 2020-12-02 DIAGNOSIS — I10 Essential (primary) hypertension: Secondary | ICD-10-CM | POA: Diagnosis not present

## 2020-12-02 DIAGNOSIS — K4090 Unilateral inguinal hernia, without obstruction or gangrene, not specified as recurrent: Secondary | ICD-10-CM | POA: Insufficient documentation

## 2020-12-02 DIAGNOSIS — M109 Gout, unspecified: Secondary | ICD-10-CM | POA: Insufficient documentation

## 2020-12-02 DIAGNOSIS — I5022 Chronic systolic (congestive) heart failure: Secondary | ICD-10-CM | POA: Diagnosis not present

## 2020-12-02 DIAGNOSIS — N189 Chronic kidney disease, unspecified: Secondary | ICD-10-CM | POA: Diagnosis not present

## 2020-12-02 DIAGNOSIS — Z79899 Other long term (current) drug therapy: Secondary | ICD-10-CM | POA: Diagnosis not present

## 2020-12-02 DIAGNOSIS — Z951 Presence of aortocoronary bypass graft: Secondary | ICD-10-CM | POA: Diagnosis not present

## 2020-12-02 DIAGNOSIS — I130 Hypertensive heart and chronic kidney disease with heart failure and stage 1 through stage 4 chronic kidney disease, or unspecified chronic kidney disease: Secondary | ICD-10-CM | POA: Insufficient documentation

## 2020-12-02 DIAGNOSIS — I2510 Atherosclerotic heart disease of native coronary artery without angina pectoris: Secondary | ICD-10-CM | POA: Diagnosis not present

## 2020-12-02 DIAGNOSIS — K4091 Unilateral inguinal hernia, without obstruction or gangrene, recurrent: Secondary | ICD-10-CM

## 2020-12-02 DIAGNOSIS — Z87891 Personal history of nicotine dependence: Secondary | ICD-10-CM | POA: Insufficient documentation

## 2020-12-02 NOTE — Telephone Encounter (Signed)
Faxed cardiac clearance to Dr. Timothy Gollan at [phone]. 

## 2020-12-02 NOTE — Telephone Encounter (Signed)
   [place] Pre-operative Risk Assessment    Patient Name: Benjamin Valentine  DOB: Jan 16, 1946 MRN: [id number]  HEARTCARE STAFF:  - IMPORTANT!!!!!! Under Visit Info/Reason for Call, type in Other and utilize the format Clearance MM/DD/YY or Clearance TBD. Do not use dashes or single digits. - Please review there is not already an duplicate clearance open for this procedure. - If request is for dental extraction, please clarify the # of teeth to be extracted. - If the patient is currently at the dentist's office, call Pre-Op Callback Staff (MA/nurse) to input urgent request.  - If the patient is not currently in the dentist office, please route to the Pre-Op pool.  Request for surgical clearance:  What type of surgery is being performed? Left inguinal hernia repair   When is this surgery scheduled? TBD   What type of clearance is required (medical clearance vs. Pharmacy clearance to hold med vs. Both)? both  Are there any medications that need to be held prior to surgery and how long? Not listed, please advise if needed  Practice name and name of physician performing surgery? [facility] - Dr Ronny Bacon   What is the office phone number? [phone]   7.   What is the office fax number? [phone]  8.   Anesthesia type (None, local, MAC, general) ? General    Caryl Pina Gerringer 12/02/2020, 11:07 AM  _________________________________________________________________   (provider comments below)

## 2020-12-02 NOTE — Patient Instructions (Signed)
We will request a cardiac clearance from Dr. Mariah Milling.   If you have any concerns or questions, please feel free to call our office.   Inguinal Hernia, Adult  An inguinal hernia is when fat or your intestines push through a weak spot in a muscle where your leg meets your lower belly (groin). This causes a bulge. This kind of hernia could also be: In your scrotum, if you are male. In folds of skin around your vagina, if you are male. There are three types of inguinal hernias: Hernias that can be pushed back into the belly (are reducible). This type rarely causes pain. Hernias that cannot be pushed back into the belly (are incarcerated). Hernias that cannot be pushed back into the belly and lose their blood supply (are strangulated). This type needs emergency surgery. What are the causes? This condition is caused by having a weak spot in the muscles or tissues in your groin. This develops over time. The hernia may poke through the weak spot when you strain your lower belly muscles all of a sudden, such as when you: Lift a heavy object. Strain to poop (have a bowel movement). Trouble pooping (constipation) can lead to straining. Cough. What increases the risk? This condition is more likely to develop in: Males. Pregnant females. People who: Are overweight. Work in jobs that require long periods of standing or heavy lifting. Have had an inguinal hernia before. Smoke or have lung disease. These factors can lead to long-term (chronic) coughing. What are the signs or symptoms? Symptoms may depend on the size of the hernia. Often, a small hernia has no symptoms. Symptoms of a larger hernia may include: A bulge in the groin area. This is easier to see when standing. You might not be able to see it when you are lying down. Pain or burning in the groin. This may get worse when you lift, strain, or cough. A dull ache or a feeling of pressure in the groin. An abnormal bulge in the scrotum, in  males. Symptoms of a strangulated inguinal hernia may include: A bulge in your groin that is very painful and tender to the touch. A bulge that turns red or purple. Fever, feeling like you may vomit (nausea), and vomiting. Not being able to poop or to pass gas. How is this treated? Treatment depends on the size of your hernia and whether you have symptoms. If you do not have symptoms, your doctor may have you watch your hernia carefully and have you come in for follow-up visits. If your hernia is large or if you have symptoms, you may need surgery to repair the hernia. Follow these instructions at home: Lifestyle Avoid lifting heavy objects. Avoid standing for long amounts of time. Do not smoke or use any products that contain nicotine or tobacco. If you need help quitting, ask your doctor. Stay at a healthy weight. Prevent trouble pooping You may need to take these actions to prevent or treat trouble pooping: Drink enough fluid to keep your pee (urine) pale yellow. Take over-the-counter or prescription medicines. Eat foods that are high in fiber. These include beans, whole grains, and fresh fruits and vegetables. Limit foods that are high in fat and sugar. These include fried or sweet foods. General instructions You may try to push your hernia back in place by very gently pressing on it when you are lying down. Do not try to push the bulge back in if it will not go in easily. Watch your hernia  for any changes in shape, size, or color. Tell your doctor if you see any changes. Take over-the-counter and prescription medicines only as told by your doctor. Keep all follow-up visits. Contact a doctor if: You have a fever or chills. You have new symptoms. Your symptoms get worse. Get help right away if: You have pain in your groin that gets worse all of a sudden. You have a bulge in your groin that: Gets bigger all of a sudden, and it does not get smaller after that. Turns red or  purple. Is painful when you touch it. You are a male, and you have: Sudden pain in your scrotum. A sudden change in the size of your scrotum. You cannot push the hernia back in place by very gently pressing on it when you are lying down. You feel like you may vomit, and that feeling does not go away. You keep vomiting. You have a fast heartbeat. You cannot poop or pass gas. These symptoms may be an emergency. Get help right away. Call your local emergency services (911 in the [place] Do not wait to see if the symptoms will go away. Do not drive yourself to the hospital. Summary An inguinal hernia is when fat or your intestines push through a weak spot in a muscle where your leg meets your lower belly (groin). This causes a bulge. If you do not have symptoms, you may not need treatment. If you have symptoms or a large hernia, you may need surgery. Avoid lifting heavy objects. Also, avoid standing for long amounts of time. Do not try to push the bulge back in if it will not go in easily. This information is not intended to replace advice given to you by your health care provider. Make sure you discuss any questions you have with your health care provider. Document Revised: 09/30/2019 Document Reviewed: 09/30/2019 Elsevier Patient Education  2022 [employer].

## 2020-12-02 NOTE — Telephone Encounter (Signed)
Primary Cardiologist:Timothy Mariah Milling, MD  Chart reviewed as part of pre-operative protocol coverage. Because of Benjamin Valentine's past medical history and time since last visit, he/she will require a follow-up visit in order to better assess preoperative cardiovascular risk.  Pre-op covering staff: - Please schedule appointment and call patient to inform them. - Please contact requesting surgeon's office via preferred method (i.e, phone, fax) to inform them of need for appointment prior to surgery.  If applicable, this message will also be routed to pharmacy pool and/or primary cardiologist for input on holding anticoagulant/antiplatelet agent as requested below so that this information is available at time of patient's appointment.   Ronney Asters, NP  12/02/2020, 11:34 AM

## 2020-12-02 NOTE — Patient Instructions (Signed)
Continue weighing daily and call for an overnight weight gain of > 2 pounds or a weekly weight gain of >5 pounds. 

## 2020-12-02 NOTE — Progress Notes (Signed)
Patient ID: Benjamin Valentine, male   DOB: 1945/03/24, 75 y.o.   MRN: [id number]  Chief Complaint: Left inguinal hernia  History of Present Illness Benjamin Valentine is a 75 y.o. male with a known left inguinal hernia over the last 3 to 4 years.  He denies pain, denies any new swelling or discomfort.  He denies any history of nausea, vomiting, fevers or chills.  He reports that when he voids sometimes he has to reduce/pushes side the hernia for initiating the stream.  He reports his bowel movements are normal.  He reports that it spontaneously reduces while resting at night, increases and enlarges during the day when upright.  He has no history of incarceration.  No difficulty with spontaneous or manual reduction.  Past Medical History Past Medical History:  Diagnosis Date   Arthritis    Coronary artery disease    Gout    Hypertension       Past Surgical History:  Procedure Laterality Date   CARDIAC SURGERY     CABG 2002   LAPAROSCOPIC APPENDECTOMY N/A 08/11/2018   Procedure: APPENDECTOMY LAPAROSCOPIC;  Surgeon: Henrene Dodge, MD;  Location: [facility] ORS;  Service: General;  Laterality: N/A;   RIGHT/LEFT HEART CATH AND CORONARY/GRAFT ANGIOGRAPHY N/A 11/15/2020   Procedure: RIGHT/LEFT HEART CATH AND CORONARY/GRAFT ANGIOGRAPHY;  Surgeon: Iran Ouch, MD;  Location: [facility] INVASIVE CV LAB;  Service: Cardiovascular;  Laterality: N/A;    No Known Allergies  Current Outpatient Medications  Medication Sig Dispense Refill   allopurinol (ZYLOPRIM) 100 MG tablet Take 0.5 tablets (50 mg total) by mouth daily. 30 tablet 3   aspirin EC 81 MG EC tablet Take 1 tablet (81 mg total) by mouth daily. Swallow whole. 30 tablet 11   atorvastatin (LIPITOR) 40 MG tablet Take 1 tablet (40 mg total) by mouth daily. 30 tablet 6   carvedilol (COREG) 3.125 MG tablet Take 1 tablet (3.125 mg total) by mouth 2 (two) times daily with a meal. 60 tablet 6   clopidogrel (PLAVIX) 75 MG tablet Take 1 tablet (75 mg total) by mouth  daily for 21 days. 21 tablet 0   dapagliflozin propanediol (FARXIGA) 5 MG TABS tablet Take 1 tablet (5 mg total) by mouth daily. 30 tablet 0   hydrALAZINE (APRESOLINE) 50 MG tablet Take 1 tablet (50 mg total) by mouth 3 (three) times daily. 90 tablet 6   No current facility-administered medications for this visit.    Family History History reviewed. No pertinent family history.    Social History Social History   Tobacco Use   Smoking status: Former   Smokeless tobacco: Never  Building services engineer Use: Never used  Substance Use Topics   Alcohol use: No   Drug use: No        Review of Systems  Constitutional: Negative.   HENT: Negative.    Eyes: Negative.   Respiratory: Negative.    Cardiovascular: Negative.   Gastrointestinal: Negative.   Genitourinary: Negative.   Skin: Negative.   Neurological: Negative.   Psychiatric/Behavioral: Negative.       Physical Exam Blood pressure 136/88, pulse 76, temperature 97.8 F (36.6 C), temperature source Oral, height 5\' 9"  (1.753 m), weight 198 lb 6.4 oz (90 kg), SpO2 96 %. Last Weight  Most recent update: 12/02/2020 10:14 AM    Weight  90 kg (198 lb 6.4 oz)             CONSTITUTIONAL: Well developed, and nourished, appropriately responsive and  aware without distress.   EYES: Sclera non-icteric.   EARS, NOSE, MOUTH AND THROAT: Mask worn.    Hearing is intact to voice.  NECK: Trachea is midline, and there is no jugular venous distension.  LYMPH NODES:  Lymph nodes in the neck are not enlarged. RESPIRATORY:  Lungs are clear, and breath sounds are equal bilaterally. Normal respiratory effort without pathologic use of accessory muscles. CARDIOVASCULAR: Heart is regular in rate and rhythm.  Sporadic skipped beats/PVCs GI: The abdomen is soft, nontender, and nondistended. There were no palpable masses. I did not appreciate hepatosplenomegaly. There were normal bowel sounds. GU: Large left scrotal/inguinal hernia, soft and  nontender.  Readily reducible.  No evidence of right sided hernia. MUSCULOSKELETAL:  Symmetrical muscle tone appreciated in all four extremities.    SKIN: Skin turgor is normal. No pathologic skin lesions appreciated.  NEUROLOGIC:  Motor and sensation appear grossly normal.  Cranial nerves are grossly without defect. PSYCH:  Alert and oriented to person, place and time. Affect is appropriate for situation.  Data Reviewed I have personally reviewed what is currently available of the patient's imaging, recent labs and medical records.   Labs:  CBC Latest Ref Rng & Units 11/25/2020 11/11/2020 06/16/2020  WBC 4.0 - 10.5 K/uL 9.1 8.1 9.1  Hemoglobin 13.0 - 17.0 g/dL 10.9 32.3 12.2(L)  Hematocrit 39.0 - 52.0 % 42.1 40.7 38.6  Platelets 150 - 400 K/uL 398 344 327   CMP Latest Ref Rng & Units 11/26/2020 11/25/2020 11/17/2020  Glucose 70 - 99 mg/dL 89 557(D) 93  BUN 8 - 23 mg/dL 22(G) 25(K) 27(C)  Creatinine 0.61 - 1.24 mg/dL 6.23(J) 6.28(B) 1.51(V)  Sodium 135 - 145 mmol/L 136 132(L) 135  Potassium 3.5 - 5.1 mmol/L 4.3 5.3(H) 4.6  Chloride 98 - 111 mmol/L 105 101 101  CO2 22 - 32 mmol/L 22 21(L) 24  Calcium 8.9 - 10.3 mg/dL 8.9 9.4 9.6  Total Protein 6.5 - 8.1 g/dL - 8.6(H) -  Total Bilirubin 0.3 - 1.2 mg/dL - 0.5 -  Alkaline Phos 38 - 126 U/L - 60 -  AST 15 - 41 U/L - 23 -  ALT 0 - 44 U/L - 16 -      Imaging:  Within last 24 hrs: No results found.  Assessment    Left inguinal hernia, despite its size minimally symptomatic. Patient Active Problem List   Diagnosis Date Noted   TIA (transient ischemic attack) 11/25/2020   Coronary artery disease involving native coronary artery of native heart without angina pectoris    Acute HFrEF (heart failure with reduced ejection fraction) (HCC)    Stage 3a chronic kidney disease (HCC) 11/11/2020   Hx of CABG 11/11/2020   Acute dyspnea 11/11/2020   Elevated troponin 11/11/2020   Gout 11/11/2020   Abdominal distention 11/11/2020    Hypertensive urgency 11/11/2020   Dyspnea 11/11/2020   Acute on chronic congestive heart failure (HCC)    AKI (acute kidney injury) (HCC)    Chronic gout due to renal impairment involving foot without tophus 04/13/2020   Primary osteoarthritis of both ankles 04/13/2020   Left groin hernia 01/26/2020   Coronary artery disease, non-occlusive 01/26/2020   Hip pain 01/26/2020   Essential hypertension 01/26/2020   Acute appendicitis 08/11/2018    Plan    Patient presented with his daughter today and at length we reviewed the risks of surgical repair in light of his cardiac disease.  We also discussed the risks of not repairing the hernia, the  potential for incarceration, pain, potential strangulation.  In light of risks in either direction, it appears that Benjamin Valentine would like to defer elective repair for now.  Despite this we will proceed with obtaining cardiac evaluation.  I made it very clear that should he become symptomatic, he needs to have this addressed on an urgent or semiurgent manner.  I believe he understands the risks associated with incarceration and strangulation.  I believe he also understands the potential debility or risks to his life with an operative repair. I will be glad to see this gentleman back in the near future, we will be glad to proceed with surgery should he be deemed through his cardiac clearance to have more than an acceptable risk.  Face-to-face time spent with the patient and accompanying care providers(if present) was 45 minutes, with more than 50% of the time spent counseling, educating, and coordinating care of the patient.    These notes generated with voice recognition software. I apologize for typographical errors.  Campbell Lerner M.D., FACS 12/02/2020, 11:11 AM

## 2020-12-06 ENCOUNTER — Ambulatory Visit: Payer: Medicare HMO | Admitting: Cardiovascular Disease

## 2020-12-08 NOTE — Telephone Encounter (Signed)
Called to s/w pt and per pt's daughter surgery is being put on hold for the time being. I assured her that I will make a note of this. Asked when they are ready to proceed to have the surgeon's office re-fax a new clearance request. Daughter thanked me for the call. I will send FYI to surgeon's office.

## 2020-12-14 ENCOUNTER — Telehealth: Payer: Self-pay

## 2020-12-14 DIAGNOSIS — M1712 Unilateral primary osteoarthritis, left knee: Secondary | ICD-10-CM | POA: Diagnosis not present

## 2020-12-14 NOTE — Telephone Encounter (Addendum)
Received call from Benjamin Valentine who is caretaker for Benjamin Valentine.  She states that Benjamin Valentine complains that at his cath  site that was done on 11/15/2020, he feels like a knot is getting bigger there.  Denies any pain.  Asked that she call [facility] cardiology at [phone] and make them aware of the problem at the groin site she voices understanding.  Tresa Endo RN [facility]

## 2020-12-17 ENCOUNTER — Telehealth: Payer: Self-pay | Admitting: Cardiovascular Disease

## 2020-12-17 NOTE — Telephone Encounter (Signed)
   [facility] Pre-operative Risk Assessment    Patient Name: ALEXA GOLEBIEWSKI  DOB: Jun 27, 1945 MRN: [id number]  HEARTCARE STAFF:  - IMPORTANT!!!!!! Under Visit Info/Reason for Call, type in Other and utilize the format Clearance MM/DD/YY or Clearance TBD. Do not use dashes or single digits. - Please review there is not already an duplicate clearance open for this procedure. - If request is for dental extraction, please clarify the # of teeth to be extracted. - If the patient is currently at the dentist's office, call Pre-Op Callback Staff (MA/nurse) to input urgent request.  - If the patient is not currently in the dentist office, please route to the Pre-Op pool.  Request for surgical clearance:  What type of surgery is being performed? Left inguinal hernia repair   When is this surgery scheduled? TBD  What type of clearance is required (medical clearance vs. Pharmacy clearance to hold med vs. Both)? both  Are there any medications that need to be held prior to surgery and how long? Not listed, please advise  Practice name and name of physician performing surgery? [facility] Dr Ronny Bacon    What is the office phone number? [phone]   7.   What is the office fax number? [phone]  8.   Anesthesia type (None, local, MAC, general) ? General    Caryl Pina Gerringer 12/17/2020, 2:50 PM  _________________________________________________________________   (provider comments below)

## 2020-12-21 NOTE — Telephone Encounter (Signed)
   Name: Benjamin Valentine  DOB: [birth year]  MRN: [id number]  Primary Cardiologist: Julien Nordmann, MD  Chart reviewed as part of pre-operative protocol coverage. Because of [name]'s past medical history and time since last visit, he will require a follow-up visit in order to better assess preoperative cardiovascular risk.  Pre-op covering staff: - Please schedule appointment and call patient to inform them. If patient already had an upcoming appointment within acceptable timeframe, please add "pre-op clearance" to the appointment notes so provider is aware. - Please contact requesting surgeon's office via preferred method (i.e, phone, fax) to inform them of need for appointment prior to surgery.  If applicable, this message will also be routed to pharmacy pool and/or primary cardiologist for input on holding anticoagulant/antiplatelet agent as requested below so that this information is available to the clearing provider at time of patient's appointment.   [place], [place]  12/21/2020, 1:08 PM

## 2020-12-22 ENCOUNTER — Telehealth: Payer: Self-pay | Admitting: Cardiovascular Disease

## 2020-12-22 NOTE — Telephone Encounter (Signed)
Left message for the pt to call the [facility] office and schedule an appt for pre op clearance with Dr. Mariah Milling or APP.

## 2020-12-22 NOTE — Telephone Encounter (Signed)
Pt has been scheduled to see Ward Givens, NP in the [place] office. I will forward clearance notes to NP for upcoming appt. Will send FYI to requesting office pt has appt 12/31/20.

## 2020-12-22 NOTE — Telephone Encounter (Signed)
Patient daughter calling to discuss cath site issues.  Patient was cathed in October and has a knot at site getting bigger and is seeing some bleeding .

## 2020-12-22 NOTE — Telephone Encounter (Signed)
Was able to reach back out to Benjamin Valentine, pt's daughter (DPR approved), Benjamin Valentine reports Benjamin Valentine c/o right groin area with "knot and bleeding" area is post-cath from 11/15/2020 during hospital admission.   Pt has also had recent hernia sx, but reports it is not the same site or area. Advised will need to be evaluated as it has been a month since heart cath and hospital f/u was cancel. Recommend rescheduling a f/u visit post-cath and to assess the site on insertion to the right groin. Benjamin Valentine agrees, appt made with Ward Givens, NP 11/18 at 10:55 am, advised to arrive 10-15 mins early for check-in, Benjamin Valentine verbalized understanding.   In the meantime, if area to right groin gets worse, unbearable pain, increase swelling, and/or bleeding, then please seek the ED for an evaluation, otherwise will see at next week's appt. Benjamin Valentine very thankful for the return call and will call back if need to reschedule appt.

## 2020-12-31 ENCOUNTER — Encounter: Payer: Self-pay | Admitting: Nurse Practitioner

## 2020-12-31 ENCOUNTER — Ambulatory Visit (INDEPENDENT_AMBULATORY_CARE_PROVIDER_SITE_OTHER): Payer: Medicare HMO | Admitting: Nurse Practitioner

## 2020-12-31 ENCOUNTER — Other Ambulatory Visit: Payer: Self-pay

## 2020-12-31 VITALS — BP 168/78 | HR 66 | Ht 69.0 in | Wt 205.0 lb

## 2020-12-31 DIAGNOSIS — G459 Transient cerebral ischemic attack, unspecified: Secondary | ICD-10-CM | POA: Diagnosis not present

## 2020-12-31 DIAGNOSIS — Z01810 Encounter for preprocedural cardiovascular examination: Secondary | ICD-10-CM | POA: Diagnosis not present

## 2020-12-31 DIAGNOSIS — I97630 Postprocedural hematoma of a circulatory system organ or structure following a cardiac catheterization: Secondary | ICD-10-CM | POA: Diagnosis not present

## 2020-12-31 DIAGNOSIS — E785 Hyperlipidemia, unspecified: Secondary | ICD-10-CM | POA: Diagnosis not present

## 2020-12-31 DIAGNOSIS — Z951 Presence of aortocoronary bypass graft: Secondary | ICD-10-CM | POA: Diagnosis not present

## 2020-12-31 DIAGNOSIS — I5022 Chronic systolic (congestive) heart failure: Secondary | ICD-10-CM

## 2020-12-31 DIAGNOSIS — I2510 Atherosclerotic heart disease of native coronary artery without angina pectoris: Secondary | ICD-10-CM

## 2020-12-31 DIAGNOSIS — N1831 Chronic kidney disease, stage 3a: Secondary | ICD-10-CM

## 2020-12-31 DIAGNOSIS — I10 Essential (primary) hypertension: Secondary | ICD-10-CM | POA: Diagnosis not present

## 2020-12-31 DIAGNOSIS — I5021 Acute systolic (congestive) heart failure: Secondary | ICD-10-CM | POA: Diagnosis not present

## 2020-12-31 DIAGNOSIS — S301XXA Contusion of abdominal wall, initial encounter: Secondary | ICD-10-CM

## 2020-12-31 MED ORDER — ISOSORBIDE MONONITRATE ER 30 MG PO TB24: 30.0000 mg | ORAL_TABLET | Freq: Every day | ORAL | 3 refills | Status: DC

## 2020-12-31 MED ORDER — FUROSEMIDE 20 MG PO TABS: 20.0000 mg | ORAL_TABLET | Freq: Every day | ORAL | 3 refills | Status: DC | PRN

## 2020-12-31 MED ORDER — DAPAGLIFLOZIN PROPANEDIOL 5 MG PO TABS: 5.0000 mg | ORAL_TABLET | Freq: Every day | ORAL | 6 refills | Status: DC

## 2020-12-31 NOTE — Progress Notes (Signed)
Office Visit    Patient Name: Benjamin Valentine Date of Encounter: 12/31/2020  Primary Care Provider:  Corky Downs, MD Primary Cardiologist:  Julien Nordmann, MD  Chief Complaint    75 year old-male with past medical history of CAD s/p bypass surgery in 2022, HTN, HLD, OA, gout, and HFrEF who presents to clinic for evaluation of recent cardiac catheretization site/hematoma.  Past Medical History    Past Medical History:  Diagnosis Date   Arthritis    Chronic HFrEF (heart failure with reduced ejection fraction) (HCC)    a. 10/2020 Echo: EF 25-30%, nl RV fxn, mild MR.   CKD (chronic kidney disease), stage III (HCC)    Coronary artery disease    a. 2002 s/p CABG x 2: LIMA->LAD, VG->RPDA; b. 11/2020 Cath: LM 20ost, LAD 162m, LCX 99p/m, 107m/d, OM1 30, RCA 100p - fills via collats from LAD. RPL1 fills via collats from LCX. VG->PDA 100, LIMA->LAD ok-->med rx.   Gout    Hyperlipidemia LDL goal <70    Hypertension    Ischemic cardiomyopathy    a. 10/2020 Echo: EF 25-30%.   Osteoarthritis    Past Surgical History:  Procedure Laterality Date   CARDIAC SURGERY     CABG 2002   LAPAROSCOPIC APPENDECTOMY N/A 08/11/2018   Procedure: APPENDECTOMY LAPAROSCOPIC;  Surgeon: Henrene Dodge, MD;  Location: [facility] ORS;  Service: General;  Laterality: N/A;   RIGHT/LEFT HEART CATH AND CORONARY/GRAFT ANGIOGRAPHY N/A 11/15/2020   Procedure: RIGHT/LEFT HEART CATH AND CORONARY/GRAFT ANGIOGRAPHY;  Surgeon: Iran Ouch, MD;  Location: [facility] INVASIVE CV LAB;  Service: Cardiovascular;  Laterality: N/A;    Allergies  No Known Allergies  History of Present Illness    75 year old-male with past medical history of CAD s/p bypass surgery in 2002, HTN, HLD, OA, gout, and HFrEF was recently hospitalized 11/11/2020 for elevated tropinin.  He presented to the hospital with increasing SOB along with ABD distention, and orthopena. Initial HStrop 59, EKG showed T wave inversion in the inferolateral leads. Echo  showed decreased LV function with EF 25-30%, mid-mod LV dilation, mild MR. Underwent R/L HC which showed significant 3-vessel disease with patent LIMA to LAD with occluded SVG to RCA and SVG to OM. RHC showed high normal filling pressure, moderate pulmonary hypertension, and mod to severe reduced CO. He was diuresed with lasix but had increase creatinine above baseline. He did well post-op and was discharged on 11/17/2020. He was admitted back to the hospital on 10/13 after presenting with new onset left arm and left knee numbness and weakness. Stroke workup was performed and was negative. He was seen by neurology and was thought symptoms were due to TIA. He was discharged with 21-day prescription for Plavix.   Today he is doing well. About 2 wks ago, he began experiencing oozing/bleeding from his R femoral cath site.  He describes the bleed as a constant trickle of blood that lasted for 3 days. The bleed stopped after applying pressure with a rag. Due to this bleeding, his planned L inguinal hernia repair was put on hold.  He has otw been doing well.  His dtr is present w/ him today.  He denies chest pain, palpitations, dyspnea, pnd, orthopnea, n, v, dizziness, syncope, edema, weight gain, or early satiety.  Home Medications    Current Outpatient Medications  Medication Sig Dispense Refill   allopurinol (ZYLOPRIM) 100 MG tablet Take 0.5 tablets (50 mg total) by mouth daily. (Patient taking differently: Take 100 mg by mouth daily.) 30 tablet  3   aspirin EC 81 MG EC tablet Take 1 tablet (81 mg total) by mouth daily. Swallow whole. 30 tablet 11   atorvastatin (LIPITOR) 40 MG tablet Take 1 tablet (40 mg total) by mouth daily. 30 tablet 6   carvedilol (COREG) 3.125 MG tablet Take 1 tablet (3.125 mg total) by mouth 2 (two) times daily with a meal. 60 tablet 6   furosemide (LASIX) 20 MG tablet Take 1 tablet (20 mg total) by mouth daily as needed (As needed for weight gain of 2 pounds overnight). 90 tablet 3    hydrALAZINE (APRESOLINE) 50 MG tablet Take 1 tablet (50 mg total) by mouth 3 (three) times daily. 90 tablet 6   isosorbide mononitrate (IMDUR) 30 MG 24 hr tablet Take 1 tablet (30 mg total) by mouth daily. 90 tablet 3   dapagliflozin propanediol (FARXIGA) 5 MG TABS tablet Take 1 tablet (5 mg total) by mouth daily. 30 tablet 6   No current facility-administered medications for this visit.     Review of Systems    Recent R groin bleeding - resolved.  Residual small hematoma.  He denies chest pain, palpitations, dyspnea, pnd, orthopnea, n, v, dizziness, syncope, edema, weight gain, or early satiety. All other systems reviewed and are otherwise negative except as noted above.   Physical Exam    VS:  BP (!) 168/78 (BP Location: Left Arm, Patient Position: Sitting, Cuff Size: Normal)   Pulse 66   Ht 5\' 9"  (1.753 m)   Wt 205 lb (93 kg)   SpO2 97%   BMI 30.27 kg/m  , BMI Body mass index is 30.27 kg/m.     GEN: obese, well developed, in no acute distress. HEENT: normal. Neck: Supple, no JVD, carotid bruits, or masses. Cardiac: RRR, no murmurs, rubs, or gallops. No clubbing, cyanosis, edema.  Radials2+/PT 1+ and equal bilaterally. Right femoral pulse 2+. Small hematoma to R groin cath site- 1 inch in diameter w/ bleeding/bruit. Respiratory:  Respirations regular and unlabored, clear to auscultation bilaterally. GI: Soft, nontender, nondistended, BS + x 4. MS: no deformity or atrophy. Skin: warm and dry, no rash. Neuro:  Strength and sensation are intact. Psych: Normal affect.  Accessory Clinical Findings    ECG personally reviewed by me today - Sinus rhythm/ Sinus arrhythmia with 1st degree block. Nonspecific T wave abnormality. LVH.  - no acute changes.  Lab Results  Component Value Date   WBC 9.1 11/25/2020   HGB 14.3 11/25/2020   HCT 42.1 11/25/2020   MCV 90.3 11/25/2020   PLT 398 11/25/2020   Lab Results  Component Value Date   CREATININE 1.57 (H) 11/26/2020   BUN 34 (H)  11/26/2020   NA 136 11/26/2020   K 4.3 11/26/2020   CL 105 11/26/2020   CO2 22 11/26/2020   Lab Results  Component Value Date   ALT 16 11/25/2020   AST 23 11/25/2020   ALKPHOS 60 11/25/2020   BILITOT 0.5 11/25/2020   Lab Results  Component Value Date   CHOL 110 11/12/2020   HDL 22 (L) 11/12/2020   LDLCALC 70 11/12/2020   TRIG 91 11/12/2020   CHOLHDL 5.0 11/12/2020    Lab Results  Component Value Date   HGBA1C 6.0 (H) 11/25/2020    Assessment & Plan    1.  CAD: S/p CABG x 2 in 2002.  He underwent R/L HC on 11/15/2020 which showed significant 3-vessel disease with patent LIMA to LAD with occluded SVG to RCA and SVG  to OM. RHC showed high normal filling pressure, moderate pulmonary hypertension, and mod to severe reduced CO. He has not had any chest pain since his cath. Start 30 mg Imdur for additional afterload reduction in the setting of LV dysfxn. Continue ASA, beta-blocker, Statin.   2. R groin hematoma:  pt notes that about 2 wks ago, he developed mild oozing from his cath site.  This persisted for ~ 3 days prior to resolving.  He notes that it was a very slow bleed/trickle.  He has been hemodynamically stable per home records and at no point did he experience groin pain, presyncope, or syncope.  He has a small R groin hematoma on exam, but not active bleeding or bruit.  No evidence of ecchymosis in the groin or over the retroperitoneum.  No further w/u indicated @ this time.  3.  HFrEF/Ischemic cardiomyopathy: Echo on 11/11/20 showed decreased LV function with EF 25-30%, mid-mod LV dilation, mild MR. He was diuresed inpatient but was discharged without prescription due to increase in creatine if 1.7 above his baseline of 1.5 During his prior admission on 10/14 his creatinine had improved to 1.57. Clinically the patient appears euvolemic. He has not had any noticeable swelling since leaving the hospital. His weight is up 7 LBs from last recorded weight, but wt has been steady @ home ~  196 lbs on his home scale.  His dtr is concerned that he sometimes cheats on his diet and develops mild edema.  We discussed the importance of daily weights, sodium restriction, medication compliance, and symptom reporting and he verbalizes understanding. Will prescribe lasix 20 mg PRN for weight gain of greater than 2 Lbs in a day. Check BMET today. Refill sent for Farxiga.  Cont ? blocker and hydralazine. Adding imdur for additional afterload reduction.  He was advised that if he is using prn lasix more than 2x/wk, he needs to notify us for repeat labs.  4. HTN: Blood pressure elevated in the office 168/78. Reviewing his home blood pressure logs he has been consistently hypertensive. His antihypertensive options are limited d/t his CKD. Will plan to start Start 30 mg Imdur as above. Continue hydralazine, beta-blocker.   5. CKD III: Most recent creatinine of 1.57 on 10/14. Check BMET today. Started on Lasix PRN.   6. HLD: LDL 70 on 11/12/20. Continue Statin therapy.  7.  TIA:  seen @ [facility] w/ weakness and dx w/ TIA by neuro.  Completed 3 wks of plavix.  Cont asa/statin.  Outpt neuro f/u.  8. Preoperative cardiovascular evaluation/L inguinal hernia:  pending L ing hernia repair.  As noted above, recent cath w/ 1/2 patent grafts and the distal RCA circulation filling via L  L collats from the dLAD and dOM.  EF 25-30%.  11% risk of adverse cardiac event given history.  With recent hospitalization requiring cath, ongoing med titration for CHF, and recent TIA, I think he is at a high risk for cardiovascular complications @ this time and would recommend deferral.  Pts dtr says that surgeon has delayed procedure.  9.  Disposition: Start Imdur 30 mg for elevated b/p. Check BMET to assess kidney function. Plan to follow-up in 1 month or sooner if nesessary.   Murray Hodgkins, NP 12/31/2020, 4:59 PM

## 2020-12-31 NOTE — Patient Instructions (Addendum)
Medication Instructions:  Your physician has recommended you make the following change in your medication:   AS NEEDED Furosemide 20 mg once a day as needed for weight gain of 2 pounds overnight. START Isosorbide mononitrate 30 mg once a day  Refills sent in for your Marcelline Deist  *If you need a refill on your cardiac medications before your next appointment, please call your pharmacy*   Lab Work: BMET today  If you have labs (blood work) drawn today and your tests are completely normal, you will receive your results only by: MyChart Message (if you have MyChart) OR A paper copy in the mail If you have any lab test that is abnormal or we need to change your treatment, we will call you to review the results.   Testing/Procedures: None   Follow-Up: At [facility], you and your health needs are our priority.  As part of our continuing mission to provide you with exceptional heart care, we have created designated Provider Care Teams.  These Care Teams include your primary Cardiologist (physician) and Advanced Practice Providers (APPs -  Physician Assistants and Nurse Practitioners) who all work together to provide you with the care you need, when you need it.  We recommend signing up for the patient portal called "MyChart".  Sign up information is provided on this After Visit Summary.  MyChart is used to connect with patients for Virtual Visits (Telemedicine).  Patients are able to view lab/test results, encounter notes, upcoming appointments, etc.  Non-urgent messages can be sent to your provider as well.   To learn more about what you can do with MyChart, go to [email or web address].    Your next appointment:   1 month(s)  The format for your next appointment:   In Person  Provider:   Julien Nordmann, MD or Nicolasa Ducking, NP

## 2021-01-01 LAB — BASIC METABOLIC PANEL
BUN/Creatinine Ratio: 15 (ref 10–24)
BUN: 20 mg/dL (ref 8–27)
CO2: 21 mmol/L (ref 20–29)
Calcium: 9.8 mg/dL (ref 8.6–10.2)
Chloride: 103 mmol/L (ref 96–106)
Creatinine, Ser: 1.33 mg/dL — ABNORMAL HIGH (ref 0.76–1.27)
Glucose: 92 mg/dL (ref 70–99)
Potassium: 4.9 mmol/L (ref 3.5–5.2)
Sodium: 140 mmol/L (ref 134–144)
eGFR: 55 mL/min/{1.73_m2} — ABNORMAL LOW (ref >59)

## 2021-01-04 NOTE — Progress Notes (Signed)
This patient came with TIA, and as part of TIA workup, A1C will guide [place] to find out and manage risk factors thus decrease the future stroke risk.

## 2021-01-04 NOTE — Progress Notes (Signed)
This patient presented with new onset of left leg weakness and numbness for suspected TIA, the symptoms can potentially get worse in following days, that is why he needed PT to evaluate during hospital stay.

## 2021-01-11 ENCOUNTER — Ambulatory Visit: Payer: Medicare HMO | Admitting: Family

## 2021-01-12 DIAGNOSIS — S161XXA Strain of muscle, fascia and tendon at neck level, initial encounter: Secondary | ICD-10-CM | POA: Diagnosis not present

## 2021-01-18 ENCOUNTER — Encounter: Payer: Self-pay | Admitting: Family

## 2021-01-18 ENCOUNTER — Other Ambulatory Visit: Payer: Self-pay

## 2021-01-18 ENCOUNTER — Ambulatory Visit: Payer: Medicare HMO | Attending: Family | Admitting: Family

## 2021-01-18 VITALS — BP 160/75 | HR 76 | Resp 18 | Ht 69.0 in | Wt 203.5 lb

## 2021-01-18 DIAGNOSIS — I130 Hypertensive heart and chronic kidney disease with heart failure and stage 1 through stage 4 chronic kidney disease, or unspecified chronic kidney disease: Secondary | ICD-10-CM | POA: Insufficient documentation

## 2021-01-18 DIAGNOSIS — Z79899 Other long term (current) drug therapy: Secondary | ICD-10-CM | POA: Insufficient documentation

## 2021-01-18 DIAGNOSIS — I10 Essential (primary) hypertension: Secondary | ICD-10-CM | POA: Diagnosis not present

## 2021-01-18 DIAGNOSIS — Z7984 Long term (current) use of oral hypoglycemic drugs: Secondary | ICD-10-CM | POA: Diagnosis not present

## 2021-01-18 DIAGNOSIS — K4090 Unilateral inguinal hernia, without obstruction or gangrene, not specified as recurrent: Secondary | ICD-10-CM | POA: Insufficient documentation

## 2021-01-18 DIAGNOSIS — I5022 Chronic systolic (congestive) heart failure: Secondary | ICD-10-CM | POA: Insufficient documentation

## 2021-01-18 DIAGNOSIS — I272 Pulmonary hypertension, unspecified: Secondary | ICD-10-CM | POA: Insufficient documentation

## 2021-01-18 DIAGNOSIS — I2510 Atherosclerotic heart disease of native coronary artery without angina pectoris: Secondary | ICD-10-CM

## 2021-01-18 DIAGNOSIS — M1990 Unspecified osteoarthritis, unspecified site: Secondary | ICD-10-CM | POA: Insufficient documentation

## 2021-01-18 DIAGNOSIS — K4091 Unilateral inguinal hernia, without obstruction or gangrene, recurrent: Secondary | ICD-10-CM | POA: Diagnosis not present

## 2021-01-18 DIAGNOSIS — N183 Chronic kidney disease, stage 3 unspecified: Secondary | ICD-10-CM | POA: Insufficient documentation

## 2021-01-18 DIAGNOSIS — I25810 Atherosclerosis of coronary artery bypass graft(s) without angina pectoris: Secondary | ICD-10-CM | POA: Insufficient documentation

## 2021-01-18 DIAGNOSIS — M109 Gout, unspecified: Secondary | ICD-10-CM | POA: Diagnosis not present

## 2021-01-18 NOTE — Patient Instructions (Signed)
Continue weighing daily and call for an overnight weight gain of 3 pounds or more or a weekly weight gain of more than 5 pounds.  °

## 2021-01-18 NOTE — Progress Notes (Signed)
Patient ID: Benjamin Valentine, male    DOB: 06/16/1945, 75 y.o.   MRN: [id number]  HPI  Benjamin Valentine is a 75 y/o male with a history of CAD (CABG), HTN, arthritis, gout, previous tobacco use and chronic heart failure.   Echo report from 11/11/20 reviewed and showed an EF of 25-30% along with mild Benjamin.   RHC/LHC done 11/15/20 and showed: Ost LM lesion is 20% stenosed.   Prox Cx to Mid Cx lesion is 99% stenosed.   Mid Cx to Dist Cx lesion is 99% stenosed.   1st Mrg lesion is 30% stenosed.   Mid LAD lesion is 100% stenosed.   Prox RCA lesion is 100% stenosed.   Origin lesion is 100% stenosed.   SVG.   LIMA graft was visualized by angiography and is normal in caliber.   The graft exhibits no disease. 1.  Significant underlying three-vessel coronary artery disease with occluded mid LAD, occluded proximal right coronary artery and subtotal occlusion of the mid left circumflex after the origin of a large high OM1 which supplies the majority of the left circumflex distribution. Patent LIMA to LAD with occluded SVG to RCA and SVG to OM. The RCA gets left-to-right collaterals. 2.  Left ventricular angiography was not performed due to chronic kidney disease. 3.  Right heart catheterization showed high normal filling pressures, moderate pulmonary hypertension and moderately to severely reduced cardiac output.  Admitted 11/25/20 due to left arm/leg weakness. Neurology consult obtained. Brain MRI negative. CT angio of head/neck was without occlusion. Discharged the following day. Admitted 11/11/20 due to dry cough, shortness of breath and abdominal bloating. Given duonebs and IV lasix with subsequent stoppage of diuretic due to renal disease. Left inguinal hernia noted. Cardiology and surgical consults obtained. Cath completed. Discharged after 6 days.   He presents today for a follow-up  visit with a chief complaint of slight weight gain. He says that this occurred over several weeks. He has no other symptoms and  specifically denies any difficulty sleeping, dizziness, abdominal distention, palpitations, pedal edema, chest pain, shortness of breath, cough or fatigue.   Was started on isosorbide by cardiology at his last visit. 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   Past Medical History:  Diagnosis Date   Arthritis    Chronic HFrEF (heart failure with reduced ejection fraction) (HCC)    a. 10/2020 Echo: EF 25-30%, nl RV fxn, mild Benjamin.   CKD (chronic kidney disease), stage III (HCC)    Coronary artery disease    a. 2002 s/p CABG x 2: LIMA->LAD, VG->RPDA; b. 11/2020 Cath: LM 20ost, LAD 152m, LCX 99p/m, 87m/d, OM1 30, RCA 100p - fills via collats from LAD. RPL1 fills via collats from LCX. VG->PDA 100, LIMA->LAD ok-->med rx.   Gout    Hyperlipidemia LDL goal <70    Hypertension    Ischemic cardiomyopathy    a. 10/2020 Echo: EF 25-30%.   Osteoarthritis    Past Surgical History:  Procedure Laterality Date   CARDIAC SURGERY     CABG 2002   LAPAROSCOPIC APPENDECTOMY N/A 08/11/2018   Procedure: APPENDECTOMY LAPAROSCOPIC;  Surgeon: Henrene Dodge, MD;  Location: [facility] ORS;  Service: General;  Laterality: N/A;   RIGHT/LEFT HEART CATH AND CORONARY/GRAFT ANGIOGRAPHY N/A 11/15/2020   Procedure: RIGHT/LEFT HEART CATH AND CORONARY/GRAFT ANGIOGRAPHY;  Surgeon: Iran Ouch, MD;  Location: [facility];  Service: Cardiovascular;  Laterality: N/A;   No family history on file. Social History  Tobacco Use   Smoking status: Former   Smokeless tobacco: Never  Substance Use Topics   Alcohol use: No   No Known Allergies  Prior to Admission medications   Medication Sig Start Date End Date Taking? Authorizing Provider  allopurinol (ZYLOPRIM) 100 MG tablet Take 0.5 tablets (50 mg total) by mouth daily. Patient taking differently: Take 100 mg by mouth daily. 11/27/20  Yes Dwyane Dee, MD  aspirin EC 81 MG EC tablet Take 1 tablet (81 mg total) by mouth daily. Swallow whole.  11/27/20  Yes Dwyane Dee, MD  atorvastatin (LIPITOR) 40 MG tablet Take 1 tablet (40 mg total) by mouth daily. 11/24/20  Yes [name], Viann Shove, MD  carvedilol (COREG) 3.125 MG tablet Take 1 tablet (3.125 mg total) by mouth 2 (two) times daily with a meal. 11/24/20  Yes [name], Viann Shove, MD  dapagliflozin propanediol (FARXIGA) 5 MG TABS tablet Take 1 tablet (5 mg total) by mouth daily. 12/31/20  Yes Theora Gianotti, NP  furosemide (LASIX) 20 MG tablet Take 1 tablet (20 mg total) by mouth daily as needed (As needed for weight gain of 2 pounds overnight). 12/31/20 03/31/21 Yes Theora Gianotti, NP  hydrALAZINE (APRESOLINE) 50 MG tablet Take 1 tablet (50 mg total) by mouth 3 (three) times daily. 11/24/20  Yes [name], Viann Shove, MD  isosorbide mononitrate (IMDUR) 30 MG 24 hr tablet Take 1 tablet (30 mg total) by mouth daily. 12/31/20 03/31/21 Yes Theora Gianotti, NP   Review of Systems  Constitutional:  Negative for appetite change and fatigue.  HENT:  Negative for congestion, postnasal drip and sore throat.   Eyes: Negative.   Respiratory:  Negative for cough, chest tightness and shortness of breath.   Cardiovascular:  Negative for chest pain, palpitations and leg swelling.  Gastrointestinal:  Negative for abdominal distention and abdominal pain.  Endocrine: Negative.   Musculoskeletal:  Positive for arthralgias (left knee). Negative for neck pain.  Skin: Negative.   Allergic/Immunologic: Negative.   Neurological:  Negative for dizziness and light-headedness.  Hematological:  Negative for adenopathy. Does not bruise/bleed easily.  Psychiatric/Behavioral:  Negative for dysphoric mood and sleep disturbance (sleeping on 1 pillows). The patient is not nervous/anxious.    Vitals:   01/18/21 1429  BP: (!) 160/75  Pulse: 76  Resp: 18  SpO2: 98%  Weight: 203 lb 8 oz (92.3 kg)  Height: 5\' 9"  (1.753 m)   Wt Readings from Last 3 Encounters:  01/18/21 203 lb 8 oz (92.3 kg)   12/31/20 205 lb (93 kg)  12/02/20 198 lb 8 oz (90 kg)   Lab Results  Component Value Date   CREATININE 1.33 (H) 12/31/2020   CREATININE 1.57 (H) 11/26/2020   CREATININE 1.86 (H) 11/25/2020   Physical Exam Vitals and nursing note reviewed. Exam conducted with a chaperone present ("friend who is like a daughter").  Constitutional:      Appearance: Normal appearance.  HENT:     Head: Normocephalic and atraumatic.  Cardiovascular:     Rate and Rhythm: Normal rate and regular rhythm.  Pulmonary:     Effort: Pulmonary effort is normal. No respiratory distress.     Breath sounds: No wheezing or rales.  Abdominal:     General: Abdomen is flat. There is no distension.     Palpations: Abdomen is soft.  Musculoskeletal:        General: No tenderness.     Cervical back: Normal range of motion and neck supple.     Right lower  leg: No edema.     Left lower leg: No edema.  Skin:    General: Skin is warm and dry.  Neurological:     General: No focal deficit present.     Mental Status: He is alert and oriented to person, place, and time.  Psychiatric:        Mood and Affect: Mood normal.        Behavior: Behavior normal.        Thought Content: Thought content normal.    Assessment & Plan:  1: Chronic heart failure with reduced ejection fraction- - NYHA class I - euvolemic today - weighing daily; reminded to call for an overnight weight gain of > 2 pounds or a weekly weight gain of > 5 pounds - weight up 5 pounds from last visit here 6 weeks ago - not adding salt and friend ("daughter") does most of the cooking and doesn't cook with salt either - on GDMT of carvedilol and farxiga (although 5mg ) - patient leaving to go out of state on vacation tomorrow so will adjust anything today - BNP 11/11/20 was 616.5  2: HTN with CKD- - BP mildly elevated and home log shows mildly elevated BP's at home too with SBP in the 160's; has been taking isosorbide since last cardiology visit - saw PCP  ([place] [facility]) 11/24/20; returns 01/24/21 - BMP 12/31/20 reviewed and showed sodium 140, potassium 4.9, creatinine 1.33 and GFR 55  3: CAD- - CABG back in 2002 - saw cardiology Sharolyn Douglas) 12/31/20 & isosorbide 30mg  started  4: Inguinal hernia- - patient defers surgery at this time   Medication bottles reviewed.   Return in 2 months or sooner for any questions/problems before then.

## 2021-01-24 ENCOUNTER — Ambulatory Visit: Payer: Medicare HMO | Admitting: Internal Medicine

## 2021-01-28 ENCOUNTER — Other Ambulatory Visit: Payer: Self-pay | Admitting: *Deleted

## 2021-01-31 ENCOUNTER — Other Ambulatory Visit: Payer: Self-pay | Admitting: Family

## 2021-01-31 MED ORDER — ALLOPURINOL 100 MG PO TABS: 100.0000 mg | ORAL_TABLET | Freq: Every day | ORAL | 0 refills | Status: DC

## 2021-01-31 NOTE — Progress Notes (Signed)
Refilled allopurinol 

## 2021-02-16 ENCOUNTER — Ambulatory Visit (INDEPENDENT_AMBULATORY_CARE_PROVIDER_SITE_OTHER): Payer: Medicare HMO | Admitting: Cardiovascular Disease

## 2021-02-16 ENCOUNTER — Other Ambulatory Visit: Payer: Self-pay

## 2021-02-16 ENCOUNTER — Encounter: Payer: Self-pay | Admitting: Cardiovascular Disease

## 2021-02-16 VITALS — BP 138/70 | HR 81 | Ht 69.0 in | Wt 208.0 lb

## 2021-02-16 DIAGNOSIS — E785 Hyperlipidemia, unspecified: Secondary | ICD-10-CM

## 2021-02-16 DIAGNOSIS — I25118 Atherosclerotic heart disease of native coronary artery with other forms of angina pectoris: Secondary | ICD-10-CM

## 2021-02-16 DIAGNOSIS — G459 Transient cerebral ischemic attack, unspecified: Secondary | ICD-10-CM

## 2021-02-16 DIAGNOSIS — I10 Essential (primary) hypertension: Secondary | ICD-10-CM

## 2021-02-16 DIAGNOSIS — I5022 Chronic systolic (congestive) heart failure: Secondary | ICD-10-CM

## 2021-02-16 NOTE — Progress Notes (Signed)
Cardiology Office Note  Date:  02/16/2021   ID:  Benjamin Valentine, Benjamin Valentine Jul 14, 1945, MRN [id number]  PCP:  Cletis Athens, MD   Chief Complaint  Patient presents with   1 month follow up     "Doing well." Medications reviewed by the patient verbally.     HPI:  77 year old-male with past medical history of  CAD s/p bypass surgery in 2022,  HTN, HLD, OA, gout, and HFrEF  Who presents for follow-up of his coronary disease, history of CABG  This is my first time seeing him in clinic   hospitalized 11/11/2020 for elevated tropinin.   SOB along with ABD distention, and orthopena.   Echo showed decreased LV function with EF 25-30%, mid-mod LV dilation, mild MR.    R/L HC which showed significant 3-vessel disease with patent LIMA to LAD with occluded SVG to RCA and SVG to OM. RHC showed high normal filling pressure, moderate pulmonary hypertension, and mod to severe reduced CO.   diuresed with lasix  discharged on 11/17/2020.   admitted back to the hospital on 11/25/20 after presenting with new onset left arm and left knee numbness and weakness.  Stroke workup was performed and was negative.  He was seen by neurology and was thought symptoms were due to TIA.  He was discharged with 21-day prescription for Plavix.    When last seen in clinic December 31, 2020, had oozing/bleeding from his R femoral cath site.   Medication changes on last visit started on Imdur 30 daily, Lasix 20 daily as needed  Weight at home 195 to 197 pounds If higher weight, takes lasix Overall reports he feels well, family reports he behaves  Labs reviewed A1c 6.0 Total cholesterol 110  PMH:   has a past medical history of Arthritis, Chronic HFrEF (heart failure with reduced ejection fraction) ([place]), CKD (chronic kidney disease), stage III ([place]), Coronary artery disease, Gout, Hyperlipidemia LDL goal <70, Hypertension, Ischemic cardiomyopathy, and Osteoarthritis.  PSH:    Past Surgical History:  Procedure  Laterality Date   CARDIAC SURGERY     CABG 2002   LAPAROSCOPIC APPENDECTOMY N/A 08/11/2018   Procedure: APPENDECTOMY LAPAROSCOPIC;  Surgeon: Olean Ree, MD;  Location: [facility];  Service: General;  Laterality: N/A;   RIGHT/LEFT HEART CATH AND CORONARY/GRAFT ANGIOGRAPHY N/A 11/15/2020   Procedure: RIGHT/LEFT HEART CATH AND CORONARY/GRAFT ANGIOGRAPHY;  Surgeon: Wellington Hampshire, MD;  Location: [facility];  Service: Cardiovascular;  Laterality: N/A;    Current Outpatient Medications  Medication Sig Dispense Refill   allopurinol (ZYLOPRIM) 100 MG tablet Take 1 tablet (100 mg total) by mouth daily. 30 tablet 0   aspirin EC 81 MG EC tablet Take 1 tablet (81 mg total) by mouth daily. Swallow whole. 30 tablet 11   atorvastatin (LIPITOR) 40 MG tablet Take 1 tablet (40 mg total) by mouth daily. 30 tablet 6   carvedilol (COREG) 3.125 MG tablet Take 1 tablet (3.125 mg total) by mouth 2 (two) times daily with a meal. 60 tablet 6   dapagliflozin propanediol (FARXIGA) 5 MG TABS tablet Take 1 tablet (5 mg total) by mouth daily. 30 tablet 6   furosemide (LASIX) 20 MG tablet Take 1 tablet (20 mg total) by mouth daily as needed (As needed for weight gain of 2 pounds overnight). 90 tablet 3   hydrALAZINE (APRESOLINE) 50 MG tablet Take 1 tablet (50 mg total) by mouth 3 (three) times daily. 90 tablet 6   isosorbide mononitrate (IMDUR) 30 MG 24 hr  tablet Take 1 tablet (30 mg total) by mouth daily. 90 tablet 3   No current facility-administered medications for this visit.     Allergies:   Patient has no known allergies.   Social History:  The patient  reports that he has quit smoking. He has never used smokeless tobacco. He reports that he does not drink alcohol and does not use drugs.   Family History:   family history is not on file.    Review of Systems: Review of Systems  Constitutional: Negative.   HENT: Negative.    Respiratory: Negative.    Cardiovascular: Negative.    Gastrointestinal: Negative.   Musculoskeletal: Negative.   Neurological: Negative.   Psychiatric/Behavioral: Negative.    All other systems reviewed and are negative.   PHYSICAL EXAM: VS:  BP 138/70 (BP Location: Left Arm, Patient Position: Sitting, Cuff Size: Normal)    Pulse 81    Ht 5\' 9"  (1.753 m)    Wt 208 lb (94.3 kg)    SpO2 98%    BMI 30.72 kg/m  , BMI Body mass index is 30.72 kg/m. GEN: Well nourished, well developed, in no acute distress HEENT: normal Neck: no JVD, carotid bruits, or masses Cardiac: RRR; no murmurs, rubs, or gallops,no edema  Respiratory:  clear to auscultation bilaterally, normal work of breathing GI: soft, nontender, nondistended, + BS MS: no deformity or atrophy Skin: warm and dry, no rash Neuro:  Strength and sensation are intact Psych: euthymic mood, full affect   Recent Labs: 11/11/2020: B Natriuretic Peptide 616.5 11/25/2020: ALT 16; Hemoglobin 14.3; Platelets 398 12/31/2020: BUN 20; Creatinine, Ser 1.33; Potassium 4.9; Sodium 140    Lipid Panel Lab Results  Component Value Date   CHOL 110 11/12/2020   HDL 22 (L) 11/12/2020   LDLCALC 70 11/12/2020   TRIG 91 11/12/2020      Wt Readings from Last 3 Encounters:  02/16/21 208 lb (94.3 kg)  01/18/21 203 lb 8 oz (92.3 kg)  12/31/20 205 lb (93 kg)      ASSESSMENT AND PLAN:  Problem List Items Addressed This Visit       Cardiology Problems   TIA (transient ischemic attack)   Essential hypertension   Coronary artery disease involving native coronary artery of native heart without angina pectoris - Primary   Other Visit Diagnoses     Chronic systolic heart failure (HCC)       Hyperlipidemia LDL goal <70          Ischemic cardiomyopathy Appears euvolemic, recommended that he continue carvedilol Farxiga hydralazine isosorbide Takes Lasix as needed based off weight gain Family adamant weight has been stable at home 195 up to [place] relatively strict diet  Coronary disease  with history of CABG stable angina Denies anginal symptoms On beta-blocker statin nitrate Recent cardiac catheterization results discussed, medical management recommended  Hyperlipidemia Cholesterol is at goal on the current lipid regimen. No changes to the medications were made.  Essential hypertension Blood pressure is well controlled on today's visit. No changes made to the medications.    Total encounter time more than 25 minutes  Greater than 50% was spent in counseling and coordination of care with the patient    Signed, Esmond Plants, M.D., Ph.D. [place], [place]

## 2021-02-16 NOTE — Patient Instructions (Signed)
Medication Instructions:  No changes  If you need a refill on your cardiac medications before your next appointment, please call your pharmacy.    Lab work: No new labs needed   Testing/Procedures: No new testing needed   Follow-Up: At [facility], you and your health needs are our priority.  As part of our continuing mission to provide you with exceptional heart care, we have created designated Provider Care Teams.  These Care Teams include your primary Cardiologist (physician) and Advanced Practice Providers (APPs -  Physician Assistants and Nurse Practitioners) who all work together to provide you with the care you need, when you need it.  You will need a follow up appointment in 6 months  Providers on your designated Care Team:   Christopher Berge, NP Ryan Dunn, [name] Cadence Furth, [name]  COVID-19 Vaccine Information can be found at: [email or web address] For questions related to vaccine distribution or appointments, please email [email or web address] or call [phone].   

## 2021-02-26 ENCOUNTER — Other Ambulatory Visit: Payer: Self-pay | Admitting: Family

## 2021-03-22 ENCOUNTER — Other Ambulatory Visit: Payer: Self-pay

## 2021-03-22 ENCOUNTER — Ambulatory Visit: Payer: Medicare HMO | Attending: Family | Admitting: Family

## 2021-03-22 ENCOUNTER — Encounter: Payer: Self-pay | Admitting: Family

## 2021-03-22 VITALS — BP 152/81 | HR 69 | Resp 18 | Ht 69.0 in | Wt 203.6 lb

## 2021-03-22 DIAGNOSIS — K4090 Unilateral inguinal hernia, without obstruction or gangrene, not specified as recurrent: Secondary | ICD-10-CM | POA: Diagnosis not present

## 2021-03-22 DIAGNOSIS — I130 Hypertensive heart and chronic kidney disease with heart failure and stage 1 through stage 4 chronic kidney disease, or unspecified chronic kidney disease: Secondary | ICD-10-CM | POA: Diagnosis not present

## 2021-03-22 DIAGNOSIS — I5022 Chronic systolic (congestive) heart failure: Secondary | ICD-10-CM | POA: Diagnosis not present

## 2021-03-22 DIAGNOSIS — M1990 Unspecified osteoarthritis, unspecified site: Secondary | ICD-10-CM | POA: Insufficient documentation

## 2021-03-22 DIAGNOSIS — I10 Essential (primary) hypertension: Secondary | ICD-10-CM

## 2021-03-22 DIAGNOSIS — Z951 Presence of aortocoronary bypass graft: Secondary | ICD-10-CM | POA: Insufficient documentation

## 2021-03-22 DIAGNOSIS — K4091 Unilateral inguinal hernia, without obstruction or gangrene, recurrent: Secondary | ICD-10-CM

## 2021-03-22 DIAGNOSIS — M109 Gout, unspecified: Secondary | ICD-10-CM | POA: Insufficient documentation

## 2021-03-22 DIAGNOSIS — I2510 Atherosclerotic heart disease of native coronary artery without angina pectoris: Secondary | ICD-10-CM | POA: Insufficient documentation

## 2021-03-22 DIAGNOSIS — Z87891 Personal history of nicotine dependence: Secondary | ICD-10-CM | POA: Insufficient documentation

## 2021-03-22 MED ORDER — DAPAGLIFLOZIN PROPANEDIOL 5 MG PO TABS
10.0000 mg | ORAL_TABLET | Freq: Every day | ORAL | 5 refills | Status: DC
Start: 2021-03-22 — End: 2021-09-27

## 2021-03-22 MED ORDER — ISOSORBIDE MONONITRATE ER 30 MG PO TB24: 30.0000 mg | ORAL_TABLET | Freq: Every day | ORAL | 5 refills | Status: DC

## 2021-03-22 NOTE — Patient Instructions (Signed)
[facility] will be moving around the corner to [address] mid-February. Our phone number will remain the same.  On the current bottle of Farxiga, start taking 2 at a time.  On the new bottle of Farxiga, he will take one at a time.  Do not take the naproxen (big bottle).  Next visit, we will check labs and depending on that we may start spironolactone.  Isosorbide and new farxiga dose was sent to your pharmacy.

## 2021-03-22 NOTE — Progress Notes (Signed)
Patient ID: Benjamin Valentine, male    DOB: June 10, 1945, 76 y.o.   MRN: [id number]   Mr Iwanowski is a 76 y/o male with a history of CAD (CABG), HTN, arthritis, gout, previous tobacco use and chronic heart failure.   Echo report from 11/11/20 reviewed and showed an EF of 25-30% along with mild MR.   RHC/LHC done 11/15/20 and showed: Ost LM lesion is 20% stenosed.   Prox Cx to Mid Cx lesion is 99% stenosed.   Mid Cx to Dist Cx lesion is 99% stenosed.   1st Mrg lesion is 30% stenosed.   Mid LAD lesion is 100% stenosed.   Prox RCA lesion is 100% stenosed.   Origin lesion is 100% stenosed.   SVG.   LIMA graft was visualized by angiography and is normal in caliber.   The graft exhibits no disease. 1.  Significant underlying three-vessel coronary artery disease with occluded mid LAD, occluded proximal right coronary artery and subtotal occlusion of the mid left circumflex after the origin of a large high OM1 which supplies the majority of the left circumflex distribution. Patent LIMA to LAD with occluded SVG to RCA and SVG to OM. The RCA gets left-to-right collaterals. 2.  Left ventricular angiography was not performed due to chronic kidney disease. 3.  Right heart catheterization showed high normal filling pressures, moderate pulmonary hypertension and moderately to severely reduced cardiac output.  Admitted 11/25/20 due to left arm/leg weakness. Neurology consult obtained. Brain MRI negative. CT angio of head/neck was without occlusion. Discharged the following day. Admitted 11/11/20 due to dry cough, shortness of breath and abdominal bloating. Given duonebs and IV lasix with subsequent stoppage of diuretic due to renal disease. Left inguinal hernia noted. Cardiology and surgical consults obtained. Cath completed. Discharged after 6 days.   He presents today for a follow-up  visit with a chief complaint of mild fatigue with moderate exertion. He says he is getting stronger and needing to rest less, he  overall feels "good".  He has no other symptoms and specifically denies any difficulty sleeping, dizziness, abdominal distention, palpitations, pedal edema, chest pain, shortness of breath, cough.   Ran out of his isosorbide x 4 days. Weighs daily, and reports no fluctuations of his weight.   Past Medical History:  Diagnosis Date   Arthritis    Chronic HFrEF (heart failure with reduced ejection fraction) ([place])    a. 10/2020 Echo: EF 25-30%, nl RV fxn, mild MR.   CKD (chronic kidney disease), stage III ([place])    Coronary artery disease    a. 2002 s/p CABG x 2: LIMA->LAD, VG->RPDA; b. 11/2020 Cath: LM 20ost, LAD 149m, LCX 99p/m, 71m/d, OM1 30, RCA 100p - fills via collats from LAD. RPL1 fills via collats from LCX. VG->PDA 100, LIMA->LAD ok-->med rx.   Gout    Hyperlipidemia LDL goal <70    Hypertension    Ischemic cardiomyopathy    a. 10/2020 Echo: EF 25-30%.   Osteoarthritis    Past Surgical History:  Procedure Laterality Date   CARDIAC SURGERY     CABG 2002   LAPAROSCOPIC APPENDECTOMY N/A 08/11/2018   Procedure: APPENDECTOMY LAPAROSCOPIC;  Surgeon: Olean Ree, MD;  Location: [facility];  Service: General;  Laterality: N/A;   RIGHT/LEFT HEART CATH AND CORONARY/GRAFT ANGIOGRAPHY N/A 11/15/2020   Procedure: RIGHT/LEFT HEART CATH AND CORONARY/GRAFT ANGIOGRAPHY;  Surgeon: Wellington Hampshire, MD;  Location: [facility];  Service: Cardiovascular;  Laterality: N/A;   No family history on file.  Social History   Tobacco Use   Smoking status: Former   Smokeless tobacco: Never  Substance Use Topics   Alcohol use: No   No Known Allergies  Prior to Admission medications   Medication Sig Start Date End Date Taking? Authorizing Provider  allopurinol (ZYLOPRIM) 100 MG tablet Take 0.5 tablets (50 mg total) by mouth daily. Patient taking differently: Take 100 mg by mouth daily. 11/27/20  Yes Dwyane Dee, MD  aspirin EC 81 MG EC tablet Take 1 tablet (81 mg total) by mouth daily. Swallow  whole. 11/27/20  Yes Dwyane Dee, MD  atorvastatin (LIPITOR) 40 MG tablet Take 1 tablet (40 mg total) by mouth daily. 11/24/20  Yes [name], Viann Shove, MD  carvedilol (COREG) 3.125 MG tablet Take 1 tablet (3.125 mg total) by mouth 2 (two) times daily with a meal. 11/24/20  Yes [name], Viann Shove, MD  dapagliflozin propanediol (FARXIGA) 5 MG TABS tablet Take 1 tablet (5 mg total) by mouth daily. 12/31/20  Yes Theora Gianotti, NP  furosemide (LASIX) 20 MG tablet Take 1 tablet (20 mg total) by mouth daily as needed (As needed for weight gain of 2 pounds overnight). 12/31/20 03/31/21 Yes Theora Gianotti, NP  hydrALAZINE (APRESOLINE) 50 MG tablet Take 1 tablet (50 mg total) by mouth 3 (three) times daily. 11/24/20  Yes [name], Viann Shove, MD  isosorbide mononitrate (IMDUR) 30 MG 24 hr tablet Take 1 tablet (30 mg total) by mouth daily. 12/31/20 03/31/21 Yes Theora Gianotti, NP   Review of Systems  Constitutional:  Positive for fatigue. Negative for appetite change and diaphoresis.  HENT:  Negative for congestion, postnasal drip and sore throat.   Eyes: Negative.   Respiratory:  Negative for cough, chest tightness and shortness of breath.   Cardiovascular:  Negative for chest pain, palpitations and leg swelling.  Gastrointestinal:  Negative for abdominal distention and abdominal pain.  Endocrine: Negative.   Musculoskeletal:  Positive for arthralgias (neck pain). Negative for neck pain.  Skin: Negative.   Allergic/Immunologic: Negative.   Neurological:  Negative for dizziness and light-headedness.  Hematological:  Negative for adenopathy. Does not bruise/bleed easily.  Psychiatric/Behavioral:  Negative for dysphoric mood and sleep disturbance (sleeping on 1 pillows). The patient is not nervous/anxious.    Vitals:   03/22/21 1330  BP: (!) 152/81  Pulse: 69  Resp: 18  SpO2: 99%  Weight: 203 lb 9 oz (92.3 kg)  Height: 5\' 9"  (1.753 m)   Wt Readings from Last 3 Encounters:   03/22/21 203 lb 9 oz (92.3 kg)  02/16/21 208 lb (94.3 kg)  01/18/21 203 lb 8 oz (92.3 kg)   Lab Results  Component Value Date   CREATININE 1.33 (H) 12/31/2020   CREATININE 1.57 (H) 11/26/2020   CREATININE 1.86 (H) 11/25/2020   Physical Exam Vitals and nursing note reviewed. Exam conducted with a chaperone present ("friend who is like a daughter").  Constitutional:      General: He is not in acute distress.    Appearance: Normal appearance.  HENT:     Head: Normocephalic and atraumatic.  Cardiovascular:     Rate and Rhythm: Normal rate and regular rhythm.  Pulmonary:     Effort: Pulmonary effort is normal. No respiratory distress.     Breath sounds: No wheezing or rales.  Abdominal:     General: Abdomen is flat. There is no distension.     Palpations: Abdomen is soft.  Musculoskeletal:        General: No tenderness.  Cervical back: Normal range of motion and neck supple. No rigidity.     Right lower leg: No edema.     Left lower leg: No edema.  Skin:    General: Skin is warm and dry.  Neurological:     General: No focal deficit present.     Mental Status: He is alert and oriented to person, place, and time.  Psychiatric:        Mood and Affect: Mood normal.        Behavior: Behavior normal.        Thought Content: Thought content normal.    Assessment & Plan:  1: Chronic heart failure with reduced ejection fraction- - NYHA class II - euvolemic today - weighing daily; reminded to call for an overnight weight gain of > 2 pounds or a weekly weight gain of > 5 pounds - weight 203 today, unchanged from last visit - not adding salt and friend ("daughter") does most of the cooking and doesn't cook with salt either - on GDMT of carvedilol and farxiga  - will increase farxiga dose to 10 mg (was on 5 mg) - checks labs next visit - anticipate starting MRA next appointment depending on labs - BNP 11/11/20 was 616.5  2: HTN with CKD- - BP152/81, out of isosorbide x 4  days - refill send for isosorbide - patient has had difficulty getting in touch with PCP, called and made f/u appointment for him for next week (2/15) - BMP 12/31/20 reviewed and showed sodium 140, potassium 4.9, creatinine 1.33 and GFR 55  3: CAD- - CABG back in 2002 - saw cardiology Sharolyn Douglas) 12/31/20 & isosorbide 30mg  started  4: Inguinal hernia- - patient defers surgery at this time   Medication bottles reviewed.   Return in 1 months or sooner for any questions/problems before then.

## 2021-03-28 IMAGING — CT CT ANGIO CHEST-ABD-PELV FOR DISSECTION W/ AND WO/W CM
2 of 7 series · 12 of 46 positions shown, 14 images · IV contrast (APPLIED)
Comparison: No prior CT

CLINICAL DATA: 73-year-old male with a history of chest pain and
abdominal pain

EXAM:
CT ANGIOGRAPHY CHEST, ABDOMEN AND PELVIS
TECHNIQUE: Multidetector CT imaging through the chest, abdomen and pelvis was
performed using the standard protocol during bolus administration of
intravenous contrast. Multiplanar reconstructed images and MIPs were
obtained and reviewed to evaluate the vascular anatomy.
CONTRAST:  100mL D51GNW-YH4 IOPAMIDOL (D51GNW-YH4) INJECTION 76%

[Series 5: axial arterial · axial · arterial · 0.75mm/px · z∈[-382,+194]mm · 9 of 222 slices shown, 11 images]
[im 15/222  soft-tissue]
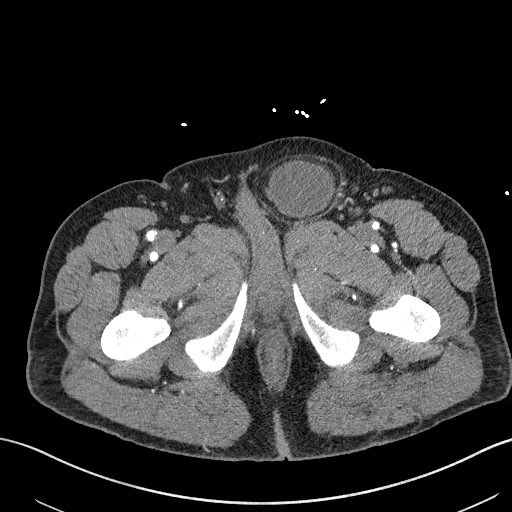
[im 15/222  bone]
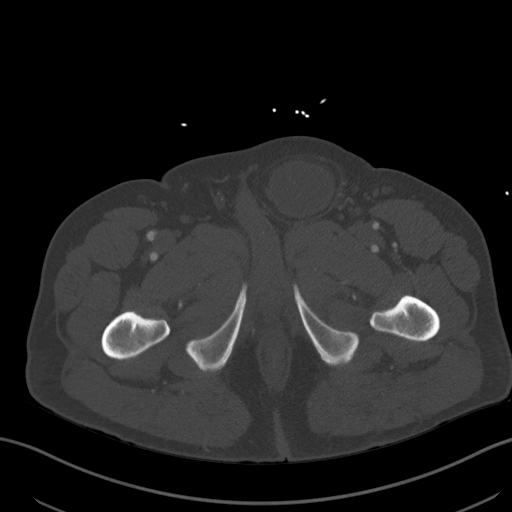
[im 45/222  soft-tissue]
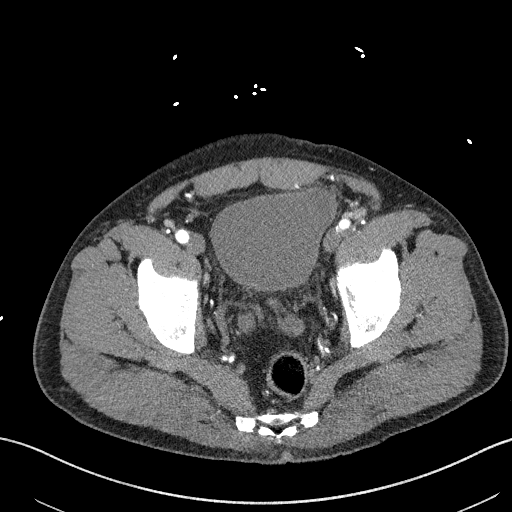
[im 59/222  soft-tissue]
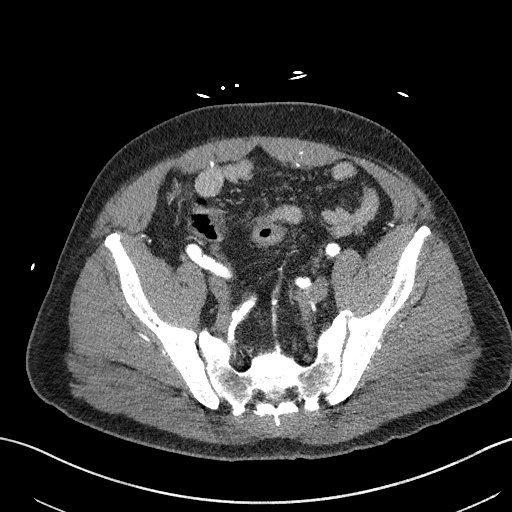
[im 89/222  soft-tissue]
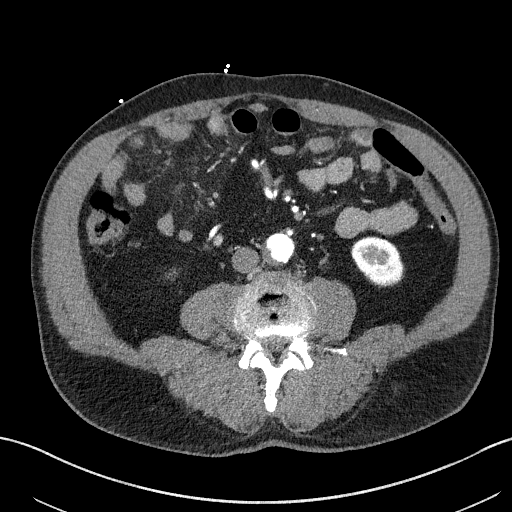
[im 118/222  soft-tissue]
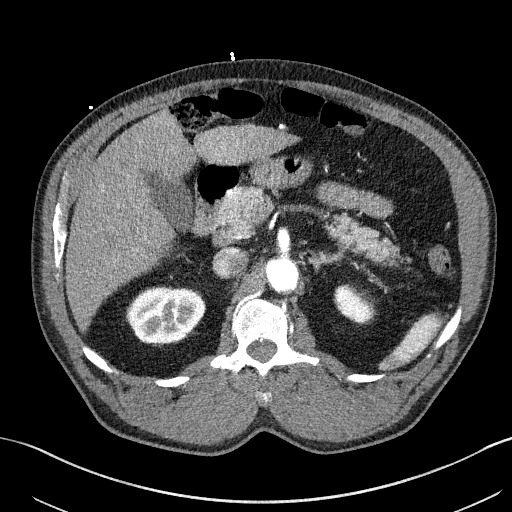
[im 133/222  soft-tissue]
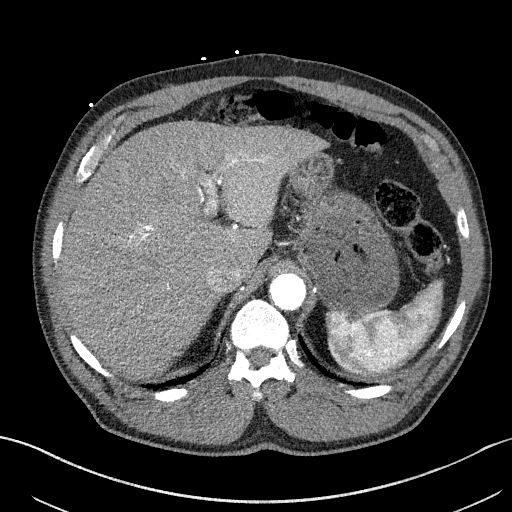
[im 163/222  soft-tissue]
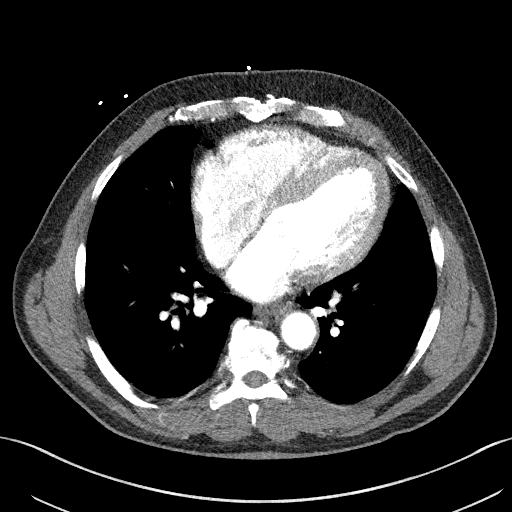
[im 177/222  soft-tissue]
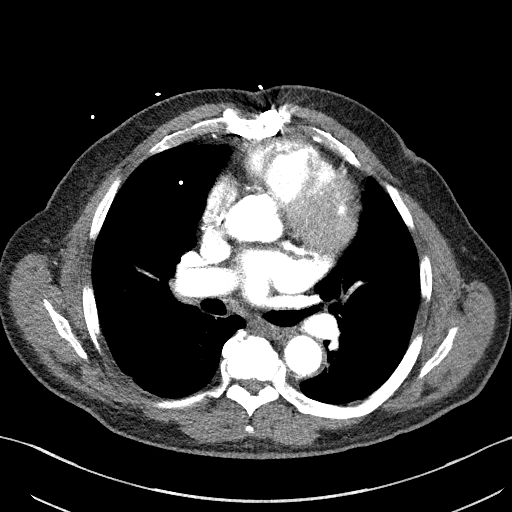
[im 207/222  soft-tissue]
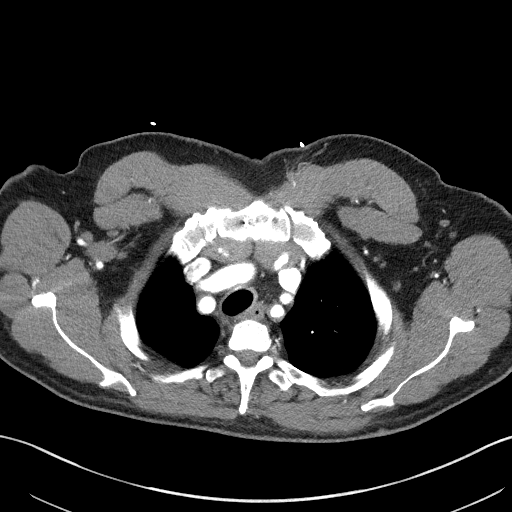
[im 207/222  bone]
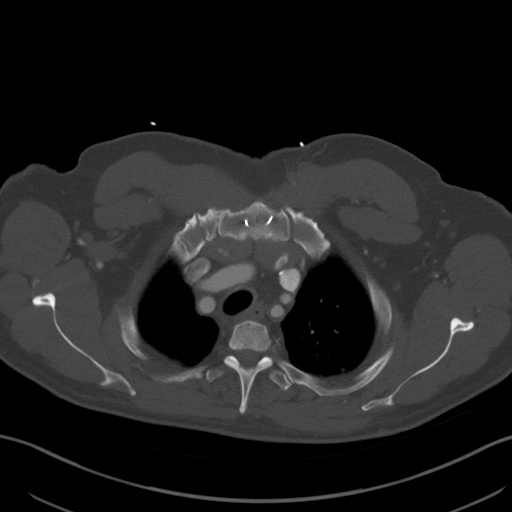

[Series 7: coronals · coronal · 0.75mm/px · 3 of 154 slices shown]
[im 39/154  soft-tissue]
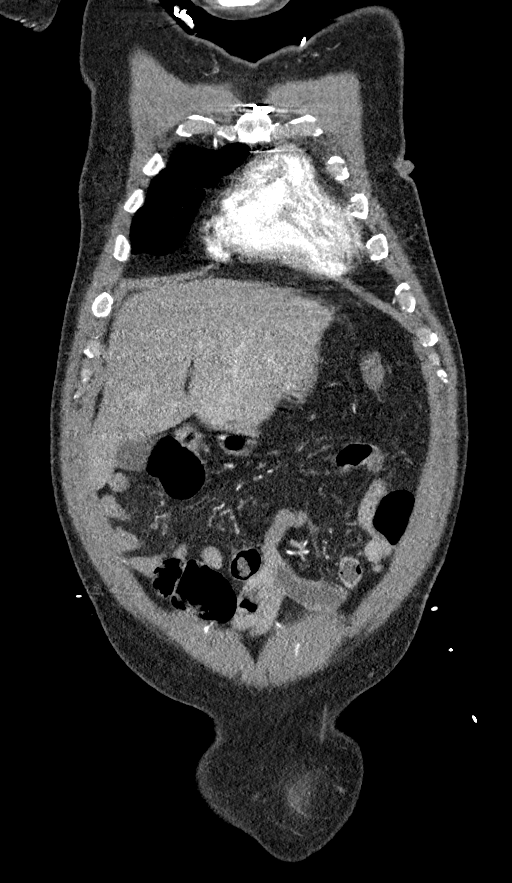
[im 77/154  soft-tissue]
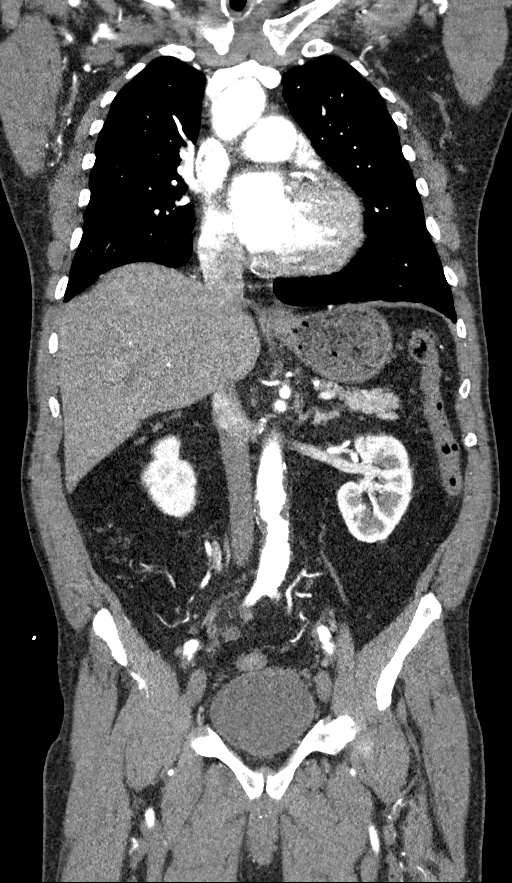
[im 115/154  soft-tissue]
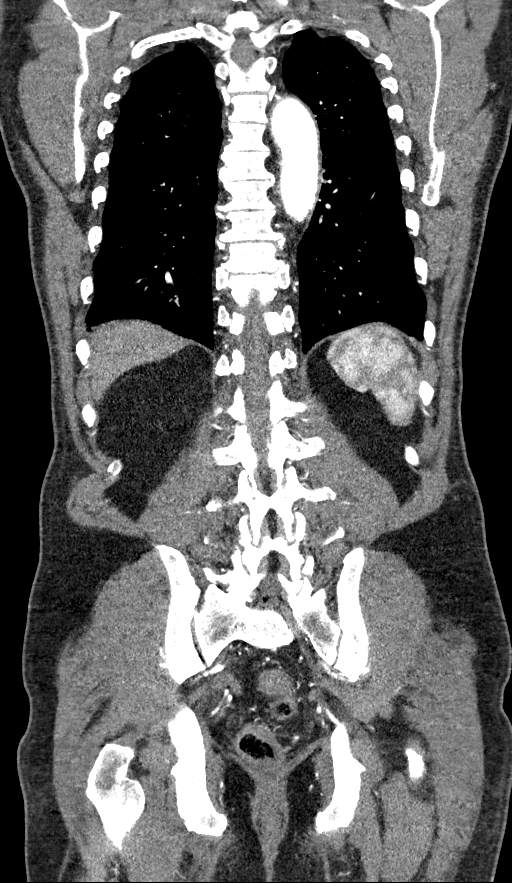

[12 of 46 positions shown; findings below may reference images not displayed]

FINDINGS: CTA CHEST FINDINGS

Cardiovascular:

Heart:

Cardiomegaly. No pericardial fluid/thickening. Dense calcifications
of native coronary arteries. Surgical changes of median sternotomy
and CABG.

Calcifications of the aortic arch valve.

Aorta:

The noncontrast chest CT demonstrates no hyperdense crescent within
the thoracic aorta. No aneurysm. No periaortic fluid or inflammatory
changes.

Postcontrast images demonstrate no dissection flap of the aorta.
Mild to moderate atherosclerotic changes of the aorta. Two vessel
arch with a common origin of the innominate air artery and the left
common carotid artery. Bilateral brachial artery patent. The
cervical arteries at the base of the neck are patent.

Pulmonary arteries:

No central, lobar, segmental, or proximal subsegmental filling
defects.

Mediastinum/Nodes: Small lymph nodes of the mediastinum.
Unremarkable appearance of the thoracic esophagus.

Unremarkable thoracic inlet

Lungs/Pleura: Minimal debris within the right mainstem bronchus and
left mainstem bronchus. No pleural effusion. No confluent airspace
disease.

No pneumothorax.

Calcified granuloma within the right upper lobe on image 69 of
series 6.

Mild centrilobular and paraseptal emphysema.

No bronchial wall thickening.

Review of the MIP images confirms the above findings.

CTA ABDOMEN AND PELVIS FINDINGS

VASCULAR

Aorta: Unremarkable course, caliber, contour of the abdominal aorta.
No dissection, aneurysm, or periaortic fluid. Mild to moderate
atherosclerotic changes of the abdominal aorta.

Celiac: Less than 50% narrowing at the origin of the celiac artery
with poststenotic dilation likely secondary to overlying
diaphragmatic [place]. Branches are patent.

SMA: SMA patent with mild atherosclerotic changes at the origin.

Renals: Mild atherosclerotic changes at the origin the bilateral
renal arteries. No high-grade stenosis on the right. On the left the
left main renal artery demonstrates at least 50% narrowing at the
origin. There is accessory renal artery to the lower pole.

IMA: IMA remains patent, likely stenotic at the origin.

Right lower extremity:

Unremarkable course, caliber, and contour of the right iliac system.
No aneurysm, dissection, or occlusion. Hypogastric artery is patent.
Anterior and posterior division patent. Common femoral artery
patent. Proximal SFA and profunda femoris patent. Moderate
atherosclerotic changes of the right iliac and proximal femoral
system.

Left lower extremity:

Unremarkable course, caliber, and contour of the left iliac system.
No aneurysm, dissection, or occlusion. Hypogastric artery is patent.
Anterior and posterior division patent. Common femoral artery
patent. Proximal SFA and profunda femoris patent. Moderate
atherosclerotic changes of the left iliac and proximal femoral
system.

Veins: Unremarkable appearance of the venous system.

Review of the MIP images confirms the above findings.

NON-VASCULAR

Hepatobiliary: Unremarkable appearance of the liver. Unremarkable
gall bladder.

Pancreas: Unremarkable pancreas

Spleen: Unremarkable

Adrenals/Urinary Tract: Unremarkable

Right:

No hydronephrosis. Symmetric perfusion to the left. No
nephrolithiasis. Unremarkable course of the right ureter.

Left:

No hydronephrosis. Symmetric perfusion to the right. No
nephrolithiasis. Unremarkable course of the left ureter.

The left dome of the urinary bladder is entrapped within left
inguinal hernia. No significant inflammatory changes.

Stomach/Bowel: Unremarkable appearance of the stomach. Unremarkable
appearance of small bowel. No evidence of obstruction. Thickened and
fluid-filled appendix with inflammatory changes at the margin. No
free air. No focal fluid. No fecalith. No comparison CT. Colonic
diverticula without evidence of acute diverticulitis.

Lymphatic: No lymphadenopathy.

Mesenteric: No free air or free fluid.  No mesenteric adenopathy

Reproductive: Prostate measures 5.5 cm

Other: Left inguinal hernia containing left dome of urinary bladder.

Musculoskeletal: Degenerative changes of the thoracolumbar spine.
Vacuum disc phenomenon at L3-L4, L4-L5, L5-S1. no bony canal
narrowing. Degenerative changes of the bilateral hips. No aggressive
lucent or sclerotic lesions. No displaced fracture identified.

Review of the MIP images confirms the above findings.
IMPRESSION: No acute arterial abnormality, with no evidence of acute aortic
syndrome.

Early appendicitis is suspected without complicating features.

These results were discussed by telephone at the time of
interpretation on 08/11/2018 at [DATE] with Dr. FENALE KANGIWA.

Aortic Atherosclerosis (RN12T-0VT.T). Associated native coronary
artery disease and surgical changes of CABG.

Emphysema (RN12T-CST.3).

No evidence of pneumonia, although there is minimal debris within
the right and left mainstem bronchi at the carina, potentially
secondary to poor cough response or aspiration.

The left dome of the urinary bladder is entrapped within a left
inguinal hernia.

Additional ancillary findings as above.

## 2021-03-30 ENCOUNTER — Other Ambulatory Visit: Payer: Self-pay

## 2021-03-30 ENCOUNTER — Ambulatory Visit (INDEPENDENT_AMBULATORY_CARE_PROVIDER_SITE_OTHER): Payer: Medicare HMO | Admitting: Internal Medicine

## 2021-03-30 ENCOUNTER — Encounter: Payer: Self-pay | Admitting: Internal Medicine

## 2021-03-30 VITALS — BP 131/75 | HR 72 | Ht 69.0 in | Wt 206.1 lb

## 2021-03-30 DIAGNOSIS — M1A3790 Chronic gout due to renal impairment, unspecified ankle and foot, without tophus (tophi): Secondary | ICD-10-CM | POA: Diagnosis not present

## 2021-03-30 DIAGNOSIS — I10 Essential (primary) hypertension: Secondary | ICD-10-CM

## 2021-03-30 DIAGNOSIS — I2510 Atherosclerotic heart disease of native coronary artery without angina pectoris: Secondary | ICD-10-CM | POA: Diagnosis not present

## 2021-03-30 DIAGNOSIS — N1831 Chronic kidney disease, stage 3a: Secondary | ICD-10-CM | POA: Diagnosis not present

## 2021-03-30 NOTE — Assessment & Plan Note (Signed)
Patient denies any chest pain or shortness.

## 2021-03-30 NOTE — Progress Notes (Signed)
Established Patient Office Visit  Subjective:  Patient ID: Benjamin Valentine, male    DOB: 12-19-1945  Age: 76 y.o. MRN: [id number]  CC:  Chief Complaint  Patient presents with   Hypertension    Hypertension   Benjamin Valentine presents for general checkup.  He has swelling of the legs also complaining about the pain in the right knee he has stopped taking NSAIDs, coronary artery disease is stable, patient was advised to follow low-salt low-cholesterol diet, Past Medical History:  Diagnosis Date   Arthritis    Chronic HFrEF (heart failure with reduced ejection fraction) ([place])    a. 10/2020 Echo: EF 25-30%, nl RV fxn, mild MR.   CKD (chronic kidney disease), stage III ([place])    Coronary artery disease    a. 2002 s/p CABG x 2: LIMA->LAD, VG->RPDA; b. 11/2020 Cath: LM 20ost, LAD 137m LCX 99p/m, 950m, OM1 30, RCA 100p - fills via collats from LAD. RPL1 fills via collats from LCX. VG->PDA 100, LIMA->LAD ok-->med rx.   Gout    Hyperlipidemia LDL goal <70    Hypertension    Ischemic cardiomyopathy    a. 10/2020 Echo: EF 25-30%.   Osteoarthritis     Past Surgical History:  Procedure Laterality Date   CARDIAC SURGERY     CABG 2002   LAPAROSCOPIC APPENDECTOMY N/A 08/11/2018   Procedure: APPENDECTOMY LAPAROSCOPIC;  Surgeon: PiOlean ReeMD;  Location: [facility];  Service: General;  Laterality: N/A;   RIGHT/LEFT HEART CATH AND CORONARY/GRAFT ANGIOGRAPHY N/A 11/15/2020   Procedure: RIGHT/LEFT HEART CATH AND CORONARY/GRAFT ANGIOGRAPHY;  Surgeon: ArWellington HampshireMD;  [facility];  Service: Cardiovascular;  Laterality: N/A;    History reviewed. No pertinent family history.  Social History   Socioeconomic History   Marital status: Widowed    Spouse name: Not on file   Number of children: Not on file   Years of education: Not on file   Highest education level: Not on file  Occupational History   Not on file  Tobacco Use   Smoking status: Former   Smokeless tobacco:  Never  Vaping Use   Vaping Use: Never used  Substance and Sexual Activity   Alcohol use: No   Drug use: No   Sexual activity: Not on file  Other Topics Concern   Not on file  Social History Narrative   Not on file   Social Determinants of Health   Financial Resource Strain: Low Risk    Difficulty of Paying Living Expenses: Not very hard  Food Insecurity: No Food Insecurity   Worried About Running Out of Food in the Last Year: Never true   Ran Out of Food in the Last Year: Never true  Transportation Needs: No Transportation Needs   Lack of Transportation (Medical): No   Lack of Transportation (Non-Medical): No  Physical Activity: Insufficiently Active   Days of Exercise per Week: 2 days   Minutes of Exercise per Session: 20 min  Stress: Stress Concern Present   Feeling of Stress : To some extent  Social Connections: Socially Isolated   Frequency of Communication with Friends and Family: More than three times a week   Frequency of Social Gatherings with Friends and Family: More than three times a week   Attends Religious Services: Never   AcMarine scientistr Organizations: No   Attends ClArchivisteetings: Never   Marital Status: Widowed  Intimate Partner Violence: Not At Risk   Fear  of Current or Ex-Partner: No   Emotionally Abused: No   Physically Abused: No   Sexually Abused: No     Current Outpatient Medications:    allopurinol (ZYLOPRIM) 100 MG tablet, Take 1 tablet (100 mg total) by mouth daily., Disp: 30 tablet, Rfl: 0   aspirin EC 81 MG EC tablet, Take 1 tablet (81 mg total) by mouth daily. Swallow whole., Disp: 30 tablet, Rfl: 11   atorvastatin (LIPITOR) 40 MG tablet, Take 1 tablet (40 mg total) by mouth daily., Disp: 30 tablet, Rfl: 6   carvedilol (COREG) 3.125 MG tablet, Take 1 tablet (3.125 mg total) by mouth 2 (two) times daily with a meal., Disp: 60 tablet, Rfl: 6   dapagliflozin propanediol (FARXIGA) 5 MG TABS tablet, Take 2 tablets (10 mg  total) by mouth daily., Disp: 30 tablet, Rfl: 5   furosemide (LASIX) 20 MG tablet, Take 1 tablet (20 mg total) by mouth daily as needed (As needed for weight gain of 2 pounds overnight)., Disp: 90 tablet, Rfl: 3   hydrALAZINE (APRESOLINE) 50 MG tablet, Take 1 tablet (50 mg total) by mouth 3 (three) times daily., Disp: 90 tablet, Rfl: 6   isosorbide mononitrate (IMDUR) 30 MG 24 hr tablet, Take 1 tablet (30 mg total) by mouth daily., Disp: 30 tablet, Rfl: 5   lidocaine (LIDODERM) 5 %, Place 1 patch onto the skin as needed., Disp: , Rfl:    naproxen sodium (ANAPROX) 550 MG tablet, Take 550 mg by mouth 2 (two) times daily with a meal., Disp: , Rfl:    spironolactone (ALDACTONE) 25 MG tablet, Take 25 mg by mouth daily., Disp: , Rfl:    No Known Allergies  ROS Review of Systems  Constitutional: Negative.   HENT: Negative.    Eyes: Negative.   Respiratory: Negative.    Cardiovascular: Negative.   Gastrointestinal: Negative.   Endocrine: Negative.   Genitourinary: Negative.   Musculoskeletal: Negative.   Skin: Negative.   Allergic/Immunologic: Negative.   Neurological: Negative.   Hematological: Negative.   Psychiatric/Behavioral: Negative.    All other systems reviewed and are negative.    Objective:    Physical Exam Vitals reviewed.  Constitutional:      Appearance: Normal appearance.  HENT:     Mouth/Throat:     Mouth: Mucous membranes are moist.  Eyes:     Pupils: Pupils are equal, round, and reactive to light.  Neck:     Vascular: No carotid bruit.  Cardiovascular:     Rate and Rhythm: Normal rate and regular rhythm.     Pulses: Normal pulses.     Heart sounds: Normal heart sounds.  Pulmonary:     Effort: Pulmonary effort is normal.     Breath sounds: Normal breath sounds.  Abdominal:     General: Bowel sounds are normal.     Palpations: Abdomen is soft. There is no hepatomegaly, splenomegaly or mass.     Tenderness: There is no abdominal tenderness.     Hernia: No  hernia is present.  Musculoskeletal:     Cervical back: Neck supple.     Right lower leg: No edema.     Left lower leg: No edema.  Skin:    Findings: No rash.  Neurological:     Mental Status: He is alert and oriented to person, place, and time.     Motor: No weakness.  Psychiatric:        Mood and Affect: Mood normal.  Behavior: Behavior normal.    BP 131/75    Pulse 72    Ht 5' 9"  (1.753 m)    Wt 206 lb 1.6 oz (93.5 kg)    BMI 30.44 kg/m  Wt Readings from Last 3 Encounters:  03/30/21 206 lb 1.6 oz (93.5 kg)  03/22/21 203 lb 9 oz (92.3 kg)  02/16/21 208 lb (94.3 kg)     Health Maintenance Due  Topic Date Due   COVID-19 Vaccine (1) Never done   Pneumonia Vaccine 32+ Years old (1 - PCV) Never done   Hepatitis C Screening  Never done   TETANUS/TDAP  Never done   Zoster Vaccines- Shingrix (1 of 2) Never done   INFLUENZA VACCINE  Never done    There are no preventive care reminders to display for this patient.  No results found for: TSH Lab Results  Component Value Date   WBC 9.1 11/25/2020   HGB 14.3 11/25/2020   HCT 42.1 11/25/2020   MCV 90.3 11/25/2020   PLT 398 11/25/2020   Lab Results  Component Value Date   NA 140 12/31/2020   K 4.9 12/31/2020   CO2 21 12/31/2020   GLUCOSE 92 12/31/2020   BUN 20 12/31/2020   CREATININE 1.33 (H) 12/31/2020   BILITOT 0.5 11/25/2020   ALKPHOS 60 11/25/2020   AST 23 11/25/2020   ALT 16 11/25/2020   PROT 8.6 (H) 11/25/2020   ALBUMIN 3.9 11/25/2020   CALCIUM 9.8 12/31/2020   ANIONGAP 9 11/26/2020   EGFR 55 (L) 12/31/2020   Lab Results  Component Value Date   CHOL 110 11/12/2020   Lab Results  Component Value Date   HDL 22 (L) 11/12/2020   Lab Results  Component Value Date   LDLCALC 70 11/12/2020   Lab Results  Component Value Date   TRIG 91 11/12/2020   Lab Results  Component Value Date   CHOLHDL 5.0 11/12/2020   Lab Results  Component Value Date   HGBA1C 6.0 (H) 11/25/2020      Assessment &  Plan:   Problem List Items Addressed This Visit       Cardiovascular and Mediastinum   Essential hypertension - Primary     Patient denies any chest pain or shortness of breath there is no history of palpitation or paroxysmal nocturnal dyspnea   patient was advised to follow low-salt low-cholesterol diet    ideally I want to keep systolic blood pressure below 130 mmHg, patient was asked to check blood pressure one times a week and give me a report on that.  Patient will be follow-up in 3 months  or earlier as needed, patient will call me back for any change in the cardiovascular symptoms Patient was advised to buy a book from local bookstore concerning blood pressure and read several chapters  every day.  This will be supplemented by some of the material we will give him from the office.  Patient should also utilize other resources like YouTube and Internet to learn more about the blood pressure and the diet.      Coronary artery disease involving native coronary artery of native heart without angina pectoris    Patient denies any chest pain or shortness.        Musculoskeletal and Integument   Chronic gout due to renal impairment involving foot without tophus    Stable at the present time        Genitourinary   Stage 3a chronic kidney disease ([place])  Patient was advised to stop NSAIDs     Hypercholesterolemia  I advised the patient to follow Mediterranean diet This diet is rich in fruits vegetables and whole grain, and This diet is also rich in fish and lean meat Patient should also eat a handful of almonds or walnuts daily Recent heart study indicated that average follow-up on this kind of diet reduces the cardiovascular mortality by 50 to 70%==  No orders of the defined types were placed in this encounter.   Follow-up: No follow-ups on file.    Cletis Athens, MD

## 2021-03-30 NOTE — Assessment & Plan Note (Signed)

## 2021-03-30 NOTE — Assessment & Plan Note (Signed)
Stable at the present time. 

## 2021-03-30 NOTE — Assessment & Plan Note (Signed)
Patient was advised to stop NSAIDs

## 2021-04-25 NOTE — Progress Notes (Deleted)
Patient ID: Benjamin Valentine, male    DOB: 06-07-1945, 76 y.o.   MRN: [id number]   Benjamin Valentine is a 76 y/o male with a history of CAD (CABG), HTN, arthritis, gout, previous tobacco use and chronic heart failure.   Echo report from 11/11/20 reviewed and showed an EF of 25-30% along with mild Benjamin.   RHC/LHC done 11/15/20 and showed: Ost LM lesion is 20% stenosed.   Prox Cx to Mid Cx lesion is 99% stenosed.   Mid Cx to Dist Cx lesion is 99% stenosed.   1st Mrg lesion is 30% stenosed.   Mid LAD lesion is 100% stenosed.   Prox RCA lesion is 100% stenosed.   Origin lesion is 100% stenosed.   SVG.   LIMA graft was visualized by angiography and is normal in caliber.   The graft exhibits no disease. 1.  Significant underlying three-vessel coronary artery disease with occluded mid LAD, occluded proximal right coronary artery and subtotal occlusion of the mid left circumflex after the origin of a large high OM1 which supplies the majority of the left circumflex distribution. Patent LIMA to LAD with occluded SVG to RCA and SVG to OM. The RCA gets left-to-right collaterals. 2.  Left ventricular angiography was not performed due to chronic kidney disease. 3.  Right heart catheterization showed high normal filling pressures, moderate pulmonary hypertension and moderately to severely reduced cardiac output.  Admitted 11/25/20 due to left arm/leg weakness. Neurology consult obtained. Brain MRI negative. CT angio of head/neck was without occlusion. Discharged the following day. Admitted 11/11/20 due to dry cough, shortness of breath and abdominal bloating. Given duonebs and IV lasix with subsequent stoppage of diuretic due to renal disease. Left inguinal hernia noted. Cardiology and surgical consults obtained. Cath completed. Discharged after 6 days.   He presents today for a follow-up  visit with a chief complaint of mild fatigue with moderate exertion. He says he is getting stronger and needing to rest less, he  overall feels "good".  He has no other symptoms and specifically denies any difficulty sleeping, dizziness, abdominal distention, palpitations, pedal edema, chest pain, shortness of breath, cough.   Ran out of his isosorbide x 4 days. Weighs daily, and reports no fluctuations of his weight.   Past Medical History:  Diagnosis Date   Arthritis    Chronic HFrEF (heart failure with reduced ejection fraction) ([place])    a. 10/2020 Echo: EF 25-30%, nl RV fxn, mild Benjamin.   CKD (chronic kidney disease), stage III ([place])    Coronary artery disease    a. 2002 s/p CABG x 2: LIMA->LAD, VG->RPDA; b. 11/2020 Cath: LM 20ost, LAD 128m, LCX 99p/m, 34m/d, OM1 30, RCA 100p - fills via collats from LAD. RPL1 fills via collats from LCX. VG->PDA 100, LIMA->LAD ok-->med rx.   Gout    Hyperlipidemia LDL goal <70    Hypertension    Ischemic cardiomyopathy    a. 10/2020 Echo: EF 25-30%.   Osteoarthritis    Past Surgical History:  Procedure Laterality Date   CARDIAC SURGERY     CABG 2002   LAPAROSCOPIC APPENDECTOMY N/A 08/11/2018   Procedure: APPENDECTOMY LAPAROSCOPIC;  Surgeon: Olean Ree, MD;  Location: [facility];  Service: General;  Laterality: N/A;   RIGHT/LEFT HEART CATH AND CORONARY/GRAFT ANGIOGRAPHY N/A 11/15/2020   Procedure: RIGHT/LEFT HEART CATH AND CORONARY/GRAFT ANGIOGRAPHY;  Surgeon: Wellington Hampshire, MD;  Location: [facility];  Service: Cardiovascular;  Laterality: N/A;   No family history on file.  Social History   Tobacco Use   Smoking status: Former   Smokeless tobacco: Never  Substance Use Topics   Alcohol use: No   No Known Allergies  Prior to Admission medications   Medication Sig Start Date End Date Taking? Authorizing Provider  allopurinol (ZYLOPRIM) 100 MG tablet Take 0.5 tablets (50 mg total) by mouth daily. Patient taking differently: Take 100 mg by mouth daily. 11/27/20  Yes Dwyane Dee, MD  aspirin EC 81 MG EC tablet Take 1 tablet (81 mg total) by mouth daily. Swallow  whole. 11/27/20  Yes Dwyane Dee, MD  atorvastatin (LIPITOR) 40 MG tablet Take 1 tablet (40 mg total) by mouth daily. 11/24/20  Yes [name], Viann Shove, MD  carvedilol (COREG) 3.125 MG tablet Take 1 tablet (3.125 mg total) by mouth 2 (two) times daily with a meal. 11/24/20  Yes [name], Viann Shove, MD  dapagliflozin propanediol (FARXIGA) 5 MG TABS tablet Take 1 tablet (5 mg total) by mouth daily. 12/31/20  Yes Theora Gianotti, NP  furosemide (LASIX) 20 MG tablet Take 1 tablet (20 mg total) by mouth daily as needed (As needed for weight gain of 2 pounds overnight). 12/31/20 03/31/21 Yes Theora Gianotti, NP  hydrALAZINE (APRESOLINE) 50 MG tablet Take 1 tablet (50 mg total) by mouth 3 (three) times daily. 11/24/20  Yes [name], Viann Shove, MD  isosorbide mononitrate (IMDUR) 30 MG 24 hr tablet Take 1 tablet (30 mg total) by mouth daily. 12/31/20 03/31/21 Yes Theora Gianotti, NP   Review of Systems  Constitutional:  Positive for fatigue. Negative for appetite change and diaphoresis.  HENT:  Negative for congestion, postnasal drip and sore throat.   Eyes: Negative.   Respiratory:  Negative for cough, chest tightness and shortness of breath.   Cardiovascular:  Negative for chest pain, palpitations and leg swelling.  Gastrointestinal:  Negative for abdominal distention and abdominal pain.  Endocrine: Negative.   Musculoskeletal:  Positive for arthralgias (neck pain). Negative for neck pain.  Skin: Negative.   Allergic/Immunologic: Negative.   Neurological:  Negative for dizziness and light-headedness.  Hematological:  Negative for adenopathy. Does not bruise/bleed easily.  Psychiatric/Behavioral:  Negative for dysphoric mood and sleep disturbance (sleeping on 1 pillows). The patient is not nervous/anxious.    There were no vitals filed for this visit.  Wt Readings from Last 3 Encounters:  03/30/21 206 lb 1.6 oz (93.5 kg)  03/22/21 203 lb 9 oz (92.3 kg)  02/16/21 208 lb (94.3 kg)    Lab Results  Component Value Date   CREATININE 1.33 (H) 12/31/2020   CREATININE 1.57 (H) 11/26/2020   CREATININE 1.86 (H) 11/25/2020   Physical Exam Vitals and nursing note reviewed. Exam conducted with a chaperone present ("friend who is like a daughter").  Constitutional:      General: He is not in acute distress.    Appearance: Normal appearance.  HENT:     Head: Normocephalic and atraumatic.  Cardiovascular:     Rate and Rhythm: Normal rate and regular rhythm.  Pulmonary:     Effort: Pulmonary effort is normal. No respiratory distress.     Breath sounds: No wheezing or rales.  Abdominal:     General: Abdomen is flat. There is no distension.     Palpations: Abdomen is soft.  Musculoskeletal:        General: No tenderness.     Cervical back: Normal range of motion and neck supple. No rigidity.     Right lower leg: No edema.  Left lower leg: No edema.  Skin:    General: Skin is warm and dry.  Neurological:     General: No focal deficit present.     Mental Status: He is alert and oriented to person, place, and time.  Psychiatric:        Mood and Affect: Mood normal.        Behavior: Behavior normal.        Thought Content: Thought content normal.    Assessment & Plan:  1: Chronic heart failure with reduced ejection fraction- - NYHA class II - euvolemic today - weighing daily; reminded to call for an overnight weight gain of > 2 pounds or a weekly weight gain of > 5 pounds - weight 203 today, unchanged from last visit - not adding salt and friend ("daughter") does most of the cooking and doesn't cook with salt either - on GDMT of carvedilol and farxiga  - will increase farxiga dose to 10 mg (was on 5 mg) - checks labs next visit - anticipate starting MRA next appointment depending on labs - BNP 11/11/20 was 616.5  2: HTN with CKD- - BP152/81, out of isosorbide x 4 days - refill send for isosorbide - patient has had difficulty getting in touch with PCP, called  and made f/u appointment for him for next week (2/15) - BMP 12/31/20 reviewed and showed sodium 140, potassium 4.9, creatinine 1.33 and GFR 55  3: CAD- - CABG back in 2002 - saw cardiology Sharolyn Douglas) 12/31/20 & isosorbide 30mg  started  4: Inguinal hernia- - patient defers surgery at this time   Medication bottles reviewed.   Return in 1 months or sooner for any questions/problems before then.

## 2021-04-26 ENCOUNTER — Telehealth: Payer: Self-pay

## 2021-04-26 ENCOUNTER — Ambulatory Visit: Payer: Medicare HMO | Admitting: Family

## 2021-04-26 NOTE — Telephone Encounter (Addendum)
Pt's daughter called to reschedule patient's appointment and she stated that patient hasn't ever started 10 MG farxiga prescribed on last visit. She states they received a letter from insurance company denying the medication but was unable to recall the specific details. [facility] notified. Asked the patient to please bring the letter from their insurance company with them to follow up appt next week and we will call them back if Darylene Price, [name] wants to make any medication changes before next appt.  ?Patient's daughter verbalized understanding.  ?Georg Ruddle, RN ? ?9:15 AM- Called patient's pharmacy and left a voicemail to inquire about the insurance issue with farxiga. Awaiting call back.  ?

## 2021-04-27 ENCOUNTER — Other Ambulatory Visit: Payer: Self-pay

## 2021-04-27 ENCOUNTER — Ambulatory Visit (INDEPENDENT_AMBULATORY_CARE_PROVIDER_SITE_OTHER): Payer: Medicare HMO | Admitting: Nurse Practitioner

## 2021-04-27 ENCOUNTER — Encounter: Payer: Self-pay | Admitting: Nurse Practitioner

## 2021-04-27 VITALS — BP 156/73 | HR 72 | Ht 69.0 in | Wt 211.9 lb

## 2021-04-27 DIAGNOSIS — Z6831 Body mass index (BMI) 31.0-31.9, adult: Secondary | ICD-10-CM | POA: Diagnosis not present

## 2021-04-27 DIAGNOSIS — I2510 Atherosclerotic heart disease of native coronary artery without angina pectoris: Secondary | ICD-10-CM | POA: Diagnosis not present

## 2021-04-27 DIAGNOSIS — M1A3790 Chronic gout due to renal impairment, unspecified ankle and foot, without tophus (tophi): Secondary | ICD-10-CM

## 2021-04-27 DIAGNOSIS — E785 Hyperlipidemia, unspecified: Secondary | ICD-10-CM

## 2021-04-27 DIAGNOSIS — I10 Essential (primary) hypertension: Secondary | ICD-10-CM | POA: Diagnosis not present

## 2021-04-27 DIAGNOSIS — E669 Obesity, unspecified: Secondary | ICD-10-CM

## 2021-04-27 NOTE — Progress Notes (Signed)
Established Patient Office Visit  Subjective:  Patient ID: Benjamin Valentine, male    DOB: Aug 26, 1945  Age: 76 y.o. MRN: [id number]  CC:  Chief Complaint  Patient presents with   Follow-up     HPI  Benjamin Valentine presents for routine follow up. He has no new concerns at present.   HPI   Past Medical History:  Diagnosis Date   Arthritis    Chronic HFrEF (heart failure with reduced ejection fraction) ([facility])    a. 10/2020 Echo: EF 25-30%, nl RV fxn, mild MR.   CKD (chronic kidney disease), stage III ([facility])    Coronary artery disease    a. 2002 s/p CABG x 2: LIMA->LAD, VG->RPDA; b. 11/2020 Cath: LM 20ost, LAD 173m, LCX 99p/m, 74m/d, OM1 30, RCA 100p - fills via collats from LAD. RPL1 fills via collats from LCX. VG->PDA 100, LIMA->LAD ok-->med rx.   Gout    Hyperlipidemia LDL goal <70    Hypertension    Ischemic cardiomyopathy    a. 10/2020 Echo: EF 25-30%.   Osteoarthritis     Past Surgical History:  Procedure Laterality Date   CARDIAC SURGERY     CABG 2002   LAPAROSCOPIC APPENDECTOMY N/A 08/11/2018   Procedure: APPENDECTOMY LAPAROSCOPIC;  Surgeon: Henrene Dodge, MD;  Location: [facility];  Service: General;  Laterality: N/A;   RIGHT/LEFT HEART CATH AND CORONARY/GRAFT ANGIOGRAPHY N/A 11/15/2020   Procedure: RIGHT/LEFT HEART CATH AND CORONARY/GRAFT ANGIOGRAPHY;  Surgeon: Iran Ouch, MD;  Location: [facility];  Service: Cardiovascular;  Laterality: N/A;    No family history on file.  Social History   Socioeconomic History   Marital status: Widowed    Spouse name: Not on file   Number of children: Not on file   Years of education: Not on file   Highest education level: Not on file  Occupational History   Not on file  Tobacco Use   Smoking status: Former   Smokeless tobacco: Never  Vaping Use   Vaping Use: Never used  Substance and Sexual Activity   Alcohol use: No   Drug use: No   Sexual activity: Not on file  Other Topics Concern   Not on file   Social History Narrative   Not on file   Social Determinants of Health   Financial Resource Strain: Low Risk    Difficulty of Paying Living Expenses: Not very hard  Food Insecurity: No Food Insecurity   Worried About Running Out of Food in the Last Year: Never true   Ran Out of Food in the Last Year: Never true  Transportation Needs: No Transportation Needs   Lack of Transportation (Medical): No   Lack of Transportation (Non-Medical): No  Physical Activity: Insufficiently Active   Days of Exercise per Week: 2 days   Minutes of Exercise per Session: 20 min  Stress: Stress Concern Present   Feeling of Stress : To some extent  Social Connections: Socially Isolated   Frequency of Communication with Friends and Family: More than three times a week   Frequency of Social Gatherings with Friends and Family: More than three times a week   Attends Religious Services: Never   Database administrator or Organizations: No   Attends Banker Meetings: Never   Marital Status: Widowed  Catering manager Violence: Not At Risk   Fear of Current or Ex-Partner: No   Emotionally Abused: No   Physically Abused: No   Sexually Abused: No  Outpatient Medications Prior to Visit  Medication Sig Dispense Refill   allopurinol (ZYLOPRIM) 100 MG tablet Take 1 tablet (100 mg total) by mouth daily. 30 tablet 0   aspirin EC 81 MG EC tablet Take 1 tablet (81 mg total) by mouth daily. Swallow whole. 30 tablet 11   atorvastatin (LIPITOR) 40 MG tablet Take 1 tablet (40 mg total) by mouth daily. 30 tablet 6   carvedilol (COREG) 3.125 MG tablet Take 1 tablet (3.125 mg total) by mouth 2 (two) times daily with a meal. 60 tablet 6   hydrALAZINE (APRESOLINE) 50 MG tablet Take 1 tablet (50 mg total) by mouth 3 (three) times daily. 90 tablet 6   isosorbide mononitrate (IMDUR) 30 MG 24 hr tablet Take 1 tablet (30 mg total) by mouth daily. 30 tablet 5   dapagliflozin propanediol (FARXIGA) 5 MG TABS tablet  Take 2 tablets (10 mg total) by mouth daily. (Patient not taking: Reported on 04/27/2021) 30 tablet 5   furosemide (LASIX) 20 MG tablet Take 1 tablet (20 mg total) by mouth daily as needed (As needed for weight gain of 2 pounds overnight). 90 tablet 3   lidocaine (LIDODERM) 5 % Place 1 patch onto the skin as needed.     naproxen sodium (ANAPROX) 550 MG tablet Take 550 mg by mouth 2 (two) times daily with a meal. (Patient not taking: Reported on 04/27/2021)     spironolactone (ALDACTONE) 25 MG tablet Take 25 mg by mouth daily. (Patient not taking: Reported on 04/27/2021)     No facility-administered medications prior to visit.    No Known Allergies  ROS Review of Systems  Constitutional: Negative.   HENT: Negative.    Eyes: Negative.   Respiratory:  Negative for shortness of breath and wheezing.   Cardiovascular:  Negative for chest pain and palpitations.  Gastrointestinal: Negative.   Endocrine: Negative.   Genitourinary: Negative.   Musculoskeletal: Negative.   Skin: Negative.   Neurological:  Negative for speech difficulty, light-headedness and headaches.  Hematological: Negative.   Psychiatric/Behavioral:  Negative for agitation, behavioral problems and confusion.      Objective:    Physical Exam Constitutional:      Appearance: Normal appearance. He is obese.  HENT:     Head: Normocephalic.     Right Ear: Tympanic membrane normal.     Left Ear: Tympanic membrane normal.     Nose: Nose normal.     Mouth/Throat:     Mouth: Mucous membranes are moist.     Pharynx: Oropharynx is clear.  Eyes:     Extraocular Movements: Extraocular movements intact.     Conjunctiva/sclera: Conjunctivae normal.     Pupils: Pupils are equal, round, and reactive to light.  Cardiovascular:     Rate and Rhythm: Normal rate and regular rhythm.     Pulses: Normal pulses.     Heart sounds: Normal heart sounds.  Pulmonary:     Effort: Pulmonary effort is normal.     Breath sounds: Normal breath  sounds.  Abdominal:     General: Bowel sounds are normal.     Palpations: Abdomen is soft.  Musculoskeletal:        General: Normal range of motion.     Cervical back: Normal range of motion.  Skin:    General: Skin is warm.     Capillary Refill: Capillary refill takes less than 2 seconds.  Neurological:     General: No focal deficit present.     Mental Status:  He is alert and oriented to person, place, and time. Mental status is at baseline.  Psychiatric:        Mood and Affect: Mood normal.        Behavior: Behavior normal.        Thought Content: Thought content normal.        Judgment: Judgment normal.    BP (!) 156/73   Pulse 72   Ht 5\' 9"  (1.753 m)   Wt 211 lb 14.4 oz (96.1 kg)   BMI 31.29 kg/m  Wt Readings from Last 3 Encounters:  04/27/21 211 lb 14.4 oz (96.1 kg)  03/30/21 206 lb 1.6 oz (93.5 kg)  03/22/21 203 lb 9 oz (92.3 kg)     Health Maintenance Due  Topic Date Due   COVID-19 Vaccine (1) Never done   Pneumonia Vaccine 57+ Years old (1 - PCV) Never done   Hepatitis C Screening  Never done   TETANUS/TDAP  Never done   Zoster Vaccines- Shingrix (1 of 2) Never done   INFLUENZA VACCINE  Never done    There are no preventive care reminders to display for this patient.  No results found for: TSH Lab Results  Component Value Date   WBC 9.1 11/25/2020   HGB 14.3 11/25/2020   HCT 42.1 11/25/2020   MCV 90.3 11/25/2020   PLT 398 11/25/2020   Lab Results  Component Value Date   NA 140 12/31/2020   K 4.9 12/31/2020   CO2 21 12/31/2020   GLUCOSE 92 12/31/2020   BUN 20 12/31/2020   CREATININE 1.33 (H) 12/31/2020   BILITOT 0.5 11/25/2020   ALKPHOS 60 11/25/2020   AST 23 11/25/2020   ALT 16 11/25/2020   PROT 8.6 (H) 11/25/2020   ALBUMIN 3.9 11/25/2020   CALCIUM 9.8 12/31/2020   ANIONGAP 9 11/26/2020   EGFR 55 (L) 12/31/2020   Lab Results  Component Value Date   CHOL 110 11/12/2020   Lab Results  Component Value Date   HDL 22 (L) 11/12/2020    Lab Results  Component Value Date   LDLCALC 70 11/12/2020   Lab Results  Component Value Date   TRIG 91 11/12/2020   Lab Results  Component Value Date   CHOLHDL 5.0 11/12/2020   Lab Results  Component Value Date   HGBA1C 6.0 (H) 11/25/2020      Assessment & Plan:   Problem List Items Addressed This Visit       Cardiovascular and Mediastinum   Essential hypertension - Primary    Pt blood pressure 156/73 Discussed the pt medication regimen and verified that they are taking the medication appropriately.  Advise pt to limit the intake of sodium. Advise pt to increase the intake of water and perform regular exercise  Advise pt to measure the BP at home and bring it to the next appointment.             Coronary artery disease involving native coronary artery of native heart without angina pectoris    Stable at present.        Other   Class 1 obesity without serious comorbidity with body mass index (BMI) of 31.0 to 31.9 in adult    BMI 31.29 Advised pt to lose weight. Advised patient to watch diet  Went over the risk of chronic diseases with increased weight.           Hyperlipidemia    Advised patient to avoid trans fat, fatty and fried food. Follow  a regular physical activity schedule.            No orders of the defined types were placed in this encounter.    Follow-up: No follow-ups on file.    Kara Dies, NP

## 2021-04-27 NOTE — Assessment & Plan Note (Signed)
Pt blood pressure 156/73 ?Discussed the pt medication regimen and verified that they are taking the medication appropriately.  ?Advise pt to limit the intake of sodium. ?Advise pt to increase the intake of water and perform regular exercise  ?Advise pt to measure the BP at home and bring it to the next appointment.  ?  ? ?  ? ?

## 2021-05-01 ENCOUNTER — Encounter: Payer: Self-pay | Admitting: Nurse Practitioner

## 2021-05-01 ENCOUNTER — Other Ambulatory Visit: Payer: Self-pay | Admitting: Internal Medicine

## 2021-05-01 DIAGNOSIS — E785 Hyperlipidemia, unspecified: Secondary | ICD-10-CM | POA: Insufficient documentation

## 2021-05-01 MED ORDER — SPHYGMOMANOMETER MISC: 1.0000 | Freq: Every day | 0 refills | Status: DC

## 2021-05-01 NOTE — Assessment & Plan Note (Signed)
Advised patient to avoid trans fat, fatty and fried food. ?Follow a regular physical activity schedule. ? ? ? ? ?

## 2021-05-01 NOTE — Assessment & Plan Note (Signed)
BMI 31.29 ?Advised pt to lose weight. ?Advised patient to watch diet  ?Went over the risk of chronic diseases with increased weight.   ? ? ? ?

## 2021-05-01 NOTE — Assessment & Plan Note (Signed)
Stable at present.   

## 2021-05-05 ENCOUNTER — Other Ambulatory Visit
Admission: RE | Admit: 2021-05-05 | Discharge: 2021-05-05 | Disposition: A | Discharge status: Home / Self Care | Payer: Medicare HMO | Source: Ambulatory Visit | Attending: Family | Admitting: Family

## 2021-05-05 ENCOUNTER — Other Ambulatory Visit: Payer: Self-pay

## 2021-05-05 ENCOUNTER — Encounter: Payer: Self-pay | Admitting: Family

## 2021-05-05 ENCOUNTER — Ambulatory Visit (HOSPITAL_BASED_OUTPATIENT_CLINIC_OR_DEPARTMENT_OTHER): Payer: Medicare HMO | Admitting: Family

## 2021-05-05 VITALS — BP 116/61 | HR 76 | Resp 18 | Ht 69.0 in | Wt 207.0 lb

## 2021-05-05 DIAGNOSIS — Z7984 Long term (current) use of oral hypoglycemic drugs: Secondary | ICD-10-CM | POA: Diagnosis not present

## 2021-05-05 DIAGNOSIS — M1990 Unspecified osteoarthritis, unspecified site: Secondary | ICD-10-CM | POA: Insufficient documentation

## 2021-05-05 DIAGNOSIS — N183 Chronic kidney disease, stage 3 unspecified: Secondary | ICD-10-CM | POA: Diagnosis not present

## 2021-05-05 DIAGNOSIS — I5022 Chronic systolic (congestive) heart failure: Secondary | ICD-10-CM | POA: Insufficient documentation

## 2021-05-05 DIAGNOSIS — I10 Essential (primary) hypertension: Secondary | ICD-10-CM

## 2021-05-05 DIAGNOSIS — I2510 Atherosclerotic heart disease of native coronary artery without angina pectoris: Secondary | ICD-10-CM | POA: Insufficient documentation

## 2021-05-05 DIAGNOSIS — Z87891 Personal history of nicotine dependence: Secondary | ICD-10-CM | POA: Diagnosis not present

## 2021-05-05 DIAGNOSIS — M109 Gout, unspecified: Secondary | ICD-10-CM | POA: Diagnosis not present

## 2021-05-05 DIAGNOSIS — Z79899 Other long term (current) drug therapy: Secondary | ICD-10-CM | POA: Insufficient documentation

## 2021-05-05 DIAGNOSIS — I110 Hypertensive heart disease with heart failure: Secondary | ICD-10-CM | POA: Diagnosis present

## 2021-05-05 DIAGNOSIS — I340 Nonrheumatic mitral (valve) insufficiency: Secondary | ICD-10-CM | POA: Diagnosis not present

## 2021-05-05 DIAGNOSIS — Z951 Presence of aortocoronary bypass graft: Secondary | ICD-10-CM | POA: Diagnosis not present

## 2021-05-05 DIAGNOSIS — I130 Hypertensive heart and chronic kidney disease with heart failure and stage 1 through stage 4 chronic kidney disease, or unspecified chronic kidney disease: Secondary | ICD-10-CM | POA: Diagnosis not present

## 2021-05-05 LAB — BASIC METABOLIC PANEL
Anion gap: 8 (ref 5–15)
BUN: 22 mg/dL (ref 8–23)
CO2: 27 mmol/L (ref 22–32)
Calcium: 9.2 mg/dL (ref 8.9–10.3)
Chloride: 105 mmol/L (ref 98–111)
Creatinine, Ser: 1.35 mg/dL — ABNORMAL HIGH (ref 0.61–1.24)
GFR, Estimated: 54 mL/min — ABNORMAL LOW (ref >60)
Glucose, Bld: 78 mg/dL (ref 70–99)
Potassium: 3.8 mmol/L (ref 3.5–5.1)
Sodium: 140 mmol/L (ref 135–145)

## 2021-05-05 NOTE — Patient Instructions (Addendum)
Continue weighing daily and call for an overnight weight gain of 3 pounds or more or a weekly weight gain of more than 5 pounds.   If you have voicemail, please make sure your mailbox is cleaned out so that we may leave a message and please make sure to listen to any voicemails.     

## 2021-05-05 NOTE — Progress Notes (Signed)
? Patient ID: Benjamin Valentine, male    DOB: [birth year], 76 y.o.   MRN: [id number] ? ? ?Benjamin Valentine is Valentine 76 y/o male with Valentine history of CAD (CABG), HTN, arthritis, gout, previous tobacco use and chronic heart failure.  ? ?Echo report from 11/11/20 reviewed and showed an EF of 25-30% along with mild Benjamin.  ? ?RHC/LHC done 11/15/20 and showed: ?Ost LM lesion is 20% stenosed. ?  Prox Cx to Mid Cx lesion is 99% stenosed. ?  Mid Cx to Dist Cx lesion is 99% stenosed. ?  1st Mrg lesion is 30% stenosed. ?  Mid LAD lesion is 100% stenosed. ?  Prox RCA lesion is 100% stenosed. ?  Origin lesion is 100% stenosed. ?  SVG. ?  LIMA graft was visualized by angiography and is normal in caliber. ?  The graft exhibits no disease. ?1.  Significant underlying three-vessel coronary artery disease with occluded mid LAD, occluded proximal right coronary artery and subtotal occlusion of the mid left circumflex after the origin of Valentine large high OM1 which supplies the majority of the left circumflex distribution. ?Patent LIMA to LAD with occluded SVG to RCA and SVG to OM. The RCA gets left-to-right collaterals. ?2.  Left ventricular angiography was not performed due to chronic kidney disease. ?3.  Right heart catheterization showed high normal filling pressures, moderate pulmonary hypertension and moderately to severely reduced cardiac output. ? ?Admitted 11/25/20 due to left arm/leg weakness. Neurology consult obtained. Brain MRI negative. CT angio of head/neck was without occlusion. Discharged the following day. Admitted 11/11/20 due to dry cough, shortness of breath and abdominal bloating. Given duonebs and IV lasix with subsequent stoppage of diuretic due to renal disease. Left inguinal hernia noted. Cardiology and surgical consults obtained. Cath completed. Discharged after 6 days.  ? ?He presents today for Valentine follow-up  visit with Valentine chief complaint of minimal fatigue upon moderate exertion. Describes this as chronic in nature having been present for  years. Has some intermittent neck pain along with this. Denies any difficulty sleeping, dizziness, abdominal distention, palpitations, chest pain, pedal edema, cough, shortness of breath or weight gain.  ? ?Currently taking farxiga 5mg  daily as he says the insurance company wouldn't approve the 10mg  dose but would approve the 5mg . He didn't want to take 2 tablets daily because he would run out sooner.  ? ?Past Medical History:  ?Diagnosis Date  ? Arthritis   ? Chronic HFrEF (heart failure with reduced ejection fraction) ([place])   ? Valentine. 10/2020 Echo: EF 25-30%, nl RV fxn, mild Benjamin.  ? CKD (chronic kidney disease), stage III ([place])   ? Coronary artery disease   ? Valentine. 2002 s/p CABG x 2: LIMA->LAD, VG->RPDA; b. 11/2020 Cath: LM 20ost, LAD 153m, LCX 99p/m, 64m/d, OM1 30, RCA 100p - fills via collats from LAD. RPL1 fills via collats from LCX. VG->PDA 100, LIMA->LAD ok-->med rx.  ? Gout   ? Hyperlipidemia LDL goal <70   ? Hypertension   ? Ischemic cardiomyopathy   ? Valentine. 10/2020 Echo: EF 25-30%.  ? Osteoarthritis   ? ?Past Surgical History:  ?Procedure Laterality Date  ? CARDIAC SURGERY    ? CABG 2002  ? LAPAROSCOPIC APPENDECTOMY N/Valentine 08/11/2018  ? Procedure: APPENDECTOMY LAPAROSCOPIC;  Surgeon: Benjamin Ree, MD;  Location: [facility];  Service: General;  Laterality: N/Valentine;  ? RIGHT/LEFT HEART CATH AND CORONARY/GRAFT ANGIOGRAPHY N/Valentine 11/15/2020  ? Procedure: RIGHT/LEFT HEART CATH AND CORONARY/GRAFT ANGIOGRAPHY;  Surgeon: Benjamin Hampshire, MD;  Location:  [facility];  Service: Cardiovascular;  Laterality: N/Valentine;  ? ?No family history on file. ?Social History  ? ?Tobacco Use  ? Smoking status: Former  ? Smokeless tobacco: Never  ?Substance Use Topics  ? Alcohol use: No  ? ?No Known Allergies ? ?Prior to Admission medications   ?Medication Sig Start Date End Date Taking? Authorizing Provider  ?allopurinol (ZYLOPRIM) 100 MG tablet Take 1 tablet (100 mg total) by mouth daily. 01/31/21  Yes Benjamin Price A, [name]  ?aspirin EC 81 MG EC  tablet Take 1 tablet (81 mg total) by mouth daily. Swallow whole. 11/27/20  Yes Benjamin Dee, MD  ?atorvastatin (LIPITOR) 40 MG tablet Take 1 tablet (40 mg total) by mouth daily. 11/24/20  Yes [name], Viann Shove, MD  ?Blood Pressure Monitoring (SPHYGMOMANOMETER) MISC 1 each by Does not apply route daily. 05/01/21  Yes Benjamin Lo, NP  ?carvedilol (COREG) 3.125 MG tablet Take 1 tablet (3.125 mg total) by mouth 2 (two) times daily with Valentine meal. 11/24/20  Yes [name], Viann Shove, MD  ?dapagliflozin propanediol (FARXIGA) 5 MG TABS tablet Take 1 tablets (5 mg total) by mouth daily. ?Patient taking differently: Take 5 mg by mouth daily. 03/22/21  Yes Benjamin Graff, [name]  ?hydrALAZINE (APRESOLINE) 50 MG tablet Take 1 tablet (50 mg total) by mouth 3 (three) times daily. 11/24/20  Yes [name], Viann Shove, MD  ?isosorbide mononitrate (IMDUR) 30 MG 24 hr tablet Take 1 tablet (30 mg total) by mouth daily. 03/22/21 06/20/21 Yes Benjamin Graff, [name]  ?naproxen sodium (ANAPROX) 550 MG tablet Take 550 mg by mouth 2 (two) times daily with Valentine meal. 03/01/21  Yes [provider]  ?spironolactone (ALDACTONE) 25 MG tablet TAKE 1 TABLET (25 MG TOTAL) BY MOUTH DAILY. 05/02/21  Yes [name], Viann Shove, MD  ?furosemide (LASIX) 20 MG tablet Take 1 tablet (20 mg total) by mouth daily as needed (As needed for weight gain of 2 pounds overnight). 12/31/20 03/31/21  Benjamin Gianotti, NP  ?lidocaine (LIDODERM) 5 % Place 1 patch onto the skin as needed. ?Patient not taking: Reported on 05/05/2021 03/01/21   [provider]  ? ? ?Review of Systems  ?Constitutional:  Positive for fatigue. Negative for appetite change and diaphoresis.  ?HENT:  Negative for congestion, postnasal drip and sore throat.   ?Eyes: Negative.   ?Respiratory:  Negative for cough, chest tightness and shortness of breath.   ?Cardiovascular:  Negative for chest pain, palpitations and leg swelling.  ?Gastrointestinal:  Negative for abdominal distention and abdominal pain.   ?Endocrine: Negative.   ?Musculoskeletal:  Positive for arthralgias (neck pain). Negative for neck pain.  ?Skin: Negative.   ?Allergic/Immunologic: Negative.   ?Neurological:  Negative for dizziness and light-headedness.  ?Hematological:  Negative for adenopathy. Does not bruise/bleed easily.  ?Psychiatric/Behavioral:  Negative for dysphoric mood and sleep disturbance (sleeping on 1 pillows). The patient is not nervous/anxious.   ? ?Vitals:  ? 05/05/21 1434  ?BP: 116/61  ?Pulse: 76  ?Resp: 18  ?SpO2: 99%  ?Weight: 207 lb (93.9 kg)  ?Height: 5\' 9"  (1.753 m)  ? ?Wt Readings from Last 3 Encounters:  ?05/05/21 207 lb (93.9 kg)  ?04/27/21 211 lb 14.4 oz (96.1 kg)  ?03/30/21 206 lb 1.6 oz (93.5 kg)  ? ?Lab Results  ?Component Value Date  ? CREATININE 1.35 (H) 05/05/2021  ? CREATININE 1.33 (H) 12/31/2020  ? CREATININE 1.57 (H) 11/26/2020  ? ?Physical Exam ?Vitals and nursing note reviewed. Exam conducted with Valentine chaperone present ("friend who is  like Valentine daughter").  ?Constitutional:   ?   General: He is not in acute distress. ?   Appearance: Normal appearance.  ?HENT:  ?   Head: Normocephalic and atraumatic.  ?Cardiovascular:  ?   Rate and Rhythm: Normal rate and regular rhythm.  ?Pulmonary:  ?   Effort: Pulmonary effort is normal. No respiratory distress.  ?   Breath sounds: No wheezing or rales.  ?Abdominal:  ?   General: Abdomen is flat. There is no distension.  ?   Palpations: Abdomen is soft.  ?Musculoskeletal:     ?   General: No tenderness.  ?   Cervical back: Normal range of motion and neck supple. No rigidity.  ?   Right lower leg: No edema.  ?   Left lower leg: No edema.  ?Skin: ?   General: Skin is warm and dry.  ?Neurological:  ?   General: No focal deficit present.  ?   Mental Status: He is alert and oriented to person, place, and time.  ?Psychiatric:     ?   Mood and Affect: Mood normal.     ?   Behavior: Behavior normal.     ?   Thought Content: Thought content normal.  ?  ?Assessment & Plan: ? ?1: Chronic  heart failure with reduced ejection fraction- ?- NYHA class II ?- euvolemic today ?- weighing daily; reminded to call for an overnight weight gain of > 2 pounds or Valentine weekly weight gain of > 5 pounds ?- weight up 4 p

## 2021-05-06 ENCOUNTER — Encounter: Payer: Self-pay | Admitting: Family

## 2021-05-20 ENCOUNTER — Other Ambulatory Visit: Payer: Self-pay | Admitting: Internal Medicine

## 2021-05-28 ENCOUNTER — Other Ambulatory Visit: Payer: Self-pay | Admitting: Internal Medicine

## 2021-06-01 ENCOUNTER — Other Ambulatory Visit: Payer: Self-pay

## 2021-06-01 ENCOUNTER — Emergency Department
Admission: EM | Admit: 2021-06-01 | Discharge: 2021-06-01 | Disposition: A | Discharge status: Home / Self Care | Payer: Medicare HMO | Attending: Emergency Medicine | Admitting: Emergency Medicine

## 2021-06-01 ENCOUNTER — Encounter: Payer: Self-pay | Admitting: Emergency Medicine

## 2021-06-01 ENCOUNTER — Emergency Department: Payer: Medicare HMO

## 2021-06-01 DIAGNOSIS — R519 Headache, unspecified: Secondary | ICD-10-CM | POA: Diagnosis not present

## 2021-06-01 DIAGNOSIS — Z951 Presence of aortocoronary bypass graft: Secondary | ICD-10-CM | POA: Insufficient documentation

## 2021-06-01 DIAGNOSIS — I2510 Atherosclerotic heart disease of native coronary artery without angina pectoris: Secondary | ICD-10-CM | POA: Diagnosis not present

## 2021-06-01 DIAGNOSIS — I130 Hypertensive heart and chronic kidney disease with heart failure and stage 1 through stage 4 chronic kidney disease, or unspecified chronic kidney disease: Secondary | ICD-10-CM | POA: Insufficient documentation

## 2021-06-01 DIAGNOSIS — R079 Chest pain, unspecified: Secondary | ICD-10-CM | POA: Diagnosis not present

## 2021-06-01 DIAGNOSIS — I509 Heart failure, unspecified: Secondary | ICD-10-CM | POA: Diagnosis not present

## 2021-06-01 DIAGNOSIS — R0789 Other chest pain: Secondary | ICD-10-CM | POA: Diagnosis not present

## 2021-06-01 DIAGNOSIS — N189 Chronic kidney disease, unspecified: Secondary | ICD-10-CM | POA: Insufficient documentation

## 2021-06-01 DIAGNOSIS — R42 Dizziness and giddiness: Secondary | ICD-10-CM | POA: Diagnosis not present

## 2021-06-01 LAB — MAGNESIUM: Magnesium: 1.8 mg/dL (ref 1.7–2.4)

## 2021-06-01 LAB — TROPONIN I (HIGH SENSITIVITY)
Troponin I (High Sensitivity): 24 ng/L — ABNORMAL HIGH (ref <18)
Troponin I (High Sensitivity): 25 ng/L — ABNORMAL HIGH (ref <18)

## 2021-06-01 LAB — BASIC METABOLIC PANEL
Anion gap: 6 (ref 5–15)
BUN: 15 mg/dL (ref 8–23)
CO2: 21 mmol/L — ABNORMAL LOW (ref 22–32)
Calcium: 8.3 mg/dL — ABNORMAL LOW (ref 8.9–10.3)
Chloride: 108 mmol/L (ref 98–111)
Creatinine, Ser: 1.27 mg/dL — ABNORMAL HIGH (ref 0.61–1.24)
GFR, Estimated: 59 mL/min — ABNORMAL LOW (ref >60)
Glucose, Bld: 200 mg/dL — ABNORMAL HIGH (ref 70–99)
Potassium: 3.7 mmol/L (ref 3.5–5.1)
Sodium: 135 mmol/L (ref 135–145)

## 2021-06-01 LAB — CBC
HCT: 41.4 % (ref 39.0–52.0)
Hemoglobin: 12.7 g/dL — ABNORMAL LOW (ref 13.0–17.0)
MCH: 26.5 pg (ref 26.0–34.0)
MCHC: 30.7 g/dL (ref 30.0–36.0)
MCV: 86.4 fL (ref 80.0–100.0)
Platelets: 363 10*3/uL (ref 150–400)
RBC: 4.79 MIL/uL (ref 4.22–5.81)
RDW: 14.8 % (ref 11.5–15.5)
WBC: 8.3 10*3/uL (ref 4.0–10.5)
nRBC: 0 % (ref 0.0–0.2)

## 2021-06-01 LAB — HEPATIC FUNCTION PANEL
ALT: 11 U/L (ref 0–44)
AST: 18 U/L (ref 15–41)
Albumin: 3.5 g/dL (ref 3.5–5.0)
Alkaline Phosphatase: 56 U/L (ref 38–126)
Bilirubin, Direct: 0.1 mg/dL (ref 0.0–0.2)
Indirect Bilirubin: 0.5 mg/dL (ref 0.3–0.9)
Total Bilirubin: 0.6 mg/dL (ref 0.3–1.2)
Total Protein: 7.2 g/dL (ref 6.5–8.1)

## 2021-06-01 MED ORDER — ACETAMINOPHEN 500 MG PO TABS
1000.0000 mg | ORAL_TABLET | Freq: Once | ORAL | Status: AC
  Administered 2021-06-01: 1000 mg via ORAL
  Filled 2021-06-01: qty 2

## 2021-06-01 NOTE — ED Triage Notes (Signed)
Pt comes into the ED via POV c/o left chest pain, dizziness, and headache.  Pt states he is concerned he may have taken his daily medications x2 by accident.  Pt is unsure of what medications are in his morning pill box.  PT currently in NAD at this time with even and unlabored respirations.  ?

## 2021-06-01 NOTE — ED Notes (Signed)
Ambulatory pulse ox 94%

## 2021-06-01 NOTE — ED Notes (Signed)
First Nurse Note:  Pt to ED via POV stating that he accidentally took his medications twice. Pt states that he has his pill box and is not sure what medication it was that he took. Pt is in NAD.   ?

## 2021-06-01 NOTE — ED Provider Notes (Signed)
? ?[facility] ?Provider Note ? ? ? Event Date/Time  ? First MD Initiated Contact with Patient 06/01/21 [phone]   ?  (approximate) ? ? ?History  ? ?Dizziness, Headache, and Chest Pain ? ? ?HPI ? ?Benjamin Valentine is a 76 y.o. male with a past medical history of CAD s/p CABG 2022, CHF, CKD, HTN, HDL, gout, osteoarthritis who presents for evaluation of some chest tightness associated with lightheadedness and slight headache which he states started shortly after he took a double dose of his morning medications.  He states he did not take his morning medication yesterday and took a double dose today by accident.  He is not sure what they were but on review of records is included: ?3.125 mg of Coreg, 50 mg of hydralazine, 40 mg of Lipitor,, dapagliflozin, spironolactone, isosorbide, naproxen, Lasix aspirin and allopurinol. ? ?He states he currently feels much better and chest pain only last a few minutes.  He denies any nausea, vomiting, diarrhea, urinary symptoms, back pain, abdominal pain or any focal extremity weakness numbness or tingling.  No vision changes. ? ?  ? ? ?Physical Exam  ?Triage Vital Signs: ?ED Triage Vitals [06/01/21 0943]  ?Enc Vitals Group  ?   BP (!) 107/58  ?   Pulse Rate 80  ?   Resp 17  ?   Temp 97.8 ?F (36.6 ?C)  ?   Temp Source Oral  ?   SpO2 97 %  ?   Weight 207 lb 0.2 oz (93.9 kg)  ?   Height 5\' 9"  (1.753 m)  ?   Head Circumference   ?   Peak Flow   ?   Pain Score 0  ?   Pain Loc   ?   Pain Edu?   ?   Excl. in GC?   ? ? ?Most recent vital signs: ?Vitals:  ? 06/01/21 0955 06/01/21 1130  ?BP: 108/74 140/76  ?Pulse: 79 74  ?Resp: 17 14  ?Temp:    ?SpO2: 97% 95%  ? ? ?General: Awake, no distress.  ?CV:  Good peripheral perfusion.  2+ radial pulse. ?Resp:  Normal effort.  Clear bilaterally. ?Abd:  No distention.  Soft. ?Other:  Cranial nerves II through XII grossly intact.  No pronator drift.  No finger dysmetria.  Symmetric 5/5 strength of all extremities.  Sensation intact to  light touch in all extremities.   ? ? ? ?ED Results / Procedures / Treatments  ?Labs ?(all labs ordered are listed, but only abnormal results are displayed) ?Labs Reviewed  ?BASIC METABOLIC PANEL - Abnormal; Notable for the following components:  ?    Result Value  ? CO2 21 (*)   ? Glucose, Bld 200 (*)   ? Creatinine, Ser 1.27 (*)   ? Calcium 8.3 (*)   ? GFR, Estimated 59 (*)   ? All other components within normal limits  ?CBC - Abnormal; Notable for the following components:  ? Hemoglobin 12.7 (*)   ? All other components within normal limits  ?TROPONIN I (HIGH SENSITIVITY) - Abnormal; Notable for the following components:  ? Troponin I (High Sensitivity) 24 (*)   ? All other components within normal limits  ?TROPONIN I (HIGH SENSITIVITY) - Abnormal; Notable for the following components:  ? Troponin I (High Sensitivity) 25 (*)   ? All other components within normal limits  ?MAGNESIUM  ?HEPATIC FUNCTION PANEL  ? ? ? ?EKG ? ?ECG is remarkable for sinus rhythm with first-degree AV  block with a PR interval of 228 with PVCs, normal axis, QTc interval of 500 and some ST depression T wave inversion in inferior and lateral leads without other clear evidence of acute ischemia. ? ?These appear relatively new when compared to ECG from 12/31/2020. ? ?RADIOLOGY ?Chest reviewed by myself shows no focal consoidation, effusion, edema, pneumothorax or other clear acute thoracic process. I also reviewed radiology interpretation and agree with findings described. ? ? ? ?PROCEDURES: ? ?Critical Care performed: No ? ?.1-3 Lead EKG Interpretation ?Performed by: Gilles Chiquito, MD ?Authorized by: Gilles Chiquito, MD  ? ?  Interpretation: normal   ?  ECG rate assessment: normal   ?  Rhythm: sinus rhythm   ?  Ectopy: none   ?  Conduction: normal   ? ?The patient is on the cardiac monitor to evaluate for evidence of arrhythmia and/or significant heart rate changes. ? ? ?MEDICATIONS ORDERED IN ED: ?Medications  ?acetaminophen (TYLENOL)  tablet 1,000 mg (1,000 mg Oral Given 06/01/21 1214)  ? ? ? ?IMPRESSION / MDM / ASSESSMENT AND PLAN / ED COURSE  ?I reviewed the triage vital signs and the nursing notes. ?             ?               ? ?Differential diagnosis includes, but is not limited to effects of extra doses of medications taken this morning including possibly arrhythmia, symptomatic bradycardia, hypotension, ACS, pneumonia, pneumothorax, symptomatic effusion or pneumonia or bronchitis.  He has no ongoing chest pain symptoms or shortness of breath or abnormal vital signs at this time to suggest a dissection or PE. ? ?ECG is remarkable for sinus rhythm with first-degree AV block with a PR interval of 228 with PVCs, normal axis, QTc interval of 500 and some ST depression T wave inversion in inferior and lateral leads without other clear evidence of acute ischemia. ? ?These appear relatively new when compared to ECG from 12/31/2020. ? ?Troponins are stable at 24 and 25 compared to 5952 6 months ago. ? ?Chest reviewed by myself shows no focal consoidation, effusion, edema, pneumothorax or other clear acute thoracic process. I also reviewed radiology interpretation and agree with findings described. ? ?BMP is remarkable for glucose of 200 without any other significant electrolyte or metabolic derangements.  Kidney function is at baseline.  CBC without leukocytosis and hemoglobin of 12.7 without recent to compare to.  Hepatic function panel unremarkable.  Magnesium within normal limits ? ?Patient observed for 3 hours and 45 minutes and had no recurrence of his chest pain or dizziness.  He remains hemodynamically stable awake and alert and oriented without any focal neurological deficits on reassessment..  He states he feels back to baseline.  Do not believe he requires further monitoring or taking double doses of his morning medications at this point.  With regard to his chest pain he reports a few minutes of earlier I discussed with on-call  cardiologist Dr.  ? ?Agbor-Etang  ?Reviewed patient's EKGs and troponins.  EKG and recommends no additional monitoring or interventions at this time and the patient can follow-up outpatient. ? ? ?at this point I have low suspicion for immediately life-threatening ?Process and given cardiology does not recommend observation for new EKG changes think he is appropriate for outpatient follow-up.  Discharged in stable condition.  Strict return precautions advised and discussed.  Emphasized importance of carefully reviewing medications before taking them at home. ?  ? ? ?FINAL CLINICAL IMPRESSION(S) /  ED DIAGNOSES  ? ?Final diagnoses:  ?Chest pain, unspecified type  ?Dizziness  ? ? ? ?Rx / DC Orders  ? ?ED Discharge Orders   ? ? None  ? ?  ? ? ? ?Note:  This document was prepared using Dragon voice recognition software and may include unintentional dictation errors. ?  ?Gilles Chiquito, MD ?06/01/21 1319 ? ?

## 2021-06-02 ENCOUNTER — Encounter: Payer: Self-pay | Admitting: Family

## 2021-06-02 ENCOUNTER — Ambulatory Visit: Payer: Medicare HMO | Attending: Family | Admitting: Family

## 2021-06-02 VITALS — BP 128/61 | HR 70 | Resp 20 | Ht 69.0 in | Wt 209.4 lb

## 2021-06-02 DIAGNOSIS — Z87891 Personal history of nicotine dependence: Secondary | ICD-10-CM | POA: Diagnosis not present

## 2021-06-02 DIAGNOSIS — I130 Hypertensive heart and chronic kidney disease with heart failure and stage 1 through stage 4 chronic kidney disease, or unspecified chronic kidney disease: Secondary | ICD-10-CM | POA: Insufficient documentation

## 2021-06-02 DIAGNOSIS — I10 Essential (primary) hypertension: Secondary | ICD-10-CM | POA: Diagnosis not present

## 2021-06-02 DIAGNOSIS — N183 Chronic kidney disease, stage 3 unspecified: Secondary | ICD-10-CM | POA: Diagnosis not present

## 2021-06-02 DIAGNOSIS — Z951 Presence of aortocoronary bypass graft: Secondary | ICD-10-CM | POA: Diagnosis not present

## 2021-06-02 DIAGNOSIS — I2510 Atherosclerotic heart disease of native coronary artery without angina pectoris: Secondary | ICD-10-CM

## 2021-06-02 DIAGNOSIS — I5022 Chronic systolic (congestive) heart failure: Secondary | ICD-10-CM | POA: Insufficient documentation

## 2021-06-02 NOTE — Patient Instructions (Signed)
Continue weighing daily and call for an overnight weight gain of 3 pounds or more or a weekly weight gain of more than 5 pounds.   If you have voicemail, please make sure your mailbox is cleaned out so that we may leave a message and please make sure to listen to any voicemails.     

## 2021-06-02 NOTE — Progress Notes (Signed)
? Patient ID: Benjamin Valentine, male    DOB: 03-31-1945, 76 y.o.   MRN: [id number] ? ? ?Benjamin Valentine is a 76 y/o male with a history of CAD (CABG), HTN, arthritis, gout, previous tobacco use and chronic heart failure.  ? ?Echo report from 11/11/20 reviewed and showed an EF of 25-30% along with mild Benjamin.  ? ?RHC/LHC done 11/15/20 and showed: ?Ost LM lesion is 20% stenosed. ?  Prox Cx to Mid Cx lesion is 99% stenosed. ?  Mid Cx to Dist Cx lesion is 99% stenosed. ?  1st Mrg lesion is 30% stenosed. ?  Mid LAD lesion is 100% stenosed. ?  Prox RCA lesion is 100% stenosed. ?  Origin lesion is 100% stenosed. ?  SVG. ?  LIMA graft was visualized by angiography and is normal in caliber. ?  The graft exhibits no disease. ?1.  Significant underlying three-vessel coronary artery disease with occluded mid LAD, occluded proximal right coronary artery and subtotal occlusion of the mid left circumflex after the origin of a large high OM1 which supplies the majority of the left circumflex distribution. ?Patent LIMA to LAD with occluded SVG to RCA and SVG to OM. The RCA gets left-to-right collaterals. ?2.  Left ventricular angiography was not performed due to chronic kidney disease. ?3.  Right heart catheterization showed high normal filling pressures, moderate pulmonary hypertension and moderately to severely reduced cardiac output. ? ?Was in the ED 06/01/21 due to dizziness, headache and chest pain. Accidentally took a double dose of his medications today. Symptoms improved and he was released.  ? ?He presents today for a follow-up visit with a chief complaint of minimal fatigue upon moderate exertion. Describes this as chronic in nature having been present intermittently for several years. He has associated headaches & intermittent neck pain along with this. He denies any difficulty sleeping, dizziness, abdominal distention, palpitations, pedal edema, chest pain, shortness of breath, cough or weight gain.  ? ?Isn't quite sure how he took a  double dose of medications yesterday except that he was talking on the phone and couldn't remember if he had taken them so he took them again, just in case.  ? ?Past Medical History:  ?Diagnosis Date  ? Arthritis   ? Chronic HFrEF (heart failure with reduced ejection fraction) ([place])   ? a. 10/2020 Echo: EF 25-30%, nl RV fxn, mild Benjamin.  ? CKD (chronic kidney disease), stage III ([place])   ? Coronary artery disease   ? a. 2002 s/p CABG x 2: LIMA->LAD, VG->RPDA; b. 11/2020 Cath: LM 20ost, LAD 132m, LCX 99p/m, 40m/d, OM1 30, RCA 100p - fills via collats from LAD. RPL1 fills via collats from LCX. VG->PDA 100, LIMA->LAD ok-->med rx.  ? Gout   ? Hyperlipidemia LDL goal <70   ? Hypertension   ? Ischemic cardiomyopathy   ? a. 10/2020 Echo: EF 25-30%.  ? Osteoarthritis   ? ?Past Surgical History:  ?Procedure Laterality Date  ? CARDIAC SURGERY    ? CABG 2002  ? LAPAROSCOPIC APPENDECTOMY N/A 08/11/2018  ? Procedure: APPENDECTOMY LAPAROSCOPIC;  Surgeon: Olean Ree, MD;  Location: [facility];  Service: General;  Laterality: N/A;  ? RIGHT/LEFT HEART CATH AND CORONARY/GRAFT ANGIOGRAPHY N/A 11/15/2020  ? Procedure: RIGHT/LEFT HEART CATH AND CORONARY/GRAFT ANGIOGRAPHY;  Surgeon: Wellington Hampshire, MD;  Location: [facility];  Service: Cardiovascular;  Laterality: N/A;  ? ?No family history on file. ?Social History  ? ?Tobacco Use  ? Smoking status: Former  ? Smokeless tobacco:  Never  ?Substance Use Topics  ? Alcohol use: No  ? ?No Known Allergies ? ?Prior to Admission medications   ?Medication Sig Start Date End Date Taking? Authorizing Provider  ?allopurinol (ZYLOPRIM) 100 MG tablet Take 1 tablet (100 mg total) by mouth daily. 01/31/21  Yes Darylene Price A, [name]  ?aspirin EC 81 MG EC tablet Take 1 tablet (81 mg total) by mouth daily. Swallow whole. 11/27/20  Yes Dwyane Dee, MD  ?atorvastatin (LIPITOR) 40 MG tablet TAKE 1 TABLET BY MOUTH EVERY DAY 05/23/21  Yes [name], Viann Shove, MD  ?carvedilol (COREG) 3.125 MG tablet TAKE 1 TABLET  (3.125 MG TOTAL) BY MOUTH 2 (TWO) TIMES DAILY WITH A MEAL. 05/30/21  Yes [name], Viann Shove, MD  ?dapagliflozin propanediol (FARXIGA) 5 MG TABS tablet Take 2 tablets (10 mg total) by mouth daily. ?Patient taking differently: Take 5 mg by mouth daily. 03/22/21  Yes Alisa Graff, [name]  ?hydrALAZINE (APRESOLINE) 50 MG tablet TAKE 1 TABLET BY MOUTH THREE TIMES A DAY 05/30/21  Yes [name], Viann Shove, MD  ?isosorbide mononitrate (IMDUR) 30 MG 24 hr tablet Take 1 tablet (30 mg total) by mouth daily. 03/22/21 06/20/21 Yes Alisa Graff, [name]  ?naproxen sodium (ANAPROX) 550 MG tablet Take 550 mg by mouth 2 (two) times daily with a meal. 03/01/21  Yes [provider]  ?spironolactone (ALDACTONE) 25 MG tablet TAKE 1 TABLET (25 MG TOTAL) BY MOUTH DAILY. 05/02/21  Yes [name], Viann Shove, MD  ?Blood Pressure Monitoring (SPHYGMOMANOMETER) MISC 1 each by Does not apply route daily. 05/01/21   Theresia Lo, NP  ?furosemide (LASIX) 20 MG tablet Take 1 tablet (20 mg total) by mouth daily as needed (As needed for weight gain of 2 pounds overnight). 12/31/20 03/31/21  Theora Gianotti, NP  ?lidocaine (LIDODERM) 5 % Place 1 patch onto the skin as needed. ?Patient not taking: Reported on 06/02/2021 03/01/21   [provider]  ? ?Review of Systems  ?Constitutional:  Positive for fatigue. Negative for appetite change and diaphoresis.  ?HENT:  Negative for congestion, postnasal drip and sore throat.   ?Eyes: Negative.   ?Respiratory:  Negative for cough, chest tightness and shortness of breath.   ?Cardiovascular:  Negative for chest pain, palpitations and leg swelling.  ?Gastrointestinal:  Negative for abdominal distention and abdominal pain.  ?Endocrine: Negative.   ?Musculoskeletal:  Positive for arthralgias (neck pain). Negative for neck pain.  ?Skin: Negative.   ?Allergic/Immunologic: Negative.   ?Neurological:  Positive for headaches. Negative for dizziness and light-headedness.  ?Hematological:  Negative for adenopathy. Does not  bruise/bleed easily.  ?Psychiatric/Behavioral:  Negative for dysphoric mood and sleep disturbance (sleeping on 1 pillows). The patient is not nervous/anxious.   ? ?Vitals:  ? 06/02/21 1436  ?BP: 128/61  ?Pulse: 70  ?Resp: 20  ?SpO2: 97%  ?Weight: 209 lb 6 oz (95 kg)  ?Height: 5\' 9"  (1.753 m)  ? ?Wt Readings from Last 3 Encounters:  ?06/02/21 209 lb 6 oz (95 kg)  ?06/01/21 207 lb 0.2 oz (93.9 kg)  ?05/05/21 207 lb (93.9 kg)  ? ?Lab Results  ?Component Value Date  ? CREATININE 1.27 (H) 06/01/2021  ? CREATININE 1.35 (H) 05/05/2021  ? CREATININE 1.33 (H) 12/31/2020  ? ?Physical Exam ?Vitals and nursing note reviewed. Exam conducted with a chaperone present ("friend who is like a daughter").  ?Constitutional:   ?   General: He is not in acute distress. ?   Appearance: Normal appearance.  ?HENT:  ?   Head: Normocephalic and atraumatic.  ?  Cardiovascular:  ?   Rate and Rhythm: Normal rate and regular rhythm.  ?Pulmonary:  ?   Effort: Pulmonary effort is normal. No respiratory distress.  ?   Breath sounds: No wheezing or rales.  ?Abdominal:  ?   General: Abdomen is flat. There is no distension.  ?   Palpations: Abdomen is soft.  ?Musculoskeletal:     ?   General: No tenderness.  ?   Cervical back: Normal range of motion and neck supple. No rigidity.  ?   Right lower leg: No edema.  ?   Left lower leg: No edema.  ?Skin: ?   General: Skin is warm and dry.  ?Neurological:  ?   General: No focal deficit present.  ?   Mental Status: He is alert and oriented to person, place, and time.  ?Psychiatric:     ?   Mood and Affect: Mood normal.     ?   Behavior: Behavior normal.     ?   Thought Content: Thought content normal.  ?  ?Assessment & Plan: ? ?1: Chronic heart failure with reduced ejection fraction- ?- NYHA class II ?- euvolemic today ?- weighing daily; reminded to call for an overnight weight gain of > 2 pounds or a weekly weight gain of > 5 pounds ?- weight up 2 pounds from last visit here 1 month ago ?- not adding salt and  friend ("daughter") does most of the cooking and doesn't cook with salt either ?- on GDMT of carvedilol, farxiga & spironolactone ?- consider adding entresto in the future if renal function continues to

## 2021-06-29 ENCOUNTER — Encounter: Payer: Self-pay | Admitting: Internal Medicine

## 2021-06-29 ENCOUNTER — Ambulatory Visit (INDEPENDENT_AMBULATORY_CARE_PROVIDER_SITE_OTHER): Payer: Medicare HMO | Admitting: Internal Medicine

## 2021-06-29 VITALS — BP 140/64 | HR 73 | Ht 69.0 in | Wt 209.6 lb

## 2021-06-29 DIAGNOSIS — M109 Gout, unspecified: Secondary | ICD-10-CM

## 2021-06-29 DIAGNOSIS — R42 Dizziness and giddiness: Secondary | ICD-10-CM | POA: Diagnosis not present

## 2021-06-29 DIAGNOSIS — E785 Hyperlipidemia, unspecified: Secondary | ICD-10-CM

## 2021-06-29 DIAGNOSIS — Z6831 Body mass index (BMI) 31.0-31.9, adult: Secondary | ICD-10-CM

## 2021-06-29 DIAGNOSIS — N1831 Chronic kidney disease, stage 3a: Secondary | ICD-10-CM

## 2021-06-29 DIAGNOSIS — M1A3790 Chronic gout due to renal impairment, unspecified ankle and foot, without tophus (tophi): Secondary | ICD-10-CM

## 2021-06-29 DIAGNOSIS — I129 Hypertensive chronic kidney disease with stage 1 through stage 4 chronic kidney disease, or unspecified chronic kidney disease: Secondary | ICD-10-CM | POA: Diagnosis not present

## 2021-06-29 DIAGNOSIS — I10 Essential (primary) hypertension: Secondary | ICD-10-CM

## 2021-06-29 DIAGNOSIS — E669 Obesity, unspecified: Secondary | ICD-10-CM | POA: Diagnosis not present

## 2021-06-29 MED ORDER — ALLOPURINOL 100 MG PO TABS: 100.0000 mg | ORAL_TABLET | Freq: Every day | ORAL | 3 refills | Status: DC

## 2021-06-29 MED ORDER — FUROSEMIDE 20 MG PO TABS: 20.0000 mg | ORAL_TABLET | Freq: Every day | ORAL | 3 refills | Status: DC | PRN

## 2021-06-29 MED ORDER — ISOSORBIDE MONONITRATE ER 30 MG PO TB24
30.0000 mg | ORAL_TABLET | Freq: Every day | ORAL | 3 refills | Status: DC
Start: 2021-06-29 — End: 2022-09-22

## 2021-06-29 NOTE — Assessment & Plan Note (Signed)
Stable

## 2021-06-29 NOTE — Assessment & Plan Note (Signed)
No recurrence of the gout ?

## 2021-06-29 NOTE — Progress Notes (Signed)
? ?Established Patient Office Visit ? ?Subjective:  ?Patient ID: Benjamin Valentine, male    DOB: 1945/03/05  Age: 76 y.o. MRN: [id number] ? ?CC:  ?Chief Complaint  ?Patient presents with  ? Hypertension  ? ? ?Hypertension ? ? ?Benjamin Valentine presents for dissiness ? ?Past Medical History:  ?Diagnosis Date  ? Arthritis   ? Chronic HFrEF (heart failure with reduced ejection fraction) ([place])   ? a. 10/2020 Echo: EF 25-30%, nl RV fxn, mild MR.  ? CKD (chronic kidney disease), stage III ([place])   ? Coronary artery disease   ? a. 2002 s/p CABG x 2: LIMA->LAD, VG->RPDA; b. 11/2020 Cath: LM 20ost, LAD 171m LCX 99p/m, 963m, OM1 30, RCA 100p - fills via collats from LAD. RPL1 fills via collats from LCX. VG->PDA 100, LIMA->LAD ok-->med rx.  ? Gout   ? Hyperlipidemia LDL goal <70   ? Hypertension   ? Ischemic cardiomyopathy   ? a. 10/2020 Echo: EF 25-30%.  ? Osteoarthritis   ? ? ?Past Surgical History:  ?Procedure Laterality Date  ? CARDIAC SURGERY    ? CABG 2002  ? LAPAROSCOPIC APPENDECTOMY N/A 08/11/2018  ? Procedure: APPENDECTOMY LAPAROSCOPIC;  Surgeon: PiOlean ReeMD;  Location: [facility];  Service: General;  Laterality: N/A;  ? RIGHT/LEFT HEART CATH AND CORONARY/GRAFT ANGIOGRAPHY N/A 11/15/2020  ? Procedure: RIGHT/LEFT HEART CATH AND CORONARY/GRAFT ANGIOGRAPHY;  Surgeon: ArWellington HampshireMD;  [facility];  Service: Cardiovascular;  Laterality: N/A;  ? ? ?History reviewed. Valentine pertinent family history. ? ?Social History  ? ?Socioeconomic History  ? Marital status: Widowed  ?  Spouse name: Not on file  ? Number of children: Not on file  ? Years of education: Not on file  ? Highest education level: Not on file  ?Occupational History  ? Not on file  ?Tobacco Use  ? Smoking status: Former  ? Smokeless tobacco: Never  ?Vaping Use  ? Vaping Use: Never used  ?Substance and Sexual Activity  ? Alcohol use: Valentine  ? Drug use: Valentine  ? Sexual activity: Not on file  ?Other Topics Concern  ? Not on file  ?Social History Narrative   ? Not on file  ? ?Social Determinants of Health  ? ?Financial Resource Strain: Low Risk   ? Difficulty of Paying Living Expenses: Not very hard  ?Food Insecurity: Valentine Food Insecurity  ? Worried About RuCharity fundraisern the Last Year: Never true  ? Ran Out of Food in the Last Year: Never true  ?Transportation Needs: Valentine Transportation Needs  ? Lack of Transportation (Medical): Valentine  ? Lack of Transportation (Non-Medical): Valentine  ?Physical Activity: Insufficiently Active  ? Days of Exercise per Week: 2 days  ? Minutes of Exercise per Session: 20 min  ?Stress: Stress Concern Present  ? Feeling of Stress : To some extent  ?Social Connections: Socially Isolated  ? Frequency of Communication with Friends and Family: More than three times a week  ? Frequency of Social Gatherings with Friends and Family: More than three times a week  ? Attends Religious Services: Never  ? Active Member of Clubs or Organizations: Valentine  ? Attends ClArchivisteetings: Never  ? Marital Status: Widowed  ?Intimate Partner Violence: Not At Risk  ? Fear of Current or Ex-Partner: Valentine  ? Emotionally Abused: Valentine  ? Physically Abused: Valentine  ? Sexually Abused: Valentine  ? ? ? ?Current Outpatient Medications:  ?  aspirin EC 81 MG  EC tablet, Take 1 tablet (81 mg total) by mouth daily. Swallow whole., Disp: 30 tablet, Rfl: 11 ?  atorvastatin (LIPITOR) 40 MG tablet, TAKE 1 TABLET BY MOUTH EVERY DAY, Disp: 90 tablet, Rfl: 2 ?  Blood Pressure Monitoring (SPHYGMOMANOMETER) MISC, 1 each by Does not apply route daily., Disp: 1 each, Rfl: 0 ?  carvedilol (COREG) 3.125 MG tablet, TAKE 1 TABLET (3.125 MG TOTAL) BY MOUTH 2 (TWO) TIMES DAILY WITH A MEAL., Disp: 180 tablet, Rfl: 2 ?  dapagliflozin propanediol (FARXIGA) 5 MG TABS tablet, Take 2 tablets (10 mg total) by mouth daily. (Patient taking differently: Take 5 mg by mouth daily.), Disp: 30 tablet, Rfl: 5 ?  hydrALAZINE (APRESOLINE) 50 MG tablet, TAKE 1 TABLET BY MOUTH THREE TIMES A DAY, Disp: 270 tablet, Rfl: 2 ?   lidocaine (LIDODERM) 5 %, Place 1 patch onto the skin as needed., Disp: , Rfl:  ?  naproxen sodium (ANAPROX) 550 MG tablet, Take 550 mg by mouth 2 (two) times daily with a meal., Disp: , Rfl:  ?  spironolactone (ALDACTONE) 25 MG tablet, TAKE 1 TABLET (25 MG TOTAL) BY MOUTH DAILY., Disp: 90 tablet, Rfl: 1 ?  allopurinol (ZYLOPRIM) 100 MG tablet, Take 1 tablet (100 mg total) by mouth daily., Disp: 90 tablet, Rfl: 3 ?  furosemide (LASIX) 20 MG tablet, Take 1 tablet (20 mg total) by mouth daily as needed (As needed for weight gain of 2 pounds overnight)., Disp: 90 tablet, Rfl: 3 ?  isosorbide mononitrate (IMDUR) 30 MG 24 hr tablet, Take 1 tablet (30 mg total) by mouth daily., Disp: 90 tablet, Rfl: 3  ? ?Valentine Known Allergies ? ?ROS ?Review of Systems  ?Constitutional: Negative.   ?HENT: Negative.    ?Eyes: Negative.   ?Respiratory: Negative.    ?Cardiovascular: Negative.   ?Gastrointestinal: Negative.   ?Endocrine: Negative.   ?Genitourinary: Negative.   ?Musculoskeletal: Negative.   ?Skin: Negative.   ?Allergic/Immunologic: Negative.   ?Neurological: Negative.   ?Hematological: Negative.   ?Psychiatric/Behavioral: Negative.    ?All other systems reviewed and are negative. ? ?  ?Objective:  ?  ?Physical Exam ?Vitals reviewed.  ?Constitutional:   ?   Appearance: Normal appearance.  ?HENT:  ?   Head: Normocephalic.  ?   Right Ear: Tympanic membrane normal.  ?   Left Ear: Tympanic membrane normal.  ?   Mouth/Throat:  ?   Mouth: Mucous membranes are moist.  ?   Pharynx: Oropharynx is clear.  ?Eyes:  ?   Pupils: Pupils are equal, round, and reactive to light.  ?Neck:  ?   Vascular: Valentine carotid bruit.  ?Cardiovascular:  ?   Rate and Rhythm: Normal rate and regular rhythm.  ?   Pulses: Normal pulses.  ?   Heart sounds: Normal heart sounds.  ?Pulmonary:  ?   Effort: Pulmonary effort is normal.  ?   Breath sounds: Normal breath sounds.  ?Abdominal:  ?   General: Bowel sounds are normal.  ?   Palpations: Abdomen is soft. There is Valentine  hepatomegaly, splenomegaly or mass.  ?   Tenderness: There is Valentine abdominal tenderness.  ?   Hernia: Valentine hernia is present.  ?Musculoskeletal:  ?   Cervical back: Neck supple. Valentine rigidity or tenderness.  ?   Right lower leg: Valentine edema.  ?   Left lower leg: Valentine edema.  ?Lymphadenopathy:  ?   Cervical: Valentine cervical adenopathy.  ?Skin: ?   Findings: Valentine rash.  ?Neurological:  ?  Mental Status: He is alert and oriented to person, place, and time.  ?   Motor: Valentine weakness.  ?Psychiatric:     ?   Mood and Affect: Mood normal.     ?   Behavior: Behavior normal.  ? ? ?BP 140/64   Pulse 73   Ht 5' 9" (1.753 m)   Wt 209 lb 9.6 oz (95.1 kg)   BMI 30.95 kg/m?  ?Wt Readings from Last 3 Encounters:  ?06/29/21 209 lb 9.6 oz (95.1 kg)  ?06/02/21 209 lb 6 oz (95 kg)  ?06/01/21 207 lb 0.2 oz (93.9 kg)  ? ? ? ?Health Maintenance Due  ?Topic Date Due  ? COVID-19 Vaccine (1) Never done  ? Pneumonia Vaccine 33+ Years old (1 - PCV) Never done  ? Hepatitis C Screening  Never done  ? TETANUS/TDAP  Never done  ? Zoster Vaccines- Shingrix (1 of 2) Never done  ? ? ?There are Valentine preventive care reminders to display for this patient. ? ?Valentine results found for: TSH ?Lab Results  ?Component Value Date  ? WBC 8.3 06/01/2021  ? HGB 12.7 (L) 06/01/2021  ? HCT 41.4 06/01/2021  ? MCV 86.4 06/01/2021  ? PLT 363 06/01/2021  ? ?Lab Results  ?Component Value Date  ? NA 135 06/01/2021  ? K 3.7 06/01/2021  ? CO2 21 (L) 06/01/2021  ? GLUCOSE 200 (H) 06/01/2021  ? BUN 15 06/01/2021  ? CREATININE 1.27 (H) 06/01/2021  ? BILITOT 0.6 06/01/2021  ? ALKPHOS 56 06/01/2021  ? AST 18 06/01/2021  ? ALT 11 06/01/2021  ? PROT 7.2 06/01/2021  ? ALBUMIN 3.5 06/01/2021  ? CALCIUM 8.3 (L) 06/01/2021  ? ANIONGAP 6 06/01/2021  ? EGFR 55 (L) 12/31/2020  ? ?Lab Results  ?Component Value Date  ? CHOL 110 11/12/2020  ? ?Lab Results  ?Component Value Date  ? HDL 22 (L) 11/12/2020  ? ?Lab Results  ?Component Value Date  ? [place] 70 11/12/2020  ? ?Lab Results  ?Component Value Date  ?  TRIG 91 11/12/2020  ? ?Lab Results  ?Component Value Date  ? CHOLHDL 5.0 11/12/2020  ? ?Lab Results  ?Component Value Date  ? HGBA1C 6.0 (H) 11/25/2020  ? ? ?  ?Assessment & Plan:  ?Lightheadedness ? ?[place]

## 2021-06-29 NOTE — Assessment & Plan Note (Signed)

## 2021-06-29 NOTE — Assessment & Plan Note (Signed)
Hypercholesterolemia  I advised the patient to follow Mediterranean diet This diet is rich in fruits vegetables and whole grain, and This diet is also rich in fish and lean meat Patient should also eat a handful of almonds or walnuts daily Recent heart study indicated that average follow-up on this kind of diet reduces the cardiovascular mortality by 50 to 70%== 

## 2021-06-29 NOTE — Assessment & Plan Note (Signed)
Blood pressure stable at the present time 

## 2021-07-27 ENCOUNTER — Ambulatory Visit (INDEPENDENT_AMBULATORY_CARE_PROVIDER_SITE_OTHER): Payer: Medicare HMO | Admitting: Internal Medicine

## 2021-07-27 ENCOUNTER — Encounter: Payer: Self-pay | Admitting: Internal Medicine

## 2021-07-27 VITALS — BP 139/76 | HR 81 | Ht 69.0 in | Wt 209.0 lb

## 2021-07-27 DIAGNOSIS — I10 Essential (primary) hypertension: Secondary | ICD-10-CM | POA: Diagnosis not present

## 2021-07-27 DIAGNOSIS — Z6831 Body mass index (BMI) 31.0-31.9, adult: Secondary | ICD-10-CM

## 2021-07-27 DIAGNOSIS — G459 Transient cerebral ischemic attack, unspecified: Secondary | ICD-10-CM | POA: Diagnosis not present

## 2021-07-27 DIAGNOSIS — E669 Obesity, unspecified: Secondary | ICD-10-CM | POA: Diagnosis not present

## 2021-07-27 DIAGNOSIS — I2510 Atherosclerotic heart disease of native coronary artery without angina pectoris: Secondary | ICD-10-CM

## 2021-07-27 DIAGNOSIS — N1831 Chronic kidney disease, stage 3a: Secondary | ICD-10-CM | POA: Diagnosis not present

## 2021-07-27 NOTE — Assessment & Plan Note (Signed)
Avoid Advil

## 2021-07-27 NOTE — Assessment & Plan Note (Signed)
Stable at the present time.  Patient does not have any chest pain.  Electrocardiogram does not show any acute changes.  Cardiomegaly is noted.

## 2021-07-27 NOTE — Assessment & Plan Note (Signed)

## 2021-07-27 NOTE — Assessment & Plan Note (Signed)

## 2021-07-27 NOTE — Progress Notes (Signed)
Established Patient Office Visit  Subjective:  Patient ID: Benjamin Valentine, male    DOB: January 06, 1946  Age: 76 y.o. MRN: [id number]  CC:  Chief Complaint  Patient presents with   Dizziness    Patient reports dizziness, patient is having difficulty with left arm feeling heavier than the right. Patient reports this has been happening continuously for 1 week.    Dizziness This is a new problem. The current episode started in the past 7 days. The problem has been unchanged. Associated symptoms include neck pain. Pertinent negatives include no abdominal pain, anorexia, arthralgias, change in bowel habit, chest pain, chills, congestion, coughing, diaphoresis, fatigue, headaches, joint swelling, myalgias, nausea, urinary symptoms or vomiting. He has tried nothing for the symptoms.    Francella Solian presents for DISSINESS  Past Medical History:  Diagnosis Date   Arthritis    Chronic HFrEF (heart failure with reduced ejection fraction) ([place])    a. 10/2020 Echo: EF 25-30%, nl RV fxn, mild MR.   CKD (chronic kidney disease), stage III ([place])    Coronary artery disease    a. 2002 s/p CABG x 2: LIMA->LAD, VG->RPDA; b. 11/2020 Cath: LM 20ost, LAD 160m LCX 99p/m, 95m, OM1 30, RCA 100p - fills via collats from LAD. RPL1 fills via collats from LCX. VG->PDA 100, LIMA->LAD ok-->med rx.   Gout    Hyperlipidemia LDL goal <70    Hypertension    Ischemic cardiomyopathy    a. 10/2020 Echo: EF 25-30%.   Osteoarthritis     Past Surgical History:  Procedure Laterality Date   CARDIAC SURGERY     CABG 2002   LAPAROSCOPIC APPENDECTOMY N/A 08/11/2018   Procedure: APPENDECTOMY LAPAROSCOPIC;  Surgeon: PiOlean ReeMD;  Location: [facility];  Service: General;  Laterality: N/A;   RIGHT/LEFT HEART CATH AND CORONARY/GRAFT ANGIOGRAPHY N/A 11/15/2020   Procedure: RIGHT/LEFT HEART CATH AND CORONARY/GRAFT ANGIOGRAPHY;  Surgeon: ArWellington HampshireMD;  Location: [facility];  Service: Cardiovascular;   Laterality: N/A;    History reviewed. No pertinent family history.  Social History   Socioeconomic History   Marital status: Widowed    Spouse name: Not on file   Number of children: Not on file   Years of education: Not on file   Highest education level: Not on file  Occupational History   Not on file  Tobacco Use   Smoking status: Former   Smokeless tobacco: Never  Vaping Use   Vaping Use: Never used  Substance and Sexual Activity   Alcohol use: No   Drug use: No   Sexual activity: Not on file  Other Topics Concern   Not on file  Social History Narrative   Not on file   Social Determinants of Health   Financial Resource Strain: Low Risk  (11/25/2020)   Overall Financial Resource Strain (CARDIA)    Difficulty of Paying Living Expenses: Not very hard  Food Insecurity: No Food Insecurity (11/25/2020)   Hunger Vital Sign    Worried About Running Out of Food in the Last Year: Never true    Ran Out of Food in the Last Year: Never true  Transportation Needs: No Transportation Needs (11/25/2020)   PRAPARE - TrHydrologistMedical): No    Lack of Transportation (Non-Medical): No  Physical Activity: Insufficiently Active (11/25/2020)   Exercise Vital Sign    Days of Exercise per Week: 2 days    Minutes of Exercise per Session: 20 min  Stress: Stress Concern Present (11/25/2020)   [place]    Feeling of Stress : To some extent  Social Connections: Socially Isolated (11/25/2020)   Social Connection and Isolation Panel [NHANES]    Frequency of Communication with Friends and Family: More than three times a week    Frequency of Social Gatherings with Friends and Family: More than three times a week    Attends Religious Services: Never    Marine scientist or Organizations: No    Attends Archivist Meetings: Never    Marital Status: Widowed  Intimate Partner  Violence: Not At Risk (11/25/2020)   Humiliation, Afraid, Rape, and Kick questionnaire    Fear of Current or Ex-Partner: No    Emotionally Abused: No    Physically Abused: No    Sexually Abused: No     Current Outpatient Medications:    allopurinol (ZYLOPRIM) 100 MG tablet, Take 1 tablet (100 mg total) by mouth daily., Disp: 90 tablet, Rfl: 3   aspirin EC 81 MG EC tablet, Take 1 tablet (81 mg total) by mouth daily. Swallow whole., Disp: 30 tablet, Rfl: 11   atorvastatin (LIPITOR) 40 MG tablet, TAKE 1 TABLET BY MOUTH EVERY DAY, Disp: 90 tablet, Rfl: 2   Blood Pressure Monitoring (SPHYGMOMANOMETER) MISC, 1 each by Does not apply route daily., Disp: 1 each, Rfl: 0   carvedilol (COREG) 3.125 MG tablet, TAKE 1 TABLET (3.125 MG TOTAL) BY MOUTH 2 (TWO) TIMES DAILY WITH A MEAL., Disp: 180 tablet, Rfl: 2   dapagliflozin propanediol (FARXIGA) 5 MG TABS tablet, Take 2 tablets (10 mg total) by mouth daily. (Patient taking differently: Take 5 mg by mouth daily.), Disp: 30 tablet, Rfl: 5   furosemide (LASIX) 20 MG tablet, Take 1 tablet (20 mg total) by mouth daily as needed (As needed for weight gain of 2 pounds overnight)., Disp: 90 tablet, Rfl: 3   hydrALAZINE (APRESOLINE) 50 MG tablet, TAKE 1 TABLET BY MOUTH THREE TIMES A DAY, Disp: 270 tablet, Rfl: 2   isosorbide mononitrate (IMDUR) 30 MG 24 hr tablet, Take 1 tablet (30 mg total) by mouth daily., Disp: 90 tablet, Rfl: 3   lidocaine (LIDODERM) 5 %, Place 1 patch onto the skin as needed., Disp: , Rfl:    naproxen sodium (ANAPROX) 550 MG tablet, Take 550 mg by mouth 2 (two) times daily with a meal., Disp: , Rfl:    spironolactone (ALDACTONE) 25 MG tablet, TAKE 1 TABLET (25 MG TOTAL) BY MOUTH DAILY., Disp: 90 tablet, Rfl: 1   No Known Allergies  ROS Review of Systems  Constitutional:  Negative for chills, diaphoresis and fatigue.  HENT:  Negative for congestion.   Respiratory:  Negative for cough.   Cardiovascular:  Negative for chest pain.   Gastrointestinal:  Negative for abdominal pain, anorexia, change in bowel habit, nausea and vomiting.  Musculoskeletal:  Positive for neck pain. Negative for arthralgias, joint swelling and myalgias.  Neurological:  Positive for dizziness. Negative for headaches.      Objective:    Physical Exam Vitals reviewed.  Constitutional:      Appearance: Normal appearance.  HENT:     Head: No abrasion, masses, right periorbital erythema or left periorbital erythema.     Jaw: No trismus.     Comments: Both  ears are ok , neck supple    Mouth/Throat:     Mouth: Mucous membranes are moist.  Eyes:     Pupils: Pupils  are equal, round, and reactive to light.  Neck:     Vascular: No carotid bruit.  Cardiovascular:     Rate and Rhythm: Normal rate and regular rhythm.     Pulses: Normal pulses.     Heart sounds: Normal heart sounds.  Pulmonary:     Effort: Pulmonary effort is normal.     Breath sounds: Normal breath sounds.  Abdominal:     General: Bowel sounds are normal.     Palpations: Abdomen is soft. There is no hepatomegaly, splenomegaly or mass.     Tenderness: There is no abdominal tenderness.     Hernia: No hernia is present.  Musculoskeletal:     Cervical back: Neck supple.     Right lower leg: No edema.     Left lower leg: No edema.  Skin:    Findings: No rash.  Neurological:     Mental Status: He is alert and oriented to person, place, and time.     Motor: No weakness.  Psychiatric:        Mood and Affect: Mood normal.        Behavior: Behavior normal.     BP 139/76   Pulse 81   Ht 5' 9"  (1.753 m)   Wt 209 lb (94.8 kg)   BMI 30.86 kg/m  Wt Readings from Last 3 Encounters:  07/27/21 209 lb (94.8 kg)  06/29/21 209 lb 9.6 oz (95.1 kg)  06/02/21 209 lb 6 oz (95 kg)     Health Maintenance Due  Topic Date Due   COVID-19 Vaccine (1) Never done   Pneumonia Vaccine 87+ Years old (1 - PCV) Never done   Hepatitis C Screening  Never done   TETANUS/TDAP  Never done    Zoster Vaccines- Shingrix (1 of 2) Never done    There are no preventive care reminders to display for this patient.  No results found for: "TSH" Lab Results  Component Value Date   WBC 8.3 06/01/2021   HGB 12.7 (L) 06/01/2021   HCT 41.4 06/01/2021   MCV 86.4 06/01/2021   PLT 363 06/01/2021   Lab Results  Component Value Date   NA 135 06/01/2021   K 3.7 06/01/2021   CO2 21 (L) 06/01/2021   GLUCOSE 200 (H) 06/01/2021   BUN 15 06/01/2021   CREATININE 1.27 (H) 06/01/2021   BILITOT 0.6 06/01/2021   ALKPHOS 56 06/01/2021   AST 18 06/01/2021   ALT 11 06/01/2021   PROT 7.2 06/01/2021   ALBUMIN 3.5 06/01/2021   CALCIUM 8.3 (L) 06/01/2021   ANIONGAP 6 06/01/2021   EGFR 55 (L) 12/31/2020   Lab Results  Component Value Date   CHOL 110 11/12/2020   Lab Results  Component Value Date   HDL 22 (L) 11/12/2020   Lab Results  Component Value Date   LDLCALC 70 11/12/2020   Lab Results  Component Value Date   TRIG 91 11/12/2020   Lab Results  Component Value Date   CHOLHDL 5.0 11/12/2020   Lab Results  Component Value Date   HGBA1C 6.0 (H) 11/25/2020      Assessment & Plan:   Problem List Items Addressed This Visit       Cardiovascular and Mediastinum   Essential hypertension    The following hypertensive lifestyle modification were recommended and discussed:  1. Limiting alcohol intake to less than 1 oz/day of ethanol:(24 oz of beer or 8 oz of wine or 2 oz of 100-proof whiskey). 2. Take baby  ASA 81 mg daily. 3. Importance of regular aerobic exercise and losing weight. 4. Reduce dietary saturated fat and cholesterol intake for overall cardiovascular health. 5. Maintaining adequate dietary potassium, calcium, and magnesium intake. 6. Regular monitoring of the blood pressure. 7. Reduce sodium intake to less than 100 mmol/day (less than 2.3 gm of sodium or less than 6 gm of sodium choride)       Coronary artery disease involving native coronary artery of native  heart without angina pectoris - Primary    Stable at the present time.  Patient does not have any chest pain.  Electrocardiogram does not show any acute changes.  Cardiomegaly is noted.      TIA (transient ischemic attack)    Patient has a CT scan of the head MRI of the brain and CT angio of the neck.  No source of blood clot noted.  He is in sinus rhythm.  I told him to take 1 baby aspirin a day.  I think most of his symptoms is coming from the arthritis of the back of the neck        Genitourinary   Stage 3a chronic kidney disease (HCC)    Avoid Advil        Other   Class 1 obesity without serious comorbidity with body mass index (BMI) of 31.0 to 31.9 in adult    - I encouraged the patient to lose weight.  - I educated them on making healthy dietary choices including eating more fruits and vegetables and less fried foods. - I encouraged the patient to exercise more, and educated on the benefits of exercise including weight loss, diabetes prevention, and hypertension prevention.   Dietary counseling with a registered dietician  Referral to a weight management support group (e.g. Weight Watchers, Overeaters Anonymous)  If your BMI is greater than 29 or you have gained more than 15 pounds you should work on weight loss.  Attend a healthy cooking class      Patient will be referred to orthopedic surgeon and urology, old records were reviewed on this visit, he was advised to take baby aspirin 1 a day.  Report of the electrocardiogram.  Normal sinus rhythm left ventricular hypertrophy nonspecific ST-T abnormalities noted.  No orders of the defined types were placed in this encounter.   Follow-up: No follow-ups on file.    Cletis Athens, MD

## 2021-07-27 NOTE — Assessment & Plan Note (Signed)
Patient has a CT scan of the head MRI of the brain and CT angio of the neck.  No source of blood clot noted.  He is in sinus rhythm.  I told him to take 1 baby aspirin a day.  I think most of his symptoms is coming from the arthritis of the back of the neck

## 2021-08-10 ENCOUNTER — Ambulatory Visit: Payer: Medicare HMO | Admitting: Internal Medicine

## 2021-08-13 ENCOUNTER — Encounter: Payer: Self-pay | Admitting: Emergency Medicine

## 2021-08-13 ENCOUNTER — Emergency Department: Payer: Medicare HMO

## 2021-08-13 ENCOUNTER — Emergency Department
Admission: EM | Admit: 2021-08-13 | Discharge: 2021-08-13 | Disposition: A | Discharge status: Home / Self Care | Payer: Medicare HMO | Attending: Emergency Medicine | Admitting: Emergency Medicine

## 2021-08-13 ENCOUNTER — Other Ambulatory Visit: Payer: Self-pay

## 2021-08-13 DIAGNOSIS — M10032 Idiopathic gout, left wrist: Secondary | ICD-10-CM | POA: Diagnosis not present

## 2021-08-13 DIAGNOSIS — I509 Heart failure, unspecified: Secondary | ICD-10-CM | POA: Insufficient documentation

## 2021-08-13 DIAGNOSIS — I2510 Atherosclerotic heart disease of native coronary artery without angina pectoris: Secondary | ICD-10-CM | POA: Insufficient documentation

## 2021-08-13 DIAGNOSIS — I130 Hypertensive heart and chronic kidney disease with heart failure and stage 1 through stage 4 chronic kidney disease, or unspecified chronic kidney disease: Secondary | ICD-10-CM | POA: Diagnosis not present

## 2021-08-13 DIAGNOSIS — N189 Chronic kidney disease, unspecified: Secondary | ICD-10-CM | POA: Diagnosis not present

## 2021-08-13 DIAGNOSIS — M25532 Pain in left wrist: Secondary | ICD-10-CM | POA: Diagnosis not present

## 2021-08-13 DIAGNOSIS — M1A3320 Chronic gout due to renal impairment, left wrist, without tophus (tophi): Secondary | ICD-10-CM | POA: Insufficient documentation

## 2021-08-13 DIAGNOSIS — M109 Gout, unspecified: Secondary | ICD-10-CM | POA: Diagnosis not present

## 2021-08-13 LAB — BASIC METABOLIC PANEL
Anion gap: 11 (ref 5–15)
BUN: 22 mg/dL (ref 8–23)
CO2: 19 mmol/L — ABNORMAL LOW (ref 22–32)
Calcium: 9.2 mg/dL (ref 8.9–10.3)
Chloride: 107 mmol/L (ref 98–111)
Creatinine, Ser: 1.37 mg/dL — ABNORMAL HIGH (ref 0.61–1.24)
GFR, Estimated: 53 mL/min — ABNORMAL LOW (ref >60)
Glucose, Bld: 100 mg/dL — ABNORMAL HIGH (ref 70–99)
Potassium: 4.2 mmol/L (ref 3.5–5.1)
Sodium: 137 mmol/L (ref 135–145)

## 2021-08-13 LAB — URIC ACID: Uric Acid, Serum: 7.6 mg/dL (ref 3.7–8.6)

## 2021-08-13 MED ORDER — COLCHICINE 0.6 MG PO TABS: ORAL_TABLET | ORAL | 0 refills | Status: DC

## 2021-08-13 MED ORDER — DEXAMETHASONE SODIUM PHOSPHATE 10 MG/ML IJ SOLN
10.0000 mg | Freq: Once | INTRAMUSCULAR | Status: AC
  Administered 2021-08-13: 10 mg via INTRAMUSCULAR
  Filled 2021-08-13: qty 1

## 2021-08-13 NOTE — ED Notes (Signed)
Pt to ED for L wrist pain since yesterday, hx of gout, believes this to be a gout flare.

## 2021-08-13 NOTE — Discharge Instructions (Signed)
You were seen today for gout of your left wrist.  Your x-ray showed soft tissue swelling but no acute findings.  You received a steroid injection to help with your gout flare.  I am putting you on colchicine to help reduce this gout flare.  Please follow-up with your PCP if symptoms persist or worsen.

## 2021-08-13 NOTE — ED Provider Notes (Signed)
[facility] Provider Note    Event Date/Time   First MD Initiated Contact with Patient 08/13/21 1010     (approximate)   History   Wrist Pain   HPI  Benjamin Valentine is a 76 y.o. male with a history of HTN, CKD, HLD with CAD status post TIA, CHF, CKD and gout presents to the ER today with complaint of left wrist pain and swelling.  He reports this started yesterday morning.  He describes the pain as throbbing.  The pain does not radiate.  He denies numbness or tingling but is having weakness in the LUE. He denies any recent injury to the area.  He has a history of gout and reports this feels the same.  He is taking Allopurinol as prescribed.  He has not tried anything additionally OTC.      Physical Exam   Triage Vital Signs: ED Triage Vitals  Enc Vitals Group     BP 08/13/21 0954 (S) (!) 181/92     Pulse Rate 08/13/21 0954 79     Resp 08/13/21 0954 16     Temp 08/13/21 0954 98 F (36.7 C)     Temp Source 08/13/21 0954 Oral     SpO2 08/13/21 0954 98 %     Weight 08/13/21 0952 200 lb (90.7 kg)     Height 08/13/21 0952 5\' 9"  (1.753 m)     Head Circumference --      Peak Flow --      Pain Score 08/13/21 0952 8     Pain Loc --      Pain Edu? --      Excl. in GC? --     Most recent vital signs: Vitals:   08/13/21 0954  BP: (S) (!) 181/92  Pulse: 79  Resp: 16  Temp: 98 F (36.7 C)  SpO2: 98%     General: Awake, appears in pain but in NAD. CV:  RRR, radial pulse 2+ on the left.  Cap refill <3 seconds. Resp:  Normal effort.  CTA bilaterally. MSK:  Decreased flexion, extension and rotation of the left wrist secondary to pain and swelling.  Pain with palpation over the carpals.  1+ swelling over the left carpals.  He is able to flex and extend his fingers.  Handgrips unequal, R >L. Skin:  Redness, warmth and pain with palpation noted of the left carpals.   ED Results / Procedures / Treatments   Labs    RADIOLOGY  Imaging Orders          DG Wrist Complete Left     IMPRESSION:  Mild soft tissue swelling and minimal erosion involving the ulnar styloid process. Extensive peripheral vascular calcification.   MEDICATIONS ORDERED IN ED: Medications  dexamethasone (DECADRON) injection 10 mg (10 mg Intramuscular Given 08/13/21 1038)     IMPRESSION / MDM / ASSESSMENT AND PLAN / ED COURSE  I reviewed the triage vital signs and the nursing notes.  Pain, Swelling, Redness of the Left Wrist:  Differential diagnosis includes, but is not limited to, gout of the left wrist, inflammatory arthritis, osteoarthritis  Patient's presentation is most consistent with exacerbation of chronic illness.  BMET shows a creatinine of 1.37 with a GFR 53 which is stable per his baseline.  No electrolyte abnormality. Uric acid 7.6% X-ray of the left wrist shows soft tissue swelling per my interpretation but no acute fracture or dislocation Decadron 10 mg IM x1 RX for Colchcine 1.2 mg PO  x 1, followed by 0.6 mg 1 hour later. 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    Lorre Munroe, NP 08/13/21 1103    Merwyn Katos, MD 08/13/21 1438

## 2021-08-13 NOTE — ED Triage Notes (Signed)
Pt via POV from home. Pt c/o L wrist swelling that started yesterday morning. Pt has a hx of gout. Pt states he thinks it is that, denies any injury. Pt is A&Ox4 and NAD

## 2021-08-17 ENCOUNTER — Other Ambulatory Visit: Payer: Self-pay

## 2021-08-29 ENCOUNTER — Ambulatory Visit: Payer: Medicare HMO | Admitting: Internal Medicine

## 2021-09-01 ENCOUNTER — Encounter: Payer: Self-pay | Admitting: Family

## 2021-09-01 ENCOUNTER — Ambulatory Visit: Payer: Medicare HMO | Attending: Family | Admitting: Family

## 2021-09-01 VITALS — BP 132/76 | HR 81 | Resp 20 | Ht 63.0 in | Wt 207.0 lb

## 2021-09-01 DIAGNOSIS — Z87891 Personal history of nicotine dependence: Secondary | ICD-10-CM | POA: Insufficient documentation

## 2021-09-01 DIAGNOSIS — M109 Gout, unspecified: Secondary | ICD-10-CM | POA: Diagnosis not present

## 2021-09-01 DIAGNOSIS — I10 Essential (primary) hypertension: Secondary | ICD-10-CM | POA: Diagnosis not present

## 2021-09-01 DIAGNOSIS — M1A0320 Idiopathic chronic gout, left wrist, without tophus (tophi): Secondary | ICD-10-CM | POA: Diagnosis not present

## 2021-09-01 DIAGNOSIS — M1990 Unspecified osteoarthritis, unspecified site: Secondary | ICD-10-CM | POA: Insufficient documentation

## 2021-09-01 DIAGNOSIS — Z7984 Long term (current) use of oral hypoglycemic drugs: Secondary | ICD-10-CM | POA: Insufficient documentation

## 2021-09-01 DIAGNOSIS — I2510 Atherosclerotic heart disease of native coronary artery without angina pectoris: Secondary | ICD-10-CM | POA: Diagnosis not present

## 2021-09-01 DIAGNOSIS — I130 Hypertensive heart and chronic kidney disease with heart failure and stage 1 through stage 4 chronic kidney disease, or unspecified chronic kidney disease: Secondary | ICD-10-CM | POA: Insufficient documentation

## 2021-09-01 DIAGNOSIS — N183 Chronic kidney disease, stage 3 unspecified: Secondary | ICD-10-CM | POA: Insufficient documentation

## 2021-09-01 DIAGNOSIS — Z951 Presence of aortocoronary bypass graft: Secondary | ICD-10-CM | POA: Insufficient documentation

## 2021-09-01 DIAGNOSIS — I5022 Chronic systolic (congestive) heart failure: Secondary | ICD-10-CM | POA: Diagnosis not present

## 2021-09-01 DIAGNOSIS — Z79899 Other long term (current) drug therapy: Secondary | ICD-10-CM | POA: Insufficient documentation

## 2021-09-01 DIAGNOSIS — I272 Pulmonary hypertension, unspecified: Secondary | ICD-10-CM | POA: Insufficient documentation

## 2021-09-01 NOTE — Progress Notes (Signed)
Patient ID: Benjamin Valentine, male    DOB: [birth year], 76 y.o.   MRN: [id number]   Benjamin Valentine is Valentine 76 y/o male with Valentine history of CAD (CABG), HTN, arthritis, gout, previous tobacco use and chronic heart failure.   Echo report from 11/11/20 reviewed and showed an EF of 25-30% along with mild Benjamin.   RHC/LHC done 11/15/20 and showed: Ost LM lesion is 20% stenosed.   Prox Cx to Mid Cx lesion is 99% stenosed.   Mid Cx to Dist Cx lesion is 99% stenosed.   1st Mrg lesion is 30% stenosed.   Mid LAD lesion is 100% stenosed.   Prox RCA lesion is 100% stenosed.   Origin lesion is 100% stenosed.   SVG.   LIMA graft was visualized by angiography and is normal in caliber.   The graft exhibits no disease. 1.  Significant underlying three-vessel coronary artery disease with occluded mid LAD, occluded proximal right coronary artery and subtotal occlusion of the mid left circumflex after the origin of Valentine large high OM1 which supplies the majority of the left circumflex distribution. Patent LIMA to LAD with occluded SVG to RCA and SVG to OM. The RCA gets left-to-right collaterals. 2.  Left ventricular angiography was not performed due to chronic kidney disease. 3.  Right heart catheterization showed high normal filling pressures, moderate pulmonary hypertension and moderately to severely reduced cardiac output.  Was in the ED 08/13/21 due to left wrist gout where he was evaluated and released. Was in the ED 06/01/21 due to dizziness, headache and chest pain. Accidentally took Valentine double dose of his medications today. Symptoms improved and he was released.   He presents today for Valentine follow-up visit with Valentine chief complaint of minimal fatigue upon moderate exertion. Describes this as chronic in nature. He has associated headaches and left wrist pain/ swelling. He denies any dizziness, cough, shortness of breath, chest pain, pedal edema, palpitations, abdominal distention, difficulty sleeping or weight gain.   Has scales but  hasn't been consistently weighing daily. Does not have any furosemide or allopurinol and has been out of them for "awhile". Has spironolactone bottle but friend says he hasn't been taking it because he thought he was supposed to stop it.   Biggest complaint today is of severe pain/swelling in his left wrist which was diagnosed with gout. Said he originally had it in his right thumb and now it's in his wrist. Took colchicine without much relief and has been taking naproxsyn for the pain.   Past Medical History:  Diagnosis Date   Arthritis    Chronic HFrEF (heart failure with reduced ejection fraction) (HCC)    Valentine. 10/2020 Echo: EF 25-30%, nl RV fxn, mild Benjamin.   CKD (chronic kidney disease), stage III (HCC)    Coronary artery disease    Valentine. 2002 s/p CABG x 2: LIMA->LAD, VG->RPDA; b. 11/2020 Cath: LM 20ost, LAD 143m, LCX 99p/m, 35m/d, OM1 30, RCA 100p - fills via collats from LAD. RPL1 fills via collats from LCX. VG->PDA 100, LIMA->LAD ok-->med rx.   Gout    Hyperlipidemia LDL goal <70    Hypertension    Ischemic cardiomyopathy    Valentine. 10/2020 Echo: EF 25-30%.   Osteoarthritis    Past Surgical History:  Procedure Laterality Date   CARDIAC SURGERY     CABG 2002   LAPAROSCOPIC APPENDECTOMY N/Valentine 08/11/2018   Procedure: APPENDECTOMY LAPAROSCOPIC;  Surgeon: Benjamin Dodge, Valentine;  Location: [facility] ORS;  Service: General;  Laterality:  N/Valentine;   RIGHT/LEFT HEART CATH AND CORONARY/GRAFT ANGIOGRAPHY N/Valentine 11/15/2020   Procedure: RIGHT/LEFT HEART CATH AND CORONARY/GRAFT ANGIOGRAPHY;  Surgeon: Benjamin Ouch, Valentine;  Location: [facility] INVASIVE CV LAB;  Service: Cardiovascular;  Laterality: N/Valentine;   No family history on file. Social History   Tobacco Use   Smoking status: Former   Smokeless tobacco: Never  Substance Use Topics   Alcohol use: No   No Known Allergies  Prior to Admission medications   Medication Sig Start Date End Date Taking? Authorizing Provider  aspirin EC 81 MG EC tablet Take 1 tablet (81 mg total)  by mouth daily. Swallow whole. 11/27/20  Yes Benjamin Chamber, Valentine  atorvastatin (LIPITOR) 40 MG tablet TAKE 1 TABLET BY MOUTH EVERY DAY 05/23/21  Yes Benjamin Valentine  Blood Pressure Monitoring (SPHYGMOMANOMETER) MISC 1 each by Does not apply route daily. 05/01/21  Yes Benjamin Valentine  carvedilol (COREG) 3.125 MG tablet TAKE 1 TABLET (3.125 MG TOTAL) BY MOUTH 2 (TWO) TIMES DAILY WITH Valentine MEAL. 05/30/21  Yes Benjamin Valentine  dapagliflozin propanediol (FARXIGA) 5 MG TABS tablet Take 2 tablets (10 mg total) by mouth daily. Patient taking differently: Take 5 mg by mouth daily. 03/22/21  Yes Benjamin Valentine, Benjamin Fermo A, [name]  hydrALAZINE (APRESOLINE) 50 MG tablet TAKE 1 TABLET BY MOUTH THREE TIMES Valentine DAY 05/30/21  Yes Benjamin Valentine  isosorbide mononitrate (IMDUR) 30 MG 24 hr tablet Take 1 tablet (30 mg total) by mouth daily. 06/29/21 09/27/21 Yes Benjamin Valentine  allopurinol (ZYLOPRIM) 100 MG tablet Take 1 tablet (100 mg total) by mouth daily. Patient not taking: Reported on 09/01/2021 06/29/21   Benjamin Downs, Valentine  colchicine 0.6 MG tablet Take 2 tabs p.o. x1 then take 1 tab p.o. 1 hour later for gout attack. Patient not taking: Reported on 09/01/2021 08/13/21   Benjamin Munroe, Valentine  furosemide (LASIX) 20 MG tablet Take 1 tablet (20 mg total) by mouth daily as needed (As needed for weight gain of 2 pounds overnight). Patient not taking: Reported on 09/01/2021 06/29/21 09/27/21  Benjamin Downs, Valentine  lidocaine (LIDODERM) 5 % Place 1 patch onto the skin as needed. Patient not taking: Reported on 09/01/2021 03/01/21   Provider, Historical, Valentine  naproxen sodium (ANAPROX) 550 MG tablet Take 550 mg by mouth 2 (two) times daily with Valentine meal. Patient not taking: Reported on 09/01/2021 03/01/21   Provider, Historical, Valentine  spironolactone (ALDACTONE) 25 MG tablet TAKE 1 TABLET (25 MG TOTAL) BY MOUTH DAILY. Patient not taking: Reported on 09/01/2021 05/02/21   Benjamin Downs, Valentine    Review of Systems  Constitutional:  Positive for fatigue.  Negative for appetite change.  HENT:  Negative for congestion, postnasal drip and sore throat.   Eyes: Negative.   Respiratory:  Negative for cough, chest tightness and shortness of breath.   Cardiovascular:  Negative for chest pain, palpitations and leg swelling.  Gastrointestinal:  Negative for abdominal distention and abdominal pain.  Endocrine: Negative.   Musculoskeletal:  Positive for arthralgias (left arm/wrist) and joint swelling (left wrist). Negative for neck pain.  Skin: Negative.   Allergic/Immunologic: Negative.   Neurological:  Positive for headaches. Negative for dizziness and light-headedness.  Hematological:  Negative for adenopathy. Does not bruise/bleed easily.  Psychiatric/Behavioral:  Negative for dysphoric mood and sleep disturbance (sleeping on 1 pillows). The patient is not nervous/anxious.    Vitals:   09/01/21 1146  BP: 132/76  Pulse: 81  Resp: 20  SpO2: 100%  Weight:  207 lb (93.9 kg)  Height: 5\' 3"  (1.6 m)   Wt Readings from Last 3 Encounters:  09/01/21 207 lb (93.9 kg)  08/13/21 200 lb (90.7 kg)  07/27/21 209 lb (94.8 kg)   Lab Results  Component Value Date   CREATININE 1.37 (H) 08/13/2021   CREATININE 1.27 (H) 06/01/2021   CREATININE 1.35 (H) 05/05/2021   Physical Exam Vitals and nursing note reviewed. Exam conducted with Valentine chaperone present ("friend who is like Valentine daughter").  Constitutional:      General: He is not in acute distress.    Appearance: Normal appearance.  HENT:     Head: Normocephalic and atraumatic.  Cardiovascular:     Rate and Rhythm: Normal rate and regular rhythm.  Pulmonary:     Effort: Pulmonary effort is normal. No respiratory distress.     Breath sounds: No wheezing or rales.  Abdominal:     General: Abdomen is flat. There is no distension.     Palpations: Abdomen is soft.  Musculoskeletal:        General: Swelling (left wrist) and tenderness (left wrist) present.     Cervical back: Normal range of motion and neck  supple. No rigidity.     Right lower leg: No edema.     Left lower leg: No edema.  Skin:    General: Skin is warm and dry.  Neurological:     General: No focal deficit present.     Mental Status: He is alert and oriented to person, place, and time.  Psychiatric:        Mood and Affect: Mood normal.        Behavior: Behavior normal.        Thought Content: Thought content normal.     Assessment & Plan:  1: Chronic heart failure with reduced ejection fraction- - NYHA class II - euvolemic today - not weighing daily but does have scales; encouraged to resume weighing daily and to call for an overnight weight gain of > 2 pounds or Valentine weekly weight gain of > 5 pounds - weight down 2 pounds from last visit here 3 months ago - not adding salt and friend ("daughter") does most of the cooking and doesn't cook with salt either - on GDMT of carvedilol and farxiga  - for some reason, he had recently stopped spironolactone so instructed him to resume taking this - consider adding entresto in the future if renal function continues to look good - BNP 11/11/20 was 616.5  2: HTN with CKD- - BP looks good (132/76) - saw PCP ([name]) 07/27/21 - BMP 08/13/21 reviewed and showed sodium 137, potassium 4.2, creatinine 1.37 and GFR 53 - will check labs at next visit if not done elsewhere  3: CAD- - CABG back in 2002 - saw cardiology Sharolyn Douglas) 12/31/20   4: Left wrist gout- - finished taking colchicine - has been out of allopurinol for "awhile"; according to [facility], he should have active prescription at his pharmacy and this information was printed out for him to take to pharmacy - reviewed potential dietary things that can trigger gout - emphasized that he get an appointment scheduled with his PCP to follow-up about this   Medication bottles reviewed.   Return in 3 months, sooner if needed.

## 2021-09-01 NOTE — Patient Instructions (Addendum)
Resume weighing daily and call for an overnight weight gain of 3 pounds or more or a weekly weight gain of more than 5 pounds.   If you have voicemail, please make sure your mailbox is cleaned out so that we may leave a message and please make sure to listen to any voicemails.    Call Dr. Fredna Dow office to schedule f/u appointment

## 2021-09-27 ENCOUNTER — Encounter: Payer: Self-pay | Admitting: Internal Medicine

## 2021-09-27 ENCOUNTER — Ambulatory Visit (INDEPENDENT_AMBULATORY_CARE_PROVIDER_SITE_OTHER): Payer: Medicare HMO | Admitting: Internal Medicine

## 2021-09-27 ENCOUNTER — Other Ambulatory Visit: Payer: Self-pay | Admitting: Nurse Practitioner

## 2021-09-27 VITALS — BP 158/65 | HR 71 | Ht 63.0 in | Wt 209.6 lb

## 2021-09-27 DIAGNOSIS — I5021 Acute systolic (congestive) heart failure: Secondary | ICD-10-CM | POA: Diagnosis not present

## 2021-09-27 DIAGNOSIS — M1A3790 Chronic gout due to renal impairment, unspecified ankle and foot, without tophus (tophi): Secondary | ICD-10-CM | POA: Diagnosis not present

## 2021-09-27 DIAGNOSIS — N1831 Chronic kidney disease, stage 3a: Secondary | ICD-10-CM | POA: Diagnosis not present

## 2021-09-27 DIAGNOSIS — I10 Essential (primary) hypertension: Secondary | ICD-10-CM

## 2021-09-27 NOTE — Assessment & Plan Note (Signed)
stable °

## 2021-09-27 NOTE — Telephone Encounter (Signed)
Refill request

## 2021-09-27 NOTE — Progress Notes (Addendum)
Established Patient Office Visit  Subjective:  Patient ID: Benjamin Valentine, male    DOB: [birth year]  Age: 76 y.o. MRN: [id number]  CC:  Chief Complaint  Patient presents with   hand swelling    Patient has left hand pain and swelling x 1 week     HPI  Benjamin Valentine presents for lt hand pain  Past Medical History:  Diagnosis Date   Arthritis    Chronic HFrEF (heart failure with reduced ejection fraction) ([place])    a. 10/2020 Echo: EF 25-30%, nl RV fxn, mild MR.   CKD (chronic kidney disease), stage III ([place])    Coronary artery disease    a. 2002 s/p CABG x 2: LIMA->LAD, VG->RPDA; b. 11/2020 Cath: LM 20ost, LAD 142m LCX 99p/m, 958m, OM1 30, RCA 100p - fills via collats from LAD. RPL1 fills via collats from LCX. VG->PDA 100, LIMA->LAD ok-->med rx.   Gout    Hyperlipidemia LDL goal <70    Hypertension    Ischemic cardiomyopathy    a. 10/2020 Echo: EF 25-30%.   Osteoarthritis     Past Surgical History:  Procedure Laterality Date   CARDIAC SURGERY     CABG 2002   LAPAROSCOPIC APPENDECTOMY N/A 08/11/2018   Procedure: APPENDECTOMY LAPAROSCOPIC;  Surgeon: PiOlean ReeMD;  Location: [facility] ORS;  Service: General;  Laterality: N/A;   RIGHT/LEFT HEART CATH AND CORONARY/GRAFT ANGIOGRAPHY N/A 11/15/2020   Procedure: RIGHT/LEFT HEART CATH AND CORONARY/GRAFT ANGIOGRAPHY;  Surgeon: ArWellington HampshireMD;  [facility];  Service: Cardiovascular;  Laterality: N/A;    History reviewed. No pertinent family history.  Social History   Socioeconomic History   Marital status: Widowed    Spouse name: Not on file   Number of children: Not on file   Years of education: Not on file   Highest education level: Not on file  Occupational History   Not on file  Tobacco Use   Smoking status: Former   Smokeless tobacco: Never  Vaping Use   Vaping Use: Never used  Substance and Sexual Activity   Alcohol use: No   Drug use: No   Sexual activity: Not on file  Other Topics  Concern   Not on file  Social History Narrative   Not on file   Social Determinants of Health   Financial Resource Strain: Low Risk  (11/25/2020)   Overall Financial Resource Strain (CARDIA)    Difficulty of Paying Living Expenses: Not very hard  Food Insecurity: No Food Insecurity (11/25/2020)   Hunger Vital Sign    Worried About Running Out of Food in the Last Year: Never true    Ran Out of Food in the Last Year: Never true  Transportation Needs: No Transportation Needs (11/25/2020)   PRAPARE - TrHydrologistMedical): No    Lack of Transportation (Non-Medical): No  Physical Activity: Insufficiently Active (11/25/2020)   Exercise Vital Sign    Days of Exercise per Week: 2 days    Minutes of Exercise per Session: 20 min  Stress: Stress Concern Present (11/25/2020)   [place]  Feeling of Stress : To some extent  Social Connections: Socially Isolated (11/25/2020)   Social Connection and Isolation Panel [NHANES]    Frequency of Communication with Friends and Family: More than three times a week    Frequency of Social Gatherings with Friends and Family: More than three times a  week    Attends Religious Services: Never    Active Member of Clubs or Organizations: No    Attends Archivist Meetings: Never    Marital Status: Widowed  Intimate Partner Violence: Not At Risk (11/25/2020)   Humiliation, Afraid, Rape, and Kick questionnaire    Fear of Current or Ex-Partner: No    Emotionally Abused: No    Physically Abused: No    Sexually Abused: No     Current Outpatient Medications:    allopurinol (ZYLOPRIM) 100 MG tablet, Take 1 tablet (100 mg total) by mouth daily., Disp: 90 tablet, Rfl: 3   aspirin EC 81 MG EC tablet, Take 1 tablet (81 mg total) by mouth daily. Swallow whole., Disp: 30 tablet, Rfl: 11   atorvastatin (LIPITOR) 40 MG tablet, TAKE 1 TABLET BY MOUTH EVERY DAY,  Disp: 90 tablet, Rfl: 2   Blood Pressure Monitoring (SPHYGMOMANOMETER) MISC, 1 each by Does not apply route daily., Disp: 1 each, Rfl: 0   carvedilol (COREG) 3.125 MG tablet, TAKE 1 TABLET (3.125 MG TOTAL) BY MOUTH 2 (TWO) TIMES DAILY WITH A MEAL., Disp: 180 tablet, Rfl: 2   colchicine 0.6 MG tablet, Take 2 tabs p.o. x1 then take 1 tab p.o. 1 hour later for gout attack., Disp: 10 tablet, Rfl: 0   FARXIGA 5 MG TABS tablet, TAKE 1 TABLET (5 MG TOTAL) BY MOUTH DAILY., Disp: 30 tablet, Rfl: 6   furosemide (LASIX) 20 MG tablet, Take 1 tablet (20 mg total) by mouth daily as needed (As needed for weight gain of 2 pounds overnight)., Disp: 90 tablet, Rfl: 3   hydrALAZINE (APRESOLINE) 50 MG tablet, TAKE 1 TABLET BY MOUTH THREE TIMES A DAY, Disp: 270 tablet, Rfl: 2   isosorbide mononitrate (IMDUR) 30 MG 24 hr tablet, Take 1 tablet (30 mg total) by mouth daily., Disp: 90 tablet, Rfl: 3   naproxen sodium (ANAPROX) 550 MG tablet, Take 550 mg by mouth 2 (two) times daily with a meal., Disp: , Rfl:    spironolactone (ALDACTONE) 25 MG tablet, TAKE 1 TABLET (25 MG TOTAL) BY MOUTH DAILY., Disp: 90 tablet, Rfl: 1   No Known Allergies  ROS Review of Systems  Constitutional: Negative.   HENT: Negative.    Eyes: Negative.   Respiratory: Negative.    Cardiovascular: Negative.   Gastrointestinal: Negative.   Endocrine: Negative.   Genitourinary: Negative.   Musculoskeletal:  Positive for joint swelling (lt wrist swelling).  Skin: Negative.   Allergic/Immunologic: Negative.   Neurological: Negative.   Hematological: Negative.   Psychiatric/Behavioral: Negative.    All other systems reviewed and are negative.     Objective:    Physical Exam Vitals reviewed.  Constitutional:      Appearance: Normal appearance.  HENT:     Mouth/Throat:     Mouth: Mucous membranes are moist.  Eyes:     Pupils: Pupils are equal, round, and reactive to light.  Neck:     Vascular: No carotid bruit.  Cardiovascular:      Rate and Rhythm: Normal rate and regular rhythm.     Pulses: Normal pulses.     Heart sounds: Normal heart sounds.  Pulmonary:     Effort: Pulmonary effort is normal.     Breath sounds: Normal breath sounds.  Abdominal:     General: Bowel sounds are normal.     Palpations: Abdomen is soft. There is no hepatomegaly, splenomegaly or mass.     Tenderness: There is no abdominal tenderness.  Hernia: No hernia is present.  Musculoskeletal:     Cervical back: Neck supple.     Right lower leg: No edema.     Left lower leg: No edema.  Skin:    Findings: No rash.  Neurological:     Mental Status: He is alert and oriented to person, place, and time.     Motor: No weakness.  Psychiatric:        Mood and Affect: Mood normal.        Behavior: Behavior normal.     BP (!) 158/65   Pulse 71   Ht 5' 3"  (1.6 m)   Wt 209 lb 9.6 oz (95.1 kg)   BMI 37.13 kg/m  Wt Readings from Last 3 Encounters:  09/27/21 209 lb 9.6 oz (95.1 kg)  09/01/21 207 lb (93.9 kg)  08/13/21 200 lb (90.7 kg)     Health Maintenance Due  Topic Date Due   COVID-19 Vaccine (1) Never done   Pneumonia Vaccine 39+ Years old (1 - PCV) Never done   Hepatitis C Screening  Never done   TETANUS/TDAP  Never done   Zoster Vaccines- Shingrix (1 of 2) Never done   INFLUENZA VACCINE  09/13/2021    There are no preventive care reminders to display for this patient.  No results found for: "TSH" Lab Results  Component Value Date   WBC 8.3 06/01/2021   HGB 12.7 (L) 06/01/2021   HCT 41.4 06/01/2021   MCV 86.4 06/01/2021   PLT 363 06/01/2021   Lab Results  Component Value Date   NA 137 08/13/2021   K 4.2 08/13/2021   CO2 19 (L) 08/13/2021   GLUCOSE 100 (H) 08/13/2021   BUN 22 08/13/2021   CREATININE 1.37 (H) 08/13/2021   BILITOT 0.6 06/01/2021   ALKPHOS 56 06/01/2021   AST 18 06/01/2021   ALT 11 06/01/2021   PROT 7.2 06/01/2021   ALBUMIN 3.5 06/01/2021   CALCIUM 9.2 08/13/2021   ANIONGAP 11 08/13/2021   EGFR  55 (L) 12/31/2020   Lab Results  Component Value Date   CHOL 110 11/12/2020   Lab Results  Component Value Date   HDL 22 (L) 11/12/2020   Lab Results  Component Value Date   LDLCALC 70 11/12/2020   Lab Results  Component Value Date   TRIG 91 11/12/2020   Lab Results  Component Value Date   CHOLHDL 5.0 11/12/2020   Lab Results  Component Value Date   HGBA1C 6.0 (H) 11/25/2020      Assessment & Plan:   Problem List Items Addressed This Visit       Cardiovascular and Mediastinum   Essential hypertension    stable      Acute HFrEF (heart failure with reduced ejection fraction) (HCC) - Primary    stable        Musculoskeletal and Integument   Chronic gout due to renal impairment involving foot without tophus    Pt has gouty swelling of lt wrist        Genitourinary   Stage 3a chronic kidney disease ([place])    stable       No orders of the defined types were placed in this encounter.   Follow-up: No follow-ups on file.    Cletis Athens, MD

## 2021-09-27 NOTE — Assessment & Plan Note (Signed)
Pt has gouty swelling of lt wrist

## 2021-10-23 ENCOUNTER — Other Ambulatory Visit: Payer: Self-pay

## 2021-10-23 ENCOUNTER — Emergency Department
Admission: EM | Admit: 2021-10-23 | Discharge: 2021-10-23 | Disposition: A | Discharge status: Home / Self Care | Payer: Medicare HMO | Attending: Emergency Medicine | Admitting: Emergency Medicine

## 2021-10-23 ENCOUNTER — Encounter: Payer: Self-pay | Admitting: Emergency Medicine

## 2021-10-23 DIAGNOSIS — I509 Heart failure, unspecified: Secondary | ICD-10-CM | POA: Insufficient documentation

## 2021-10-23 DIAGNOSIS — I110 Hypertensive heart disease with heart failure: Secondary | ICD-10-CM | POA: Insufficient documentation

## 2021-10-23 DIAGNOSIS — I2510 Atherosclerotic heart disease of native coronary artery without angina pectoris: Secondary | ICD-10-CM | POA: Diagnosis not present

## 2021-10-23 DIAGNOSIS — M25532 Pain in left wrist: Secondary | ICD-10-CM | POA: Diagnosis not present

## 2021-10-23 DIAGNOSIS — M109 Gout, unspecified: Secondary | ICD-10-CM | POA: Diagnosis not present

## 2021-10-23 DIAGNOSIS — M1000 Idiopathic gout, unspecified site: Secondary | ICD-10-CM | POA: Insufficient documentation

## 2021-10-23 DIAGNOSIS — M10012 Idiopathic gout, left shoulder: Secondary | ICD-10-CM | POA: Diagnosis not present

## 2021-10-23 MED ORDER — HYDROCODONE-ACETAMINOPHEN 5-325 MG PO TABS: 1.0000 | ORAL_TABLET | Freq: Four times a day (QID) | ORAL | 0 refills | Status: DC | PRN

## 2021-10-23 MED ORDER — METHYLPREDNISOLONE 4 MG PO TBPK: ORAL_TABLET | ORAL | 0 refills | Status: DC

## 2021-10-23 MED ORDER — COLCHICINE 0.6 MG PO TABS: ORAL_TABLET | ORAL | 0 refills | Status: DC

## 2021-10-23 NOTE — ED Triage Notes (Signed)
C/O left wrist pain and swelling x 10 days.  Denies injury. States he has history of gout.

## 2021-10-23 NOTE — ED Provider Notes (Signed)
[facility] Provider Note    Event Date/Time   First MD Initiated Contact with Patient 10/23/21 1151     (approximate)   History   Wrist Pain   HPI  Benjamin Valentine is a 76 y.o. male with history of gout, hypertension, CAD, CHF, AKI presents emergency department with acute left wrist pain.  Patient states been ongoing for 1 week.  Has history of gout.  No known injury.  States it is warm and hot like it normally is with gout.  States he is doctor had cut his gout medication back.      Physical Exam   Triage Vital Signs: ED Triage Vitals  Enc Vitals Group     BP 10/23/21 1147 (!) 157/78     Pulse Rate 10/23/21 1147 81     Resp 10/23/21 1147 16     Temp 10/23/21 1147 97.8 F (36.6 C)     Temp Source 10/23/21 1147 Oral     SpO2 10/23/21 1147 96 %     Weight 10/23/21 1148 209 lb 10.5 oz (95.1 kg)     Height 10/23/21 1148 5\' 3"  (1.6 m)     Head Circumference --      Peak Flow --      Pain Score 10/23/21 1147 8     Pain Loc --      Pain Edu? --      Excl. in GC? --     Most recent vital signs: Vitals:   10/23/21 1147  BP: (!) 157/78  Pulse: 81  Resp: 16  Temp: 97.8 F (36.6 C)  SpO2: 96%     General: Awake, no distress.   CV:  Good peripheral perfusion. regular rate and  rhythm Resp:  Normal effort.  Abd:  No distention.   Other:  Left wrist is warm to touch, tender at the bony joints, neurovascular is intact, full range of motion,   ED Results / Procedures / Treatments   Labs (all labs ordered are listed, but only abnormal results are displayed) Labs Reviewed - No data to display   EKG     RADIOLOGY     PROCEDURES:   Procedures   MEDICATIONS ORDERED IN ED: Medications - No data to display   IMPRESSION / MDM / ASSESSMENT AND PLAN / ED COURSE  I reviewed the triage vital signs and the nursing notes.                              Differential diagnosis includes, but is not limited to, gout, cellulitis,  arthritis  Patient's presentation is most consistent with acute, uncomplicated illness.   Patient's presentation is typical of gout.  With his history we will go ahead and treat for gout.  He is placed in wrist brace and a sling for comfort.  He was given a prescription for Medrol Dosepak, colchicine, and Vicodin.  He is to return emergency department worsening.  Follow-up with his regular doctor as needed.  He was discharged in stable condition and is in agreement treatment plan.      FINAL CLINICAL IMPRESSION(S) / ED DIAGNOSES   Final diagnoses:  Idiopathic gout, unspecified chronicity, unspecified site     Rx / DC Orders   ED Discharge Orders          Ordered    colchicine 0.6 MG tablet        10/23/21 1210  methylPREDNISolone (MEDROL DOSEPAK) 4 MG TBPK tablet        10/23/21 1210    HYDROcodone-acetaminophen (NORCO/VICODIN) 5-325 MG tablet  Every 6 hours PRN        10/23/21 1210             Note:  This document was prepared using Dragon voice recognition software and may include unintentional dictation errors.    Faythe Ghee, [name] 10/23/21 1213    Georga Hacking, MD 10/23/21 [phone]

## 2021-11-01 DIAGNOSIS — M109 Gout, unspecified: Secondary | ICD-10-CM | POA: Diagnosis not present

## 2021-11-02 ENCOUNTER — Other Ambulatory Visit: Payer: Self-pay | Admitting: Family

## 2021-11-02 MED ORDER — DAPAGLIFLOZIN PROPANEDIOL 10 MG PO TABS: 10.0000 mg | ORAL_TABLET | Freq: Every day | ORAL | 3 refills | Status: DC

## 2021-11-15 ENCOUNTER — Ambulatory Visit: Payer: Medicare HMO | Attending: Family | Admitting: Family

## 2021-11-15 ENCOUNTER — Other Ambulatory Visit
Admission: RE | Admit: 2021-11-15 | Discharge: 2021-11-15 | Disposition: A | Discharge status: Home / Self Care | Payer: Medicare HMO | Source: Ambulatory Visit | Attending: Family | Admitting: Family

## 2021-11-15 ENCOUNTER — Encounter: Payer: Self-pay | Admitting: Family

## 2021-11-15 VITALS — BP 143/67 | HR 79 | Resp 16 | Ht 69.0 in | Wt 210.0 lb

## 2021-11-15 DIAGNOSIS — N189 Chronic kidney disease, unspecified: Secondary | ICD-10-CM | POA: Insufficient documentation

## 2021-11-15 DIAGNOSIS — I130 Hypertensive heart and chronic kidney disease with heart failure and stage 1 through stage 4 chronic kidney disease, or unspecified chronic kidney disease: Secondary | ICD-10-CM | POA: Diagnosis not present

## 2021-11-15 DIAGNOSIS — M1990 Unspecified osteoarthritis, unspecified site: Secondary | ICD-10-CM | POA: Diagnosis not present

## 2021-11-15 DIAGNOSIS — M1A0320 Idiopathic chronic gout, left wrist, without tophus (tophi): Secondary | ICD-10-CM | POA: Diagnosis not present

## 2021-11-15 DIAGNOSIS — I2510 Atherosclerotic heart disease of native coronary artery without angina pectoris: Secondary | ICD-10-CM | POA: Diagnosis not present

## 2021-11-15 DIAGNOSIS — M109 Gout, unspecified: Secondary | ICD-10-CM | POA: Insufficient documentation

## 2021-11-15 DIAGNOSIS — I5022 Chronic systolic (congestive) heart failure: Secondary | ICD-10-CM | POA: Insufficient documentation

## 2021-11-15 DIAGNOSIS — Z7984 Long term (current) use of oral hypoglycemic drugs: Secondary | ICD-10-CM | POA: Diagnosis not present

## 2021-11-15 DIAGNOSIS — Z87891 Personal history of nicotine dependence: Secondary | ICD-10-CM | POA: Diagnosis not present

## 2021-11-15 DIAGNOSIS — I10 Essential (primary) hypertension: Secondary | ICD-10-CM

## 2021-11-15 DIAGNOSIS — Z951 Presence of aortocoronary bypass graft: Secondary | ICD-10-CM | POA: Diagnosis not present

## 2021-11-15 LAB — BASIC METABOLIC PANEL
Anion gap: 4 — ABNORMAL LOW (ref 5–15)
BUN: 21 mg/dL (ref 8–23)
CO2: 24 mmol/L (ref 22–32)
Calcium: 9.3 mg/dL (ref 8.9–10.3)
Chloride: 112 mmol/L — ABNORMAL HIGH (ref 98–111)
Creatinine, Ser: 1.73 mg/dL — ABNORMAL HIGH (ref 0.61–1.24)
GFR, Estimated: 40 mL/min — ABNORMAL LOW (ref >60)
Glucose, Bld: 93 mg/dL (ref 70–99)
Potassium: 4.2 mmol/L (ref 3.5–5.1)
Sodium: 140 mmol/L (ref 135–145)

## 2021-11-15 MED ORDER — SACUBITRIL-VALSARTAN 24-26 MG PO TABS: 1.0000 | ORAL_TABLET | Freq: Two times a day (BID) | ORAL | 3 refills | Status: DC

## 2021-11-15 NOTE — Patient Instructions (Addendum)
Begin weighing daily and call for an overnight weight gain of 3 pounds or more or a weekly weight gain of more than 5 pounds.   If you have voicemail, please make sure your mailbox is cleaned out so that we may leave a message and please make sure to listen to any voicemails.    Start taking entresto as 1 tablet in the morning in the morning and 1 tablet in the evening

## 2021-11-15 NOTE — Progress Notes (Signed)
Patient ID: Benjamin Valentine, male    DOB: [birth year], 76 y.o.   MRN: [id number]   [name] is Valentine 76 y/o male with Valentine history of CAD (CABG), HTN, arthritis, gout, previous tobacco use and chronic heart failure.   Echo report from 11/11/20 reviewed and showed an EF of 25-30% along with mild MR.   RHC/LHC done 11/15/20 and showed: Ost LM lesion is 20% stenosed.   Prox Cx to Mid Cx lesion is 99% stenosed.   Mid Cx to Dist Cx lesion is 99% stenosed.   1st Mrg lesion is 30% stenosed.   Mid LAD lesion is 100% stenosed.   Prox RCA lesion is 100% stenosed.   Origin lesion is 100% stenosed.   SVG.   LIMA graft was visualized by angiography and is normal in caliber.   The graft exhibits no disease. 1.  Significant underlying three-vessel coronary artery disease with occluded mid LAD, occluded proximal right coronary artery and subtotal occlusion of the mid left circumflex after the origin of Valentine large high OM1 which supplies the majority of the left circumflex distribution. Patent LIMA to LAD with occluded SVG to RCA and SVG to OM. The RCA gets left-to-right collaterals. 2.  Left ventricular angiography was not performed due to chronic kidney disease. 3.  Right heart catheterization showed high normal filling pressures, moderate pulmonary hypertension and moderately to severely reduced cardiac output.  Was in the ED 10/23/21 due to left wrist gout where he was treated and released. Was in the ED 08/13/21 due to left wrist gout where he was evaluated and released. Was in the ED 06/01/21 due to dizziness, headache and chest pain. Accidentally took Valentine double dose of his medications today. Symptoms improved and he was released.   He presents today for Valentine follow-up visit with Valentine chief complaint of minimal fatigue upon moderate exertion. Describes this as chronic in nature. He has associated headaches, slight weight gain and left wrist swelling/ pain due to gout along with this. Denies any difficulty sleeping, abdominal  distention, palpitations, pedal edema, chest pain, shortness of breath, cough or dizziness.   His biggest complaint is of his recurring gout pain. He had been only taking his allopurinol as 1/2 tablet (50mg ) daily as the whole 100mg  daily caused diarrhea. Reviewed high purine diets to try and avoid.   Past Medical History:  Diagnosis Date   Arthritis    Chronic HFrEF (heart failure with reduced ejection fraction) ([place])    Valentine. 10/2020 Echo: EF 25-30%, nl RV fxn, mild MR.   CKD (chronic kidney disease), stage III ([place])    Coronary artery disease    Valentine. 2002 s/p CABG x 2: LIMA->LAD, VG->RPDA; b. 11/2020 Cath: LM 20ost, LAD 136m, LCX 99p/m, 26m/d, OM1 30, RCA 100p - fills via collats from LAD. RPL1 fills via collats from LCX. VG->PDA 100, LIMA->LAD ok-->med rx.   Gout    Hyperlipidemia LDL goal <70    Hypertension    Ischemic cardiomyopathy    Valentine. 10/2020 Echo: EF 25-30%.   Osteoarthritis    Past Surgical History:  Procedure Laterality Date   CARDIAC SURGERY     CABG 2002   LAPAROSCOPIC APPENDECTOMY N/Valentine 08/11/2018   Procedure: APPENDECTOMY LAPAROSCOPIC;  Surgeon: Benjamin Ree, MD;  Location: [facility] ORS;  Service: General;  Laterality: N/Valentine;   RIGHT/LEFT HEART CATH AND CORONARY/GRAFT ANGIOGRAPHY N/Valentine 11/15/2020   Procedure: RIGHT/LEFT HEART CATH AND CORONARY/GRAFT ANGIOGRAPHY;  Surgeon: Benjamin Hampshire, MD;  Location: [facility];  Service: Cardiovascular;  Laterality: N/Valentine;   No family history on file. Social History   Tobacco Use   Smoking status: Former   Smokeless tobacco: Never  Substance Use Topics   Alcohol use: No   No Known Allergies  Prior to Admission medications   Medication Sig Start Date End Date Taking? Authorizing Provider  allopurinol (ZYLOPRIM) 100 MG tablet Take 1 tablet (100 mg total) by mouth daily. 06/29/21  Yes Benjamin Athens, MD  aspirin EC 81 MG EC tablet Take 1 tablet (81 mg total) by mouth daily. Swallow whole. 11/27/20  Yes Benjamin Dee, MD   atorvastatin (LIPITOR) 40 MG tablet TAKE 1 TABLET BY MOUTH EVERY DAY 05/23/21  Yes Benjamin Valentine, Benjamin Shove, MD  Blood Pressure Monitoring (SPHYGMOMANOMETER) MISC 1 each by Does not apply route daily. 05/01/21  Yes Benjamin Lo, NP  carvedilol (COREG) 3.125 MG tablet TAKE 1 TABLET (3.125 MG TOTAL) BY MOUTH 2 (TWO) TIMES DAILY WITH Valentine MEAL. 05/30/21  Yes Benjamin Valentine, Benjamin Shove, MD  dapagliflozin propanediol (FARXIGA) 10 MG TABS tablet Take 1 tablet (10 mg total) by mouth daily. 11/02/21  Yes Benjamin Price Valentine, Benjamin Valentine  furosemide (LASIX) 20 MG tablet Take 1 tablet (20 mg total) by mouth daily as needed (As needed for weight gain of 2 pounds overnight). 06/29/21  Yes Benjamin Valentine, Benjamin Shove, MD  hydrALAZINE (APRESOLINE) 50 MG tablet TAKE 1 TABLET BY MOUTH THREE TIMES Valentine DAY 05/30/21  Yes Benjamin Valentine, Benjamin Shove, MD  isosorbide mononitrate (IMDUR) 30 MG 24 hr tablet Take 1 tablet (30 mg total) by mouth daily. 06/29/21  Yes Benjamin Valentine, Benjamin Shove, MD  colchicine 0.6 MG tablet Take 2 tabs p.o. x1 then take 1 tab p.o. 1 hour later for gout attack. Patient not taking: Reported on 11/15/2021 10/23/21   Benjamin Starks, Benjamin Valentine  spironolactone (ALDACTONE) 25 MG tablet TAKE 1 TABLET (25 MG TOTAL) BY MOUTH DAILY. Patient not taking: Reported on 11/15/2021 05/02/21   Benjamin Athens, MD   Review of Systems  Constitutional:  Positive for fatigue. Negative for appetite change.  HENT:  Negative for congestion, postnasal drip and sore throat.   Eyes: Negative.   Respiratory:  Negative for cough, chest tightness and shortness of breath.   Cardiovascular:  Negative for chest pain, palpitations and leg swelling.  Gastrointestinal:  Negative for abdominal distention and abdominal pain.  Endocrine: Negative.   Musculoskeletal:  Positive for arthralgias (left arm/wrist) and joint swelling (left wrist). Negative for neck pain.  Skin: Negative.   Allergic/Immunologic: Negative.   Neurological:  Positive for headaches. Negative for dizziness and light-headedness.  Hematological:   Negative for adenopathy. Does not bruise/bleed easily.  Psychiatric/Behavioral:  Negative for dysphoric mood and sleep disturbance (sleeping on 1 pillows). The patient is not nervous/anxious.    Vitals:   11/15/21 1234  BP: (!) 143/67  Pulse: 79  Resp: 16  SpO2: 99%  Weight: 210 lb (95.3 kg)  Height: 5\' 9"  (1.753 m)   Wt Readings from Last 3 Encounters:  11/15/21 210 lb (95.3 kg)  10/23/21 209 lb 10.5 oz (95.1 kg)  09/27/21 209 lb 9.6 oz (95.1 kg)   Lab Results  Component Value Date   CREATININE 1.37 (H) 08/13/2021   CREATININE 1.27 (H) 06/01/2021   CREATININE 1.35 (H) 05/05/2021   Physical Exam Vitals and nursing note reviewed. Exam conducted with Valentine chaperone present ("friend who is like Valentine daughter").  Constitutional:      General: He is not in acute distress.    Appearance: Normal appearance.  HENT:     Head: Normocephalic and atraumatic.  Cardiovascular:     Rate and Rhythm: Normal rate and regular rhythm.  Pulmonary:     Effort: Pulmonary effort is normal. No respiratory distress.     Breath sounds: No wheezing or rales.  Abdominal:     General: Abdomen is flat. There is no distension.     Palpations: Abdomen is soft.  Musculoskeletal:        General: Swelling (left wrist) and tenderness (left wrist) present.     Cervical back: Normal range of motion and neck supple. No rigidity.     Right lower leg: No edema.     Left lower leg: No edema.  Skin:    General: Skin is warm and dry.  Neurological:     General: No focal deficit present.     Mental Status: He is alert and oriented to person, place, and time.  Psychiatric:        Mood and Affect: Mood normal.        Behavior: Behavior normal.        Thought Content: Thought content normal.     Assessment & Plan:  1: Chronic heart failure with reduced ejection fraction- - NYHA class II - euvolemic today - not weighing daily but does have scales; encouraged to resume weighing daily and to call for an overnight  weight gain of > 2 pounds or Valentine weekly weight gain of > 5 pounds - weight up 3 pounds from last visit here 3 months ago - not adding salt and friend ("daughter") does most of the cooking and doesn't cook with salt either - on GDMT of carvedilol and farxiga  - will add entresto 24/26mg  BID; 30 day voucher provided today - check BMP next visit - BNP 11/11/20 was 616.5  2: HTN with CKD- - BP mildly elevated (143/67); adding entresto per above - saw PCP (Benjamin Valentine) 09/27/21 - BMP 08/13/21 reviewed and showed sodium 137, potassium 4.2, creatinine 1.37 and GFR 53 - check BMP today  3: CAD- - CABG back in 2002 - saw cardiology Sharolyn Douglas) 12/31/20   4: Left wrist gout- - finished taking colchicine - has has Valentine recent ED visit due to gout flare - reviewed potential dietary things that can trigger gout - encouraged him to try taking 100mg  allopurinol alternating with 50mg  QOD and f/u with PCP regarding this   Medication bottles reviewed.   Return in 1 month, sooner if needed.

## 2021-11-24 ENCOUNTER — Emergency Department: Payer: Medicare HMO

## 2021-11-24 ENCOUNTER — Other Ambulatory Visit: Payer: Self-pay

## 2021-11-24 ENCOUNTER — Observation Stay
Admission: EM | Admit: 2021-11-24 | Discharge: 2021-11-25 | Disposition: A | Discharge status: Home / Self Care | Payer: Medicare HMO | Attending: Hospitalist | Admitting: Hospitalist

## 2021-11-24 ENCOUNTER — Encounter: Payer: Self-pay | Admitting: Emergency Medicine

## 2021-11-24 DIAGNOSIS — R29810 Facial weakness: Secondary | ICD-10-CM | POA: Diagnosis not present

## 2021-11-24 DIAGNOSIS — N1831 Chronic kidney disease, stage 3a: Secondary | ICD-10-CM | POA: Diagnosis not present

## 2021-11-24 DIAGNOSIS — R531 Weakness: Secondary | ICD-10-CM | POA: Diagnosis not present

## 2021-11-24 DIAGNOSIS — I6623 Occlusion and stenosis of bilateral posterior cerebral arteries: Secondary | ICD-10-CM | POA: Diagnosis not present

## 2021-11-24 DIAGNOSIS — Z87891 Personal history of nicotine dependence: Secondary | ICD-10-CM | POA: Diagnosis not present

## 2021-11-24 DIAGNOSIS — I5022 Chronic systolic (congestive) heart failure: Secondary | ICD-10-CM | POA: Diagnosis not present

## 2021-11-24 DIAGNOSIS — Z79899 Other long term (current) drug therapy: Secondary | ICD-10-CM | POA: Insufficient documentation

## 2021-11-24 DIAGNOSIS — I6503 Occlusion and stenosis of bilateral vertebral arteries: Secondary | ICD-10-CM | POA: Diagnosis not present

## 2021-11-24 DIAGNOSIS — R29818 Other symptoms and signs involving the nervous system: Secondary | ICD-10-CM | POA: Diagnosis not present

## 2021-11-24 DIAGNOSIS — I130 Hypertensive heart and chronic kidney disease with heart failure and stage 1 through stage 4 chronic kidney disease, or unspecified chronic kidney disease: Secondary | ICD-10-CM | POA: Insufficient documentation

## 2021-11-24 DIAGNOSIS — I10 Essential (primary) hypertension: Secondary | ICD-10-CM | POA: Diagnosis not present

## 2021-11-24 DIAGNOSIS — Z951 Presence of aortocoronary bypass graft: Secondary | ICD-10-CM | POA: Diagnosis not present

## 2021-11-24 DIAGNOSIS — Z7982 Long term (current) use of aspirin: Secondary | ICD-10-CM | POA: Diagnosis not present

## 2021-11-24 DIAGNOSIS — I639 Cerebral infarction, unspecified: Principal | ICD-10-CM | POA: Insufficient documentation

## 2021-11-24 DIAGNOSIS — R4781 Slurred speech: Secondary | ICD-10-CM | POA: Insufficient documentation

## 2021-11-24 DIAGNOSIS — I2510 Atherosclerotic heart disease of native coronary artery without angina pectoris: Secondary | ICD-10-CM | POA: Diagnosis not present

## 2021-11-24 DIAGNOSIS — R471 Dysarthria and anarthria: Secondary | ICD-10-CM | POA: Insufficient documentation

## 2021-11-24 DIAGNOSIS — Z7984 Long term (current) use of oral hypoglycemic drugs: Secondary | ICD-10-CM | POA: Diagnosis not present

## 2021-11-24 DIAGNOSIS — I6523 Occlusion and stenosis of bilateral carotid arteries: Secondary | ICD-10-CM | POA: Diagnosis not present

## 2021-11-24 DIAGNOSIS — R2990 Unspecified symptoms and signs involving the nervous system: Secondary | ICD-10-CM | POA: Diagnosis present

## 2021-11-24 LAB — URINALYSIS, ROUTINE W REFLEX MICROSCOPIC
Bacteria, UA: NONE SEEN
Bilirubin Urine: NEGATIVE
Glucose, UA: >=500 mg/dL — AB
Hgb urine dipstick: NEGATIVE
Ketones, ur: NEGATIVE mg/dL
Leukocytes,Ua: NEGATIVE
Nitrite: NEGATIVE
Protein, ur: NEGATIVE mg/dL
Specific Gravity, Urine: 1.023 (ref 1.005–1.030)
Squamous Epithelial / HPF: NONE SEEN (ref 0–5)
pH: 6 (ref 5.0–8.0)

## 2021-11-24 LAB — COMPREHENSIVE METABOLIC PANEL
ALT: 12 U/L (ref 0–44)
AST: 13 U/L — ABNORMAL LOW (ref 15–41)
Albumin: 3.9 g/dL (ref 3.5–5.0)
Alkaline Phosphatase: 46 U/L (ref 38–126)
Anion gap: 5 (ref 5–15)
BUN: 22 mg/dL (ref 8–23)
CO2: 26 mmol/L (ref 22–32)
Calcium: 9 mg/dL (ref 8.9–10.3)
Chloride: 108 mmol/L (ref 98–111)
Creatinine, Ser: 1.27 mg/dL — ABNORMAL HIGH (ref 0.61–1.24)
GFR, Estimated: 58 mL/min — ABNORMAL LOW (ref >60)
Glucose, Bld: 117 mg/dL — ABNORMAL HIGH (ref 70–99)
Potassium: 4.4 mmol/L (ref 3.5–5.1)
Sodium: 139 mmol/L (ref 135–145)
Total Bilirubin: 0.5 mg/dL (ref 0.3–1.2)
Total Protein: 7.8 g/dL (ref 6.5–8.1)

## 2021-11-24 LAB — DIFFERENTIAL
Abs Immature Granulocytes: 0.07 10*3/uL (ref 0.00–0.07)
Basophils Absolute: 0 10*3/uL (ref 0.0–0.1)
Basophils Relative: 0 %
Eosinophils Absolute: 0.1 10*3/uL (ref 0.0–0.5)
Eosinophils Relative: 1 %
Immature Granulocytes: 1 %
Lymphocytes Relative: 12 %
Lymphs Abs: 1.1 10*3/uL (ref 0.7–4.0)
Monocytes Absolute: 0.7 10*3/uL (ref 0.1–1.0)
Monocytes Relative: 8 %
Neutro Abs: 7.1 10*3/uL (ref 1.7–7.7)
Neutrophils Relative %: 78 %

## 2021-11-24 LAB — URINE DRUG SCREEN, QUALITATIVE (ARMC ONLY)
Amphetamines, Ur Screen: NOT DETECTED
Barbiturates, Ur Screen: NOT DETECTED
Benzodiazepine, Ur Scrn: NOT DETECTED
Cannabinoid 50 Ng, Ur ~~LOC~~: NOT DETECTED
Cocaine Metabolite,Ur ~~LOC~~: NOT DETECTED
MDMA (Ecstasy)Ur Screen: NOT DETECTED
Methadone Scn, Ur: NOT DETECTED
Opiate, Ur Screen: NOT DETECTED
Phencyclidine (PCP) Ur S: NOT DETECTED
Tricyclic, Ur Screen: NOT DETECTED

## 2021-11-24 LAB — CBC
HCT: 39.5 % (ref 39.0–52.0)
Hemoglobin: 12.1 g/dL — ABNORMAL LOW (ref 13.0–17.0)
MCH: 25.5 pg — ABNORMAL LOW (ref 26.0–34.0)
MCHC: 30.6 g/dL (ref 30.0–36.0)
MCV: 83.3 fL (ref 80.0–100.0)
Platelets: 373 10*3/uL (ref 150–400)
RBC: 4.74 MIL/uL (ref 4.22–5.81)
RDW: 16.2 % — ABNORMAL HIGH (ref 11.5–15.5)
WBC: 9.1 10*3/uL (ref 4.0–10.5)
nRBC: 0 % (ref 0.0–0.2)

## 2021-11-24 LAB — PROTIME-INR
INR: 1 (ref 0.8–1.2)
Prothrombin Time: 12.7 seconds (ref 11.4–15.2)

## 2021-11-24 LAB — ETHANOL: Alcohol, Ethyl (B): <10 mg/dL (ref <10)

## 2021-11-24 LAB — HEMOGLOBIN A1C
Hgb A1c MFr Bld: 6.4 % — ABNORMAL HIGH (ref 4.8–5.6)
Mean Plasma Glucose: 136.98 mg/dL

## 2021-11-24 LAB — APTT: aPTT: 44 seconds — ABNORMAL HIGH (ref 24–36)

## 2021-11-24 LAB — CBG MONITORING, ED: Glucose-Capillary: 123 mg/dL — ABNORMAL HIGH (ref 70–99)

## 2021-11-24 MED ORDER — STROKE: EARLY STAGES OF RECOVERY BOOK: Freq: Once | Status: DC

## 2021-11-24 MED ORDER — ASPIRIN 81 MG PO CHEW
324.0000 mg | CHEWABLE_TABLET | Freq: Once | ORAL | Status: AC
  Administered 2021-11-24: 324 mg via ORAL
  Filled 2021-11-24: qty 4

## 2021-11-24 MED ORDER — ENOXAPARIN SODIUM 40 MG/0.4ML IJ SOSY
40.0000 mg | PREFILLED_SYRINGE | INTRAMUSCULAR | Status: DC
  Administered 2021-11-24: 40 mg via SUBCUTANEOUS
  Filled 2021-11-24: qty 0.4

## 2021-11-24 MED ORDER — ATORVASTATIN CALCIUM 20 MG PO TABS
40.0000 mg | ORAL_TABLET | Freq: Every day | ORAL | Status: DC
  Administered 2021-11-25: 40 mg via ORAL
  Filled 2021-11-24: qty 2

## 2021-11-24 MED ORDER — ASPIRIN 81 MG PO CHEW
81.0000 mg | CHEWABLE_TABLET | Freq: Every day | ORAL | Status: DC
  Administered 2021-11-25: 81 mg via ORAL
  Filled 2021-11-24: qty 1

## 2021-11-24 MED ORDER — CLOPIDOGREL BISULFATE 75 MG PO TABS
75.0000 mg | ORAL_TABLET | Freq: Every day | ORAL | Status: DC
  Administered 2021-11-24 – 2021-11-25 (×2): 75 mg via ORAL
  Filled 2021-11-24 (×2): qty 1

## 2021-11-24 MED ORDER — IOHEXOL 350 MG/ML SOLN
75.0000 mL | Freq: Once | INTRAVENOUS | Status: AC | PRN
  Administered 2021-11-24: 75 mL via INTRAVENOUS

## 2021-11-24 NOTE — Progress Notes (Signed)
CODE STROKE- PHARMACY COMMUNICATION  Time CODE STROKE called/page received: 1256  Time response to CODE STROKE was made: immediately  Time Stroke Kit retrieved from [facility] (only if needed): N/A - no TNK per neurologist  Name of Provider/Nurse contacted: Dr. Quinn Axe  Past Medical History:  Diagnosis Date   Arthritis    Chronic HFrEF (heart failure with reduced ejection fraction) ([place])    a. 10/2020 Echo: EF 25-30%, nl RV fxn, mild MR.   CKD (chronic kidney disease), stage III ([place])    Coronary artery disease    a. 2002 s/p CABG x 2: LIMA->LAD, VG->RPDA; b. 11/2020 Cath: LM 20ost, LAD 161m LCX 99p/m, 958m, OM1 30, RCA 100p - fills via collats from LAD. RPL1 fills via collats from LCX. VG->PDA 100, LIMA->LAD ok-->med rx.   Gout    Hyperlipidemia LDL goal <70    Hypertension    Ischemic cardiomyopathy    a. 10/2020 Echo: EF 25-30%.   Osteoarthritis    Prior to Admission medications   Medication Sig Start Date End Date Taking? Authorizing Provider  allopurinol (ZYLOPRIM) 100 MG tablet Take 1 tablet (100 mg total) by mouth daily. 06/29/21   MaCletis AthensMD  aspirin EC 81 MG EC tablet Take 1 tablet (81 mg total) by mouth daily. Swallow whole. 11/27/20   GiDwyane DeeMD  atorvastatin (LIPITOR) 40 MG tablet TAKE 1 TABLET BY MOUTH EVERY DAY 05/23/21   MaCletis AthensMD  Blood Pressure Monitoring (SEnd[facility] Of In[facility] Empire LLCMISC 1 each by Does not apply route daily. 05/01/21   KaTheresia LoNP  carvedilol (COREG) 3.125 MG tablet TAKE 1 TABLET (3.125 MG TOTAL) BY MOUTH 2 (TWO) TIMES DAILY WITH A MEAL. 05/30/21   MaCletis AthensMD  colchicine 0.6 MG tablet Take 2 tabs p.o. x1 then take 1 tab p.o. 1 hour later for gout attack. Patient not taking: Reported on 11/15/2021 10/23/21   FiVersie StarksPA-C  dapagliflozin propanediol (FARXIGA) 10 MG TABS tablet Take 1 tablet (10 mg total) by mouth daily. 11/02/21   HaAlisa GraffFNP  furosemide (LASIX) 20 MG tablet Take 1 tablet (20 mg total) by mouth daily  as needed (As needed for weight gain of 2 pounds overnight). 06/29/21   MaCletis AthensMD  hydrALAZINE (APRESOLINE) 50 MG tablet TAKE 1 TABLET BY MOUTH THREE TIMES A DAY 05/30/21   MaCletis AthensMD  isosorbide mononitrate (IMDUR) 30 MG 24 hr tablet Take 1 tablet (30 mg total) by mouth daily. 06/29/21   MaCletis AthensMD  sacubitril-valsartan (ENTRESTO) 24-26 MG Take 1 tablet by mouth 2 (two) times daily. 11/15/21   HaAlisa GraffFNP  spironolactone (ALDACTONE) 25 MG tablet TAKE 1 TABLET (25 MG TOTAL) BY MOUTH DAILY. Patient not taking: Reported on 11/15/2021 05/02/21   MaCletis AthensMD   WiDara HoyerPharmD PGY-1 Pharmacy Resident 11/24/2021 1:12 PM

## 2021-11-24 NOTE — ED Notes (Signed)
Pt states he does not currently smoke

## 2021-11-24 NOTE — H&P (Addendum)
History and Physical    Benjamin Valentine [id number] DOB: 08-09-1945 DOA: 11/24/2021  PCP: Benjamin Athens, MD  Patient coming from: home  I have personally briefly reviewed patient's old medical records in [place]  Chief Complaint: left-sided weakness and slurred speech.  HPI: Benjamin Valentine is Valentine 76 y.o. male with past medical history significant for history of  CAD s/p bypass surgery in 2022,  HTN, HLD, HFrEF, CKD 3a who presented after an episode of mild left-sided weakness and dysarthria this morning.  Family noted left facial droop and slurred speech.  Symptoms mostly resolved by the time of arrival.  Per family, pt takes ASA 81 daily at home, but not on Valentine statin, however, Lipitor is on home med list.  ED Course: initial vitals: afebrile, pulse 72, BP 150/93, RR 16, sating well on room air.  Basic labs unremarkable.  CT head no acute finding.  CTA head and neck showed severe multifocal stenosis.  Neuro consulted in the ED.   Assessment/Plan  Acute infarct --MRI brain showed acute infract in the right frontal and right parietal lobe.  --CTA H&N showed severe multifocal stenosis. Neuro to reach out to neuro IR regarding his R ICA stenosis but this does not require emergent intervention given near resolution of his sx, per Dr. Quinn Axe. Plan: --hold home BP meds for permissive HTN ; goal BP <220/110. PRN labetalol or hydralazine if BP above these parameters. --TTE - Check A1c and LDL + add statin per guidelines - ASA 81mg  daily + plavix 75mg  daily x90 days f/b plavix 75mg  daily monotherapy after that - q4 hr neuro checks --PT/OT  Chronic systolic CHF Ischemic cardiomyopathy --LVEF 25-30% in Sept 2022.   --hold coreg, Entresto, Lasix and spironolactone, hydralazine and Imdur for permissive HTN  CAD with hx of CABG --cont ASA and statin  HTN --hold home BP meds for permissive HTN  CKD 3a --Cr 1.27, at baseline.   DVT prophylaxis: Lovenox SQ Code Status: Full code   Family Communication: family updated at bedside today  Disposition Plan: home  Consults called: Neurology Level of care: Med-Surg   Review of Systems: As per HPI otherwise complete review of systems negative.   Past Medical History:  Diagnosis Date   Arthritis    Chronic HFrEF (heart failure with reduced ejection fraction) ([place])    Valentine. 10/2020 Echo: EF 25-30%, nl RV fxn, mild MR.   CKD (chronic kidney disease), stage III ([place])    Coronary artery disease    Valentine. 2002 s/p CABG x 2: LIMA->LAD, VG->RPDA; b. 11/2020 Cath: LM 20ost, LAD 171m, LCX 99p/m, 17m/d, OM1 30, RCA 100p - fills via collats from LAD. RPL1 fills via collats from LCX. VG->PDA 100, LIMA->LAD ok-->med rx.   Gout    Hyperlipidemia LDL goal <70    Hypertension    Ischemic cardiomyopathy    Valentine. 10/2020 Echo: EF 25-30%.   Osteoarthritis     Past Surgical History:  Procedure Laterality Date   CARDIAC SURGERY     CABG 2002   LAPAROSCOPIC APPENDECTOMY N/Valentine 08/11/2018   Procedure: APPENDECTOMY LAPAROSCOPIC;  Surgeon: Olean Ree, MD;  Location: [facility]S;  Service: General;  Laterality: N/Valentine;   RIGHT/LEFT HEART CATH AND CORONARY/GRAFT ANGIOGRAPHY N/Valentine 11/15/2020   Procedure: RIGHT/LEFT HEART CATH AND CORONARY/GRAFT ANGIOGRAPHY;  Surgeon: Wellington Hampshire, MD;  Location: [facility];  Service: Cardiovascular;  Laterality: N/Valentine;     reports that he has quit smoking. He has never used smokeless tobacco.  He reports that he does not drink alcohol and does not use drugs.  No Known Allergies  History reviewed. No pertinent family history.   Prior to Admission medications   Medication Sig Start Date End Date Taking? Authorizing Provider  allopurinol (ZYLOPRIM) 100 MG tablet Take 1 tablet (100 mg total) by mouth daily. 06/29/21  Yes Benjamin Athens, MD  aspirin EC 81 MG EC tablet Take 1 tablet (81 mg total) by mouth daily. Swallow whole. 11/27/20  Yes Benjamin Dee, MD  atorvastatin (LIPITOR) 40 MG tablet TAKE 1 TABLET BY  MOUTH EVERY DAY Patient taking differently: Take 40 mg by mouth daily. 05/23/21  Yes Benjamin Valentine, Benjamin Shove, MD  carvedilol (COREG) 3.125 MG tablet TAKE 1 TABLET (3.125 MG TOTAL) BY MOUTH 2 (TWO) TIMES DAILY WITH Valentine MEAL. 05/30/21  Yes Benjamin Valentine, Benjamin Shove, MD  dapagliflozin propanediol (FARXIGA) 10 MG TABS tablet Take 1 tablet (10 mg total) by mouth daily. 11/02/21  Yes Benjamin, Otila Kluver A, [name]  hydrALAZINE (APRESOLINE) 50 MG tablet TAKE 1 TABLET BY MOUTH THREE TIMES Valentine DAY Patient taking differently: Take 50 mg by mouth 3 (three) times daily. 05/30/21  Yes Benjamin Valentine, Benjamin Shove, MD  isosorbide mononitrate (IMDUR) 30 MG 24 hr tablet Take 1 tablet (30 mg total) by mouth daily. 06/29/21  Yes Benjamin Valentine, Benjamin Shove, MD  sacubitril-valsartan (ENTRESTO) 24-26 MG Take 1 tablet by mouth 2 (two) times daily. 11/15/21  Yes Benjamin Valentine A, [name]  Blood Pressure Monitoring (SPHYGMOMANOMETER) MISC 1 each by Does not apply route daily. 05/01/21   Benjamin Lo, NP  colchicine 0.6 MG tablet Take 2 tabs p.o. x1 then take 1 tab p.o. 1 hour later for gout attack. 10/23/21   Benjamin, Linden Dolin, [name]  furosemide (LASIX) 20 MG tablet Take 1 tablet (20 mg total) by mouth daily as needed (As needed for weight gain of 2 pounds overnight). 06/29/21   Benjamin Athens, MD  spironolactone (ALDACTONE) 25 MG tablet TAKE 1 TABLET (25 MG TOTAL) BY MOUTH DAILY. Patient not taking: Reported on 11/15/2021 05/02/21   Benjamin Athens, MD    Physical Exam: Vitals:   11/24/21 1430 11/24/21 1445 11/24/21 1500 11/24/21 1509  BP: (!) 145/87  (!) 146/82 (!) 146/82  Pulse: 68 67  68  Resp: 16 17 (!) 22 20  Temp:      TempSrc:      SpO2: 97% 98%  98%  Weight:      Height:        Constitutional: NAD, AAOx3 HEENT: conjunctivae and lids normal, EOMI CV: No cyanosis.   RESP: normal respiratory effort, on RA Neuro: II - XII grossly intact.   Psych: Normal mood and affect.  Appropriate judgement and reason  Labs on Admission: I have personally reviewed labs and imaging  studies  Time spent: 60 minutes  Benjamin Bi MD [facility]  If 7PM-7AM, please contact night-coverage 11/24/2021, 4:02 PM

## 2021-11-24 NOTE — ED Triage Notes (Addendum)
First Nurse: Pt here via [facility] with a near syncopal episode. Pt states he had a brief period where he could not speak and felt weak. Pt states symptoms have resolved on arrival to ED.   184/93 85 97% 147-cbg

## 2021-11-24 NOTE — ED Triage Notes (Signed)
Pt to ED via [facility] from home for slurred speech and facial droop. Slurred speech and facial droop have resolved. Pt does have some slight weakness in the left arm and some decreased sensation in the arm left. EDP in room seeing pt

## 2021-11-24 NOTE — Consult Note (Signed)
NEUROLOGY CONSULTATION NOTE   Date of service: November 24, 2021 Patient Name: Benjamin Valentine MRN:  [id number] DOB:  [birth year] Reason for consult: stroke code Requesting physician: Dr. Carrie Mew _ _ _   _ __   _ __ _ _  __ __   _ __   __ _  History of Present Illness   76 year old gentleman with past medical history significant for heart failure with reduced ejection fraction, CAD, hyperlipidemia, CKD who presented after an episode of mild left-sided weakness and dysarthria this morning.  Last known well was 1100.  He then developed a left facial droop and some very mild left grip weakness on the left and family noted that he was dysarthric at that time.  He remains dysarthric although edentulous and patient's family says that he is currently speaking as he usually does.  On my NIH stroke scale he was a 2 for dysarthria and left facial droop.  He has mildly decreased left grip strength but no left upper extremity drift.  CT head showed no acute intracranial process.  TNK was not administered due to mild and improving symptoms.  CTA head and neck showed no LVO but did show multifocal severe stenoses:  1. Severe stenosis of the non dominant right vertebral artery origin with intermittent non opacification throughout the neck and non opacification intradurally. 2. Severe stenosis of the proximal left V2 vertebral artery with more distal opacification. Also, severe stenosis of the intradural left vertebral artery. 3. Severe stenosis of the distal petrous right ICA and moderate stenosis of the right paraclinoid ICA. 4. Severe stenosis of bilateral P2 PCAs. 5. Hypoplastic right A1 ACA with probable superimposed severe stenosis. 6. Moderate distal ACA stenosis. 7. Approximately 40% stenosis of the proximal right ICA in the neck. 8. Approximately 50% stenosis of the left common carotid artery.  CNS imaging personally reviewed I agree with above interpretations.  Patient was observed in the  ED until he was outside the TNK window at 3:30 PM.  His stroke scale at that point was a 2 for an almost imperceptible left facial droop and dysarthria, although dysarthria is now baseline per family.   ROS   Per HPI: all other systems reviewed and are negative  Past History   I have reviewed the following:  Past Medical History:  Diagnosis Date   Arthritis    Chronic HFrEF (heart failure with reduced ejection fraction) ([place])    a. 10/2020 Echo: EF 25-30%, nl RV fxn, mild MR.   CKD (chronic kidney disease), stage III ([place])    Coronary artery disease    a. 2002 s/p CABG x 2: LIMA->LAD, VG->RPDA; b. 11/2020 Cath: LM 20ost, LAD 165m, LCX 99p/m, 63m/d, OM1 30, RCA 100p - fills via collats from LAD. RPL1 fills via collats from LCX. VG->PDA 100, LIMA->LAD ok-->med rx.   Gout    Hyperlipidemia LDL goal <70    Hypertension    Ischemic cardiomyopathy    a. 10/2020 Echo: EF 25-30%.   Osteoarthritis    Past Surgical History:  Procedure Laterality Date   CARDIAC SURGERY     CABG 2002   LAPAROSCOPIC APPENDECTOMY N/A 08/11/2018   Procedure: APPENDECTOMY LAPAROSCOPIC;  Surgeon: Olean Ree, MD;  Location: [facility] ORS;  Service: General;  Laterality: N/A;   RIGHT/LEFT HEART CATH AND CORONARY/GRAFT ANGIOGRAPHY N/A 11/15/2020   Procedure: RIGHT/LEFT HEART CATH AND CORONARY/GRAFT ANGIOGRAPHY;  Surgeon: Wellington Hampshire, MD;  Location: [facility] [place] [place] [facility];  Service: Cardiovascular;  Laterality: N/A;  History reviewed. No pertinent family history. Social History   Socioeconomic History   Marital status: Widowed    Spouse name: Not on file   Number of children: Not on file   Years of education: Not on file   Highest education level: Not on file  Occupational History   Not on file  Tobacco Use   Smoking status: Former   Smokeless tobacco: Never  Vaping Use   Vaping Use: Never used  Substance and Sexual Activity   Alcohol use: No   Drug use: No   Sexual activity: Not on file  Other Topics  Concern   Not on file  Social History Narrative   Not on file   Social Determinants of Health   Financial Resource Strain: Low Risk  (11/25/2020)   Overall Financial Resource Strain (CARDIA)    Difficulty of Paying Living Expenses: Not very hard  Food Insecurity: No Food Insecurity (11/25/2020)   Hunger Vital Sign    Worried About Running Out of Food in the Last Year: Never true    Ran Out of Food in the Last Year: Never true  Transportation Needs: No Transportation Needs (11/25/2020)   PRAPARE - Hydrologist (Medical): No    Lack of Transportation (Non-Medical): No  Physical Activity: Insufficiently Active (11/25/2020)   Exercise Vital Sign    Days of Exercise per Week: 2 days    Minutes of Exercise per Session: 20 min  Stress: Stress Concern Present (11/25/2020)   [place]    Feeling of Stress : To some extent  Social Connections: Socially Isolated (11/25/2020)   Social Connection and Isolation Panel [NHANES]    Frequency of Communication with Friends and Family: More than three times a week    Frequency of Social Gatherings with Friends and Family: More than three times a week    Attends Religious Services: Never    Marine scientist or Organizations: No    Attends Archivist Meetings: Never    Marital Status: Widowed   No Known Allergies  Medications   (Not in a hospital admission)    No current facility-administered medications for this encounter.  Current Outpatient Medications:    allopurinol (ZYLOPRIM) 100 MG tablet, Take 1 tablet (100 mg total) by mouth daily., Disp: 90 tablet, Rfl: 3   aspirin EC 81 MG EC tablet, Take 1 tablet (81 mg total) by mouth daily. Swallow whole., Disp: 30 tablet, Rfl: 11   atorvastatin (LIPITOR) 40 MG tablet, TAKE 1 TABLET BY MOUTH EVERY DAY, Disp: 90 tablet, Rfl: 2   Blood Pressure Monitoring (SPHYGMOMANOMETER) MISC, 1 each  by Does not apply route daily., Disp: 1 each, Rfl: 0   carvedilol (COREG) 3.125 MG tablet, TAKE 1 TABLET (3.125 MG TOTAL) BY MOUTH 2 (TWO) TIMES DAILY WITH A MEAL., Disp: 180 tablet, Rfl: 2   colchicine 0.6 MG tablet, Take 2 tabs p.o. x1 then take 1 tab p.o. 1 hour later for gout attack. (Patient not taking: Reported on 11/15/2021), Disp: 20 tablet, Rfl: 0   dapagliflozin propanediol (FARXIGA) 10 MG TABS tablet, Take 1 tablet (10 mg total) by mouth daily., Disp: 90 tablet, Rfl: 3   furosemide (LASIX) 20 MG tablet, Take 1 tablet (20 mg total) by mouth daily as needed (As needed for weight gain of 2 pounds overnight)., Disp: 90 tablet, Rfl: 3   hydrALAZINE (APRESOLINE) 50 MG tablet, TAKE 1 TABLET BY MOUTH THREE  TIMES A DAY, Disp: 270 tablet, Rfl: 2   isosorbide mononitrate (IMDUR) 30 MG 24 hr tablet, Take 1 tablet (30 mg total) by mouth daily., Disp: 90 tablet, Rfl: 3   sacubitril-valsartan (ENTRESTO) 24-26 MG, Take 1 tablet by mouth 2 (two) times daily., Disp: 60 tablet, Rfl: 3   spironolactone (ALDACTONE) 25 MG tablet, TAKE 1 TABLET (25 MG TOTAL) BY MOUTH DAILY. (Patient not taking: Reported on 11/15/2021), Disp: 90 tablet, Rfl: 1  Vitals   Vitals:   11/24/21 1245 11/24/21 1248 11/24/21 1330 11/24/21 1402  BP:  (!) 150/93 (!) 157/80 (!) 148/81  Pulse:   69 68  Resp:   (!) 22 19  Temp:  98.3 F (36.8 C)    TempSrc:  Oral    SpO2:   97% 100%  Weight: 86.2 kg     Height: 5\' 9"  (1.753 m)        Body mass index is 28.06 kg/m.  Physical Exam   Physical Exam Gen: A&O x4, NAD HEENT: Atraumatic, normocephalic;mucous membranes moist; oropharynx clear, tongue without atrophy or fasciculations. Neck: Supple, trachea midline. Resp: CTAB, no w/r/r CV: RRR, no m/g/r; nml S1 and S2. 2+ symmetric peripheral pulses. Abd: soft/NT/ND; nabs x 4 quad Extrem: Nml bulk; no cyanosis, clubbing, or edema.  Neuro: *MS: A&O x4. Follows multi-step commands.  *Speech: fluid, mildly dysarthric, able to name  and repeat *CN:    I: Deferred   II,III: PERRLA, VFF by confrontation, optic discs unable to be visualized 2/2 pupillary constriction   III,IV,VI: EOMI w/o nystagmus, no ptosis   V: Sensation intact from V1 to V3 to LT   VII: Eyelid closure was full.  L NLF flattening   VIII: Hearing intact to voice   IX,X: Voice normal, palate elevates symmetrically    XI: SCM/trap 5/5 bilat   XII: Tongue protrudes midline, no atrophy or fasciculations  *Motor:   Normal bulk.  No tremor, rigidity or bradykinesia. No drift in any extremity    Strength: Dlt Bic Tri WrE WrF FgS Gr HF KnF KnE PlF DoF    Left 5 4+ 4 5 5 5  4+ 5 5 5 5 5     Right 5 5 5 5 5 5 5 5 5 5 5 5     *Sensory: Intact to light touch, pinprick, temperature vibration throughout. Symmetric. Propioception intact bilat.  No double-simultaneous extinction.  *Coordination:  Finger-to-nose, heel-to-shin, rapid alternating motions were intact. *Reflexes:  2+ and symmetric throughout without clonus; toes down-going bilat *Gait: normal base, normal stride, normal turn. Negative Romberg.  NIHSS = 2 for facial droop and dysarthria   Premorbid mRS = 1   Labs   CBC:  Recent Labs  Lab 11/24/21 1254  WBC 9.1  NEUTROABS 7.1  HGB 12.1*  HCT 39.5  MCV 83.3  PLT [id number]    Basic Metabolic Panel:  Lab Results  Component Value Date   NA 139 11/24/2021   K 4.4 11/24/2021   CO2 26 11/24/2021   GLUCOSE 117 (H) 11/24/2021   BUN 22 11/24/2021   CREATININE 1.27 (H) 11/24/2021   CALCIUM 9.0 11/24/2021   GFRNONAA 58 (L) 11/24/2021   GFRAA 69 06/16/2020   Lipid Panel:  Lab Results  Component Value Date   LDLCALC 70 11/12/2020   HgbA1c:  Lab Results  Component Value Date   HGBA1C 6.0 (H) 11/25/2020   Urine Drug Screen: No results found for: "LABOPIA", "COCAINSCRNUR", "LABBENZ", "AMPHETMU", "THCU", "LABBARB"  Alcohol Level     Component  Value Date/Time   ETH <10 11/24/2021 1254     Impression   76 year old gentleman with past  medical history significant for heart failure with reduced ejection fraction, CAD, hyperlipidemia, CKD who presented after an episode of mild left-sided weakness and dysarthria this morning, now nearly resolved. CTA H&N shows severe multifocal stenosis. I will reach out to neuro IR regarding his R ICA stenosis but this does not require emergent intervention given near resolution of his sx. OK to admit to [facility] for stroke workup.   Recommendations   - Admit for stroke workup - Permissive HTN x48 hrs from sx onset or until stroke ruled out by MRI goal BP <220/110. PRN labetalol or hydralazine if BP above these parameters. Avoid oral antihypertensives. - MRI brain wo contrast - TTE - Check A1c and LDL + add statin per guidelines - ASA 81mg  daily + plavix 75mg  daily x90 days f/b plavix 75mg  daily monotherapy after that - q4 hr neuro checks - STAT head CT for any change in neuro exam - Tele - PT/OT/SLP - Stroke education - Amb referral to neurology upon discharge  - Will continue to follow  ______________________________________________________________________   Thank you for the opportunity to take part in the care of this patient. If you have any further questions, please contact the neurology consultation attending.  Signed,  Su Monks, MD [facility] [phone]  If 7pm- 7am, please page neurology on call as listed in [place].  **Any copied and pasted documentation in this note was written by me in another application not billed for and pasted by me into this document.

## 2021-11-24 NOTE — ED Notes (Signed)
This RN discussed diet changes with pt to reduce risk for strokes

## 2021-11-24 NOTE — ED Provider Triage Note (Signed)
Emergency Medicine Provider Triage Evaluation Note  HUNTINGTON LEVERICH , a 76 y.o. male  was evaluated in triage.  Pt complains of slurred speech, L arm weakness and sensation loss, LKN 10a.  Review of Systems  Positive: Dysarthria, L arm weak and numb, HA Negative: Fever, chest pain, dyspnea  Physical Exam  Pulse 72   Resp 16   Ht 5\' 9"  (1.753 m)   Wt 86.2 kg   SpO2 96%   BMI 28.06 kg/m  Gen:   Awake, no distress   Resp:  Normal effort  Other:  Slight LUE pronator drift, sensation decreased, dysarthria  per pt and daugtehr  Medical Decision Making  Medically screening exam initiated at 12:48 PM.  Appropriate orders placed.  Francella Solian was informed that the remainder of the evaluation will be completed by another provider, this initial triage assessment does not replace that evaluation, and the importance of remaining in the ED until their evaluation is complete.  Stroke code called. Orders placed.    Lucillie Garfinkel, MD 11/24/21 1250

## 2021-11-24 NOTE — ED Notes (Signed)
Pt in CT, stroke button pushed in room around 1300. Dr Quinn Axe in [place] with pt per stroke RN

## 2021-11-24 NOTE — ED Notes (Signed)
CBG 123 

## 2021-11-24 NOTE — Progress Notes (Signed)
   11/24/21 1300  Clinical Encounter Type  Visited With Health care provider  Visit Type Code  Referral From Physician  Consult/Referral To Chaplain   Chaplain responded to Code Stroke. Patient at [place]. No family present. Chaplain informed nurse to call if Chaplain support needed.

## 2021-11-24 NOTE — ED Notes (Signed)
Pt with mildly decreased strength on left hand grip and left leg strength compared to right, no drift in any limb

## 2021-11-24 NOTE — ED Provider Notes (Signed)
[facility] Provider Note    Event Date/Time   First MD Initiated Contact with Patient 11/24/21 1312     (approximate)   History   Chief Complaint: Slurred speech  HPI  Benjamin Valentine is a 76 y.o. male with a history of hypertension, gout, heart failure, CKD who was sent to the ED due to slurred speech and facial droop today starting around 11:00 AM.  Code stroke initiated on arrival.  Patient denies fever vomiting trauma chest pain or other acute complaints.     Physical Exam   Triage Vital Signs: ED Triage Vitals  Enc Vitals Group     BP 11/24/21 1248 (!) 150/93     Pulse Rate 11/24/21 1244 72     Resp 11/24/21 1244 16     Temp 11/24/21 1248 98.3 F (36.8 C)     Temp Source 11/24/21 1248 Oral     SpO2 11/24/21 1244 96 %     Weight 11/24/21 1245 190 lb (86.2 kg)     Height 11/24/21 1245 5\' 9"  (1.753 m)     Head Circumference --      Peak Flow --      Pain Score 11/24/21 1245 0     Pain Loc --      Pain Edu? --      Excl. in GC? --     Most recent vital signs: Vitals:   11/24/21 1244 11/24/21 1248  BP:  (!) 150/93  Pulse: 72   Resp: 16   Temp:  98.3 F (36.8 C)  SpO2: 96%     General: Awake, no distress.  CV:  Good peripheral perfusion.  Resp:  Normal effort.  Abd:  No distention.  Other:  Mild dysarthria, stroke scale of 1.   ED Results / Procedures / Treatments   Labs (all labs ordered are listed, but only abnormal results are displayed) Labs Reviewed  CBC - Abnormal; Notable for the following components:      Result Value   Hemoglobin 12.1 (*)    MCH 25.5 (*)    RDW 16.2 (*)    All other components within normal limits  CBG MONITORING, ED - Abnormal; Notable for the following components:   Glucose-Capillary 123 (*)    All other components within normal limits  DIFFERENTIAL  ETHANOL  PROTIME-INR  APTT  COMPREHENSIVE METABOLIC PANEL  URINE DRUG SCREEN, QUALITATIVE ([facility] ONLY)  URINALYSIS, ROUTINE W REFLEX  MICROSCOPIC     EKG Interpreted by me Sinus rhythm rate of 73.  Normal axis, normal intervals.  Poor R wave progression.  Normal ST segments and T waves.  LVH.   RADIOLOGY CT head interpreted by me, negative for mass or intracranial hemorrhage.  No dense infarct.  Radiology report reviewed.  CT angio head and neck negative for LVO.  MRI brain shows acute infarct in frontal cortex  PROCEDURES:  Procedures   MEDICATIONS ORDERED IN ED: Medications  iohexol (OMNIPAQUE) 350 MG/ML injection 75 mL (75 mLs Intravenous Contrast Given 11/24/21 1321)     IMPRESSION / MDM / ASSESSMENT AND PLAN / ED COURSE  I reviewed the triage vital signs and the nursing notes.                              Differential diagnosis includes, but is not limited to, intracranial hemorrhage, ischemic stroke, electrolyte abnormality, dehydration  Patient's presentation is most consistent with acute presentation  with potential threat to life or bodily function.  Patient presents with acute neurologic deficits, resolved on arrival, low NIH, not a candidate for TNK.  Discussed with neurology who recommends hospitalization for further stroke work-up.  MRI is positive for acute infarct.       FINAL CLINICAL IMPRESSION(S) / ED DIAGNOSES   Final diagnoses:  Cerebrovascular accident (CVA), unspecified mechanism ([place])     Rx / DC Orders   ED Discharge Orders     None        Note:  This document was prepared using Dragon voice recognition software and may include unintentional dictation errors.   Carrie Mew, MD 11/24/21 [phone]

## 2021-11-24 NOTE — ED Notes (Signed)
Initial stroke scale done in CT per stroke RN

## 2021-11-24 NOTE — ED Notes (Signed)
Patient transported to MRI 

## 2021-11-24 NOTE — ED Notes (Signed)
Called to [employer]  Per Charge RN Angela/Activated Code stroke/Rep [employer]

## 2021-11-24 NOTE — Progress Notes (Signed)
Telestroke RN Note:  Elert 1301 - pt on the way to CT at time of elert LNW 1100  Neuro paged 1302 Dr. Quinn Axe in CT with patient 1304 No TNK 1320 per Dr. Quinn Axe

## 2021-11-24 NOTE — Code Documentation (Signed)
Stroke Response Nurse Documentation Code Documentation  Benjamin Valentine is a 76 y.o. male arriving to [facility] via [place] on 11/24/2021 with past medical hx of HTN, CAD, HFrEF, ischemic cardiomyopathy, HLD, and CKD. On No antithrombotic. Code stroke was activated by ED.   Patient from home where he was LKW at 1100 and now complaining of a near syncopal episode. Patient brought in by EMS after an episode of  slurred speech, left sided weakness, and slurred speech this morning.   Stroke team met patient in CT. NIHSS 2, see documentation for details and code stroke times. Patient with left facial droop and dysarthria  on exam. The following imaging was completed:  CT Head. Patient is not a candidate for IV Thrombolytic due to too mild to treat, per MD. Patient is not a candidate for IR due to no LVO on imaging, per MD.   Care Plan: q41mn NIHSS+ VS until outside window (1530.) followed by routine q2h.  Bedside handoff with ED RN MGwenette Greet    KCharise Carwin Stroke Response RN

## 2021-11-25 ENCOUNTER — Observation Stay: Admit: 2021-11-25 | Payer: Medicare HMO

## 2021-11-25 DIAGNOSIS — R2990 Unspecified symptoms and signs involving the nervous system: Secondary | ICD-10-CM | POA: Diagnosis not present

## 2021-11-25 DIAGNOSIS — I639 Cerebral infarction, unspecified: Secondary | ICD-10-CM | POA: Diagnosis not present

## 2021-11-25 LAB — LIPID PANEL
Cholesterol: 112 mg/dL (ref 0–200)
HDL: 26 mg/dL — ABNORMAL LOW (ref >40)
LDL Cholesterol: 64 mg/dL (ref 0–99)
Total CHOL/HDL Ratio: 4.3 RATIO
Triglycerides: 108 mg/dL (ref <150)
VLDL: 22 mg/dL (ref 0–40)

## 2021-11-25 LAB — CBC
HCT: 38.1 % — ABNORMAL LOW (ref 39.0–52.0)
Hemoglobin: 11.7 g/dL — ABNORMAL LOW (ref 13.0–17.0)
MCH: 25.2 pg — ABNORMAL LOW (ref 26.0–34.0)
MCHC: 30.7 g/dL (ref 30.0–36.0)
MCV: 81.9 fL (ref 80.0–100.0)
Platelets: 342 10*3/uL (ref 150–400)
RBC: 4.65 MIL/uL (ref 4.22–5.81)
RDW: 16.4 % — ABNORMAL HIGH (ref 11.5–15.5)
WBC: 7.5 10*3/uL (ref 4.0–10.5)
nRBC: 0 % (ref 0.0–0.2)

## 2021-11-25 LAB — BASIC METABOLIC PANEL
Anion gap: 8 (ref 5–15)
BUN: 20 mg/dL (ref 8–23)
CO2: 24 mmol/L (ref 22–32)
Calcium: 8.9 mg/dL (ref 8.9–10.3)
Chloride: 109 mmol/L (ref 98–111)
Creatinine, Ser: 1.25 mg/dL — ABNORMAL HIGH (ref 0.61–1.24)
GFR, Estimated: 59 mL/min — ABNORMAL LOW (ref >60)
Glucose, Bld: 102 mg/dL — ABNORMAL HIGH (ref 70–99)
Potassium: 3.7 mmol/L (ref 3.5–5.1)
Sodium: 141 mmol/L (ref 135–145)

## 2021-11-25 LAB — MAGNESIUM: Magnesium: 2.1 mg/dL (ref 1.7–2.4)

## 2021-11-25 MED ORDER — CLOPIDOGREL BISULFATE 75 MG PO TABS: 75.0000 mg | ORAL_TABLET | Freq: Every day | ORAL | 2 refills | Status: AC

## 2021-11-25 MED ORDER — ASPIRIN 81 MG PO TBEC: 81.0000 mg | DELAYED_RELEASE_TABLET | Freq: Every day | ORAL | Status: AC

## 2021-11-25 NOTE — Discharge Summary (Signed)
Physician Discharge Summary   Benjamin Valentine  male DOB: Sep 15, 1945  YQM:[id number]  PCP: Corky Downs, MD  Admit date: 11/24/2021 Discharge date: 11/25/2021  Admitted From: home Disposition:  home CODE STATUS: Full code  Discharge Instructions     Ambulatory referral to Neurology   Complete by: As directed    Diet - low sodium heart healthy   Complete by: As directed    Discharge instructions   Complete by: As directed    You did have strokes.  Neurology recommends taking aspirin 81 mg and plavix together for 90 days, and after that, just Plavix daily by itself.    You were on a cholesterol medication Lipitor before, continue to take it.  You will see Dr. Kerby Nora as outpatient to discuss possible procedure for the blockages in your brain arteries.   Dr. Darlin Priestly [facility] Course:  For full details, please see H&P, progress notes, consult notes and ancillary notes.  Briefly,  Benjamin Valentine is a 76 y.o. male with past medical history significant for history of  CAD s/p bypass surgery in 2022,  HTN, HLD, HFrEF, CKD 3a who presented after an episode of mild left-sided weakness and dysarthria morning of presentation.  Acute infarct --MRI brain showed acute infract in the right frontal and right parietal lobe.  --CTA H&N showed severe multifocal stenosis. Neuro to reach out to neuro IR regarding his R ICA stenosis but this does not require emergent intervention given near resolution of his sx, per Dr. Selina Cooley. --LDL 64, pt continued on home Lipitor 40 mg daily - ASA 81mg  daily + plavix 75mg  daily x90 days f/b plavix 75mg  daily monotherapy after that. --follow up with IR Dr. as outpatient regarding R ICA stenosis.  Chronic systolic CHF Ischemic cardiomyopathy --LVEF 25-30% in Sept 2022.   --hold coreg, Entresto, Lasix, hydralazine and Imdur for permissive HTN, to be resumed after discharge. --Pt was taking spironolactone PTA.   CAD with  hx of CABG --cont ASA and statin   HTN --hold coreg, Entresto, Lasix, hydralazine and Imdur for permissive HTN, to be resumed after discharge. --Pt was taking spironolactone PTA.   CKD 3a --Cr 1.27, at baseline.   Discharge Diagnoses:  Principal Problem:   Stroke-like symptoms     Discharge Instructions:  Allergies as of 11/25/2021   No Known Allergies      Medication List     STOP taking these medications    spironolactone 25 MG tablet Commonly known as: ALDACTONE       TAKE these medications    allopurinol 100 MG tablet Commonly known as: ZYLOPRIM Take 1 tablet (100 mg total) by mouth daily.   aspirin EC 81 MG tablet Take 1 tablet (81 mg total) by mouth daily. Swallow whole.   atorvastatin 40 MG tablet Commonly known as: LIPITOR TAKE 1 TABLET BY MOUTH EVERY DAY   carvedilol 3.125 MG tablet Commonly known as: COREG TAKE 1 TABLET (3.125 MG TOTAL) BY MOUTH 2 (TWO) TIMES DAILY WITH A MEAL.   clopidogrel 75 MG tablet Commonly known as: PLAVIX Take 1 tablet (75 mg total) by mouth daily.   colchicine 0.6 MG tablet Take 2 tabs p.o. x1 then take 1 tab p.o. 1 hour later for gout attack.   dapagliflozin propanediol 10 MG Tabs tablet Commonly known as: Farxiga Take 1 tablet (10 mg total) by mouth daily.   furosemide 20 MG tablet Commonly known as: LASIX Take  1 tablet (20 mg total) by mouth daily as needed (As needed for weight gain of 2 pounds overnight).   hydrALAZINE 50 MG tablet Commonly known as: APRESOLINE TAKE 1 TABLET BY MOUTH THREE TIMES A DAY   isosorbide mononitrate 30 MG 24 hr tablet Commonly known as: IMDUR Take 1 tablet (30 mg total) by mouth daily.   sacubitril-valsartan 24-26 MG Commonly known as: ENTRESTO Take 1 tablet by mouth 2 (two) times daily.   Sphygmomanometer Misc 1 each by Does not apply route daily.         Follow-up Information     Julieanne Cotton, MD Follow up.   Specialties: Interventional Radiology,  Radiology Contact information: [address] [place] [address] [phone]         Corky Downs, MD Follow up in 1 week(s).   Specialties: Internal Medicine, Cardiology Contact information: [address] [place] [address] [phone]                 No Known Allergies   The results of significant diagnostics from this hospitalization (including imaging, microbiology, ancillary and laboratory) are listed below for reference.   Consultations:   Procedures/Studies: MR BRAIN WO CONTRAST  Result Date: 11/24/2021 CLINICAL DATA:  Stroke code EXAM: MRI HEAD WITHOUT CONTRAST TECHNIQUE: Multiplanar, multiecho pulse sequences of the brain and surrounding structures were obtained without intravenous contrast. COMPARISON:  Same-day CT head, prior CT head dated 09/28/2015, MRI Brain dated 11/25/20. FINDINGS: Brain: Small linear acute infarct in the right frontal lobe (series 5, image 33) an additional punctate focus in the right parietal lobe (series 5, image 34). There are a few scattered foci of microhemorrhage, for example along the left lentiform nucleus (series 13, image 31), the upper pons (series 13, image 23), the frontal operculum (series 13, image 33). These are unchanged from prior. There is sequela of moderate chronic microvascular ischemic change. No extra-axial fluid collection. No hydrocephalus. Vascular: Normal flow voids. Skull and upper cervical spine: Normal marrow signal. Sinuses/Orbits: Left maxillary sinus is small in size with associated mucosal thickening. Other: None. IMPRESSION: Small linear region of acute infarct in the right frontal lobe and additional punctate focus of acute infarct in the right parietal lobe. Electronically Signed   By: Lorenza Cambridge M.D.   On: 11/24/2021 16:40   CT ANGIO HEAD NECK W WO CM  Result Date: 11/24/2021 CLINICAL DATA:  Neuro deficit, acute, stroke suspected L side weak CT head EXAM: CT ANGIOGRAPHY HEAD  AND NECK TECHNIQUE: Multidetector CT imaging of the head and neck was performed using the standard protocol during bolus administration of intravenous contrast. Multiplanar CT image reconstructions and MIPs were obtained to evaluate the vascular anatomy. Carotid stenosis measurements (when applicable) are obtained utilizing NASCET criteria, using the distal internal carotid diameter as the denominator. RADIATION DOSE REDUCTION: This exam was performed according to the departmental dose-optimization program which includes automated exposure control, adjustment of the mA and/or kV according to patient size and/or use of iterative reconstruction technique. CONTRAST:  68mL OMNIPAQUE IOHEXOL 350 MG/ML SOLN COMPARISON:  From the same day. FINDINGS: CTA NECK FINDINGS Aortic arch: Great vessel origins are patent without significant stenosis. Right carotid system: Approximately 40% stenosis of the proximal ICA due to atherosclerosis. Left carotid system: Approximately 50% stenosis of the common carotid artery due to eccentric plaque. Atherosclerosis at the carotid bifurcation without greater than 50% stenosis. Vertebral arteries: Severe stenosis of the non dominant right vertebral artery origin with intermittent non opacification  throughout the neck and non opacification intradurally. Severe stenosis of the proximal left V2 vertebral artery with more distal opacification. Skeleton: No acute findings.  Degenerative changes. Other neck: No acute findings.  Multilevel degenerative change. Upper chest: Visualized lung apices are clear. Review of the MIP images confirms the above findings CTA HEAD FINDINGS Anterior circulation: Severe stenosis of the distal petrous right ICA. Moderate stenosis of the right paraclinoid ICA. Bilateral MCAs are patent with multifocal mild-to-moderate stenosis. Hypoplastic right A1 ACA with probable superimposed severe stenosis. Otherwise, ACAs are patent with moderate distal ACA stenosis. Posterior  circulation: Non-opacified right intradural vertebral artery. Severe stenosis of the left intradural vertebral artery. Mild stenosis of the basilar artery. Severe stenosis of bilateral P2 PCAs. Venous sinuses: As permitted by contrast timing, patent. Review of the MIP images confirms the above findings IMPRESSION: 1. Severe stenosis of the non dominant right vertebral artery origin with intermittent non opacification throughout the neck and non opacification intradurally. 2. Severe stenosis of the proximal left V2 vertebral artery with more distal opacification. Also, severe stenosis of the intradural left vertebral artery. 3. Severe stenosis of the distal petrous right ICA and moderate stenosis of the right paraclinoid ICA. 4. Severe stenosis of bilateral P2 PCAs. 5. Hypoplastic right A1 ACA with probable superimposed severe stenosis. 6. Moderate distal ACA stenosis. 7. Approximately 40% stenosis of the proximal right ICA in the neck. 8. Approximately 50% stenosis of the left common carotid artery. Findings discussed with Dr. Quinn Axe via telephone at 1:38 p.m. Electronically Signed   By: Margaretha Sheffield M.D.   On: 11/24/2021 13:40   CT HEAD CODE STROKE WO CONTRAST  Addendum Date: 11/24/2021   ADDENDUM REPORT: 11/24/2021 13:14 ADDENDUM: Code stroke imaging results were communicated on 11/24/2021 at 1:14 pm to provider Quinn Axe via secure text message Electronically Signed   By: Franchot Gallo M.D.   On: 11/24/2021 13:14   Result Date: 11/24/2021 CLINICAL DATA:  Code stroke. Acute neuro deficit rule out stroke. Slurred speech and facial droop EXAM: CT HEAD WITHOUT CONTRAST TECHNIQUE: Contiguous axial images were obtained from the base of the skull through the vertex without intravenous contrast. RADIATION DOSE REDUCTION: This exam was performed according to the departmental dose-optimization program which includes automated exposure control, adjustment of the mA and/or kV according to patient size and/or use of  iterative reconstruction technique. COMPARISON:  MRI head 11/25/2020 FINDINGS: Brain: Ventricle size and cerebral volume normal for age. Mild patchy white matter hypodensity bilaterally as noted on MRI. Negative for acute infarct, hemorrhage, mass Vascular: Negative for hyperdense vessel. Atherosclerotic calcification in the cavernous carotid bilaterally. Atherosclerotic calcification right M2 branch in the sylvian fissure. Skull: Negative Sinuses/Orbits: Mucosal edema and bony thickening left maxillary sinus. Mild mucosal edema right maxillary sinus. Negative orbit Other: None ASPECTS ([place] Stroke Program Early CT Score) - Ganglionic level infarction (caudate, lentiform nuclei, internal capsule, insula, M1-M3 cortex): 7 - Supraganglionic infarction (M4-M6 cortex): 3 Total score (0-10 with 10 being normal): 10 IMPRESSION: 1. No acute intracranial abnormality. Mild chronic microvascular ischemic change in the white matter. 2. Aspects is 10. Electronically Signed: By: Franchot Gallo M.D. On: 11/24/2021 13:10      Labs: BNP (last 3 results) No results for input(s): "BNP" in the last 8760 hours. Basic Metabolic Panel: Recent Labs  Lab 11/24/21 1254 11/25/21 0745  NA 139 141  K 4.4 3.7  CL 108 109  CO2 26 24  GLUCOSE 117* 102*  BUN 22 20  CREATININE 1.27* 1.25*  CALCIUM 9.0 8.9  MG  --  2.1   Liver Function Tests: Recent Labs  Lab 11/24/21 1254  AST 13*  ALT 12  ALKPHOS 46  BILITOT 0.5  PROT 7.8  ALBUMIN 3.9   No results for input(s): "LIPASE", "AMYLASE" in the last 168 hours. No results for input(s): "AMMONIA" in the last 168 hours. CBC: Recent Labs  Lab 11/24/21 1254 11/25/21 0745  WBC 9.1 7.5  NEUTROABS 7.1  --   HGB 12.1* 11.7*  HCT 39.5 38.1*  MCV 83.3 81.9  PLT 373 342   Cardiac Enzymes: No results for input(s): "CKTOTAL", "CKMB", "CKMBINDEX", "TROPONINI" in the last 168 hours. BNP: Invalid input(s): "POCBNP" CBG: Recent Labs  Lab 11/24/21 1250  GLUCAP 123*    D-Dimer No results for input(s): "DDIMER" in the last 72 hours. Hgb A1c Recent Labs    11/24/21 1254  HGBA1C 6.4*   Lipid Profile Recent Labs    11/25/21 0745  CHOL 112  HDL 26*  LDLCALC 64  TRIG 326  CHOLHDL 4.3   Thyroid function studies No results for input(s): "TSH", "T4TOTAL", "T3FREE", "THYROIDAB" in the last 72 hours.  Invalid input(s): "FREET3" Anemia work up No results for input(s): "VITAMINB12", "FOLATE", "FERRITIN", "TIBC", "IRON", "RETICCTPCT" in the last 72 hours. Urinalysis    Component Value Date/Time   COLORURINE STRAW (A) 11/24/2021 1308   APPEARANCEUR CLEAR (A) 11/24/2021 1308   LABSPEC 1.023 11/24/2021 1308   PHURINE 6.0 11/24/2021 1308   GLUCOSEU >=500 (A) 11/24/2021 1308   HGBUR NEGATIVE 11/24/2021 1308   BILIRUBINUR NEGATIVE 11/24/2021 1308   KETONESUR NEGATIVE 11/24/2021 1308   PROTEINUR NEGATIVE 11/24/2021 1308   NITRITE NEGATIVE 11/24/2021 1308   LEUKOCYTESUR NEGATIVE 11/24/2021 1308   Sepsis Labs Recent Labs  Lab 11/24/21 1254 11/25/21 0745  WBC 9.1 7.5   Microbiology No results found for this or any previous visit (from the past 240 hour(s)).   Total time spend on discharging this patient, including the last patient exam, discussing the hospital stay, instructions for ongoing care as it relates to all pertinent caregivers, as well as preparing the medical discharge records, prescriptions, and/or referrals as applicable, is 35 minutes.    Darlin Priestly, MD  [facility] 11/25/2021, 9:10 AM

## 2021-11-25 NOTE — Evaluation (Signed)
Physical Therapy Evaluation Patient Details Name: Benjamin Valentine MRN: [id number] DOB: November 21, 1945 Today's Date: 11/25/2021  History of Present Illness  Pt admitted to [facility] on 11/24/21 under observation for c/o stroke like symptoms including: slurred speech, facial droop, and L sided weakness/paresthesias. Not a TPA candidate and imaging positive for acute infarct in R frontal and parietal lobe, as well as severe multifocal stenosis. Significant PMH includes: HTN, gout, HFrEF, CKD, CAD, ischemic cardiomyopathy, and HLD.   Clinical Impression  Pt is a 76 year old M admitted to hospital on 11/24/21 for stroke-like symptoms. At baseline, pt is completely Ind with ADL's, IADL's, community ambulation without AD, medication management, and driving. Pt presents with mild LUE strength deficits and HTN; otherwise pt with adequate strength, balance, endurance, and safety awareness required for Ind functional mobility. HTN in sitting noted to be at 192/104 mmHg, however, this is within guidelines of permissive HTN for up to first 48hrs; pt asymptomatic. Pt was grossly Ind for bed mobility, transfers, and gait without AD, noting that his current functional mobility is at baseline. Due to pt being at baseline he is not an appropriate candidate for skilled acute PT services at this time, therefore, PT to sign off. Recommending DC home with no further PT/DME needs. Pt agreeable.        Recommendations for follow up therapy are one component of a multi-disciplinary discharge planning process, led by the attending physician.  Recommendations may be updated based on patient status, additional functional criteria and insurance authorization.  Follow Up Recommendations No PT follow up      Assistance Recommended at Discharge PRN     Equipment Recommendations None recommended by PT        Precautions / Restrictions Precautions Precaution Comments: permissive HTN (first 48hrs) </= 220/110 per  neurology Restrictions Weight Bearing Restrictions: No      Mobility  Bed Mobility Overal bed mobility: Independent             General bed mobility comments: HOB elevated    Transfers Overall transfer level: Independent Equipment used: None               General transfer comment: STS from EOB without AD, UE for support    Ambulation/Gait Ambulation/Gait assistance: Supervision, Independent Gait Distance (Feet): 200 Feet Assistive device: None         General Gait Details: Supervision provided for safety and line management, however, pt able to be Ind with mobility. Demonstrates reciprocal gait pattern with adequate foot clearance and step length. Proprioception tested and intact, however, pt ambulates very close to obstacles in hallway. Reports balance/gait are at baseline. No gait deficits noted with vertical/horizontal head turns and 180deg turns noted.       Balance Overall balance assessment: Independent                                           Pertinent Vitals/Pain Pain Assessment Pain Assessment: No/denies pain    Home Living Family/patient expects to be discharged to:: Private residence Living Arrangements: Children (Son/DIL) Available Help at Discharge: Family;Available PRN/intermittently Type of Home: House Home Access: Stairs to enter Entrance Stairs-Rails: Right Entrance Stairs-Number of Steps: 3   Home Layout: One level Home Equipment: Cane - single Barista (2 wheels) Additional Comments: reports no DME at home; per chart review pt has SPC and RW    Prior  Function Prior Level of Function : Independent/Modified Independent;Driving             Mobility Comments: Ind with community ambulation without AD. Enjoys doing "drag racing" ADLs Comments: Ind ADL's and IADL's     Hand Dominance   Dominant Hand: Right    Extremity/Trunk Assessment   Upper Extremity Assessment Upper Extremity Assessment:  LUE deficits/detail LUE Deficits / Details: 4/5 strength for shoulder flexion and elbow flexion; otherwise grossly 5/5. Superficial, deep, and combined cortical sensations intact LUE Sensation: WNL    Lower Extremity Assessment Lower Extremity Assessment: Overall WFL for tasks assessed (grossly 5/5; coordination and sensation intact)    Cervical / Trunk Assessment Cervical / Trunk Assessment: Normal  Communication   Communication: No difficulties  Cognition Arousal/Alertness: Awake/alert Behavior During Therapy: WFL for tasks assessed/performed Overall Cognitive Status: Within Functional Limits for tasks assessed                                 General Comments: A&O x4; able to follow 100% of multi-step commands        General Comments General comments (skin integrity, edema, etc.): BP elevated to 192/104 mmHg while seated EOB; asymptomatic    Exercises Other Exercises Other Exercises: Participates in bed mobility, transfers, and gait. Grossly Ind. Other Exercises: Pt educated re: PT role/POC, DC recommendations, ambulation in hospital, BP readings.   Assessment/Plan    PT Assessment Patient does not need any further PT services  PT Problem List Decreased strength           PT Goals (Current goals can be found in the Care Plan section)  Acute Rehab PT Goals Patient Stated Goal: "go home" PT Goal Formulation: With patient Time For Goal Achievement: 12/09/21 Potential to Achieve Goals: Good     AM-PAC PT "6 Clicks" Mobility  Outcome Measure Help needed turning from your back to your side while in a flat bed without using bedrails?: None Help needed moving from lying on your back to sitting on the side of a flat bed without using bedrails?: None Help needed moving to and from a bed to a chair (including a wheelchair)?: None Help needed standing up from a chair using your arms (e.g., wheelchair or bedside chair)?: None Help needed to walk in hospital  room?: None Help needed climbing 3-5 steps with a railing? : None 6 Click Score: 24    End of Session Equipment Utilized During Treatment: Gait belt Activity Tolerance: Patient tolerated treatment well Patient left: in bed (seated EOB; left in care of MD) Nurse Communication: Mobility status PT Visit Diagnosis: Muscle weakness (generalized) (M62.81)    Time: 3570-1779 PT Time Calculation (min) (ACUTE ONLY): 20 min   Charges:   PT Evaluation $PT Eval Low Complexity: 1 Low         Vira Blanco, PT, DPT 9:36 AM,11/25/21 Physical Therapist - [facility]

## 2021-11-25 NOTE — Evaluation (Signed)
Speech Language Pathology Evaluation Patient Details Name: Benjamin Valentine MRN: [id number] DOB: 01-24-1946 Today's Date: 11/25/2021 Time: [id number] SLP Time Calculation (min) (ACUTE ONLY): 45 min  Problem List:  Patient Active Problem List   Diagnosis Date Noted   Stroke-like symptoms 11/24/2021   Hyperlipidemia 05/01/2021   Class 1 obesity without serious comorbidity with body mass index (BMI) of 31.0 to 31.9 in adult 04/27/2021   TIA (transient ischemic attack) 11/25/2020   Coronary artery disease involving native coronary artery of native heart without angina pectoris    Acute HFrEF (heart failure with reduced ejection fraction) (HCC)    Stage 3a chronic kidney disease ([place]) 11/11/2020   Hx of CABG 11/11/2020   Acute dyspnea 11/11/2020   Elevated troponin 11/11/2020   Gout 11/11/2020   Abdominal distention 11/11/2020   Hypertensive urgency 11/11/2020   Dyspnea 11/11/2020   Acute on chronic congestive heart failure (HCC)    AKI (acute kidney injury) ([place])    Chronic gout due to renal impairment involving foot without tophus 04/13/2020   Primary osteoarthritis of both ankles 04/13/2020   Left groin hernia 01/26/2020   Coronary artery disease, non-occlusive 01/26/2020   Hip pain 01/26/2020   Essential hypertension 01/26/2020   Acute appendicitis 08/11/2018   Past Medical History:  Past Medical History:  Diagnosis Date   Arthritis    Chronic HFrEF (heart failure with reduced ejection fraction) ([place])    a. 10/2020 Echo: EF 25-30%, nl RV fxn, mild MR.   CKD (chronic kidney disease), stage III ([place])    Coronary artery disease    a. 2002 s/p CABG x 2: LIMA->LAD, VG->RPDA; b. 11/2020 Cath: LM 20ost, LAD 128m, LCX 99p/m, 41m/d, OM1 30, RCA 100p - fills via collats from LAD. RPL1 fills via collats from LCX. VG->PDA 100, LIMA->LAD ok-->med rx.   Gout    Hyperlipidemia LDL goal <70    Hypertension    Ischemic cardiomyopathy    a. 10/2020 Echo: EF 25-30%.   Osteoarthritis    Past  Surgical History:  Past Surgical History:  Procedure Laterality Date   CARDIAC SURGERY     CABG 2002   LAPAROSCOPIC APPENDECTOMY N/A 08/11/2018   Procedure: APPENDECTOMY LAPAROSCOPIC;  Surgeon: Olean Ree, MD;  Location: [facility] ORS;  Service: General;  Laterality: N/A;   RIGHT/LEFT HEART CATH AND CORONARY/GRAFT ANGIOGRAPHY N/A 11/15/2020   Procedure: RIGHT/LEFT HEART CATH AND CORONARY/GRAFT ANGIOGRAPHY;  Surgeon: Wellington Hampshire, MD;  Location: [facility];  Service: Cardiovascular;  Laterality: N/A;   HPI:  Pt is a 76 year old gentleman with past medical history significant for heart failure with reduced ejection fraction, CAD, hyperlipidemia, CKD who presented after an episode of mild left-sided weakness and dysarthria this morning.  Last known well was 1100.  He then developed a left facial droop and some very mild left grip weakness on the left and family noted that he was dysarthric at that time.  He remains dysarthric although edentulous and patient's family says that he is currently speaking as he usually does.  CTA head and neck showed no LVO but did show multifocal severe stenoses:     1. Severe stenosis of the non dominant right vertebral artery origin  with intermittent non opacification throughout the neck and non  opacification intradurally.  2. Severe stenosis of the proximal left V2 vertebral artery with  more distal opacification. Also, severe stenosis of the intradural  left vertebral artery.  3. Severe stenosis of the distal petrous right ICA and moderate  stenosis of the right paraclinoid ICA.  4. Severe stenosis of bilateral P2 PCAs.  5. Hypoplastic right A1 ACA with probable superimposed severe  stenosis.  6. Moderate distal ACA stenosis.  Small linear region of acute infarct in the right frontal lobe and  additional punctate focus of acute infarct in the right parietal  lobe.  CXR: negative.   Assessment / Plan / Recommendation Clinical Impression   Pt seen for  cognitive-linguistic evaluation this morning during breakfast meal. NSG present stated no further oral motor weakness noted during her assessment. Pt stated his speech was at his Baseline. Pt is primarily Edentulous at Baseline.  Pt awake/alert; verbal and quite Pleasant during conversation. On RA, afebrile.   Pt appears to present w/ No Motor Speech deficits, no dysfluency nor overt dysarthria at conversation level - this is consistent w/ what NSG has noted. Family stated by late yesterday PM he appeared at his Baseline again. Due to primarily Edentulous, pt's articulation precision of speech is mildly reduced but he is able to increase intelligibility if needed w/ min increased effort/enunciation.  Pt spoke casually during conversation and was generally fully intelligible. Highlighted environmental influences/distractions during conversation.   No overt expressive nor receptive aphasia noted, No overt cognitive deficits noted -- suspect at his Baseline per his report. OM exam was wfl for lingual/labial strength/ROM; no unilateral weakness noted.   Discussed articulation strategies w/ pt if needed. No further recommendations for any skilled ST services at Discharge. Evaluation completed. NSG/MD updated. Pt agreed.    SLP Assessment  SLP Recommendation/Assessment: Patient does not need any further Speech [place] [facility] SLP Visit Diagnosis: Dysarthria and anarthria (R47.1)    Recommendations for follow up therapy are one component of a multi-disciplinary discharge planning process, led by the attending physician.  Recommendations may be updated based on patient status, additional functional criteria and insurance authorization.    Follow Up Recommendations  No SLP follow up    Assistance Recommended at Discharge  None  Functional Status Assessment Patient has had a recent decline in their functional status and demonstrates the ability to make significant improvements in function in a  reasonable and predictable amount of time.  Frequency and Duration  (n/a)   (n/a)      SLP Evaluation Cognition  Overall Cognitive Status: Within Functional Limits for tasks assessed Arousal/Alertness: Awake/alert Orientation Level: Oriented X4 Year: 2023 Month: October Attention: Focused;Sustained;Alternating Focused Attention: Appears intact Sustained Attention: Appears intact Alternating Attention: Appears intact Memory: Appears intact (to Nursing in room this AM, meds taken) Awareness: Appears intact Problem Solving: Appears intact Executive Function: Reasoning;Decision Making (for general problem situations) Reasoning: Appears intact Decision Making: Appears intact Behaviors:  (none) Safety/Judgment: Appears intact Comments: for general situations around the home       Comprehension  Auditory Comprehension Overall Auditory Comprehension: Appears within functional limits for tasks assessed Yes/No Questions: Within Functional Limits Commands: Within Functional Limits (1-2 step) Conversation: Simple Interfering Components:  (none) Visual Recognition/Discrimination Discrimination: Not tested Reading Comprehension Reading Status: Not tested    Expression Expression Primary Mode of Expression: Verbal Verbal Expression Overall Verbal Expression: Appears within functional limits for tasks assessed Initiation: No impairment Automatic Speech:  (WFL) Level of Generative/Spontaneous Verbalization: Conversation Repetition: No impairment Naming: No impairment Pragmatics: No impairment Interfering Components:  (none) Non-Verbal Means of Communication: Not applicable Written Expression Dominant Hand: Right Written Expression: Not tested   Oral / Motor  Oral Motor/Sensory Function Overall Oral Motor/Sensory Function: Within functional limits (grossly) Motor  Speech Overall Motor Speech: Appears within functional limits for tasks assessed Respiration: Within functional  limits Phonation: Normal Resonance: Within functional limits Articulation: Within functional limitis (at his baseline per pt - primarily edentulous) Intelligibility: Intelligible Motor Planning: Witnin functional limits Motor Speech Errors: Not applicable Interfering Components: Inadequate dentition Effective Techniques: Over-articulate (just a bit if needed)              Orinda Kenner, [place], [place]; [place] [phone] (ascom) Xian Alves 11/25/2021, 8:52 AM

## 2021-11-28 NOTE — Progress Notes (Signed)
Neurology progress note  Patient ID: 75 year old gentleman with past medical history significant for heart failure with reduced ejection fraction, CAD, hyperlipidemia, CKD who presented after an episode of mild left-sided weakness and dysarthria this morning, now nearly resolved. MRI did show a small acute infarct in the R parietal lobe.  Data  MRI brain wo contrast Small linear region of acute infarct in the right frontal lobe and additional punctate focus of acute infarct in the right parietal lobe.  CTA H&N 1. Severe stenosis of the non dominant right vertebral artery origin with intermittent non opacification throughout the neck and non opacification intradurally. 2. Severe stenosis of the proximal left V2 vertebral artery with more distal opacification. Also, severe stenosis of the intradural left vertebral artery. 3. Severe stenosis of the distal petrous right ICA and moderate stenosis of the right paraclinoid ICA. 4. Severe stenosis of bilateral P2 PCAs. 5. Hypoplastic right A1 ACA with probable superimposed severe stenosis. 6. Moderate distal ACA stenosis. 7. Approximately 40% stenosis of the proximal right ICA in the neck. 8. Approximately 50% stenosis of the left common carotid artery.  TTE pending  Stroke Labs     Component Value Date/Time   CHOL 112 11/25/2021 0745   TRIG 108 11/25/2021 0745   HDL 26 (L) 11/25/2021 0745   CHOLHDL 4.3 11/25/2021 0745   VLDL 22 11/25/2021 0745   LDLCALC 64 11/25/2021 0745    Lab Results  Component Value Date/Time   HGBA1C 6.4 (H) 11/24/2021 12:54 PM   O:  Vitals:   11/25/21 0845 11/25/21 0900  BP: (!) 192/104 (!) 169/102  Pulse: 76   Resp: 19 (!) 23  Temp:    SpO2: 96%    Physical Exam Gen: A&O x4, NAD HEENT: Atraumatic, normocephalic;mucous membranes moist; oropharynx clear, tongue without atrophy or fasciculations. Neck: Supple, trachea midline. Resp: CTAB, no w/r/r CV: RRR, no m/g/r; nml S1 and S2. 2+ symmetric  peripheral pulses. Abd: soft/NT/ND; nabs x 4 quad Extrem: Nml bulk; no cyanosis, clubbing, or edema.   Neuro: *MS: A&O x4. Follows multi-step commands.  *Speech: fluid, mildly dysarthric, able to name and repeat *CN:    I: Deferred   II,III: PERRLA, VFF by confrontation, optic discs unable to be visualized 2/2 pupillary constriction   III,IV,VI: EOMI w/o nystagmus, no ptosis   V: Sensation intact from V1 to V3 to LT   VII: Eyelid closure was full.  L NLF flattening   VIII: Hearing intact to voice   IX,X: Voice normal, palate elevates symmetrically    XI: SCM/trap 5/5 bilat   XII: Tongue protrudes midline, no atrophy or fasciculations  *Motor:   Normal bulk.  No tremor, rigidity or bradykinesia. No drift in any extremity     Strength: Dlt Bic Tri WrE WrF FgS Gr HF KnF KnE PlF DoF    Left 5 4+ 4 5 5 5  4+ 5 5 5 5 5     Right 5 5 5 5 5 5 5 5 5 5 5 5       *Sensory: Intact to light touch, pinprick, temperature vibration throughout. Symmetric. Propioception intact bilat.  No double-simultaneous extinction.  *Coordination:  Finger-to-nose, heel-to-shin, rapid alternating motions were intact. *Reflexes:  2+ and symmetric throughout without clonus; toes down-going bilat *Gait: normal base, normal stride, normal turn. Negative Romberg.   NIHSS = 2 for facial droop and dysarthria     Premorbid mRS = 73  A/P: 76 year old gentleman with past medical history significant for heart failure with reduced ejection  fraction, CAD, hyperlipidemia, CKD who presented after an episode of mild left-sided weakness and dysarthria this morning, now nearly resolved. CTA H&N shows severe multifocal stenosis. I reached out to Dr. Juliet Rude who offered to see him in clinic this week regarding his R ICA stenosis (this did not require emergent intervention 2/2 mild sx). Patient and family amenable to referral which was placed. I will route this message to Dr. Estanislado Pandy as well. - Goal normotension - OK to d/c  after TTE results - ASA 81mg  daily + plavix 75mg  daily x90 days f/b plavix 75mg  daily monotherapy after that; further recommendations per neuro IR - Continue atorvastatin 40mg  daily - Amb referral to neurology at discharge  Su Monks, MD [facility] [phone]  If [place], please page neurology on call as listed in [place].

## 2021-12-05 ENCOUNTER — Ambulatory Visit (INDEPENDENT_AMBULATORY_CARE_PROVIDER_SITE_OTHER): Payer: Medicare HMO | Admitting: Internal Medicine

## 2021-12-05 ENCOUNTER — Encounter: Payer: Self-pay | Admitting: Internal Medicine

## 2021-12-05 VITALS — BP 136/73 | HR 68 | Ht 69.0 in | Wt 209.2 lb

## 2021-12-05 DIAGNOSIS — I639 Cerebral infarction, unspecified: Secondary | ICD-10-CM | POA: Insufficient documentation

## 2021-12-05 DIAGNOSIS — I10 Essential (primary) hypertension: Secondary | ICD-10-CM

## 2021-12-05 DIAGNOSIS — Z6831 Body mass index (BMI) 31.0-31.9, adult: Secondary | ICD-10-CM | POA: Diagnosis not present

## 2021-12-05 DIAGNOSIS — E669 Obesity, unspecified: Secondary | ICD-10-CM

## 2021-12-05 DIAGNOSIS — N1831 Chronic kidney disease, stage 3a: Secondary | ICD-10-CM

## 2021-12-05 DIAGNOSIS — R2990 Unspecified symptoms and signs involving the nervous system: Secondary | ICD-10-CM

## 2021-12-05 DIAGNOSIS — I2510 Atherosclerotic heart disease of native coronary artery without angina pectoris: Secondary | ICD-10-CM | POA: Diagnosis not present

## 2021-12-05 NOTE — Assessment & Plan Note (Signed)
Stable at the present time. 

## 2021-12-05 NOTE — Assessment & Plan Note (Signed)
Patient was advised to lose weight 

## 2021-12-05 NOTE — Progress Notes (Signed)
Established Patient Office Visit  Subjective:  Patient ID: Benjamin Valentine, male    DOB: 1946-01-12  Age: 76 y.o. MRN: [id number]  CC: Patient on the right frontal lobe, he was seen in the emergency ER, patient is walking better, his left arm and forearms little bit weaker than the right.  Speech is normal, there is no facial asymmetry, patient denies any chest pain, or fluttering of the heart.  Patient has been advised by the neurologist to take 1 aspirin a day.  He also wanted to be seen in [facility] we will make a referral.   HPI  Benjamin Valentine presents for check up,  Past Medical History:  Diagnosis Date   Arthritis    Chronic HFrEF (heart failure with reduced ejection fraction) ([place])    a. 10/2020 Echo: EF 25-30%, nl RV fxn, mild MR.   CKD (chronic kidney disease), stage III ([place])    Coronary artery disease    a. 2002 s/p CABG x 2: LIMA->LAD, VG->RPDA; b. 11/2020 Cath: LM 20ost, LAD 139m LCX 99p/m, 957m, OM1 30, RCA 100p - fills via collats from LAD. RPL1 fills via collats from LCX. VG->PDA 100, LIMA->LAD ok-->med rx.   Gout    Hyperlipidemia LDL goal <70    Hypertension    Ischemic cardiomyopathy    a. 10/2020 Echo: EF 25-30%.   Osteoarthritis     Past Surgical History:  Procedure Laterality Date   CARDIAC SURGERY     CABG 2002   LAPAROSCOPIC APPENDECTOMY N/A 08/11/2018   Procedure: APPENDECTOMY LAPAROSCOPIC;  Surgeon: PiOlean ReeMD;  Location: [facility] ORS;  Service: General;  Laterality: N/A;   RIGHT/LEFT HEART CATH AND CORONARY/GRAFT ANGIOGRAPHY N/A 11/15/2020   Procedure: RIGHT/LEFT HEART CATH AND CORONARY/GRAFT ANGIOGRAPHY;  Surgeon: ArWellington HampshireMD;  [facility];  Service: Cardiovascular;  Laterality: N/A;    History reviewed. No pertinent family history.  Social History   Socioeconomic History   Marital status: Widowed    Spouse name: Not on file   Number of children: Not on file   Years of education: Not on file   Highest  education level: Not on file  Occupational History   Not on file  Tobacco Use   Smoking status: Former   Smokeless tobacco: Never  Vaping Use   Vaping Use: Never used  Substance and Sexual Activity   Alcohol use: No   Drug use: No   Sexual activity: Not on file  Other Topics Concern   Not on file  Social History Narrative   Not on file   Social Determinants of Health   Financial Resource Strain: Low Risk  (11/25/2020)   Overall Financial Resource Strain (CARDIA)    Difficulty of Paying Living Expenses: Not very hard  Food Insecurity: No Food Insecurity (11/25/2020)   Hunger Vital Sign    Worried About Running Out of Food in the Last Year: Never true    Ran Out of Food in the Last Year: Never true  Transportation Needs: No Transportation Needs (11/25/2020)   PRAPARE - TrHydrologistMedical): No    Lack of Transportation (Non-Medical): No  Physical Activity: Insufficiently Active (11/25/2020)   Exercise Vital Sign    Days of Exercise per Week: 2 days    Minutes of Exercise per Session: 20 min  Stress: Stress Concern Present (11/25/2020)   FiVincent  Feeling of Stress :  To some extent  Social Connections: Socially Isolated (11/25/2020)   Social Connection and Isolation Panel [NHANES]    Frequency of Communication with Friends and Family: More than three times a week    Frequency of Social Gatherings with Friends and Family: More than three times a week    Attends Religious Services: Never    Marine scientist or Organizations: No    Attends Archivist Meetings: Never    Marital Status: Widowed  Intimate Partner Violence: Not At Risk (11/25/2020)   Humiliation, Afraid, Rape, and Kick questionnaire    Fear of Current or Ex-Partner: No    Emotionally Abused: No    Physically Abused: No    Sexually Abused: No     Current Outpatient Medications:    allopurinol  (ZYLOPRIM) 100 MG tablet, Take 1 tablet (100 mg total) by mouth daily., Disp: 90 tablet, Rfl: 3   aspirin EC 81 MG tablet, Take 1 tablet (81 mg total) by mouth daily. Swallow whole., Disp: , Rfl:    atorvastatin (LIPITOR) 40 MG tablet, TAKE 1 TABLET BY MOUTH EVERY DAY (Patient taking differently: Take 40 mg by mouth daily.), Disp: 90 tablet, Rfl: 2   Blood Pressure Monitoring (SPHYGMOMANOMETER) MISC, 1 each by Does not apply route daily., Disp: 1 each, Rfl: 0   carvedilol (COREG) 3.125 MG tablet, TAKE 1 TABLET (3.125 MG TOTAL) BY MOUTH 2 (TWO) TIMES DAILY WITH A MEAL., Disp: 180 tablet, Rfl: 2   clopidogrel (PLAVIX) 75 MG tablet, Take 1 tablet (75 mg total) by mouth daily., Disp: 30 tablet, Rfl: 2   colchicine 0.6 MG tablet, Take 2 tabs p.o. x1 then take 1 tab p.o. 1 hour later for gout attack., Disp: 20 tablet, Rfl: 0   dapagliflozin propanediol (FARXIGA) 10 MG TABS tablet, Take 1 tablet (10 mg total) by mouth daily., Disp: 90 tablet, Rfl: 3   furosemide (LASIX) 20 MG tablet, Take 1 tablet (20 mg total) by mouth daily as needed (As needed for weight gain of 2 pounds overnight)., Disp: 90 tablet, Rfl: 3   hydrALAZINE (APRESOLINE) 50 MG tablet, TAKE 1 TABLET BY MOUTH THREE TIMES A DAY (Patient taking differently: Take 50 mg by mouth 3 (three) times daily.), Disp: 270 tablet, Rfl: 2   isosorbide mononitrate (IMDUR) 30 MG 24 hr tablet, Take 1 tablet (30 mg total) by mouth daily., Disp: 90 tablet, Rfl: 3   sacubitril-valsartan (ENTRESTO) 24-26 MG, Take 1 tablet by mouth 2 (two) times daily., Disp: 60 tablet, Rfl: 3   No Known Allergies  ROS Review of Systems  Constitutional: Negative.  Negative for chills and fever.  HENT: Negative.  Negative for congestion and facial swelling.        Dental caries  Eyes: Negative.   Respiratory: Negative.  Negative for chest tightness.   Cardiovascular: Negative.  Negative for chest pain.  Gastrointestinal: Negative.  Negative for abdominal distention.   Endocrine: Negative.   Genitourinary: Negative.   Musculoskeletal: Negative.   Skin: Negative.   Allergic/Immunologic: Negative.   Neurological:  Positive for weakness. Negative for dizziness, tremors, seizures, facial asymmetry, speech difficulty and numbness.  Hematological: Negative.   Psychiatric/Behavioral: Negative.    All other systems reviewed and are negative.     Objective:    Physical Exam HENT:     Head: Normocephalic.     Nose: Nose normal.  Neurological:     Mental Status: He is alert and oriented to person, place, and time.  Psychiatric:  Behavior: Behavior normal.     Comments: Cognition is impaired     BP 136/73   Pulse 68   Ht 5' 9"  (1.753 m)   Wt 209 lb 3.2 oz (94.9 kg)   BMI 30.89 kg/m  Wt Readings from Last 3 Encounters:  12/05/21 209 lb 3.2 oz (94.9 kg)  11/24/21 190 lb (86.2 kg)  11/15/21 210 lb (95.3 kg)     Health Maintenance Due  Topic Date Due   Hepatitis C Screening  Never done    There are no preventive care reminders to display for this patient.  No results found for: "TSH" Lab Results  Component Value Date   WBC 7.5 11/25/2021   HGB 11.7 (L) 11/25/2021   HCT 38.1 (L) 11/25/2021   MCV 81.9 11/25/2021   PLT 342 11/25/2021   Lab Results  Component Value Date   NA 141 11/25/2021   K 3.7 11/25/2021   CO2 24 11/25/2021   GLUCOSE 102 (H) 11/25/2021   BUN 20 11/25/2021   CREATININE 1.25 (H) 11/25/2021   BILITOT 0.5 11/24/2021   ALKPHOS 46 11/24/2021   AST 13 (L) 11/24/2021   ALT 12 11/24/2021   PROT 7.8 11/24/2021   ALBUMIN 3.9 11/24/2021   CALCIUM 8.9 11/25/2021   ANIONGAP 8 11/25/2021   EGFR 55 (L) 12/31/2020   Lab Results  Component Value Date   CHOL 112 11/25/2021   Lab Results  Component Value Date   HDL 26 (L) 11/25/2021   Lab Results  Component Value Date   LDLCALC 64 11/25/2021   Lab Results  Component Value Date   TRIG 108 11/25/2021   Lab Results  Component Value Date   CHOLHDL 4.3  11/25/2021   Lab Results  Component Value Date   HGBA1C 6.4 (H) 11/24/2021      Assessment & Plan:   Problem List Items Addressed This Visit       Cardiovascular and Mediastinum   Coronary artery disease, non-occlusive    Stable no chest pain chest is clear, heart is regular      Essential hypertension    Stable at the present time        Genitourinary   Stage 3a chronic kidney disease (HCC)    Stable at the present time        Other   Class 1 obesity without serious comorbidity with body mass index (BMI) of 31.0 to 31.9 in adult    Patient was advised to lose weight      Stroke-like symptoms - Primary    Patient has a history of infarction of the frontal lobe of the brain documented by MRI, he is on aspirin 1 tablet a day daily, patient will be [facility] to [facility] for further evaluation.  Patient MRI and CT scan was reviewed with the patient      Relevant Orders   Ambulatory referral to Neurology    No orders of the defined types were placed in this encounter.   Follow-up: No follow-ups on file.    Cletis Athens, MD

## 2021-12-05 NOTE — Assessment & Plan Note (Signed)
Patient has a history of infarction of the frontal lobe of the brain documented by MRI, he is on aspirin 1 tablet a day daily, patient will be referred to [facility] for further evaluation.  Patient MRI and CT scan was reviewed with the patient

## 2021-12-05 NOTE — Assessment & Plan Note (Signed)
Stable no chest pain chest is clear, heart is regular

## 2021-12-06 ENCOUNTER — Other Ambulatory Visit (HOSPITAL_COMMUNITY): Payer: Self-pay | Admitting: Interventional Radiology

## 2021-12-06 DIAGNOSIS — I6521 Occlusion and stenosis of right carotid artery: Secondary | ICD-10-CM

## 2021-12-15 ENCOUNTER — Telehealth: Payer: Self-pay | Admitting: Family

## 2021-12-15 ENCOUNTER — Ambulatory Visit: Payer: Medicare HMO | Admitting: Family

## 2021-12-15 NOTE — Telephone Encounter (Signed)
Patient did not show for his Heart Failure Clinic appointment on 02/24/21. Will attempt to reschedule.   °

## 2021-12-19 NOTE — Progress Notes (Deleted)
Cardiology Office Note  Date:  12/19/2021   ID:  Benjamin Valentine, Benjamin Valentine 1946/02/07, MRN [id number]  PCP:  Cletis Athens, MD   No chief complaint on file.   HPI:  76 year old-male with past medical history of  CAD s/p bypass surgery in 2022,  HTN, HLD, OA, gout, and HFrEF  Who presents for follow-up of his coronary disease, history of CABG  Last seen by myself in clinic January 2023  Followed by CHF clinic   hospitalized 11/11/2020 for elevated tropinin.   SOB along with ABD distention, and orthopena.   Echo showed decreased LV function with EF 25-30%, mid-mod LV dilation, mild MR.    R/L HC which showed significant 3-vessel disease with patent LIMA to LAD with occluded SVG to RCA and SVG to OM. RHC showed high normal filling pressure, moderate pulmonary hypertension, and mod to severe reduced CO.   diuresed with lasix  discharged on 11/17/2020.   admitted back to the hospital on 11/25/20 after presenting with new onset left arm and left knee numbness and weakness.  Stroke workup was performed and was negative.  He was seen by neurology and was thought symptoms were due to TIA.  He was discharged with 21-day prescription for Plavix.    When last seen in clinic December 31, 2020, had oozing/bleeding from his R femoral cath site.   Medication changes on last visit started on Imdur 30 daily, Lasix 20 daily as needed  Weight at home 195 to 197 pounds If higher weight, takes lasix Overall reports he feels well, family reports he behaves  Labs reviewed A1c 6.0 Total cholesterol 110  PMH:   has a past medical history of Arthritis, Chronic HFrEF (heart failure with reduced ejection fraction) ([place]), CKD (chronic kidney disease), stage III ([place]), Coronary artery disease, Gout, Hyperlipidemia LDL goal <70, Hypertension, Ischemic cardiomyopathy, and Osteoarthritis.  PSH:    Past Surgical History:  Procedure Laterality Date   CARDIAC SURGERY     CABG 2002   LAPAROSCOPIC APPENDECTOMY  N/A 08/11/2018   Procedure: APPENDECTOMY LAPAROSCOPIC;  Surgeon: Olean Ree, MD;  Location: [facility];  Service: General;  Laterality: N/A;   RIGHT/LEFT HEART CATH AND CORONARY/GRAFT ANGIOGRAPHY N/A 11/15/2020   Procedure: RIGHT/LEFT HEART CATH AND CORONARY/GRAFT ANGIOGRAPHY;  Surgeon: Wellington Hampshire, MD;  Location: [place] [facility];  Service: Cardiovascular;  Laterality: N/A;    Current Outpatient Medications  Medication Sig Dispense Refill   allopurinol (ZYLOPRIM) 100 MG tablet Take 1 tablet (100 mg total) by mouth daily. 90 tablet 3   aspirin EC 81 MG tablet Take 1 tablet (81 mg total) by mouth daily. Swallow whole.     atorvastatin (LIPITOR) 40 MG tablet TAKE 1 TABLET BY MOUTH EVERY DAY (Patient taking differently: Take 40 mg by mouth daily.) 90 tablet 2   Blood Pressure Monitoring (SPHYGMOMANOMETER) MISC 1 each by Does not apply route daily. 1 each 0   carvedilol (COREG) 3.125 MG tablet TAKE 1 TABLET (3.125 MG TOTAL) BY MOUTH 2 (TWO) TIMES DAILY WITH A MEAL. 180 tablet 2   clopidogrel (PLAVIX) 75 MG tablet Take 1 tablet (75 mg total) by mouth daily. 30 tablet 2   colchicine 0.6 MG tablet Take 2 tabs p.o. x1 then take 1 tab p.o. 1 hour later for gout attack. 20 tablet 0   dapagliflozin propanediol (FARXIGA) 10 MG TABS tablet Take 1 tablet (10 mg total) by mouth daily. 90 tablet 3   furosemide (LASIX) 20 MG tablet Take 1 tablet (20  mg total) by mouth daily as needed (As needed for weight gain of 2 pounds overnight). 90 tablet 3   hydrALAZINE (APRESOLINE) 50 MG tablet TAKE 1 TABLET BY MOUTH THREE TIMES A DAY (Patient taking differently: Take 50 mg by mouth 3 (three) times daily.) 270 tablet 2   isosorbide mononitrate (IMDUR) 30 MG 24 hr tablet Take 1 tablet (30 mg total) by mouth daily. 90 tablet 3   sacubitril-valsartan (ENTRESTO) 24-26 MG Take 1 tablet by mouth 2 (two) times daily. 60 tablet 3   No current facility-administered medications for this visit.     Allergies:   Patient  has no known allergies.   Social History:  The patient  reports that he has quit smoking. He has never used smokeless tobacco. He reports that he does not drink alcohol and does not use drugs.   Family History:   family history is not on file.    Review of Systems: Review of Systems  Constitutional: Negative.   HENT: Negative.    Respiratory: Negative.    Cardiovascular: Negative.   Gastrointestinal: Negative.   Musculoskeletal: Negative.   Neurological: Negative.   Psychiatric/Behavioral: Negative.    All other systems reviewed and are negative.    PHYSICAL EXAM: VS:  There were no vitals taken for this visit. , BMI There is no height or weight on file to calculate BMI. GEN: Well nourished, well developed, in no acute distress HEENT: normal Neck: no JVD, carotid bruits, or masses Cardiac: RRR; no murmurs, rubs, or gallops,no edema  Respiratory:  clear to auscultation bilaterally, normal work of breathing GI: soft, nontender, nondistended, + BS MS: no deformity or atrophy Skin: warm and dry, no rash Neuro:  Strength and sensation are intact Psych: euthymic mood, full affect   Recent Labs: 11/24/2021: ALT 12 11/25/2021: BUN 20; Creatinine, Ser 1.25; Hemoglobin 11.7; Magnesium 2.1; Platelets 342; Potassium 3.7; Sodium 141    Lipid Panel Lab Results  Component Value Date   CHOL 112 11/25/2021   HDL 26 (L) 11/25/2021   LDLCALC 64 11/25/2021   TRIG 108 11/25/2021      Wt Readings from Last 3 Encounters:  12/05/21 209 lb 3.2 oz (94.9 kg)  11/24/21 190 lb (86.2 kg)  11/15/21 210 lb (95.3 kg)      ASSESSMENT AND PLAN:  Problem List Items Addressed This Visit   None Ischemic cardiomyopathy Appears euvolemic, recommended that he continue carvedilol Farxiga hydralazine isosorbide Takes Lasix as needed based off weight gain Family adamant weight has been stable at home 195 up to 197 Follows relatively strict diet  Coronary disease with history of CABG stable  angina Denies anginal symptoms On beta-blocker statin nitrate Recent cardiac catheterization results discussed, medical management recommended  Hyperlipidemia Cholesterol is at goal on the current lipid regimen. No changes to the medications were made.  Essential hypertension Blood pressure is well controlled on today's visit. No changes made to the medications.    Total encounter time more than 25 minutes  Greater than 50% was spent in counseling and coordination of care with the patient    Signed, Dossie Arbour, M.D., Ph.D. [facility] [place], [place] [phone]

## 2021-12-20 ENCOUNTER — Ambulatory Visit: Payer: Medicare HMO | Admitting: Cardiovascular Disease

## 2021-12-20 ENCOUNTER — Ambulatory Visit (HOSPITAL_COMMUNITY)
Admission: RE | Admit: 2021-12-20 | Discharge: 2021-12-20 | Disposition: A | Discharge status: Home / Self Care | Payer: Medicare HMO | Source: Ambulatory Visit | Attending: Interventional Radiology | Admitting: Interventional Radiology

## 2021-12-20 DIAGNOSIS — I6521 Occlusion and stenosis of right carotid artery: Secondary | ICD-10-CM | POA: Diagnosis not present

## 2021-12-20 DIAGNOSIS — N1831 Chronic kidney disease, stage 3a: Secondary | ICD-10-CM

## 2021-12-20 DIAGNOSIS — I5022 Chronic systolic (congestive) heart failure: Secondary | ICD-10-CM

## 2021-12-20 DIAGNOSIS — I2510 Atherosclerotic heart disease of native coronary artery without angina pectoris: Secondary | ICD-10-CM

## 2021-12-20 DIAGNOSIS — I10 Essential (primary) hypertension: Secondary | ICD-10-CM

## 2021-12-20 DIAGNOSIS — E785 Hyperlipidemia, unspecified: Secondary | ICD-10-CM

## 2021-12-21 ENCOUNTER — Encounter: Payer: Self-pay | Admitting: Cardiovascular Disease

## 2021-12-21 HISTORY — PX: IR RADIOLOGIST EVAL & MGMT: IMG5224

## 2021-12-27 ENCOUNTER — Other Ambulatory Visit (HOSPITAL_COMMUNITY): Payer: Self-pay | Admitting: Interventional Radiology

## 2021-12-27 DIAGNOSIS — I6521 Occlusion and stenosis of right carotid artery: Secondary | ICD-10-CM

## 2022-01-01 NOTE — H&P (Incomplete)
Chief Complaint: Patient was seen in consultation today for No chief complaint on file.  at the request of Benjamin Valentine,Benjamin Valentine  Referring Physician(s): Benjamin Valentine,Benjamin Valentine  Supervising Physician: {Supervising Physician:[id number]}  Patient Status: {IR Consult Patient Status:21879}  History of Present Illness: Benjamin Valentine is a 76 y.o. male ***  Past Medical History:  Diagnosis Date   Arthritis    Chronic HFrEF (heart failure with reduced ejection fraction) (HCC)    a. 10/2020 Echo: EF 25-30%, nl RV fxn, mild MR.   CKD (chronic kidney disease), stage III (HCC)    Coronary artery disease    a. 2002 s/p CABG x 2: LIMA->LAD, VG->RPDA; b. 11/2020 Cath: LM 20ost, LAD 17257m, LCX 99p/m, 6675m/d, OM1 30, RCA 100p - fills via collats from LAD. RPL1 fills via collats from LCX. VG->PDA 100, LIMA->LAD ok-->med rx.   Gout    Hyperlipidemia LDL goal <70    Hypertension    Ischemic cardiomyopathy    a. 10/2020 Echo: EF 25-30%.   Osteoarthritis     Past Surgical History:  Procedure Laterality Date   CARDIAC SURGERY     CABG 2002   IR RADIOLOGIST EVAL & MGMT  12/21/2021   LAPAROSCOPIC APPENDECTOMY N/A 08/11/2018   Procedure: APPENDECTOMY LAPAROSCOPIC;  Surgeon: [name], [name], MD;  Location: [facility];  Service: General;  Laterality: N/A;   RIGHT/LEFT HEART CATH AND CORONARY/GRAFT ANGIOGRAPHY N/A 11/15/2020   Procedure: RIGHT/LEFT HEART CATH AND CORONARY/GRAFT ANGIOGRAPHY;  Surgeon: Iran OuchArida, [name], MD;  Location: [facility];  Service: Cardiovascular;  Laterality: N/A;    Allergies: Patient has no known allergies.  Medications: Prior to Admission medications   Medication Sig Start Date End Date Taking? Authorizing Provider  allopurinol (ZYLOPRIM) 100 MG tablet Take 1 tablet (100 mg total) by mouth daily. 06/29/21  Yes Corky DownsMasoud, [name], MD  aspirin EC 81 MG tablet Take 1 tablet (81 mg total) by mouth daily. Swallow whole. 11/25/21 02/23/22 Yes [name], [name], MD  atorvastatin (LIPITOR) 40 MG  tablet TAKE 1 TABLET BY MOUTH EVERY DAY Patient taking differently: Take 40 mg by mouth daily. 05/23/21  Yes [name], [name], MD  carvedilol (COREG) 3.125 MG tablet TAKE 1 TABLET (3.125 MG TOTAL) BY MOUTH 2 (TWO) TIMES DAILY WITH A MEAL. 05/30/21  Yes [name], [name], MD  clopidogrel (PLAVIX) 75 MG tablet Take 1 tablet (75 mg total) by mouth daily. 11/25/21 02/23/22 Yes [name], [name], MD  colchicine 0.6 MG tablet Take 2 tabs p.o. x1 then take 1 tab p.o. 1 hour later for gout attack. 10/23/21  Yes [name], [name], [name]  dapagliflozin propanediol (FARXIGA) 10 MG TABS tablet Take 1 tablet (10 mg total) by mouth daily. 11/02/21  Yes [name], [name], [name]  furosemide (LASIX) 20 MG tablet Take 1 tablet (20 mg total) by mouth daily as needed (As needed for weight gain of 2 pounds overnight). 06/29/21  Yes [name], [name], MD  hydrALAZINE (APRESOLINE) 50 MG tablet TAKE 1 TABLET BY MOUTH THREE TIMES A DAY 05/30/21  Yes [name], [name], MD  isosorbide mononitrate (IMDUR) 30 MG 24 hr tablet Take 1 tablet (30 mg total) by mouth daily. 06/29/21  Yes [name], [name], MD  Blood Pressure Monitoring (SPHYGMOMANOMETER) MISC 1 each by Does not apply route daily. 05/01/21   [name], [name], NP  sacubitril-valsartan (ENTRESTO) 24-26 MG Take 1 tablet by mouth 2 (two) times daily. Patient not taking: Reported on 12/29/2021 11/15/21   [name], [name], [name]     No family history on file.  Social  History   Socioeconomic History   Marital status: Widowed    Spouse name: Not on file   Number of children: Not on file   Years of education: Not on file   Highest education level: Not on file  Occupational History   Not on file  Tobacco Use   Smoking status: Former   Smokeless tobacco: Never  Vaping Use   Vaping Use: Never used  Substance and Sexual Activity   Alcohol use: No   Drug use: No   Sexual activity: Not on file  Other Topics Concern   Not on file  Social History Narrative   Not on file   Social Determinants of Health    Financial Resource Strain: Low Risk  (11/25/2020)   Overall Financial Resource Strain (CARDIA)    Difficulty of Paying Living Expenses: Not very hard  Food Insecurity: No Food Insecurity (11/25/2020)   Hunger Vital Sign    Worried About Running Out of Food in the Last Year: Never true    Ran Out of Food in the Last Year: Never true  Transportation Needs: No Transportation Needs (11/25/2020)   PRAPARE - Administrator, Civil Service (Medical): No    Lack of Transportation (Non-Medical): No  Physical Activity: Insufficiently Active (11/25/2020)   Exercise Vital Sign    Days of Exercise per Week: 2 days    Minutes of Exercise per Session: 20 min  Stress: Stress Concern Present (11/25/2020)   [employer] of Occupational Health - Occupational Stress Questionnaire    Feeling of Stress : To some extent  Social Connections: Socially Isolated (11/25/2020)   Social Connection and Isolation Panel [NHANES]    Frequency of Communication with Friends and Family: More than three times a week    Frequency of Social Gatherings with Friends and Family: More than three times a week    Attends Religious Services: Never    Database administrator or Organizations: No    Attends Banker Meetings: Never    Marital Status: Widowed    ECOG Status: {CHL ONC ECOG [id number]  Review of Systems: A 12 point ROS discussed and pertinent positives are indicated in the HPI above.  All other systems are negative.  Review of Systems  Vital Signs: There were no vitals taken for this visit.  Advance Care Plan: {Advance Care EXHB:71696}    Physical Exam  Imaging: IR Radiologist Eval & Mgmt  Result Date: 12/21/2021 EXAM: NEW PATIENT OFFICE VISIT CHIEF COMPLAINT: History of TIA.  Abnormal CT angiogram of the head and neck. Current Pain Level: 1-10 HISTORY OF PRESENT ILLNESS: 76 year old right handed male who presents for evaluation accompanied by his daughter, Benjamin Valentine.  11/25/2018 patient presented with symptoms of left facial droop, left grip weakness and dysarthria. Stroke workup at that time involved an of the MRI brain, a CT angiogram of the head and neck. CT angiogram of the head and neck revealed multifocal extra cranial and intracranial stenosis, more specifically of the right internal carotid artery intracranially. MRI of the brain revealed the focal areas of diffusion weighted restriction involving the right frontal subcortical areas. Patient was started on aspirin 81 mg a day, and Plavix 75 mg a day. Patient reports no similar like symptoms. On specific questioning, the patient denies any unusual headaches, nausea, vomiting, visual symptoms of amaurosis fugax, or double vision or blurred vision. Denies any difficulty with speech, swallowing solids or liquids, limb paresthesias or leg weakness. Denies any symptoms or episodes of  loss of awareness, or seizure-like activity. Additionally, he denies any shortness of breath or dyspnea, ankle edema or paroxysmal nocturnal dyspnea. He denies any coughing, wheezing or sputum production. He also denies any difficulty with abdominal pain, constipation, diarrhea or melena. Denies any dysuria, hematuria, or polyuria. Denies any recent chills, fever or rigors. Diagnosis * : Date . * : Arthritis * : . * : Chronic HFrEF (heart failure with reduced ejection fraction) (HCC) * : * : a. 10/2020 Echo: EF 25-30%, nl RV fxn, mild MR. . * : CKD (chronic kidney disease), stage III (HCC) * : . * : Coronary artery disease * : * : a. 2002 s/p CABG x 2: LIMA->LAD, VG->RPDA; b. 11/2020 Cath: LM 20ost, LAD 134m, LCX 99p/m, 24m/d, OM1 30, RCA 100p - fills via collats from LAD. RPL1 fills via collats from LCX. VG->PDA 100, LIMA->LAD ok-->med rx. . * : Gout * : . * : Hyperlipidemia LDL goal <70 * : . * : Hypertension * : . * : Ischemic cardiomyopathy * : * : a. 10/2020 Echo: EF 25-30%. . * : Osteoarthritis * : Past Surgical History: Procedure * :  Laterality * : Date . * : CARDIAC SURGERY * : * : * : CABG 2002 . * : LAPAROSCOPIC APPENDECTOMY * : N/A * : 08/11/2018 * : Procedure: APPENDECTOMY LAPAROSCOPIC; Surgeon: Henrene Dodge, MD; Location: [facility] OR[facility]; Service: General; Laterality: N/A; . * : RIGHT/LEFT HEART CATH AND CORONARY/GRAFT ANGIOGRAPHY * : N/A * : 11/15/2020 * : Procedure: RIGHT/LEFT HEART CATH AND CORONARY/GRAFT ANGIOGRAPHY; Surgeon: Iran Ouch, MD; Location: [facility]; Service: Cardiovascular; Laterality: N/A; History reviewed. No pertinent family history. Socioeconomic History . * : Marital status: * : Widowed * : * : Spouse name: * : Not on file . * : Number of children: * : Not on file . * : Years of education: * : Not on file . * : Highest education level: * : Not on file Occupational History . * : Not on file Tobacco Use . * : Smoking status: * : Former . * : Smokeless tobacco: * : Never Vaping Use . * : Vaping Use: * : Never used Substance and Sexual Activity . * : Alcohol use: * : No . * : Drug use: * : No . * : Sexual activity: * : Not on file Other Topics * : Concern . * : Not on file Social History Narrative . * : Not on file Financial Resource Strain: Low Risk  (11/25/2020) * : Overall Financial Resource Strain (CARDIA) * : . * : Difficulty of Paying Living Expenses: Not very hard Food Insecurity: No Food Insecurity (11/25/2020) * : Hunger Vital Sign * : . * : Worried About Running Out of Food in the Last Year: Never true * : . * : Ran Out of Food in the Last Year: Never true Transportation Needs: No Transportation Needs (11/25/2020) * : PRAPARE - Transportation * : . * : Lack of Transportation (Medical): No * : . * : Lack of Transportation (Non-Medical): No Physical Activity: Insufficiently Active (11/25/2020) * : Exercise Vital Sign * : . * : Days of Exercise per Week: 2 days * : . * : Minutes of Exercise per Session: 20 min Stress: Stress Concern Present (11/25/2020) * : [employer] -  Occupational Stress Questionnaire * : . * : Feeling of Stress : To some extent Social Connections: Socially Isolated (11/25/2020) * : Social  Connection and Isolation Panel NHANES * : . * : Frequency of Communication with Friends and Family: More than three times a week * : . * : Frequency of Social Gatherings with Friends and Family: More than three times a week * : . * : Attends Religious Services: Never * : . * : Active Member of Clubs or Organizations: No * : . * : Attends Club or Organization Meetings: Never * : . * : Marital Status: Widowed No Known Alleriges. Medications: aspirin EC 81 MG tablet take 1 tablet (81 mg total) by mouth daily. Swallow whole. atorvastatin 40 MG tablet commonly known as: LIPITOR TAKE 1 TABLET BY MOUTH EVERY DAY carvedilol 3.125 MG tablet commonly known as: COREG TAKE 1 TABLET (3.125 MG TOTAL) BY MOUTH 2 (TWO) TIMES DAILY WITH A MEAL. clopidogrel 75 MG tablet commonly known as: PLAVIX take 1 tablet (75 mg total) by mouth daily. colchicine 0.6 MG tablet take 2 tabs p.o. x1 then take 1 tab p.o. 1 hour later for gout attack. dapagliflozin propanediol 10 MG Tabs tablet commonly known as: Marcelline Deist take 1 tablet (10 mg total) by mouth daily. furosemide 20 MG tablet commonly known as: LASIX take 1 tablet (20 mg total) by mouth daily as needed (As needed for weight gain of 2 pounds overnight). hydrALAZINE 50 MG tablet commonly known as: APRESOLINE TAKE 1 TABLET BY MOUTH THREE TIMES A DAY isosorbide mononitrate 30 MG 24 hr tablet commonly known as: IMDUR take 1 tablet (30 mg total) by mouth daily. sacubitril-valsartan 24-26 MG commonly known as: ENTRESTO take 1 tablet by mouth 2 (two) times daily. Sphygmomanometer Misc 1 each by Does not apply route daily. REVIEW OF SYSTEMS: Negative unless as mentioned above. PHYSICAL EXAMINATION: Patient is awake, alert, oriented to time, place, space, appropriately responsive speech and comprehension intact. Normal eye contact. No gross lateralizing abnormal  neurological features noted. Station and gait reveals no staggering, or short steps or loss of balance. ASSESSMENT AND PLAN: Findings on the CT angiogram of the head and neck were reviewed with the patient and the patient's daughter. They were informed of the multifocal areas of stenosis extra cranially and intracranially but most notably the right internal carotid artery intracranially. Given the patient's clinical findings, and neuroimaging studies, it was felt the patient needs a formal catheter arteriogram in order to evaluate more accurately the respective areas of stenosis. On the findings of the diagnostic catheter arteriogram further management considerations will be discussed including endovascular revascularization to prevent future ischemic events. The procedure was reviewed in detail. The diagnostic catheter arteriogram would be under conscious sedation via either trans radial or transfemoral route. The results of the study would be discussed with him and his daughter right after the procedure. Patient advised to maintain adequate hydration. Patient is agreeable to proceed with the diagnostic catheter arteriogram. This will be scheduled at the earliest. Patient and daughter were asked to call for any concerns or questions. Electronically Signed   By: Julieanne Cotton M.D.   On: 12/21/2021 08:00    Labs:  CBC: Recent Labs    06/01/21 1001 11/24/21 1254 11/25/21 0745  WBC 8.3 9.1 7.5  HGB 12.7* 12.1* 11.7*  HCT 41.4 39.5 38.1*  PLT 363 373 342    COAGS: Recent Labs    11/24/21 1254  INR 1.0  APTT 44*    BMP: Recent Labs    08/13/21 0952 11/15/21 1324 11/24/21 1254 11/25/21 0745  NA 137 140 139 141  K 4.2 4.2  4.4 3.7  CL 107 112* 108 109  CO2 19* 24 26 24   GLUCOSE 100* 93 117* 102*  BUN 22 21 22 20   CALCIUM 9.2 9.3 9.0 8.9  CREATININE 1.37* 1.73* 1.27* 1.25*  GFRNONAA 53* 40* 58* 59*    LIVER FUNCTION TESTS: Recent Labs    06/01/21 1001 11/24/21 1254  BILITOT  0.6 0.5  AST 18 13*  ALT 11 12  ALKPHOS 56 46  PROT 7.2 7.8  ALBUMIN 3.5 3.9     Assessment and Plan:  76 y.o. male outpatient. Former Smoker. History of CAD s.p CABG, HLD, HTN, TIA. Found to have right intracranial carotid artery stenosis on MRI during workup for left sided weakness and left sided facial droop on 10.12.2023. Patient was seen along with his daughter in the [facility] on 11.8.23 with Dr. [facility]. It as at that time the Patient decided to proceed with a diagnostic arteriogram for further evaluation. The patient presents for cerebral angiogram.  CTA Head from 10.12.23 reads Severe stenosis of the distal petrous right ICA and moderate stenosis of the right paraclinoid ICA. *** All labs and meications are within acceptable parameters. Patient is on ASA and Plavix. Patienthas been NPO since midnight.   Thank you for this interesting consult.  I greatly enjoyed meeting JIHAD BROWNLOW and look forward to participating in their care.  A copy of this report was sent to the requesting provider on this date.  Electronically Signed: 12.12.23, NP 01/01/2022, 9:16 PM   I spent a total of {New [place] Mires {New Out-Pt:[id number]}  {Established Out-Pt:[id number]} in face to face in clinical consultation, greater than 50% of which was counseling/coordinating care for ***

## 2022-01-02 ENCOUNTER — Other Ambulatory Visit: Payer: Self-pay | Admitting: Radiology

## 2022-01-02 DIAGNOSIS — G459 Transient cerebral ischemic attack, unspecified: Secondary | ICD-10-CM

## 2022-01-03 ENCOUNTER — Other Ambulatory Visit: Payer: Self-pay

## 2022-01-03 ENCOUNTER — Other Ambulatory Visit (HOSPITAL_COMMUNITY): Payer: Self-pay | Admitting: Interventional Radiology

## 2022-01-03 ENCOUNTER — Encounter (HOSPITAL_COMMUNITY): Payer: Self-pay

## 2022-01-03 ENCOUNTER — Ambulatory Visit (HOSPITAL_COMMUNITY)
Admission: RE | Admit: 2022-01-03 | Discharge: 2022-01-03 | Disposition: A | Discharge status: Home / Self Care | Payer: Medicare HMO | Source: Ambulatory Visit | Attending: Interventional Radiology | Admitting: Interventional Radiology

## 2022-01-03 DIAGNOSIS — I6521 Occlusion and stenosis of right carotid artery: Secondary | ICD-10-CM | POA: Diagnosis not present

## 2022-01-03 DIAGNOSIS — I658 Occlusion and stenosis of other precerebral arteries: Secondary | ICD-10-CM | POA: Diagnosis not present

## 2022-01-03 DIAGNOSIS — I6602 Occlusion and stenosis of left middle cerebral artery: Secondary | ICD-10-CM | POA: Diagnosis not present

## 2022-01-03 DIAGNOSIS — G459 Transient cerebral ischemic attack, unspecified: Secondary | ICD-10-CM

## 2022-01-03 DIAGNOSIS — I651 Occlusion and stenosis of basilar artery: Secondary | ICD-10-CM | POA: Insufficient documentation

## 2022-01-03 HISTORY — PX: IR US GUIDE VASC ACCESS RIGHT: IMG2390

## 2022-01-03 HISTORY — PX: IR ANGIO VERTEBRAL SEL VERTEBRAL UNI L MOD SED: IMG5367

## 2022-01-03 HISTORY — PX: IR ANGIO EXTRACRAN SEL COM CAROTID INNOMINATE UNI BILAT MOD SED: IMG5357

## 2022-01-03 LAB — CBC
HCT: 41.5 % (ref 39.0–52.0)
Hemoglobin: 12.4 g/dL — ABNORMAL LOW (ref 13.0–17.0)
MCH: 24.8 pg — ABNORMAL LOW (ref 26.0–34.0)
MCHC: 29.9 g/dL — ABNORMAL LOW (ref 30.0–36.0)
MCV: 83 fL (ref 80.0–100.0)
Platelets: 379 10*3/uL (ref 150–400)
RBC: 5 MIL/uL (ref 4.22–5.81)
RDW: 16.3 % — ABNORMAL HIGH (ref 11.5–15.5)
WBC: 6.7 10*3/uL (ref 4.0–10.5)
nRBC: 0 % (ref 0.0–0.2)

## 2022-01-03 LAB — BASIC METABOLIC PANEL
Anion gap: 9 (ref 5–15)
BUN: 19 mg/dL (ref 8–23)
CO2: 23 mmol/L (ref 22–32)
Calcium: 9.4 mg/dL (ref 8.9–10.3)
Chloride: 107 mmol/L (ref 98–111)
Creatinine, Ser: 1.4 mg/dL — ABNORMAL HIGH (ref 0.61–1.24)
GFR, Estimated: 52 mL/min — ABNORMAL LOW (ref >60)
Glucose, Bld: 110 mg/dL — ABNORMAL HIGH (ref 70–99)
Potassium: 4.3 mmol/L (ref 3.5–5.1)
Sodium: 139 mmol/L (ref 135–145)

## 2022-01-03 LAB — PROTIME-INR
INR: 1 (ref 0.8–1.2)
Prothrombin Time: 13 seconds (ref 11.4–15.2)

## 2022-01-03 MED ORDER — IOHEXOL 300 MG/ML  SOLN
50.0000 mL | Freq: Once | INTRAMUSCULAR | Status: AC | PRN
  Administered 2022-01-03: 10 mL via INTRA_ARTERIAL

## 2022-01-03 MED ORDER — VERAPAMIL HCL 2.5 MG/ML IV SOLN
INTRAVENOUS | Status: AC
  Filled 2022-01-03: qty 2

## 2022-01-03 MED ORDER — LIDOCAINE HCL 1 % IJ SOLN: 20.0000 mL | Freq: Once | INTRAMUSCULAR | Status: AC

## 2022-01-03 MED ORDER — IOHEXOL 300 MG/ML  SOLN
100.0000 mL | Freq: Once | INTRAMUSCULAR | Status: AC | PRN
  Administered 2022-01-03: 50 mL via INTRA_ARTERIAL

## 2022-01-03 MED ORDER — MIDAZOLAM HCL 2 MG/2ML IJ SOLN
INTRAMUSCULAR | Status: AC
  Filled 2022-01-03: qty 2

## 2022-01-03 MED ORDER — MIDAZOLAM HCL 2 MG/2ML IJ SOLN
INTRAMUSCULAR | Status: AC | PRN
  Administered 2022-01-03: 1 mg via INTRAVENOUS

## 2022-01-03 MED ORDER — LIDOCAINE HCL 1 % IJ SOLN
INTRAMUSCULAR | Status: AC
  Administered 2022-01-03: 5 mL via INTRADERMAL
  Filled 2022-01-03: qty 20

## 2022-01-03 MED ORDER — FENTANYL CITRATE (PF) 100 MCG/2ML IJ SOLN
INTRAMUSCULAR | Status: AC | PRN
  Administered 2022-01-03: 25 ug via INTRAVENOUS

## 2022-01-03 MED ORDER — HEPARIN SODIUM (PORCINE) 1000 UNIT/ML IJ SOLN
INTRAMUSCULAR | Status: AC
  Filled 2022-01-03: qty 10

## 2022-01-03 MED ORDER — FENTANYL CITRATE (PF) 100 MCG/2ML IJ SOLN
INTRAMUSCULAR | Status: AC
  Filled 2022-01-03: qty 2

## 2022-01-03 MED ORDER — VERAPAMIL HCL 2.5 MG/ML IV SOLN: INTRAVENOUS | Status: AC | PRN

## 2022-01-03 MED ORDER — HYDRALAZINE HCL 20 MG/ML IJ SOLN
INTRAMUSCULAR | Status: AC
  Filled 2022-01-03: qty 1

## 2022-01-03 MED ORDER — SODIUM CHLORIDE 0.9 % IV SOLN: INTRAVENOUS | Status: DC

## 2022-01-03 MED ORDER — SODIUM CHLORIDE 0.9 % IV SOLN: INTRAVENOUS | Status: AC

## 2022-01-03 NOTE — Progress Notes (Signed)
Patient and daughter was given discharge instructions. Both verbalized understanding. 

## 2022-01-03 NOTE — Sedation Documentation (Signed)
Right radial sheath removed, TR band applied with 13cc of air at 1052

## 2022-01-03 NOTE — Procedures (Signed)
INR  Status post bilateral common carotid arteriograms and left vertebral artery angiogram. Right radial approach. Findings. 1.  Severe preocclusive stenosis of the right internal carotid artery petrous segment. 2.  Approximately 50 to 60% stenosis left middle cerebral artery M1 segment. 3.  Approximately 25% stenosis of the proximal basilar artery.  Fatima Sanger MD.

## 2022-01-03 NOTE — H&P (Signed)
Chief Complaint: Patient was seen in consultation today for Cerebral arteriogram at the request of  Dr Andree Elk  Referring Physician(s): Julieanne Cotton  Supervising Physician: Julieanne Cotton  Patient Status: [facility] - Out-pt  History of Present Illness: Benjamin Valentine is a 76 y.o. male   Code stroke 11/24/21 Left facial droop and left weakness CTA:  IMPRESSION: 1. Severe stenosis of the non dominant right vertebral artery origin with intermittent non opacification throughout the neck and non opacification intradurally. 2. Severe stenosis of the proximal left V2 vertebral artery with more distal opacification. Also, severe stenosis of the intradural left vertebral artery. 3. Severe stenosis of the distal petrous right ICA and moderate stenosis of the right paraclinoid ICA. 4. Severe stenosis of bilateral P2 PCAs. 5. Hypoplastic right A1 ACA with probable superimposed severe stenosis. 6. Moderate distal ACA stenosis. 7. Approximately 40% stenosis of the proximal right ICA in the neck. 8. Approximately 50% stenosis of the left common carotid artery.  Consulted with Dr Corliss Skains 12/20/21 ASSESSMENT AND PLAN: Findings on the CT angiogram of the head and neck were reviewed with the patient and the patient's daughter. They were informed of the multifocal areas of stenosis extra cranially and intracranially but most notably the right internal carotid artery intracranially.    Given the patient's clinical findings, and neuroimaging studies, it was felt the patient needs a formal catheter arteriogram in order to evaluate more accurately the respective areas of stenosis. On the findings of the diagnostic catheter arteriogram further management considerations will be discussed including endovascular revascularization to prevent future ischemic events.  Scheduled now for cerebral arteriiogram    Past Medical History:  Diagnosis Date   Arthritis    Chronic HFrEF (heart  failure with reduced ejection fraction) (HCC)    a. 10/2020 Echo: EF 25-30%, nl RV fxn, mild MR.   CKD (chronic kidney disease), stage III (HCC)    Coronary artery disease    a. 2002 s/p CABG x 2: LIMA->LAD, VG->RPDA; b. 11/2020 Cath: LM 20ost, LAD 157m, LCX 99p/m, 72m/d, OM1 30, RCA 100p - fills via collats from LAD. RPL1 fills via collats from LCX. VG->PDA 100, LIMA->LAD ok-->med rx.   Gout    Hyperlipidemia LDL goal <70    Hypertension    Ischemic cardiomyopathy    a. 10/2020 Echo: EF 25-30%.   Osteoarthritis     Past Surgical History:  Procedure Laterality Date   CARDIAC SURGERY     CABG 2002   IR RADIOLOGIST EVAL & MGMT  12/21/2021   LAPAROSCOPIC APPENDECTOMY N/A 08/11/2018   Procedure: APPENDECTOMY LAPAROSCOPIC;  Surgeon: Henrene Dodge, MD;  Location: [facility] ORS;  Service: General;  Laterality: N/A;   RIGHT/LEFT HEART CATH AND CORONARY/GRAFT ANGIOGRAPHY N/A 11/15/2020   Procedure: RIGHT/LEFT HEART CATH AND CORONARY/GRAFT ANGIOGRAPHY;  Surgeon: Iran Ouch, MD;  Location: [facility] INVASIVE CV LAB;  Service: Cardiovascular;  Laterality: N/A;    Allergies: Patient has no known allergies.  Medications: Prior to Admission medications   Medication Sig Start Date End Date Taking? Authorizing Provider  allopurinol (ZYLOPRIM) 100 MG tablet Take 1 tablet (100 mg total) by mouth daily. 06/29/21  Yes Corky Downs, MD  aspirin EC 81 MG tablet Take 1 tablet (81 mg total) by mouth daily. Swallow whole. 11/25/21 02/23/22 Yes Darlin Priestly, MD  atorvastatin (LIPITOR) 40 MG tablet TAKE 1 TABLET BY MOUTH EVERY DAY Patient taking differently: Take 40 mg by mouth daily. 05/23/21  Yes [name], Renda Rolls, MD  carvedilol (COREG) 3.125 MG tablet  TAKE 1 TABLET (3.125 MG TOTAL) BY MOUTH 2 (TWO) TIMES DAILY WITH A MEAL. 05/30/21  Yes [name], Renda Rolls, MD  clopidogrel (PLAVIX) 75 MG tablet Take 1 tablet (75 mg total) by mouth daily. 11/25/21 02/23/22 Yes Darlin Priestly, MD  dapagliflozin propanediol (FARXIGA) 10 MG TABS tablet  Take 1 tablet (10 mg total) by mouth daily. 11/02/21  Yes Clarisa Kindred A, [name]  furosemide (LASIX) 20 MG tablet Take 1 tablet (20 mg total) by mouth daily as needed (As needed for weight gain of 2 pounds overnight). 06/29/21  Yes [name], Renda Rolls, MD  hydrALAZINE (APRESOLINE) 50 MG tablet TAKE 1 TABLET BY MOUTH THREE TIMES A DAY 05/30/21  Yes [name], Renda Rolls, MD  isosorbide mononitrate (IMDUR) 30 MG 24 hr tablet Take 1 tablet (30 mg total) by mouth daily. 06/29/21  Yes [name], Renda Rolls, MD  Blood Pressure Monitoring (SPHYGMOMANOMETER) MISC 1 each by Does not apply route daily. 05/01/21   Kara Dies, NP  colchicine 0.6 MG tablet Take 2 tabs p.o. x1 then take 1 tab p.o. 1 hour later for gout attack. 10/23/21   [name], Roselyn Bering, [name]  sacubitril-valsartan (ENTRESTO) 24-26 MG Take 1 tablet by mouth 2 (two) times daily. Patient not taking: Reported on 12/29/2021 11/15/21   Delma Freeze, [name]     History reviewed. No pertinent family history.  Social History   Socioeconomic History   Marital status: Widowed    Spouse name: Not on file   Number of children: Not on file   Years of education: Not on file   Highest education level: Not on file  Occupational History   Not on file  Tobacco Use   Smoking status: Former   Smokeless tobacco: Never  Vaping Use   Vaping Use: Never used  Substance and Sexual Activity   Alcohol use: No   Drug use: No   Sexual activity: Not on file  Other Topics Concern   Not on file  Social History Narrative   Not on file   Social Determinants of Health   Financial Resource Strain: Low Risk  (11/25/2020)   Overall Financial Resource Strain (CARDIA)    Difficulty of Paying Living Expenses: Not very hard  Food Insecurity: No Food Insecurity (11/25/2020)   Hunger Vital Sign    Worried About Running Out of Food in the Last Year: Never true    Ran Out of Food in the Last Year: Never true  Transportation Needs: No Transportation Needs (11/25/2020)   PRAPARE -  Administrator, Civil Service (Medical): No    Lack of Transportation (Non-Medical): No  Physical Activity: Insufficiently Active (11/25/2020)   Exercise Vital Sign    Days of Exercise per Week: 2 days    Minutes of Exercise per Session: 20 min  Stress: Stress Concern Present (11/25/2020)   [employer] of Occupational Health - Occupational Stress Questionnaire    Feeling of Stress : To some extent  Social Connections: Socially Isolated (11/25/2020)   Social Connection and Isolation Panel [NHANES]    Frequency of Communication with Friends and Family: More than three times a week    Frequency of Social Gatherings with Friends and Family: More than three times a week    Attends Religious Services: Never    Database administrator or Organizations: No    Attends Banker Meetings: Never    Marital Status: Widowed   Review of Systems: A 12 point ROS discussed and pertinent positives are indicated in the HPI  above.  All other systems are negative.  Review of Systems  Constitutional:  Positive for activity change. Negative for fatigue and fever.  HENT:  Negative for tinnitus and trouble swallowing.   Respiratory:  Negative for cough and shortness of breath.   Cardiovascular:  Negative for chest pain.  Gastrointestinal:  Negative for abdominal pain.  Musculoskeletal:  Negative for back pain.  Neurological:  Positive for weakness. Negative for dizziness, tremors, seizures, syncope, facial asymmetry, speech difficulty, light-headedness, numbness and headaches.  Psychiatric/Behavioral:  Negative for behavioral problems, confusion and decreased concentration.     Vital Signs: BP (!) 170/91   Pulse 73   Temp (!) 97.5 F (36.4 C) (Oral)   Resp 18   Ht 5\' 9"  (1.753 m)   Wt 210 lb (95.3 kg)   SpO2 98%   BMI 31.01 kg/m    Physical Exam Vitals reviewed.  HENT:     Mouth/Throat:     Mouth: Mucous membranes are moist.  Eyes:     Extraocular Movements:  Extraocular movements intact.  Cardiovascular:     Rate and Rhythm: Normal rate and regular rhythm.     Heart sounds: Normal heart sounds.  Pulmonary:     Breath sounds: Normal breath sounds.  Abdominal:     Palpations: Abdomen is soft.  Musculoskeletal:        General: Normal range of motion.     Comments: =strength  Skin:    General: Skin is warm.  Neurological:     Mental Status: He is alert and oriented to person, place, and time.  Psychiatric:        Behavior: Behavior normal.     Imaging: IR Radiologist Eval & Mgmt  Result Date: 12/21/2021 EXAM: NEW PATIENT OFFICE VISIT CHIEF COMPLAINT: History of TIA.  Abnormal CT angiogram of the head and neck. Current Pain Level: 1-10 HISTORY OF PRESENT ILLNESS: 76 year old right handed male who presents for evaluation accompanied by his daughter, 62. 11/25/2018 patient presented with symptoms of left facial droop, left grip weakness and dysarthria. Stroke workup at that time involved an of the MRI brain, a CT angiogram of the head and neck. CT angiogram of the head and neck revealed multifocal extra cranial and intracranial stenosis, more specifically of the right internal carotid artery intracranially. MRI of the brain revealed the focal areas of diffusion weighted restriction involving the right frontal subcortical areas. Patient was started on aspirin 81 mg a day, and Plavix 75 mg a day. Patient reports no similar like symptoms. On specific questioning, the patient denies any unusual headaches, nausea, vomiting, visual symptoms of amaurosis fugax, or double vision or blurred vision. Denies any difficulty with speech, swallowing solids or liquids, limb paresthesias or leg weakness. Denies any symptoms or episodes of loss of awareness, or seizure-like activity. Additionally, he denies any shortness of breath or dyspnea, ankle edema or paroxysmal nocturnal dyspnea. He denies any coughing, wheezing or sputum production. He also denies any  difficulty with abdominal pain, constipation, diarrhea or melena. Denies any dysuria, hematuria, or polyuria. Denies any recent chills, fever or rigors. Diagnosis * : Date . * : Arthritis * : . * : Chronic HFrEF (heart failure with reduced ejection fraction) (HCC) * : * : a. 10/2020 Echo: EF 25-30%, nl RV fxn, mild MR. . * : CKD (chronic kidney disease), stage III (HCC) * : . * : Coronary artery disease * : * : a. 2002 s/p CABG x 2: LIMA->LAD, VG->RPDA; b. 11/2020 Cath: LM 20ost,  LAD 127m, LCX 99p/m, 33m/d, OM1 30, RCA 100p - fills via collats from LAD. RPL1 fills via collats from LCX. VG->PDA 100, LIMA->LAD ok-->med rx. . * : Gout * : . * : Hyperlipidemia LDL goal <70 * : . * : Hypertension * : . * : Ischemic cardiomyopathy * : * : a. 10/2020 Echo: EF 25-30%. . * : Osteoarthritis * : Past Surgical History: Procedure * : Laterality * : Date . * : CARDIAC SURGERY * : * : * : CABG 2002 . * : LAPAROSCOPIC APPENDECTOMY * : N/A * : 08/11/2018 * : Procedure: APPENDECTOMY LAPAROSCOPIC; Surgeon: Henrene Dodge, MD; Location: [facility] ORS; Service: General; Laterality: N/A; . * : RIGHT/LEFT HEART CATH AND CORONARY/GRAFT ANGIOGRAPHY * : N/A * : 11/15/2020 * : Procedure: RIGHT/LEFT HEART CATH AND CORONARY/GRAFT ANGIOGRAPHY; Surgeon: Iran Ouch, MD; Location: [facility] INVASIVE CV LAB; Service: Cardiovascular; Laterality: N/A; History reviewed. No pertinent family history. Socioeconomic History . * : Marital status: * : Widowed * : * : Spouse name: * : Not on file . * : Number of children: * : Not on file . * : Years of education: * : Not on file . * : Highest education level: * : Not on file Occupational History . * : Not on file Tobacco Use . * : Smoking status: * : Former . * : Smokeless tobacco: * : Never Vaping Use . * : Vaping Use: * : Never used Substance and Sexual Activity . * : Alcohol use: * : No . * : Drug use: * : No . * : Sexual activity: * : Not on file Other Topics * : Concern . * : Not on file Social History Narrative  . * : Not on file Financial Resource Strain: Low Risk  (11/25/2020) * : Overall Financial Resource Strain (CARDIA) * : . * : Difficulty of Paying Living Expenses: Not very hard Food Insecurity: No Food Insecurity (11/25/2020) * : Hunger Vital Sign * : . * : Worried About Running Out of Food in the Last Year: Never true * : . * : Ran Out of Food in the Last Year: Never true Transportation Needs: No Transportation Needs (11/25/2020) * : PRAPARE - Transportation * : . * : Lack of Transportation (Medical): No * : . * : Lack of Transportation (Non-Medical): No Physical Activity: Insufficiently Active (11/25/2020) * : Exercise Vital Sign * : . * : Days of Exercise per Week: 2 days * : . * : Minutes of Exercise per Session: 20 min Stress: Stress Concern Present (11/25/2020) * : [employer] of [employer]ational [employer] - Occupational Stress Questionnaire * : . * : Feeling of Stress : To some extent Social Connections: Socially Isolated (11/25/2020) * : Social Connection and Isolation Panel NHANES * : . * : Frequency of Communication with Friends and Family: More than three times a week * : . * : Frequency of Social Gatherings with Friends and Family: More than three times a week * : . * : Attends Religious Services: Never * : . * : Active Member of Clubs or Organizations: No * : . * : Attends Club or Organization Meetings: Never * : . * : Marital Status: Widowed No Known Alleriges. Medications: aspirin EC 81 MG tablet take 1 tablet (81 mg total) by mouth daily. Swallow whole. atorvastatin 40 MG tablet commonly known as: LIPITOR TAKE 1 TABLET BY MOUTH EVERY DAY carvedilol 3.125 MG tablet commonly known as: COREG TAKE  1 TABLET (3.125 MG TOTAL) BY MOUTH 2 (TWO) TIMES DAILY WITH A MEAL. clopidogrel 75 MG tablet commonly known as: PLAVIX take 1 tablet (75 mg total) by mouth daily. colchicine 0.6 MG tablet take 2 tabs p.o. x1 then take 1 tab p.o. 1 hour later for gout attack. dapagliflozin propanediol 10 MG Tabs tablet commonly  known as: Marcelline Deist take 1 tablet (10 mg total) by mouth daily. furosemide 20 MG tablet commonly known as: LASIX take 1 tablet (20 mg total) by mouth daily as needed (As needed for weight gain of 2 pounds overnight). hydrALAZINE 50 MG tablet commonly known as: APRESOLINE TAKE 1 TABLET BY MOUTH THREE TIMES A DAY isosorbide mononitrate 30 MG 24 hr tablet commonly known as: IMDUR take 1 tablet (30 mg total) by mouth daily. sacubitril-valsartan 24-26 MG commonly known as: ENTRESTO take 1 tablet by mouth 2 (two) times daily. Sphygmomanometer Misc 1 each by Does not apply route daily. REVIEW OF SYSTEMS: Negative unless as mentioned above. PHYSICAL EXAMINATION: Patient is awake, alert, oriented to time, place, space, appropriately responsive speech and comprehension intact. Normal eye contact. No gross lateralizing abnormal neurological features noted. Station and gait reveals no staggering, or short steps or loss of balance. ASSESSMENT AND PLAN: Findings on the CT angiogram of the head and neck were reviewed with the patient and the patient's daughter. They were informed of the multifocal areas of stenosis extra cranially and intracranially but most notably the right internal carotid artery intracranially. Given the patient's clinical findings, and neuroimaging studies, it was felt the patient needs a formal catheter arteriogram in order to evaluate more accurately the respective areas of stenosis. On the findings of the diagnostic catheter arteriogram further management considerations will be discussed including endovascular revascularization to prevent future ischemic events. The procedure was reviewed in detail. The diagnostic catheter arteriogram would be under conscious sedation via either trans radial or transfemoral route. The results of the study would be discussed with him and his daughter right after the procedure. Patient advised to maintain adequate hydration. Patient is agreeable to proceed with the diagnostic  catheter arteriogram. This will be scheduled at the earliest. Patient and daughter were asked to call for any concerns or questions. Electronically Signed   By: Julieanne Cotton M.D.   On: 12/21/2021 08:00    Labs:  CBC: Recent Labs    06/01/21 1001 11/24/21 1254 11/25/21 0745  WBC 8.3 9.1 7.5  HGB 12.7* 12.1* 11.7*  HCT 41.4 39.5 38.1*  PLT 363 373 342    COAGS: Recent Labs    11/24/21 1254  INR 1.0  APTT 44*    BMP: Recent Labs    08/13/21 0952 11/15/21 1324 11/24/21 1254 11/25/21 0745  NA 137 140 139 141  K 4.2 4.2 4.4 3.7  CL 107 112* 108 109  CO2 19* GLUCOSE 100* 93 117* 102*  BUN CALCIUM 9.2 9.3 9.0 8.9  CREATININE 1.37* 1.73* 1.27* 1.25*  GFRNONAA 53* 40* 58* 59*    LIVER FUNCTION TESTS: Recent Labs    06/01/21 1001 11/24/21 1254  BILITOT 0.6 0.5  AST 18 13*  ALT 11 12  ALKPHOS 56 46  PROT 7.2 7.8  ALBUMIN 3.5 3.9    TUMOR MARKERS: No results for input(s): "AFPTM", "CEA", "CA199", "CHROMGRNA" in the last 8760 hours.  Assessment and Plan:  Scheduled for Cerebral arteriogram to fully evaluate stenosis Risks and benefits of cerebral angiogram with intervention were discussed with  the patient including, but not limited to bleeding, infection, vascular injury, contrast induced renal failure, stroke or even death.  This interventional procedure involves the use of X-rays and because of the nature of the planned procedure, it is possible that we will have prolonged use of X-ray fluoroscopy.  Potential radiation risks to you include (but are not limited to) the following: - A slightly elevated risk for cancer  several years later in life. This risk is typically less than 0.5% percent. This risk is low in comparison to the normal incidence of human cancer, which is 33% for women and 50% for men according to the American Cancer Society. - Radiation induced injury can include skin redness, resembling a rash, tissue breakdown /  ulcers and hair loss (which can be temporary or permanent).   The likelihood of either of these occurring depends on the difficulty of the procedure and whether you are sensitive to radiation due to previous procedures, disease, or genetic conditions.   IF your procedure requires a prolonged use of radiation, you will be notified and given written instructions for further action.  It is your responsibility to monitor the irradiated area for the 2 weeks following the procedure and to notify your physician if you are concerned that you have suffered a radiation induced injury.    All of the patient's questions were answered, patient is agreeable to proceed.  Consent signed and in chart.  Thank you for this interesting consult.  I greatly enjoyed meeting Irena ReichmannJames C Carriger and look forward to participating in their care.  A copy of this report was sent to the requesting provider on this date.  Electronically Signed: Robet LeuPamela A Julio Storr, [name] 01/03/2022, 8:56 AM   I spent a total of    25 Minutes in face to face in clinical consultation, greater than 50% of which was counseling/coordinating care for cerebral arteriogrm

## 2022-01-03 NOTE — Progress Notes (Signed)
TR BAND REMOVAL  LOCATION:    right radial  DEFLATED PER PROTOCOL:    Yes.    TIME BAND OFF / DRESSING APPLIED:    13:00   SITE UPON ARRIVAL:    Level 0  SITE AFTER BAND REMOVAL:    Level 0  CIRCULATION SENSATION AND MOVEMENT:    Within Normal Limits   Yes.    COMMENTS:   No bleeding noted  

## 2022-01-10 ENCOUNTER — Other Ambulatory Visit (HOSPITAL_COMMUNITY): Payer: Self-pay | Admitting: Interventional Radiology

## 2022-01-10 DIAGNOSIS — I6521 Occlusion and stenosis of right carotid artery: Secondary | ICD-10-CM

## 2022-01-13 ENCOUNTER — Other Ambulatory Visit (HOSPITAL_COMMUNITY): Payer: Self-pay | Admitting: Radiology

## 2022-01-13 ENCOUNTER — Other Ambulatory Visit (HOSPITAL_COMMUNITY)
Admission: RE | Admit: 2022-01-13 | Discharge: 2022-01-13 | Disposition: A | Discharge status: Home / Self Care | Payer: Medicare HMO | Attending: Interventional Radiology | Admitting: Interventional Radiology

## 2022-01-13 DIAGNOSIS — I6521 Occlusion and stenosis of right carotid artery: Secondary | ICD-10-CM

## 2022-01-24 ENCOUNTER — Encounter (HOSPITAL_COMMUNITY): Payer: Self-pay | Admitting: Interventional Radiology

## 2022-01-24 ENCOUNTER — Other Ambulatory Visit: Payer: Self-pay

## 2022-01-24 ENCOUNTER — Other Ambulatory Visit: Payer: Self-pay | Admitting: Physician Assistant

## 2022-01-24 ENCOUNTER — Other Ambulatory Visit: Payer: Self-pay | Admitting: Student

## 2022-01-24 DIAGNOSIS — G459 Transient cerebral ischemic attack, unspecified: Secondary | ICD-10-CM

## 2022-01-24 MED ORDER — DEXTROSE 5 % IV SOLN
2.0000 g | INTRAVENOUS | Status: AC
  Administered 2022-01-25: 2 g via INTRAVENOUS

## 2022-01-24 NOTE — Progress Notes (Addendum)
This Clinical research associate called the patient with questions and instructions for the surgery day. The patient asked this Clinical research associate to talk with his daughter in law, Mrs. Margo Aye, [place].   PCP - Corky Downs, MD Cardiologist - Clarisa Kindred, [name]  PPM/ICD - denies  Chest x-ray - 06/01/21 EKG - 11/24/21 ECHO - 11/11/20 Cardiac Cath - 11/15/20  CPAP - n/a  Fasting Blood Sugar - n/a  Blood Thinner Instructions: Plavix - continue Aspirin Instructions - continue Patient was instructed: As of today, STOP taking any Aspirin (unless otherwise instructed by your surgeon) Aleve, Naproxen, Ibuprofen, Motrin, Advil, Goody's, BC's, all herbal medications, fish oil, and all vitamins.  ERAS Protcol - n/a  COVID TEST- n/a  Anesthesia review: yes - cardiac history  Patient verbally denies any shortness of breath, fever, cough and chest pain during phone call   -------------  SDW INSTRUCTIONS given:  Your procedure is scheduled on Wednesday, December 13th, 2023.  Report to [facility] Main Entrance "A" at 06:00 A.M., and check in at the Admitting office.  Call this number if you have problems the morning of surgery:  [phone]   Remember:  Do not eat or drink after midnight the night before your surgery    Take these medicines the morning of surgery with A SIP OF WATER - Allopurinol, Lipitor, Coreg, Hydralazine, Imdur, Plavix, Aspirin  Hold Farxiga 24 hrs prior to surgery   The day of surgery:                     Do not wear jewelry,             Do not wear lotions, powders, colognes, or deodorant.            Men may shave face and neck.            Do not bring valuables to the hospital.            [facility] is not responsible for any belongings or valuables.  Do NOT Smoke (Tobacco/Vaping) 24 hours prior to your procedure If you use a CPAP at night, you may bring all equipment for your overnight stay.   Contacts, glasses, dentures or bridgework may not be worn into surgery.      For patients  admitted to the hospital, discharge time will be determined by your treatment team.   Patients discharged the day of surgery will not be allowed to drive home, and someone needs to stay with them for 24 hours.    Special instructions:   [place]- Preparing For Surgery  Before surgery, you can play an important role. Because skin is not sterile, your skin needs to be as free of germs as possible. You can reduce the number of germs on your skin by washing with CHG (chlorahexidine gluconate) Soap before surgery.  CHG is an antiseptic cleaner which kills germs and bonds with the skin to continue killing germs even after washing.    Oral Hygiene is also important to reduce your risk of infection.  Remember - BRUSH YOUR TEETH THE MORNING OF SURGERY WITH YOUR REGULAR TOOTHPASTE  Please do not use if you have an allergy to CHG or antibacterial soaps. If your skin becomes reddened/irritated stop using the CHG.  Do not shave (including legs and underarms) for at least 48 hours prior to first CHG shower. It is OK to shave your face.  Please follow these instructions carefully.   Shower the [employer] [facility] and the  MORNING OF SURGERY with DIAL Soap.   Pat yourself dry with a CLEAN TOWEL.  Wear CLEAN PAJAMAS to bed the night before surgery  Place CLEAN SHEETS on your bed the night of your first shower and DO NOT SLEEP WITH PETS.   Day of Surgery: Please shower morning of surgery  Wear Clean/Comfortable clothing the morning of surgery Do not apply any deodorants/lotions.   Remember to brush your teeth WITH YOUR REGULAR TOOTHPASTE.   Questions were answered. Patient verbalized understanding of instructions.

## 2022-01-24 NOTE — Anesthesia Preprocedure Evaluation (Signed)
Anesthesia Evaluation  Patient identified by MRN, date of birth, ID band Patient awake    Reviewed: Allergy & Precautions, NPO status , Patient's Chart, lab work & pertinent test results  Airway Mallampati: I  TM Distance: >3 FB Neck ROM: Full    Dental  (+) Missing, Poor Dentition, Chipped   Pulmonary former smoker    + decreased breath sounds      Cardiovascular hypertension, Pt. on home beta blockers and Pt. on medications + CAD, + CABG and +CHF   Rhythm:Regular Rate:Normal  Echo: 1. Left ventricular ejection fraction, by estimation, is 25 to 30%. The  left ventricle has severely decreased function. The left ventricle  demonstrates global hypokinesis. The left ventricular internal cavity size  was mildly to moderately dilated. Left  ventricular diastolic parameters were normal.   2. Right ventricular systolic function is normal. The right ventricular  size is normal.   3. The mitral valve is normal in structure. Mild mitral valve  regurgitation.   4. The aortic valve is normal in structure. Aortic valve regurgitation is  not visualized.     Neuro/Psych TIA negative psych ROS   GI/Hepatic negative GI ROS, Neg liver ROS,,,  Endo/Other  negative endocrine ROS    Renal/GU Renal disease     Musculoskeletal   Abdominal   Peds  Hematology negative hematology ROS (+)   Anesthesia Other Findings   Reproductive/Obstetrics                             Anesthesia Physical Anesthesia Plan  ASA: 4  Anesthesia Plan: General   Post-op Pain Management:    Induction: Intravenous  PONV Risk Score and Plan: 3 and Ondansetron, Dexamethasone and Midazolam  Airway Management Planned: Oral ETT  Additional Equipment: Arterial line  Intra-op Plan:   Post-operative Plan: Extubation in OR  Informed Consent: I have reviewed the patients History and Physical, chart, labs and discussed the  procedure including the risks, benefits and alternatives for the proposed anesthesia with the patient or authorized representative who has indicated his/her understanding and acceptance.     Consent reviewed with POA  Plan Discussed with: CRNA  Anesthesia Plan Comments: (PAT note written 01/24/2022 by Shonna Chock, [name].  Awaited [name] consent and plan @ 0825 prior to placing arterial line.  )       Anesthesia Quick Evaluation

## 2022-01-24 NOTE — Progress Notes (Signed)
Anesthesia Chart Review: Maury Dus  Case: [id number] Date/Time: 01/25/22 0815   Procedure: Cerebral angioplasty with possible stenting   Anesthesia type: General   Pre-op diagnosis: Stenosis I77.1   [facility] RAD[facility] / [facility]   Surgeons: Julieanne Cotton, MD       DISCUSSION: Patient is a 76 year old male scheduled for the above procedure. Bilateral common carotid arteriogram and left vertebral artery angiogram on 01/03/2022 showed severe preocclusive stenosis of the right ICA petrous segment, approximately 50 to 60% stenosis of the left MCA M1 segment, and approximately 25% stenosis of the proximal basilar artery.    Other history includes CAD (s/p CABG 2002), ischemic cardiomyopathy, HFrEF, CKD (stage III), CVA (TIA 11/25/20; right frontal and parietal lobe infarct 11/14/21, out-patient endovascular intervention for R ICA stenosis recommended), appendectomy (08/11/18).  He has a history of CABG in 2002, but had not seen cardiology in a while until admission 11/11/20-11/17/20 for acute dyspnea with worsening LE edema and diagnosed with acute systolic CHF and viral bronchitis. Echo showed LVEF 25-30% with global hypokinesis.He underwent a cardiac cath on 11/14/20 that showed significant 3V CAD with occluded mLAD, pRCA, and subtotal occlusion mLCX after the origin of a large OM1 which supplied the majority of the LCX distribution. He had a patent LIMA-LAD but occluded SVG-RCA and SVG-OM. The RCA got left-to-right collaterals. RHC showed high normal filling pressures, moderate pulmonary hypertension, and moderately to severely reduced cardiac output. No revascularization recommended for CAD, continue medical therapy.   He last saw primary cardiologist Dr. Mariah Milling on 02/16/21 with continued medical therapy recommended. He has more recently been followed at that [facility] by Clarisa Kindred, [name], last 11/15/21 for follow-up HFrEF, NYHA class II. Euvolemic at that time, but weight up 3 lbs  from 3 months prior. On Coreg and [place]. Entesto added. One month follow-up recommended.   He did not get an echo during his 11/2021 CVA admission.   Last echo on 11/11/20 showed decreased LV function with EF 25-30%, LV global hypokinesis, LV cavity mildly-moderately dilated, normal LV diastolic parameters, normal RVSF, mild MR. By current medication list HF medications include Coreg 3.125 mg BID, Farxiga 10 mg daily, Lasix 20 mg daily as needed, hydralazine 50 mg TID, Imdur 30 mg daily. He is not currently on Entresto or spironolactone by medication list--some medications were at least held temporarily during his CVA admission for permissive HTN.  He is on ASA and Plavix for procedure.   Reviewed above with anesthesiologist Ardeen Jourdain, MD. Recommendation to discuss with cardiology for any additional preoperative input; However, since he is a same day work-up and procedure is in setting RICA stenosis with recent CVA, anesthesiologist will assess on the day of surgery definitive plan at that time following assessment. I did call and speak with Clarisa Kindred, [name] [phone]) at the [facility]. She was unaware that Mr. Preece had a recent CVA. Since she has not assessed him since his CVA, she could not provide specific pre-procedure cardiology input other than acknowledges his anesthesia team and IR will assess on the day of procedure for any acute or worsening changes and definitive plan made at that time. I did notify her that Sherryll Burger is no longer on his medication. She does plan to have her staff follow-up with patient to scheduled follow-up in the near future and can discuss timing of future echo.    VS:  BP Readings from Last 3 Encounters:  01/03/22 (!) 152/80  12/05/21 136/73  11/25/21 (!) 169/102   Pulse Readings from Last 3 Encounters:  01/03/22 (!) 58  12/05/21 68  11/25/21 76     PROVIDERS: Corky DownsMasoud, [name], MD is PCP. Last visit 12/05/21.  Julien NordmannGollan, Timothy, MD is primary  cardiologist. Last visit 02/16/21. Denied recent anginal symptoms. Medical therapy recommended after 11/15/20 cath.  [name], [name], [name] is HF cardiology provider    LABS: On day of procedure as indicated. Most recent lab results include: Lab Results  Component Value Date   WBC 6.7 01/03/2022   HGB 12.4 (L) 01/03/2022   HCT 41.5 01/03/2022   PLT 379 01/03/2022   GLUCOSE 110 (H) 01/03/2022   CHOL 112 11/25/2021   TRIG 108 11/25/2021   HDL 26 (L) 11/25/2021   LDLCALC 64 11/25/2021   ALT 12 11/24/2021   AST 13 (L) 11/24/2021   NA 139 01/03/2022   K 4.3 01/03/2022   CL 107 01/03/2022   CREATININE 1.40 (H) 01/03/2022   BUN 19 01/03/2022   CO2 23 01/03/2022   INR 1.0 01/03/2022   HGBA1C 6.4 (H) 11/24/2021     IMAGES: IR  Bilateral common carotid arteriogram and left vertebral artery angiogram 01/03/22: IMPRESSION: - Severe pre occlusive stenosis of the distal horizontal petrous segment of the right internal carotid artery. - Approximately 50% stenosis of the proximal M1 segment of the left middle cerebral artery. - Approximately 30% stenosis of the left vertebrobasilar junction just distal to the origin of the left posterior-inferior cerebellar artery, and of a 50% stenosis just proximal to the origin of the basilar artery. - Diffuse intracranial arteriosclerotic changes involving the anterior and the middle cerebral arteries bilaterally and of the basilar artery, and the left posterior-inferior cerebellar artery.   MRI Brain 11/24/21: IMPRESSION: Small linear region of acute infarct in the right frontal lobe and additional punctate focus of acute infarct in the right parietal lobe.  CTA Head/Neck 11/24/21: IMPRESSION: 1. Severe stenosis of the non dominant right vertebral artery origin with intermittent non opacification throughout the neck and non opacification intradurally. 2. Severe stenosis of the proximal left V2 vertebral artery with more distal opacification.  Also, severe stenosis of the intradural left vertebral artery. 3. Severe stenosis of the distal petrous right ICA and moderate stenosis of the right paraclinoid ICA. 4. Severe stenosis of bilateral P2 PCAs. 5. Hypoplastic right A1 ACA with probable superimposed severe stenosis. 6. Moderate distal ACA stenosis. 7. Approximately 40% stenosis of the proximal right ICA in the neck. 8. Approximately 50% stenosis of the left common carotid artery.  CXR 06/01/21: FINDINGS: Sequelae of CABG are again identified. The cardiac silhouette is smaller than on the prior study and is currently within normal limits. The lungs are mildly hyperinflated with slight coarsening of the interstitial markings. No airspace consolidation, edema, pleural effusion, or pneumothorax is identified. No acute osseous abnormality is seen. IMPRESSION: No active cardiopulmonary disease.   EKG: 11/24/21: Sinus rhythm Atrial premature complexes Prolonged PR interval [PR 234 ms] Borderline right axis deviation  Probable left ventricular hypertrophy Borderline T abnormalities, lateral leads   CV: Cardiac cath 11/15/20:   Suezanne Jacquetst LM lesion is 20% stenosed.   Prox Cx to Mid Cx lesion is 99% stenosed.   Mid Cx to Dist Cx lesion is 99% stenosed.   1st Mrg lesion is 30% stenosed.   Mid LAD lesion is 100% stenosed.   Prox RCA lesion is 100% stenosed.   Origin lesion is 100% stenosed.   SVG.   LIMA graft was visualized by  angiography and is normal in caliber.   The graft exhibits no disease.   1.  Significant underlying three-vessel coronary artery disease with occluded mid LAD, occluded proximal right coronary artery and subtotal occlusion of the mid left circumflex after the origin of a large high OM1 which supplies the majority of the left circumflex distribution. Patent LIMA to LAD with occluded SVG to RCA and SVG to OM. The RCA gets left-to-right collaterals. 2.  Left ventricular angiography was not performed due to  chronic kidney disease. 3.  Right heart catheterization showed high normal filling pressures, moderate pulmonary hypertension and moderately to severely reduced cardiac output.   Recommendations: No revascularization is recommended for coronary artery disease.  Recommend continuing medical therapy.   Echo 11/11/20: IMPRESSIONS   1. Left ventricular ejection fraction, by estimation, is 25 to 30%. The  left ventricle has severely decreased function. The left ventricle  demonstrates global hypokinesis. The left ventricular internal cavity size  was mildly to moderately dilated. Left  ventricular diastolic parameters were normal.   2. Right ventricular systolic function is normal. The right ventricular  size is normal.   3. The mitral valve is normal in structure. Mild mitral valve  regurgitation.   4. The aortic valve is normal in structure. Aortic valve regurgitation is  not visualized.    Past Medical History:  Diagnosis Date   Arthritis    CHF (congestive heart failure) (HCC)    Chronic HFrEF (heart failure with reduced ejection fraction) (HCC)    a. 10/2020 Echo: EF 25-30%, nl RV fxn, mild MR.   CKD (chronic kidney disease), stage III (HCC)    Coronary artery disease    a. 2002 s/p CABG x 2: LIMA->LAD, VG->RPDA; b. 11/2020 Cath: LM 20ost, LAD 117m, LCX 99p/m, 106m/d, OM1 30, RCA 100p - fills via collats from LAD. RPL1 fills via collats from LCX. VG->PDA 100, LIMA->LAD ok-->med rx.   Gout    Hyperlipidemia LDL goal <70    Hypertension    Ischemic cardiomyopathy    a. 10/2020 Echo: EF 25-30%.   Osteoarthritis     Past Surgical History:  Procedure Laterality Date   CARDIAC CATHETERIZATION     CARDIAC SURGERY     CABG 2002   IR ANGIO EXTRACRAN SEL COM CAROTID INNOMINATE UNI BILAT MOD SED  01/03/2022   IR ANGIO VERTEBRAL SEL VERTEBRAL UNI L MOD SED  01/03/2022   IR RADIOLOGIST EVAL & MGMT  12/21/2021   IR US GUIDE VASC ACCESS RIGHT  01/03/2022   LAPAROSCOPIC APPENDECTOMY N/A  08/11/2018   Procedure: APPENDECTOMY LAPAROSCOPIC;  Surgeon: Henrene Dodge, MD;  Location: [facility] ORS;  Service: General;  Laterality: N/A;   RIGHT/LEFT HEART CATH AND CORONARY/GRAFT ANGIOGRAPHY N/A 11/15/2020   Procedure: RIGHT/LEFT HEART CATH AND CORONARY/GRAFT ANGIOGRAPHY;  Surgeon: Iran Ouch, MD;  Location: [facility] INVASIVE CV LAB;  Service: Cardiovascular;  Laterality: N/A;    MEDICATIONS: No current facility-administered medications for this encounter.    allopurinol (ZYLOPRIM) 100 MG tablet   aspirin EC 81 MG tablet   atorvastatin (LIPITOR) 40 MG tablet   carvedilol (COREG) 3.125 MG tablet   clopidogrel (PLAVIX) 75 MG tablet   colchicine 0.6 MG tablet   dapagliflozin propanediol (FARXIGA) 10 MG TABS tablet   furosemide (LASIX) 20 MG tablet   hydrALAZINE (APRESOLINE) 50 MG tablet   isosorbide mononitrate (IMDUR) 30 MG 24 hr tablet   Blood Pressure Monitoring (SPHYGMOMANOMETER) MISC     Shonna Chock, [name] Surgical Short Stay/Anesthesiology [facility] Phone [phone] [facility] Phone [phone] 01/24/2022 4:51 PM

## 2022-01-25 ENCOUNTER — Inpatient Hospital Stay (HOSPITAL_COMMUNITY): Payer: Medicare HMO | Admitting: Vascular Surgery

## 2022-01-25 ENCOUNTER — Inpatient Hospital Stay (HOSPITAL_COMMUNITY)
Admission: RE | Admit: 2022-01-25 | Discharge: 2022-01-25 | Disposition: A | Discharge status: Home / Self Care | Payer: Medicare HMO | Source: Home / Self Care | Attending: Interventional Radiology | Admitting: Interventional Radiology

## 2022-01-25 ENCOUNTER — Other Ambulatory Visit: Payer: Self-pay

## 2022-01-25 ENCOUNTER — Encounter (HOSPITAL_COMMUNITY)
Admission: RE | Disposition: A | Discharge status: Home / Self Care | Payer: Self-pay | Source: Home / Self Care | Attending: Interventional Radiology

## 2022-01-25 ENCOUNTER — Encounter (HOSPITAL_COMMUNITY): Payer: Self-pay | Admitting: Interventional Radiology

## 2022-01-25 ENCOUNTER — Inpatient Hospital Stay (HOSPITAL_COMMUNITY)
Admission: RE | Admit: 2022-01-25 | Discharge: 2022-01-26 | DRG: 026 | Disposition: A | Discharge status: Home / Self Care | Payer: Medicare HMO | Attending: Interventional Radiology | Admitting: Interventional Radiology

## 2022-01-25 DIAGNOSIS — I2510 Atherosclerotic heart disease of native coronary artery without angina pectoris: Secondary | ICD-10-CM

## 2022-01-25 DIAGNOSIS — I255 Ischemic cardiomyopathy: Secondary | ICD-10-CM | POA: Diagnosis present

## 2022-01-25 DIAGNOSIS — Z7902 Long term (current) use of antithrombotics/antiplatelets: Secondary | ICD-10-CM

## 2022-01-25 DIAGNOSIS — Z8673 Personal history of transient ischemic attack (TIA), and cerebral infarction without residual deficits: Secondary | ICD-10-CM

## 2022-01-25 DIAGNOSIS — Z87891 Personal history of nicotine dependence: Secondary | ICD-10-CM | POA: Diagnosis not present

## 2022-01-25 DIAGNOSIS — Z79899 Other long term (current) drug therapy: Secondary | ICD-10-CM

## 2022-01-25 DIAGNOSIS — M109 Gout, unspecified: Secondary | ICD-10-CM | POA: Diagnosis present

## 2022-01-25 DIAGNOSIS — N183 Chronic kidney disease, stage 3 unspecified: Secondary | ICD-10-CM | POA: Diagnosis not present

## 2022-01-25 DIAGNOSIS — I6521 Occlusion and stenosis of right carotid artery: Secondary | ICD-10-CM

## 2022-01-25 DIAGNOSIS — Z9862 Peripheral vascular angioplasty status: Secondary | ICD-10-CM

## 2022-01-25 DIAGNOSIS — I272 Pulmonary hypertension, unspecified: Secondary | ICD-10-CM | POA: Diagnosis present

## 2022-01-25 DIAGNOSIS — I110 Hypertensive heart disease with heart failure: Secondary | ICD-10-CM

## 2022-01-25 DIAGNOSIS — G459 Transient cerebral ischemic attack, unspecified: Secondary | ICD-10-CM

## 2022-01-25 DIAGNOSIS — I6611 Occlusion and stenosis of right anterior cerebral artery: Secondary | ICD-10-CM

## 2022-01-25 DIAGNOSIS — I130 Hypertensive heart and chronic kidney disease with heart failure and stage 1 through stage 4 chronic kidney disease, or unspecified chronic kidney disease: Secondary | ICD-10-CM | POA: Diagnosis present

## 2022-01-25 DIAGNOSIS — Z7982 Long term (current) use of aspirin: Secondary | ICD-10-CM

## 2022-01-25 DIAGNOSIS — I509 Heart failure, unspecified: Secondary | ICD-10-CM

## 2022-01-25 DIAGNOSIS — Z951 Presence of aortocoronary bypass graft: Secondary | ICD-10-CM

## 2022-01-25 DIAGNOSIS — I5022 Chronic systolic (congestive) heart failure: Secondary | ICD-10-CM | POA: Diagnosis present

## 2022-01-25 DIAGNOSIS — E785 Hyperlipidemia, unspecified: Secondary | ICD-10-CM | POA: Diagnosis present

## 2022-01-25 DIAGNOSIS — I672 Cerebral atherosclerosis: Secondary | ICD-10-CM | POA: Diagnosis present

## 2022-01-25 HISTORY — PX: RADIOLOGY WITH ANESTHESIA: SHX6223

## 2022-01-25 HISTORY — PX: IR INTRA CRAN STENT: IMG2345

## 2022-01-25 HISTORY — PX: IR US GUIDE VASC ACCESS RIGHT: IMG2390

## 2022-01-25 LAB — POCT ACTIVATED CLOTTING TIME
Activated Clotting Time: 174 seconds
Activated Clotting Time: 174 seconds
Activated Clotting Time: 179 seconds

## 2022-01-25 LAB — CBC WITH DIFFERENTIAL/PLATELET
Abs Immature Granulocytes: 0.03 10*3/uL (ref 0.00–0.07)
Basophils Absolute: 0 10*3/uL (ref 0.0–0.1)
Basophils Relative: 1 %
Eosinophils Absolute: 0.3 10*3/uL (ref 0.0–0.5)
Eosinophils Relative: 4 %
HCT: 36.4 % — ABNORMAL LOW (ref 39.0–52.0)
Hemoglobin: 11.2 g/dL — ABNORMAL LOW (ref 13.0–17.0)
Immature Granulocytes: 1 %
Lymphocytes Relative: 23 %
Lymphs Abs: 1.5 10*3/uL (ref 0.7–4.0)
MCH: 24.9 pg — ABNORMAL LOW (ref 26.0–34.0)
MCHC: 30.8 g/dL (ref 30.0–36.0)
MCV: 81.1 fL (ref 80.0–100.0)
Monocytes Absolute: 0.9 10*3/uL (ref 0.1–1.0)
Monocytes Relative: 13 %
Neutro Abs: 4 10*3/uL (ref 1.7–7.7)
Neutrophils Relative %: 58 %
Platelets: 380 10*3/uL (ref 150–400)
RBC: 4.49 MIL/uL (ref 4.22–5.81)
RDW: 16.7 % — ABNORMAL HIGH (ref 11.5–15.5)
WBC: 6.7 10*3/uL (ref 4.0–10.5)
nRBC: 0 % (ref 0.0–0.2)

## 2022-01-25 LAB — BASIC METABOLIC PANEL
Anion gap: 6 (ref 5–15)
BUN: 18 mg/dL (ref 8–23)
CO2: 24 mmol/L (ref 22–32)
Calcium: 8.8 mg/dL — ABNORMAL LOW (ref 8.9–10.3)
Chloride: 108 mmol/L (ref 98–111)
Creatinine, Ser: 1.48 mg/dL — ABNORMAL HIGH (ref 0.61–1.24)
GFR, Estimated: 48 mL/min — ABNORMAL LOW (ref >60)
Glucose, Bld: 108 mg/dL — ABNORMAL HIGH (ref 70–99)
Potassium: 4.4 mmol/L (ref 3.5–5.1)
Sodium: 138 mmol/L (ref 135–145)

## 2022-01-25 LAB — APTT: aPTT: 167 seconds (ref 24–36)

## 2022-01-25 LAB — HEPARIN LEVEL (UNFRACTIONATED): Heparin Unfractionated: <0.1 IU/mL — ABNORMAL LOW (ref 0.30–0.70)

## 2022-01-25 LAB — PROTIME-INR
INR: 1 (ref 0.8–1.2)
Prothrombin Time: 13.2 seconds (ref 11.4–15.2)

## 2022-01-25 SURGERY — IR WITH ANESTHESIA
Anesthesia: General

## 2022-01-25 MED ORDER — ONDANSETRON HCL 4 MG/2ML IJ SOLN
INTRAMUSCULAR | Status: DC | PRN
  Administered 2022-01-25: 4 mg via INTRAVENOUS

## 2022-01-25 MED ORDER — CLOPIDOGREL BISULFATE 75 MG PO TABS
75.0000 mg | ORAL_TABLET | Freq: Once | ORAL | Status: AC
  Administered 2022-01-25: 75 mg via ORAL

## 2022-01-25 MED ORDER — NOREPINEPHRINE 4 MG/250ML-% IV SOLN
INTRAVENOUS | Status: DC | PRN
  Administered 2022-01-25: 1 ug/min via INTRAVENOUS

## 2022-01-25 MED ORDER — CLEVIDIPINE BUTYRATE 0.5 MG/ML IV EMUL
0.0000 mg/h | INTRAVENOUS | Status: AC
  Administered 2022-01-25 (×3): 6 mg/h via INTRAVENOUS
  Administered 2022-01-25: 10 mg/h via INTRAVENOUS
  Administered 2022-01-26 (×2): 6 mg/h via INTRAVENOUS
  Filled 2022-01-25 (×5): qty 50

## 2022-01-25 MED ORDER — FENTANYL CITRATE (PF) 100 MCG/2ML IJ SOLN: 25.0000 ug | INTRAMUSCULAR | Status: DC | PRN

## 2022-01-25 MED ORDER — ETOMIDATE 2 MG/ML IV SOLN
INTRAVENOUS | Status: DC | PRN
  Administered 2022-01-25: 12 mg via INTRAVENOUS

## 2022-01-25 MED ORDER — DEXAMETHASONE SODIUM PHOSPHATE 10 MG/ML IJ SOLN
INTRAMUSCULAR | Status: DC | PRN
  Administered 2022-01-25: 8 mg via INTRAVENOUS

## 2022-01-25 MED ORDER — FENTANYL CITRATE (PF) 100 MCG/2ML IJ SOLN
INTRAMUSCULAR | Status: AC
  Filled 2022-01-25: qty 2

## 2022-01-25 MED ORDER — EPTIFIBATIDE 20 MG/10ML IV SOLN
INTRAVENOUS | Status: AC
  Filled 2022-01-25: qty 10

## 2022-01-25 MED ORDER — NITROGLYCERIN 1 MG/10 ML FOR IR/CATH LAB
INTRA_ARTERIAL | Status: AC
  Filled 2022-01-25: qty 10

## 2022-01-25 MED ORDER — CLEVIDIPINE BUTYRATE 0.5 MG/ML IV EMUL
INTRAVENOUS | Status: DC | PRN
  Administered 2022-01-25: 4 mg/h via INTRAVENOUS

## 2022-01-25 MED ORDER — CLOPIDOGREL BISULFATE 75 MG PO TABS
ORAL_TABLET | ORAL | Status: AC
  Filled 2022-01-25: qty 1

## 2022-01-25 MED ORDER — SUGAMMADEX SODIUM 200 MG/2ML IV SOLN
INTRAVENOUS | Status: DC | PRN
  Administered 2022-01-25 (×2): 200 mg via INTRAVENOUS

## 2022-01-25 MED ORDER — NITROGLYCERIN 1 MG/10 ML FOR IR/CATH LAB
INTRA_ARTERIAL | Status: AC | PRN
  Administered 2022-01-25: 25 ug via INTRA_ARTERIAL
  Administered 2022-01-25: 200 ug via INTRA_ARTERIAL

## 2022-01-25 MED ORDER — HEPARIN (PORCINE) 25000 UT/250ML-% IV SOLN
750.0000 [IU]/h | INTRAVENOUS | Status: AC
  Filled 2022-01-25: qty 250

## 2022-01-25 MED ORDER — OXYCODONE HCL 5 MG/5ML PO SOLN: 5.0000 mg | Freq: Once | ORAL | Status: DC | PRN

## 2022-01-25 MED ORDER — ASPIRIN 81 MG PO CHEW: 81.0000 mg | CHEWABLE_TABLET | Freq: Every day | ORAL | Status: DC

## 2022-01-25 MED ORDER — ASPIRIN 81 MG PO TBEC: 81.0000 mg | DELAYED_RELEASE_TABLET | Freq: Every day | ORAL | Status: DC

## 2022-01-25 MED ORDER — LACTATED RINGERS IV SOLN: INTRAVENOUS | Status: DC | PRN

## 2022-01-25 MED ORDER — ASPIRIN 81 MG PO CHEW
81.0000 mg | CHEWABLE_TABLET | Freq: Every day | ORAL | Status: DC
  Administered 2022-01-26: 81 mg via ORAL
  Filled 2022-01-25: qty 1

## 2022-01-25 MED ORDER — ACETAMINOPHEN 325 MG PO TABS: 650.0000 mg | ORAL_TABLET | ORAL | Status: DC | PRN

## 2022-01-25 MED ORDER — HEPARIN SODIUM (PORCINE) 1000 UNIT/ML IJ SOLN
INTRAMUSCULAR | Status: AC
  Filled 2022-01-25: qty 10

## 2022-01-25 MED ORDER — SODIUM CHLORIDE 0.9 % IV SOLN: INTRAVENOUS | Status: DC

## 2022-01-25 MED ORDER — ROCURONIUM BROMIDE 10 MG/ML (PF) SYRINGE
PREFILLED_SYRINGE | INTRAVENOUS | Status: DC | PRN
  Administered 2022-01-25: 20 mg via INTRAVENOUS
  Administered 2022-01-25: 60 mg via INTRAVENOUS
  Administered 2022-01-25: 40 mg via INTRAVENOUS

## 2022-01-25 MED ORDER — ACETAMINOPHEN 325 MG PO TABS: 325.0000 mg | ORAL_TABLET | ORAL | Status: DC | PRN

## 2022-01-25 MED ORDER — CHLORHEXIDINE GLUCONATE CLOTH 2 % EX PADS
6.0000 | MEDICATED_PAD | Freq: Every day | CUTANEOUS | Status: DC
  Administered 2022-01-25: 6 via TOPICAL

## 2022-01-25 MED ORDER — PROMETHAZINE HCL 25 MG/ML IJ SOLN: 6.2500 mg | INTRAMUSCULAR | Status: DC | PRN

## 2022-01-25 MED ORDER — ORAL CARE MOUTH RINSE: 15.0000 mL | Freq: Once | OROMUCOSAL | Status: AC

## 2022-01-25 MED ORDER — EPTIFIBATIDE 20 MG/10ML IV SOLN
INTRAVENOUS | Status: AC | PRN
  Administered 2022-01-25 (×3): 1.5 mg via INTRA_ARTERIAL

## 2022-01-25 MED ORDER — HEPARIN SODIUM (PORCINE) 1000 UNIT/ML IJ SOLN
INTRAMUSCULAR | Status: DC | PRN
  Administered 2022-01-25: 1000 [IU] via INTRAVENOUS
  Administered 2022-01-25: 2000 [IU] via INTRAVENOUS

## 2022-01-25 MED ORDER — AMISULPRIDE (ANTIEMETIC) 5 MG/2ML IV SOLN: 10.0000 mg | Freq: Once | INTRAVENOUS | Status: DC | PRN

## 2022-01-25 MED ORDER — PROPOFOL 10 MG/ML IV BOLUS
INTRAVENOUS | Status: DC | PRN
  Administered 2022-01-25: 30 mg via INTRAVENOUS
  Administered 2022-01-25: 20 mg via INTRAVENOUS

## 2022-01-25 MED ORDER — LIDOCAINE HCL 1 % IJ SOLN
INTRAMUSCULAR | Status: AC
  Filled 2022-01-25: qty 20

## 2022-01-25 MED ORDER — CLEVIDIPINE BUTYRATE 0.5 MG/ML IV EMUL
INTRAVENOUS | Status: AC
  Filled 2022-01-25: qty 50

## 2022-01-25 MED ORDER — VERAPAMIL HCL 2.5 MG/ML IV SOLN
INTRAVENOUS | Status: AC
  Filled 2022-01-25: qty 2

## 2022-01-25 MED ORDER — CLOPIDOGREL BISULFATE 75 MG PO TABS: 75.0000 mg | ORAL_TABLET | Freq: Every day | ORAL | Status: DC

## 2022-01-25 MED ORDER — ACETAMINOPHEN 10 MG/ML IV SOLN
1000.0000 mg | Freq: Once | INTRAVENOUS | Status: DC | PRN
  Administered 2022-01-25: 1000 mg via INTRAVENOUS

## 2022-01-25 MED ORDER — IOHEXOL 300 MG/ML  SOLN: 150.0000 mL | Freq: Once | INTRAMUSCULAR | Status: DC | PRN

## 2022-01-25 MED ORDER — EPHEDRINE SULFATE (PRESSORS) 50 MG/ML IJ SOLN
INTRAMUSCULAR | Status: DC | PRN
  Administered 2022-01-25: 2.5 mg via INTRAVENOUS

## 2022-01-25 MED ORDER — PHENYLEPHRINE 80 MCG/ML (10ML) SYRINGE FOR IV PUSH (FOR BLOOD PRESSURE SUPPORT)
PREFILLED_SYRINGE | INTRAVENOUS | Status: DC | PRN
  Administered 2022-01-25 (×2): 160 ug via INTRAVENOUS

## 2022-01-25 MED ORDER — HEPARIN (PORCINE) 25000 UT/250ML-% IV SOLN
INTRAVENOUS | Status: AC
  Filled 2022-01-25: qty 250

## 2022-01-25 MED ORDER — ORAL CARE MOUTH RINSE: 15.0000 mL | OROMUCOSAL | Status: DC | PRN

## 2022-01-25 MED ORDER — CLOPIDOGREL BISULFATE 75 MG PO TABS: 75.0000 mg | ORAL_TABLET | Freq: Once | ORAL | Status: DC

## 2022-01-25 MED ORDER — HEPARIN (PORCINE) 25000 UT/250ML-% IV SOLN
700.0000 [IU]/h | INTRAVENOUS | Status: DC
  Administered 2022-01-25: 500 [IU]/h via INTRAVENOUS
  Filled 2022-01-25: qty 250

## 2022-01-25 MED ORDER — CLOPIDOGREL BISULFATE 75 MG PO TABS
75.0000 mg | ORAL_TABLET | Freq: Every day | ORAL | Status: DC
  Administered 2022-01-26: 75 mg via ORAL
  Filled 2022-01-25: qty 1

## 2022-01-25 MED ORDER — GLYCOPYRROLATE 0.2 MG/ML IJ SOLN
INTRAMUSCULAR | Status: DC | PRN
  Administered 2022-01-25 (×2): .2 mg via INTRAVENOUS

## 2022-01-25 MED ORDER — CHLORHEXIDINE GLUCONATE 0.12 % MT SOLN
15.0000 mL | Freq: Once | OROMUCOSAL | Status: AC
  Administered 2022-01-25: 15 mL via OROMUCOSAL
  Filled 2022-01-25: qty 15

## 2022-01-25 MED ORDER — ACETAMINOPHEN 10 MG/ML IV SOLN
INTRAVENOUS | Status: AC
  Filled 2022-01-25: qty 100

## 2022-01-25 MED ORDER — ALBUMIN HUMAN 5 % IV SOLN: INTRAVENOUS | Status: DC | PRN

## 2022-01-25 MED ORDER — OXYCODONE HCL 5 MG PO TABS: 5.0000 mg | ORAL_TABLET | Freq: Once | ORAL | Status: DC | PRN

## 2022-01-25 MED ORDER — ACETAMINOPHEN 160 MG/5ML PO SOLN: 650.0000 mg | ORAL | Status: DC | PRN

## 2022-01-25 MED ORDER — CEFAZOLIN SODIUM-DEXTROSE 2-4 GM/100ML-% IV SOLN
INTRAVENOUS | Status: AC
  Filled 2022-01-25: qty 100

## 2022-01-25 MED ORDER — FENTANYL CITRATE (PF) 250 MCG/5ML IJ SOLN
INTRAMUSCULAR | Status: DC | PRN
  Administered 2022-01-25: 100 ug via INTRAVENOUS

## 2022-01-25 MED ORDER — NIMODIPINE 30 MG PO CAPS
0.0000 mg | ORAL_CAPSULE | ORAL | Status: DC
  Filled 2022-01-25: qty 2

## 2022-01-25 MED ORDER — ACETAMINOPHEN 650 MG RE SUPP: 650.0000 mg | RECTAL | Status: DC | PRN

## 2022-01-25 MED ORDER — VASOPRESSIN 20 UNIT/ML IV SOLN
INTRAVENOUS | Status: DC | PRN
  Administered 2022-01-25: 1 [IU] via INTRAVENOUS
  Administered 2022-01-25 (×2): 2 [IU] via INTRAVENOUS

## 2022-01-25 MED ORDER — VERAPAMIL HCL 2.5 MG/ML IV SOLN
INTRA_ARTERIAL | Status: AC | PRN
  Administered 2022-01-25: 7 mL via INTRA_ARTERIAL

## 2022-01-25 MED ORDER — ACETAMINOPHEN 160 MG/5ML PO SOLN: 325.0000 mg | ORAL | Status: DC | PRN

## 2022-01-25 NOTE — Procedures (Signed)
INR. Status post right common carotid arteriogram.  Right radial approach.  Findings.  Severe preocclusive stenosis of the  petrous right ICA distally. Status post revascularization of the severely stenotic right internal carotid artery petrous segment, with balloon angioplasty followed by stenting. Improved hemodynamic flow noted in the right anterior cerebral artery territory, in the right middle cerebral artery distribution.  Scattered foci of moderate to moderate severe intracranial stenosis noted of the right anterior cerebral artery and MCA distributions unchanged. Right wristband applied at the right radial puncture site with hemostasis.  Distal right radial pulse verified to be present Post CT brain no evidence for intracranial hemorrhage.  Patient extubated. Fully awake alert.  Denies any headaches or nausea or vomiting.  Pupils 3 mm bilaterally equal sluggishly  reactive.  No facial asymmetry.  Tongue midline.  Moves all fours equally spontaneously and to command. Fatima Sanger MD.

## 2022-01-25 NOTE — H&P (Signed)
Chief Complaint: Patient was seen in consultation today for right ICA stenosis   Referring Physician(s): [name],[name]  Supervising Physician: Luanne Bras  Patient Status: [facility] - Out-pt  History of Present Illness: Benjamin Valentine is a 75 y.o. male previously admitted with CVA and found to have severe right ICA stenosis. He underwent angiogram and is now scheduled for stent angioplasty today. PMHx, meds, labs, imaging, allergies reviewed. Feels well, no recent fevers, chills, illness. Has been NPO today as directed. Has been taking his ASA and Plavix as directed. Family at bedside.   Past Medical History:  Diagnosis Date   Arthritis    CHF (congestive heart failure) (HCC)    Chronic HFrEF (heart failure with reduced ejection fraction) ([place])    a. 10/2020 Echo: EF 25-30%, nl RV fxn, mild MR.   CKD (chronic kidney disease), stage III ([place])    Coronary artery disease    a. 2002 s/p CABG x 2: LIMA->LAD, VG->RPDA; b. 11/2020 Cath: LM 20ost, LAD 126m, LCX 99p/m, 36m/d, OM1 30, RCA 100p - fills via collats from LAD. RPL1 fills via collats from LCX. VG->PDA 100, LIMA->LAD ok-->med rx.   Gout    Hyperlipidemia LDL goal <70    Hypertension    Ischemic cardiomyopathy    a. 10/2020 Echo: EF 25-30%.   Osteoarthritis     Past Surgical History:  Procedure Laterality Date   CARDIAC CATHETERIZATION     CARDIAC SURGERY     CABG 2002   IR ANGIO EXTRACRAN SEL COM CAROTID INNOMINATE UNI BILAT MOD SED  01/03/2022   IR ANGIO VERTEBRAL SEL VERTEBRAL UNI L MOD SED  01/03/2022   IR RADIOLOGIST EVAL & MGMT  12/21/2021   IR US GUIDE VASC ACCESS RIGHT  01/03/2022   LAPAROSCOPIC APPENDECTOMY N/A 08/11/2018   Procedure: APPENDECTOMY LAPAROSCOPIC;  Surgeon: Olean Ree, MD;  Location: [facility] ORS;  Service: General;  Laterality: N/A;   RIGHT/LEFT HEART CATH AND CORONARY/GRAFT ANGIOGRAPHY N/A 11/15/2020   Procedure: RIGHT/LEFT HEART CATH AND CORONARY/GRAFT ANGIOGRAPHY;  Surgeon: Wellington Hampshire, MD;  Location: [place] [facility];  Service: Cardiovascular;  Laterality: N/A;    Allergies: Entresto [sacubitril-valsartan]  Medications: Prior to Admission medications   Medication Sig Start Date End Date Taking? Authorizing Provider  allopurinol (ZYLOPRIM) 100 MG tablet Take 1 tablet (100 mg total) by mouth daily. 06/29/21   Cletis Athens, MD  aspirin EC 81 MG tablet Take 1 tablet (81 mg total) by mouth daily. Swallow whole. 11/25/21 02/23/22  Enzo Bi, MD  atorvastatin (LIPITOR) 40 MG tablet TAKE 1 TABLET BY MOUTH EVERY DAY Patient taking differently: Take 40 mg by mouth daily. 05/23/21   Cletis Athens, MD  Blood Pressure Monitoring [facility]) MISC 1 each by Does not apply route daily. 05/01/21   Theresia Lo, NP  carvedilol (COREG) 3.125 MG tablet TAKE 1 TABLET (3.125 MG TOTAL) BY MOUTH 2 (TWO) TIMES DAILY WITH A MEAL. 05/30/21   Cletis Athens, MD  clopidogrel (PLAVIX) 75 MG tablet Take 1 tablet (75 mg total) by mouth daily. 11/25/21 02/23/22  Enzo Bi, MD  colchicine 0.6 MG tablet Take 2 tabs p.o. x1 then take 1 tab p.o. 1 hour later for gout attack. Patient taking differently: Take 0.6 mg by mouth daily as needed (gout flare ups). Take 2 tabs p.o. x1 then take 1 tab p.o. 1 hour later for gout attack. 10/23/21   [name], Linden Dolin, [name]  dapagliflozin propanediol (FARXIGA) 10 MG TABS tablet Take 1 tablet (10 mg total)  by mouth daily. 11/02/21   Alisa Graff, [name]  furosemide (LASIX) 20 MG tablet Take 1 tablet (20 mg total) by mouth daily as needed (As needed for weight gain of 2 pounds overnight). 06/29/21   Cletis Athens, MD  hydrALAZINE (APRESOLINE) 50 MG tablet TAKE 1 TABLET BY MOUTH THREE TIMES A DAY 05/30/21   Cletis Athens, MD  isosorbide mononitrate (IMDUR) 30 MG 24 hr tablet Take 1 tablet (30 mg total) by mouth daily. 06/29/21   Cletis Athens, MD     No family history on file.  Social History   Socioeconomic History   Marital status: Widowed    Spouse  name: Not on file   Number of children: Not on file   Years of education: Not on file   Highest education level: Not on file  Occupational History   Not on file  Tobacco Use   Smoking status: Former   Smokeless tobacco: Never  Vaping Use   Vaping Use: Never used  Substance and Sexual Activity   Alcohol use: No   Drug use: No   Sexual activity: Not on file  Other Topics Concern   Not on file  Social History Narrative   Not on file   Social Determinants of Health   Financial Resource Strain: Low Risk  (11/25/2020)   Overall Financial Resource Strain (CARDIA)    Difficulty of Paying Living Expenses: Not very hard  Food Insecurity: No Food Insecurity (11/25/2020)   Hunger Vital Sign    Worried About Running Out of Food in the Last Year: Never true    Ran Out of Food in the Last Year: Never true  Transportation Needs: No Transportation Needs (11/25/2020)   PRAPARE - Hydrologist (Medical): No    Lack of Transportation (Non-Medical): No  Physical Activity: Insufficiently Active (11/25/2020)   Exercise Vital Sign    Days of Exercise per Week: 2 days    Minutes of Exercise per Session: 20 min  Stress: Stress Concern Present (11/25/2020)   [place]    Feeling of Stress : To some extent  Social Connections: Socially Isolated (11/25/2020)   Social Connection and Isolation Panel [NHANES]    Frequency of Communication with Friends and Family: More than three times a week    Frequency of Social Gatherings with Friends and Family: More than three times a week    Attends Religious Services: Never    Marine scientist or Organizations: No    Attends Archivist Meetings: Never    Marital Status: Widowed    Review of Systems: A 12 point ROS discussed and pertinent positives are indicated in the HPI above.  All other systems are negative.  Review of Systems  Constitutional:  Negative.   Respiratory: Negative.    Cardiovascular: Negative.   Gastrointestinal:  Negative for abdominal pain, diarrhea and nausea.  Neurological:  Negative for facial asymmetry, speech difficulty, weakness and light-headedness.    Vital Signs: Temp: 97.8 HR: 69 BP: 145/87 RR: 18  Physical Exam Constitutional:      Appearance: He is not ill-appearing.  HENT:     Mouth/Throat:     Mouth: Mucous membranes are moist.     Pharynx: Oropharynx is clear.  Cardiovascular:     Rate and Rhythm: Normal rate and regular rhythm.     Pulses: Normal pulses.     Heart sounds: Normal heart sounds.  Pulmonary:  Effort: Pulmonary effort is normal. No respiratory distress.     Breath sounds: Normal breath sounds.  Neurological:     General: No focal deficit present.     Mental Status: He is alert.     Cranial Nerves: No cranial nerve deficit.  Psychiatric:        Mood and Affect: Mood normal.        Thought Content: Thought content normal.     Imaging: IR ANGIO VERTEBRAL SEL VERTEBRAL UNI L MOD SED  Result Date: 01/04/2022 CLINICAL DATA:  Left-sided stroke-like symptoms. High-grade stenosis of the right internal carotid artery at the cranial skull base on CT angiogram of the head and neck. EXAM: IR ANGIO EXTRACRAN SELECT COM CAROTID INNOMINATE BILAT MOD SED COMPARISON:  CT angiogram of the head and neck November 24, 2021. MEDICATIONS: Heparin 1000 units IV. The antibiotic was administered within 1 hour of the procedure. ANESTHESIA/SEDATION: Versed 1 mg IV; Fentanyl 25 mcg IV Moderate Sedation Time:  35 minutes The patient was continuously monitored during the procedure by the interventional radiology nurse under my direct supervision. CONTRAST:  Omnipaque 300 approximately 60 mL. FLUOROSCOPY TIME:  Fluoroscopy Time: 7 minutes 36 seconds (629 mGy). COMPLICATIONS: None immediate. TECHNIQUE: Informed written consent was obtained from the patient after a thorough discussion of the procedural  risks, benefits and alternatives. All questions were addressed. Maximal Sterile Barrier Technique was utilized including caps, mask, sterile gowns, sterile gloves, sterile drape, hand hygiene and skin antiseptic. A timeout was performed prior to the initiation of the procedure. The right forearm to the wrist was prepped and draped in the usual sterile manner. The right radial artery was then identified with ultrasound, and its morphology documented in the radiology PACS system. A dorsal palmar anastomosis was verified to be present. Using ultrasound guidance, access into the right radial artery was obtained with a micropuncture set over an 018 inch micro guidewire. A 4/5 French radial sheath was then inserted. The micro guidewire, and the obturator were removed. Good aspiration was obtained from the side port of the radial sheath. A cocktail of 2000 units of heparin, and 2.5 mg of verapamil was then infused in diluted form without event. A right radial arteriogram was then performed. Over an 035 inch Roadrunner guidewire, a 5 French Simmons 2 diagnostic catheter was then advanced to the aortic arch region, and selectively positioned in the right common carotid artery, the left common carotid artery, the right subclavian artery and the left vertebral artery. A wrist band was then applied for hemostasis at the right radial puncture site. Distal right radial pulse was verified to be present. FINDINGS: The right subclavian arteriogram demonstrates a hypoplastic right vertebral artery which is seen to opacify to the cranial skull base on the images obtained. The right common carotid arteriogram demonstrates the right external carotid artery and its major branches to be widely patent. The right internal carotid artery just distal to the bulb has a mild narrowing. The vessel then ascends to the cranial skull base tortuous course. The petrous segment demonstrates a severe focal stenosis in the distal horizontal segment.  This is associated with mild post stenotic dilatation. Distal to this the cavernous and the supraclinoid right ICA demonstrate wide patency. Mild atherosclerotic irregularity is evident in the cavernous segment. A right posterior communicating artery is seen opacifying the right posterior cerebral artery distribution. The right middle cerebral artery demonstrates mild focal areas of irregularity in its M1 segment. Distal to this the branches opacify with associated  areas of mild caliber irregularity. Hypoplastic right anterior cerebral A1 segment is noted. The left common carotid arteriogram demonstrates mild atherosclerotic narrowing in its mid segment. Distal to this the left external carotid artery and its major branches are widely patent. The left internal carotid artery at the bulb has a small ulceration associated with a partially calcified plaque. No evidence of significant narrowing is noted by the NASCET criteria. Mild tortuosity is noted in the proximal left internal carotid artery. More distally, the vessel is seen to ascend normally to the cranial skull base. Mild caliber irregularity without significant stenosis noted of the distal petrous segment. The cavernous segment and the supraclinoid ICA demonstrated patency with mild atherosclerotic irregularity in the cavernous segment. The left middle cerebral artery has a 50% stenosis in the proximal M1 segment. More distally, the trifurcation branches opacify into the capillary and venous phases with focal areas of caliber irregularity. Left anterior cerebral artery has mild-to-moderate stenosis. Distal branches demonstrate opacification with suggestion of cross-filling via the anterior communicating artery of the right anterior cerebral artery A2 segment and distally. The left vertebral artery origin is patent with moderate tortuosity just distal to this. The vessel has mild stenosis in its proximal 1/3. Distal to this the vessel is seen to opacify to the  cranial skull base. There is approximately 30% to 40% stenosis just distal to the left posterior-inferior cerebellar artery. Distal to this there is another area of approximately 50% stenosis proximal to the basilar artery. The basilar artery is widely patent with mild caliber irregularity with mild stenosis in its distal 1/3. The left posterior cerebral artery, the superior cerebellar arteries and the anterior-inferior cerebellar arteries demonstrate wide patency and grossly are intact. IMPRESSION: Severe pre occlusive stenosis of the distal horizontal petrous segment of the right internal carotid artery. Approximately 50% stenosis of the proximal M1 segment of the left middle cerebral artery. Approximately 30% stenosis of the left vertebrobasilar junction just distal to the origin of the left posterior-inferior cerebellar artery, and of a 50% stenosis just proximal to the origin of the basilar artery. Diffuse intracranial arteriosclerotic changes involving the anterior and the middle cerebral arteries bilaterally and of the basilar artery, and the left posterior-inferior cerebellar artery. PLAN: Findings reviewed with the patient's daughter-in-law, and the patient. Option of endovascular revascularization of pre occlusive stenosis of the right internal carotid artery petrous segment was reviewed. The procedure, benefits, alternatives were also discussed. The patient has expressed his desire to proceed with procedure via trans radial or transfemoral route under general anesthesia which will be scheduled as soon as possible. Daughter-in-law and patient were asked to call should they have any concerns or questions. Electronically Signed   By: Luanne Bras M.D.   On: 01/04/2022 08:17   IR ANGIO EXTRACRAN SEL COM CAROTID INNOMINATE UNI BILAT MOD SED  Result Date: 01/04/2022 CLINICAL DATA:  Left-sided stroke-like symptoms. High-grade stenosis of the right internal carotid artery at the cranial skull base on  CT angiogram of the head and neck. EXAM: IR ANGIO EXTRACRAN SELECT COM CAROTID INNOMINATE BILAT MOD SED COMPARISON:  CT angiogram of the head and neck November 24, 2021. MEDICATIONS: Heparin 1000 units IV. The antibiotic was administered within 1 hour of the procedure. ANESTHESIA/SEDATION: Versed 1 mg IV; Fentanyl 25 mcg IV Moderate Sedation Time:  35 minutes The patient was continuously monitored during the procedure by the interventional radiology nurse under my direct supervision. CONTRAST:  Omnipaque 300 approximately 60 mL. FLUOROSCOPY TIME:  Fluoroscopy Time: 7 minutes  36 seconds (629 mGy). COMPLICATIONS: None immediate. TECHNIQUE: Informed written consent was obtained from the patient after a thorough discussion of the procedural risks, benefits and alternatives. All questions were addressed. Maximal Sterile Barrier Technique was utilized including caps, mask, sterile gowns, sterile gloves, sterile drape, hand hygiene and skin antiseptic. A timeout was performed prior to the initiation of the procedure. The right forearm to the wrist was prepped and draped in the usual sterile manner. The right radial artery was then identified with ultrasound, and its morphology documented in the radiology PACS system. A dorsal palmar anastomosis was verified to be present. Using ultrasound guidance, access into the right radial artery was obtained with a micropuncture set over an 018 inch micro guidewire. A 4/5 French radial sheath was then inserted. The micro guidewire, and the obturator were removed. Good aspiration was obtained from the side port of the radial sheath. A cocktail of 2000 units of heparin, and 2.5 mg of verapamil was then infused in diluted form without event. A right radial arteriogram was then performed. Over an 035 inch Roadrunner guidewire, a 5 French Simmons 2 diagnostic catheter was then advanced to the aortic arch region, and selectively positioned in the right common carotid artery, the left common  carotid artery, the right subclavian artery and the left vertebral artery. A wrist band was then applied for hemostasis at the right radial puncture site. Distal right radial pulse was verified to be present. FINDINGS: The right subclavian arteriogram demonstrates a hypoplastic right vertebral artery which is seen to opacify to the cranial skull base on the images obtained. The right common carotid arteriogram demonstrates the right external carotid artery and its major branches to be widely patent. The right internal carotid artery just distal to the bulb has a mild narrowing. The vessel then ascends to the cranial skull base tortuous course. The petrous segment demonstrates a severe focal stenosis in the distal horizontal segment. This is associated with mild post stenotic dilatation. Distal to this the cavernous and the supraclinoid right ICA demonstrate wide patency. Mild atherosclerotic irregularity is evident in the cavernous segment. A right posterior communicating artery is seen opacifying the right posterior cerebral artery distribution. The right middle cerebral artery demonstrates mild focal areas of irregularity in its M1 segment. Distal to this the branches opacify with associated areas of mild caliber irregularity. Hypoplastic right anterior cerebral A1 segment is noted. The left common carotid arteriogram demonstrates mild atherosclerotic narrowing in its mid segment. Distal to this the left external carotid artery and its major branches are widely patent. The left internal carotid artery at the bulb has a small ulceration associated with a partially calcified plaque. No evidence of significant narrowing is noted by the NASCET criteria. Mild tortuosity is noted in the proximal left internal carotid artery. More distally, the vessel is seen to ascend normally to the cranial skull base. Mild caliber irregularity without significant stenosis noted of the distal petrous segment. The cavernous segment and  the supraclinoid ICA demonstrated patency with mild atherosclerotic irregularity in the cavernous segment. The left middle cerebral artery has a 50% stenosis in the proximal M1 segment. More distally, the trifurcation branches opacify into the capillary and venous phases with focal areas of caliber irregularity. Left anterior cerebral artery has mild-to-moderate stenosis. Distal branches demonstrate opacification with suggestion of cross-filling via the anterior communicating artery of the right anterior cerebral artery A2 segment and distally. The left vertebral artery origin is patent with moderate tortuosity just distal to this. The vessel  has mild stenosis in its proximal 1/3. Distal to this the vessel is seen to opacify to the cranial skull base. There is approximately 30% to 40% stenosis just distal to the left posterior-inferior cerebellar artery. Distal to this there is another area of approximately 50% stenosis proximal to the basilar artery. The basilar artery is widely patent with mild caliber irregularity with mild stenosis in its distal 1/3. The left posterior cerebral artery, the superior cerebellar arteries and the anterior-inferior cerebellar arteries demonstrate wide patency and grossly are intact. IMPRESSION: Severe pre occlusive stenosis of the distal horizontal petrous segment of the right internal carotid artery. Approximately 50% stenosis of the proximal M1 segment of the left middle cerebral artery. Approximately 30% stenosis of the left vertebrobasilar junction just distal to the origin of the left posterior-inferior cerebellar artery, and of a 50% stenosis just proximal to the origin of the basilar artery. Diffuse intracranial arteriosclerotic changes involving the anterior and the middle cerebral arteries bilaterally and of the basilar artery, and the left posterior-inferior cerebellar artery. PLAN: Findings reviewed with the patient's daughter-in-law, and the patient. Option of  endovascular revascularization of pre occlusive stenosis of the right internal carotid artery petrous segment was reviewed. The procedure, benefits, alternatives were also discussed. The patient has expressed his desire to proceed with procedure via trans radial or transfemoral route under general anesthesia which will be scheduled as soon as possible. Daughter-in-law and patient were asked to call should they have any concerns or questions. Electronically Signed   By: Luanne Bras M.D.   On: 01/04/2022 08:17   IR US Guide Vasc Access Right  Result Date: 01/04/2022 CLINICAL DATA:  Left-sided stroke-like symptoms. High-grade stenosis of the right internal carotid artery at the cranial skull base on CT angiogram of the head and neck. EXAM: IR ANGIO EXTRACRAN SELECT COM CAROTID INNOMINATE BILAT MOD SED COMPARISON:  CT angiogram of the head and neck November 24, 2021. MEDICATIONS: Heparin 1000 units IV. The antibiotic was administered within 1 hour of the procedure. ANESTHESIA/SEDATION: Versed 1 mg IV; Fentanyl 25 mcg IV Moderate Sedation Time:  35 minutes The patient was continuously monitored during the procedure by the interventional radiology nurse under my direct supervision. CONTRAST:  Omnipaque 300 approximately 60 mL. FLUOROSCOPY TIME:  Fluoroscopy Time: 7 minutes 36 seconds (629 mGy). COMPLICATIONS: None immediate. TECHNIQUE: Informed written consent was obtained from the patient after a thorough discussion of the procedural risks, benefits and alternatives. All questions were addressed. Maximal Sterile Barrier Technique was utilized including caps, mask, sterile gowns, sterile gloves, sterile drape, hand hygiene and skin antiseptic. A timeout was performed prior to the initiation of the procedure. The right forearm to the wrist was prepped and draped in the usual sterile manner. The right radial artery was then identified with ultrasound, and its morphology documented in the radiology PACS system. A  dorsal palmar anastomosis was verified to be present. Using ultrasound guidance, access into the right radial artery was obtained with a micropuncture set over an 018 inch micro guidewire. A 4/5 French radial sheath was then inserted. The micro guidewire, and the obturator were removed. Good aspiration was obtained from the side port of the radial sheath. A cocktail of 2000 units of heparin, and 2.5 mg of verapamil was then infused in diluted form without event. A right radial arteriogram was then performed. Over an 035 inch Roadrunner guidewire, a 5 French Simmons 2 diagnostic catheter was then advanced to the aortic arch region, and selectively positioned in the right  common carotid artery, the left common carotid artery, the right subclavian artery and the left vertebral artery. A wrist band was then applied for hemostasis at the right radial puncture site. Distal right radial pulse was verified to be present. FINDINGS: The right subclavian arteriogram demonstrates a hypoplastic right vertebral artery which is seen to opacify to the cranial skull base on the images obtained. The right common carotid arteriogram demonstrates the right external carotid artery and its major branches to be widely patent. The right internal carotid artery just distal to the bulb has a mild narrowing. The vessel then ascends to the cranial skull base tortuous course. The petrous segment demonstrates a severe focal stenosis in the distal horizontal segment. This is associated with mild post stenotic dilatation. Distal to this the cavernous and the supraclinoid right ICA demonstrate wide patency. Mild atherosclerotic irregularity is evident in the cavernous segment. A right posterior communicating artery is seen opacifying the right posterior cerebral artery distribution. The right middle cerebral artery demonstrates mild focal areas of irregularity in its M1 segment. Distal to this the branches opacify with associated areas of mild  caliber irregularity. Hypoplastic right anterior cerebral A1 segment is noted. The left common carotid arteriogram demonstrates mild atherosclerotic narrowing in its mid segment. Distal to this the left external carotid artery and its major branches are widely patent. The left internal carotid artery at the bulb has a small ulceration associated with a partially calcified plaque. No evidence of significant narrowing is noted by the NASCET criteria. Mild tortuosity is noted in the proximal left internal carotid artery. More distally, the vessel is seen to ascend normally to the cranial skull base. Mild caliber irregularity without significant stenosis noted of the distal petrous segment. The cavernous segment and the supraclinoid ICA demonstrated patency with mild atherosclerotic irregularity in the cavernous segment. The left middle cerebral artery has a 50% stenosis in the proximal M1 segment. More distally, the trifurcation branches opacify into the capillary and venous phases with focal areas of caliber irregularity. Left anterior cerebral artery has mild-to-moderate stenosis. Distal branches demonstrate opacification with suggestion of cross-filling via the anterior communicating artery of the right anterior cerebral artery A2 segment and distally. The left vertebral artery origin is patent with moderate tortuosity just distal to this. The vessel has mild stenosis in its proximal 1/3. Distal to this the vessel is seen to opacify to the cranial skull base. There is approximately 30% to 40% stenosis just distal to the left posterior-inferior cerebellar artery. Distal to this there is another area of approximately 50% stenosis proximal to the basilar artery. The basilar artery is widely patent with mild caliber irregularity with mild stenosis in its distal 1/3. The left posterior cerebral artery, the superior cerebellar arteries and the anterior-inferior cerebellar arteries demonstrate wide patency and grossly are  intact. IMPRESSION: Severe pre occlusive stenosis of the distal horizontal petrous segment of the right internal carotid artery. Approximately 50% stenosis of the proximal M1 segment of the left middle cerebral artery. Approximately 30% stenosis of the left vertebrobasilar junction just distal to the origin of the left posterior-inferior cerebellar artery, and of a 50% stenosis just proximal to the origin of the basilar artery. Diffuse intracranial arteriosclerotic changes involving the anterior and the middle cerebral arteries bilaterally and of the basilar artery, and the left posterior-inferior cerebellar artery. PLAN: Findings reviewed with the patient's daughter-in-law, and the patient. Option of endovascular revascularization of pre occlusive stenosis of the right internal carotid artery petrous segment was reviewed. The  procedure, benefits, alternatives were also discussed. The patient has expressed his desire to proceed with procedure via trans radial or transfemoral route under general anesthesia which will be scheduled as soon as possible. Daughter-in-law and patient were asked to call should they have any concerns or questions. Electronically Signed   By: Luanne Bras M.D.   On: 01/04/2022 08:17    Labs:  CBC: Recent Labs    11/24/21 1254 11/25/21 0745 01/03/22 0844 01/25/22 0734  WBC 9.1 7.5 6.7 6.7  HGB 12.1* 11.7* 12.4* 11.2*  HCT 39.5 38.1* 41.5 36.4*  PLT 373 342 379 380    COAGS: Recent Labs    11/24/21 1254 01/03/22 0844 01/25/22 0734  INR 1.0 1.0 1.0  APTT 44*  --   --     BMP: Recent Labs    11/24/21 1254 11/25/21 0745 01/03/22 0844 01/25/22 0734  NA 139 141 139 138  K 4.4 3.7 4.3 4.4  CL 108 109 107 108  CO2 26 24 23 24   GLUCOSE 117* 102* 110* 108*  BUN 22 20 19 18   CALCIUM 9.0 8.9 9.4 8.8*  CREATININE 1.27* 1.25* 1.40* 1.48*  GFRNONAA 58* 59* 52* 48*    LIVER FUNCTION TESTS: Recent Labs    06/01/21 1001 11/24/21 1254  BILITOT 0.6 0.5  AST  18 13*  ALT 11 12  ALKPHOS 56 46  PROT 7.2 7.8  ALBUMIN 3.5 3.9     Assessment and Plan: Severe right ICA stenosis. Plan for angiogram with intent for stent angioplasty. P2Y12 acceptable at 109, will give additional Plavix 75 mg x 1 preop. Risks and benefits of the procedure were discussed with the patient including, but not limited to bleeding, infection, vascular injury or contrast induced renal failure.  This interventional procedure involves the use of X-rays and because of the nature of the planned procedure, it is possible that we will have prolonged use of X-ray fluoroscopy.  Potential radiation risks to you include (but are not limited to) the following: - A slightly elevated risk for cancer  several years later in life. This risk is typically less than 0.5% percent. This risk is low in comparison to the normal incidence of human cancer, which is 33% for women and 50% for men according to the [employer]. - Radiation induced injury can include skin redness, resembling a rash, tissue breakdown / ulcers and hair loss (which can be temporary or permanent).   The likelihood of either of these occurring depends on the difficulty of the procedure and whether you are sensitive to radiation due to previous procedures, disease, or genetic conditions.   IF your procedure requires a prolonged use of radiation, you will be notified and given written instructions for further action.  It is your responsibility to monitor the irradiated area for the 2 weeks following the procedure and to notify your physician if you are concerned that you have suffered a radiation induced injury.    All of the patient's questions were answered, patient is agreeable to proceed.  Consent signed and in chart.    Electronically Signed: Ascencion Dike, [name] 01/25/2022, 8:31 AM   I spent a total of 20 in face to face in clinical consultation, greater than 50% of which was counseling/coordinating  care for cerebral angio with intervention

## 2022-01-25 NOTE — Progress Notes (Signed)
Seen in PACU S/p successful stent angioplasty of distal petrous (R)ICA  Pt awake , alert NAD  PERRLA, EOMI Speech clear Face symmetric Fine motor intact Strength 5/5 (R)radial access site soft, T-band fully deflated at this point Hand warm  Cont hep gtt overnight Anticipate uncomplicated discharge tomorrow.  Brayton El [name] Interventional Radiology 01/25/2022 3:01 PM

## 2022-01-25 NOTE — Anesthesia Postprocedure Evaluation (Signed)
Anesthesia Post Note  Patient: Benjamin Valentine  Procedure(s) Performed: Cerebral angioplasty with possible stenting     Patient location during evaluation: PACU Anesthesia Type: General Level of consciousness: awake and alert Pain management: pain level controlled Vital Signs Assessment: post-procedure vital signs reviewed and stable Respiratory status: spontaneous breathing, nonlabored ventilation, respiratory function stable and patient connected to nasal cannula oxygen Cardiovascular status: blood pressure returned to baseline and stable Postop Assessment: no apparent nausea or vomiting Anesthetic complications: no   No notable events documented.  Last Vitals:  Vitals:   01/25/22 1545 01/25/22 1600  BP: (!) 137/90 (!) 146/83  Pulse: (!) 53 77  Resp: 16 16  Temp:    SpO2: 94% 95%    Last Pain:  Vitals:   01/25/22 1445  TempSrc:   PainSc: 0-No pain                 Shelton Silvas

## 2022-01-25 NOTE — Anesthesia Procedure Notes (Signed)
Arterial Line Insertion Start/End12/13/2023 8:40 AM, 01/25/2022 8:45 AM Performed by: Epifanio Lesches, CRNA, CRNA  Patient location: Pre-op. Preanesthetic checklist: patient identified, IV checked, site marked, risks and benefits discussed, surgical consent, monitors and equipment checked, pre-op evaluation, timeout performed and anesthesia consent Lidocaine 1% used for infiltration Left, radial was placed Catheter size: 20 G Hand hygiene performed  and maximum sterile barriers used  Allen's test indicative of satisfactory collateral circulation Attempts: 1 Procedure performed without using ultrasound guided technique. Following insertion, Biopatch and dressing applied. Post procedure assessment: normal  Patient tolerated the procedure well with no immediate complications.

## 2022-01-25 NOTE — Progress Notes (Addendum)
ANTICOAGULATION CONSULT NOTE - Initial Consult  Pharmacy Consult for heparin Indication:  s/p R common carotid arteriogram  Allergies  Allergen Reactions   Entresto [Sacubitril-Valsartan]     Made Pt feel unwell     Patient Measurements: Height: 5\' 9"  (175.3 cm) Weight: 90.7 kg (200 lb) IBW/kg (Calculated) : 70.7 Heparin Dosing Weight: 89kg  Vital Signs: Temp: 98.6 F (37 C) (12/13 1135) Temp Source: Oral (12/13 0727) BP: 114/63 (12/13 1300) Pulse Rate: 67 (12/13 1300)  Labs: Recent Labs    01/25/22 0734 01/25/22 1108  HGB 11.2*  --   HCT 36.4*  --   PLT 380  --   APTT  --  167*  LABPROT 13.2  --   INR 1.0  --   CREATININE 1.48*  --     Estimated Creatinine Clearance: 46.5 mL/min (A) (by C-G formula based on SCr of 1.48 mg/dL (H)).   Medical History: Past Medical History:  Diagnosis Date   Arthritis    CHF (congestive heart failure) (HCC)    Chronic HFrEF (heart failure with reduced ejection fraction) (HCC)    a. 10/2020 Echo: EF 25-30%, nl RV fxn, mild MR.   CKD (chronic kidney disease), stage III (HCC)    Coronary artery disease    a. 2002 s/p CABG x 2: LIMA->LAD, VG->RPDA; b. 11/2020 Cath: LM 20ost, LAD 159m, LCX 99p/m, 60m/d, OM1 30, RCA 100p - fills via collats from LAD. RPL1 fills via collats from LCX. VG->PDA 100, LIMA->LAD ok-->med rx.   Gout    Hyperlipidemia LDL goal <70    Hypertension    Ischemic cardiomyopathy    a. 10/2020 Echo: EF 25-30%.   Osteoarthritis     Medications:  Scheduled:   niMODipine  0-60 mg Oral 60 min Pre-Op    Assessment: 76 yo male s/p R common carotid arteriogram. No anticoag noted PTA. Pharmacy has been consulted to dose heparin post-op with no bolus.  Goal of Therapy:  Heparin level 0.1-0.25 units/ml per neuro IR Monitor platelets by anticoagulation protocol: Yes   Plan:  Begin heparin drip at 700 units/hr (~8 units/kg/hr) Check heparin level in 8 hours D/c heparin drip at 0800 on 12/14 per IR for sheath  removal  1/15, PharmD, BCPS 01/25/2022 1:07 PM

## 2022-01-25 NOTE — Progress Notes (Signed)
ANTICOAGULATION CONSULT NOTE   Pharmacy Consult for heparin Indication:  s/p R common carotid arteriogram  Allergies  Allergen Reactions   Entresto [Sacubitril-Valsartan]     Made Pt feel unwell     Patient Measurements: Height: 5\' 9"  (175.3 cm) Weight: 96.4 kg (212 lb 8.4 oz) IBW/kg (Calculated) : 70.7 Heparin Dosing Weight: 89kg  Vital Signs: Temp: 98.2 F (36.8 C) (12/13 2000) Temp Source: Oral (12/13 2000) BP: 115/63 (12/13 2100) Pulse Rate: 73 (12/13 2100)  Labs: Recent Labs    01/25/22 0734 01/25/22 1108 01/25/22 1736  HGB 11.2*  --   --   HCT 36.4*  --   --   PLT 380  --   --   APTT  --  167*  --   LABPROT 13.2  --   --   INR 1.0  --   --   HEPARINUNFRC  --   --  <0.10*  CREATININE 1.48*  --   --     Estimated Creatinine Clearance: 47.9 mL/min (A) (by C-G formula based on SCr of 1.48 mg/dL (H)).   Medical History: Past Medical History:  Diagnosis Date   Arthritis    CHF (congestive heart failure) (HCC)    Chronic HFrEF (heart failure with reduced ejection fraction) (HCC)    a. 10/2020 Echo: EF 25-30%, nl RV fxn, mild MR.   CKD (chronic kidney disease), stage III (HCC)    Coronary artery disease    a. 2002 s/p CABG x 2: LIMA->LAD, VG->RPDA; b. 11/2020 Cath: LM 20ost, LAD 152m, LCX 99p/m, 63m/d, OM1 30, RCA 100p - fills via collats from LAD. RPL1 fills via collats from LCX. VG->PDA 100, LIMA->LAD ok-->med rx.   Gout    Hyperlipidemia LDL goal <70    Hypertension    Ischemic cardiomyopathy    a. 10/2020 Echo: EF 25-30%.   Osteoarthritis     Medications:  Scheduled:   [START ON 01/26/2022] aspirin  81 mg Oral Daily   Or   [START ON 01/26/2022] aspirin  81 mg Per Tube Daily   Chlorhexidine Gluconate Cloth  6 each Topical Daily   [START ON 01/26/2022] clopidogrel  75 mg Oral Daily   Or   [START ON 01/26/2022] clopidogrel  75 mg Per Tube Daily    Assessment: 76 yo male s/p R common carotid arteriogram. No anticoag noted PTA. Pharmacy has been  consulted to dose heparin post-op with no bolus.  Goal of Therapy:  Heparin level 0.1-0.25 units/ml per neuro IR Monitor platelets by anticoagulation protocol: Yes   Plan:  Increase heparin drip at 750 units/hr (~8 units/kg/hr) D/c heparin drip at 0800 on 12/14 per IR for sheath removal  1/15, PharmD, BCPS, BCCCP Clinical Pharmacist Please refer to [facility] for [facility] Pharmacy numbers 01/25/2022 10:55 PM

## 2022-01-25 NOTE — Sedation Documentation (Signed)
ACT 174

## 2022-01-25 NOTE — Anesthesia Procedure Notes (Signed)
Procedure Name: Intubation Date/Time: 01/25/2022 9:18 AM  Performed by: Inda Coke, CRNAPre-anesthesia Checklist: Patient identified, Emergency Drugs available, Suction available, Timeout performed and Patient being monitored Patient Re-evaluated:Patient Re-evaluated prior to induction Oxygen Delivery Method: Circle system utilized Preoxygenation: Pre-oxygenation with 100% oxygen Induction Type: IV induction Ventilation: Mask ventilation without difficulty Laryngoscope Size: Mac and 4 Grade View: Grade I Tube type: Oral Tube size: 7.5 mm Number of attempts: 1 Airway Equipment and Method: Stylet Placement Confirmation: ETT inserted through vocal cords under direct vision, positive ETCO2, CO2 detector and breath sounds checked- equal and bilateral Secured at: 23 cm Tube secured with: Tape Dental Injury: Teeth and Oropharynx as per pre-operative assessment

## 2022-01-25 NOTE — Transfer of Care (Signed)
Immediate Anesthesia Transfer of Care Note  Patient: Benjamin Valentine  Procedure(s) Performed: Cerebral angioplasty with possible stenting  Patient Location: PACU  Anesthesia Type:General  Level of Consciousness: awake and alert   Airway & Oxygen Therapy: Patient Spontanous Breathing and Patient connected to face mask oxygen  Post-op Assessment: Report given to RN, Post -op Vital signs reviewed and stable, Patient moving all extremities, Patient moving all extremities X 4, and Patient able to stick tongue midline  Post vital signs: Reviewed and stable  Last Vitals:  Vitals Value Taken Time  BP    Temp    Pulse    Resp    SpO2      Last Pain:  Vitals:   01/25/22 0727  TempSrc: Oral  PainSc:          Complications: No notable events documented.

## 2022-01-25 NOTE — Sedation Documentation (Addendum)
ACT 179 

## 2022-01-26 ENCOUNTER — Encounter (HOSPITAL_COMMUNITY): Payer: Self-pay | Admitting: Interventional Radiology

## 2022-01-26 LAB — CBC WITH DIFFERENTIAL/PLATELET
Abs Immature Granulocytes: 0.1 10*3/uL — ABNORMAL HIGH (ref 0.00–0.07)
Basophils Absolute: 0 10*3/uL (ref 0.0–0.1)
Basophils Relative: 0 %
Eosinophils Absolute: 0 10*3/uL (ref 0.0–0.5)
Eosinophils Relative: 0 %
HCT: 34.3 % — ABNORMAL LOW (ref 39.0–52.0)
Hemoglobin: 10.6 g/dL — ABNORMAL LOW (ref 13.0–17.0)
Immature Granulocytes: 1 %
Lymphocytes Relative: 13 %
Lymphs Abs: 2 10*3/uL (ref 0.7–4.0)
MCH: 24.7 pg — ABNORMAL LOW (ref 26.0–34.0)
MCHC: 30.9 g/dL (ref 30.0–36.0)
MCV: 79.8 fL — ABNORMAL LOW (ref 80.0–100.0)
Monocytes Absolute: 1.1 10*3/uL — ABNORMAL HIGH (ref 0.1–1.0)
Monocytes Relative: 7 %
Neutro Abs: 12.1 10*3/uL — ABNORMAL HIGH (ref 1.7–7.7)
Neutrophils Relative %: 79 %
Platelets: 417 10*3/uL — ABNORMAL HIGH (ref 150–400)
RBC: 4.3 MIL/uL (ref 4.22–5.81)
RDW: 16.7 % — ABNORMAL HIGH (ref 11.5–15.5)
WBC: 15.3 10*3/uL — ABNORMAL HIGH (ref 4.0–10.5)
nRBC: 0 % (ref 0.0–0.2)

## 2022-01-26 LAB — BASIC METABOLIC PANEL
Anion gap: 11 (ref 5–15)
BUN: 23 mg/dL (ref 8–23)
CO2: 20 mmol/L — ABNORMAL LOW (ref 22–32)
Calcium: 8.9 mg/dL (ref 8.9–10.3)
Chloride: 106 mmol/L (ref 98–111)
Creatinine, Ser: 1.53 mg/dL — ABNORMAL HIGH (ref 0.61–1.24)
GFR, Estimated: 47 mL/min — ABNORMAL LOW (ref >60)
Glucose, Bld: 141 mg/dL — ABNORMAL HIGH (ref 70–99)
Potassium: 4.2 mmol/L (ref 3.5–5.1)
Sodium: 137 mmol/L (ref 135–145)

## 2022-01-26 LAB — MRSA NEXT GEN BY PCR, NASAL: MRSA by PCR Next Gen: NOT DETECTED

## 2022-01-26 LAB — HEPARIN LEVEL (UNFRACTIONATED): Heparin Unfractionated: <0.1 IU/mL — ABNORMAL LOW (ref 0.30–0.70)

## 2022-01-26 MED ORDER — CARVEDILOL 3.125 MG PO TABS
3.1250 mg | ORAL_TABLET | Freq: Two times a day (BID) | ORAL | Status: DC
  Filled 2022-01-26: qty 1

## 2022-01-26 MED ORDER — CARVEDILOL 3.125 MG PO TABS
3.1250 mg | ORAL_TABLET | Freq: Two times a day (BID) | ORAL | Status: DC
  Administered 2022-01-26: 3.125 mg via ORAL

## 2022-01-26 MED ORDER — ISOSORBIDE MONONITRATE ER 30 MG PO TB24: 30.0000 mg | ORAL_TABLET | Freq: Every day | ORAL | Status: DC

## 2022-01-26 MED ORDER — HYDRALAZINE HCL 50 MG PO TABS
50.0000 mg | ORAL_TABLET | Freq: Three times a day (TID) | ORAL | Status: DC
  Administered 2022-01-26: 50 mg via ORAL
  Filled 2022-01-26: qty 1

## 2022-01-26 MED ORDER — ISOSORBIDE MONONITRATE ER 30 MG PO TB24
30.0000 mg | ORAL_TABLET | Freq: Every day | ORAL | Status: DC
  Administered 2022-01-26: 30 mg via ORAL
  Filled 2022-01-26: qty 1

## 2022-01-26 MED ORDER — HYDRALAZINE HCL 50 MG PO TABS: 50.0000 mg | ORAL_TABLET | Freq: Three times a day (TID) | ORAL | Status: DC

## 2022-01-26 NOTE — Discharge Summary (Addendum)
Patient ID: Benjamin Valentine MRN: [id number] DOB/AGE: 1945-11-03 76 y.o.  Admit date: 01/25/2022 Discharge date: 01/26/2022  Supervising Physician: Julieanne Cotton  Patient Status: [facility] - In-pt  Admission Diagnoses: RICA stenosis   Discharge Diagnoses:  Principal Problem:   Status post angioplasty Active Problems:   Stenosis of intracranial portions of right internal carotid artery   Discharged Condition: good  Hospital Course:   Patient presented to [facility] for an cerebral angiogram with intervention with Dr. Corliss Skains on 01/25/22. Procedure occurred without major complications and patient was transferred to floor in stable condition (extubated w/o difficulty, VSS, R radial artery puncture site stable) for overnight observation. No major events occurred overnight.   Patient awake and alert  no complaints at this time. Son and RN at bedside. R RA puncture site stable.   Patient was instructed to: - No bending, stooping, lifting more than 10 pounds for 2 weeks - No driving for 2 weeks  - Keep the R wrist puncture site clean and dry until fully heals, no submerging (sitting in a tub, swimming) for 7 days - Continue to take Plavix 75 mg and ASA 81 mg daily  - Strongly encouraged to drink plenty of water  Plan to discharge home today and follow-up with Dr. Corliss Skains at [facility] in 2 weeks after discharge.  Consults: None  Significant Diagnostic Studies: none   Treatments: R ICA stent placement.   Discharge Exam: Blood pressure (!) 147/72, pulse 75, temperature 97.9 F (36.6 C), temperature source Oral, resp. rate 18, height 5\' 9"  (1.753 m), weight 212 lb 8.4 oz (96.4 kg), SpO2 94 %.  Physical Exam Vitals reviewed.  Constitutional:      General: He is not in acute distress.    Appearance: He is not ill-appearing.  HENT:     Head: Normocephalic.  Cardiovascular:     Rate and Rhythm: Normal rate and regular rhythm.  Pulmonary:     Effort: Pulmonary effort is normal.   Abdominal:     General: Abdomen is flat.  Skin:    General: Skin is warm and dry.     Coloration: Skin is not jaundiced or pale.     Findings: No bruising.     Comments: Positive dressing on R RA puncture site. Site is unremarkable with no erythema, edema, tenderness, bleeding or drainage. Minimal amount of old, dry blood noted on the dressing. Dressing otherwise clean, dry, and intact.    Neurological:     Mental Status: He is alert and oriented to person, place, and time.  Psychiatric:        Mood and Affect: Mood normal.        Behavior: Behavior normal.     Disposition: Discharge disposition: 01-Home or Self Care       Discharge Instructions     Call MD for:   Complete by: As directed    Call MD for:  difficulty breathing, headache or visual disturbances   Complete by: As directed    Call MD for:  extreme fatigue   Complete by: As directed    Call MD for:  hives   Complete by: As directed    Call MD for:  persistant dizziness or light-headedness   Complete by: As directed    Call MD for:  persistant nausea and vomiting   Complete by: As directed    Call MD for:  redness, tenderness, or signs of infection (pain, swelling, redness, odor or green/yellow discharge around incision site)  Complete by: As directed    Call MD for:  severe uncontrolled pain   Complete by: As directed    Call MD for:  temperature >100.4   Complete by: As directed    Diet - low sodium heart healthy   Complete by: As directed    Discharge wound care:   Complete by: As directed    You can change dressing on right wrist to a Band-aid tomorrow. The wound should fully heal in a week. Can take shower but keep the right wrist puncture site clean and dry. No submerging (swimming or sitting in a bath tub) for 7 days.   Driving Restrictions   Complete by: As directed    Do not drive for 2 weeks.   Increase activity slowly   Complete by: As directed    Lifting restrictions   Complete by: As  directed    No lifting  stooping, bending more than 10 pounds for 2 weeks.      Allergies as of 01/26/2022       Reactions   Entresto [sacubitril-valsartan]    Made Pt feel unwell         Medication List     TAKE these medications    allopurinol 100 MG tablet Commonly known as: ZYLOPRIM Take 1 tablet (100 mg total) by mouth daily.   aspirin EC 81 MG tablet Take 1 tablet (81 mg total) by mouth daily. Swallow whole.   atorvastatin 40 MG tablet Commonly known as: LIPITOR TAKE 1 TABLET BY MOUTH EVERY DAY   carvedilol 3.125 MG tablet Commonly known as: COREG TAKE 1 TABLET (3.125 MG TOTAL) BY MOUTH 2 (TWO) TIMES DAILY WITH A MEAL.   clopidogrel 75 MG tablet Commonly known as: PLAVIX Take 1 tablet (75 mg total) by mouth daily.   colchicine 0.6 MG tablet Take 2 tabs p.o. x1 then take 1 tab p.o. 1 hour later for gout attack. What changed:  how much to take how to take this when to take this reasons to take this   dapagliflozin propanediol 10 MG Tabs tablet Commonly known as: Farxiga Take 1 tablet (10 mg total) by mouth daily.   furosemide 20 MG tablet Commonly known as: LASIX Take 1 tablet (20 mg total) by mouth daily as needed (As needed for weight gain of 2 pounds overnight).   hydrALAZINE 50 MG tablet Commonly known as: APRESOLINE TAKE 1 TABLET BY MOUTH THREE TIMES A DAY   isosorbide mononitrate 30 MG 24 hr tablet Commonly known as: IMDUR Take 1 tablet (30 mg total) by mouth daily.   Sphygmomanometer Misc 1 each by Does not apply route daily.               Discharge Care Instructions  (From admission, onward)           Start     Ordered   01/26/22 0000  Discharge wound care:       Comments: You can change dressing on right wrist to a Band-aid tomorrow. The wound should fully heal in a week. Can take shower but keep the right wrist puncture site clean and dry. No submerging (swimming or sitting in a bath tub) for 7 days.   01/26/22 0848             Follow-up Information     Julieanne Cotton, MD Follow up.   Specialties: Interventional Radiology, Radiology Why: 2 week follow up visit Dr. Corliss Skains, our schedulers will call you to set up the  appointment. Contact information: [address] [place],  [place] [address] [place] [address] [phone]                  Electronically Signed: Willette Brace, [name] 01/26/2022, 9:32 AM   I have spent Less Than 30 Minutes discharging Benjamin Valentine.

## 2022-01-27 ENCOUNTER — Telehealth: Payer: Self-pay | Admitting: Family

## 2022-01-27 NOTE — Telephone Encounter (Signed)
Unable to reach or LVM for patient in attempt to schedule a follow up appointment.   Apostolos Blagg, NT

## 2022-01-29 ENCOUNTER — Encounter (HOSPITAL_COMMUNITY): Payer: Self-pay

## 2022-01-29 ENCOUNTER — Other Ambulatory Visit (HOSPITAL_COMMUNITY): Payer: Self-pay | Admitting: Interventional Radiology

## 2022-01-29 DIAGNOSIS — I6521 Occlusion and stenosis of right carotid artery: Secondary | ICD-10-CM

## 2022-01-29 HISTORY — PX: IR CT HEAD LTD: IMG2386

## 2022-01-29 HISTORY — PX: IR ANGIO INTRA EXTRACRAN SEL INTERNAL CAROTID UNI R MOD SED: IMG5362

## 2022-01-31 ENCOUNTER — Ambulatory Visit (INDEPENDENT_AMBULATORY_CARE_PROVIDER_SITE_OTHER): Payer: Medicare HMO | Admitting: Internal Medicine

## 2022-01-31 ENCOUNTER — Other Ambulatory Visit: Payer: Self-pay | Admitting: Internal Medicine

## 2022-01-31 ENCOUNTER — Encounter: Payer: Self-pay | Admitting: Internal Medicine

## 2022-01-31 VITALS — BP 138/66 | HR 79 | Ht 68.0 in | Wt 212.2 lb

## 2022-01-31 DIAGNOSIS — I129 Hypertensive chronic kidney disease with stage 1 through stage 4 chronic kidney disease, or unspecified chronic kidney disease: Secondary | ICD-10-CM

## 2022-01-31 DIAGNOSIS — I6521 Occlusion and stenosis of right carotid artery: Secondary | ICD-10-CM

## 2022-01-31 DIAGNOSIS — N1831 Chronic kidney disease, stage 3a: Secondary | ICD-10-CM | POA: Diagnosis not present

## 2022-01-31 DIAGNOSIS — I10 Essential (primary) hypertension: Secondary | ICD-10-CM

## 2022-01-31 DIAGNOSIS — I2510 Atherosclerotic heart disease of native coronary artery without angina pectoris: Secondary | ICD-10-CM

## 2022-01-31 DIAGNOSIS — I639 Cerebral infarction, unspecified: Secondary | ICD-10-CM

## 2022-01-31 DIAGNOSIS — M1A3790 Chronic gout due to renal impairment, unspecified ankle and foot, without tophus (tophi): Secondary | ICD-10-CM | POA: Diagnosis not present

## 2022-01-31 NOTE — Progress Notes (Signed)
Established Patient Office Visit  Subjective:  Patient ID: Benjamin Valentine, male    DOB: 10/06/1945  Age: 76 y.o. MRN: [id number]  CC: No chief complaint on file.   HPI  Benjamin Valentine presents for check up pt hs sten placed on rt side Past Medical History:  Diagnosis Date   Arthritis    CHF (congestive heart failure) (HCC)    Chronic HFrEF (heart failure with reduced ejection fraction) ([place])    a. 10/2020 Echo: EF 25-30%, nl RV fxn, mild MR.   CKD (chronic kidney disease), stage III ([place])    Coronary artery disease    a. 2002 s/p CABG x 2: LIMA->LAD, VG->RPDA; b. 11/2020 Cath: LM 20ost, LAD 172m LCX 99p/m, 918m, OM1 30, RCA 100p - fills via collats from LAD. RPL1 fills via collats from LCX. VG->PDA 100, LIMA->LAD ok-->med rx.   Gout    Hyperlipidemia LDL goal <70    Hypertension    Ischemic cardiomyopathy    a. 10/2020 Echo: EF 25-30%.   Osteoarthritis     Past Surgical History:  Procedure Laterality Date   CARDIAC CATHETERIZATION     CARDIAC SURGERY     CABG 2002   IR ANGIO EXTRACRAN SEL COM CAROTID INNOMINATE UNI BILAT MOD SED  01/03/2022   IR ANGIO INTRA EXTRACRAN SEL INTERNAL CAROTID UNI R MOD SED  01/29/2022   IR ANGIO VERTEBRAL SEL VERTEBRAL UNI L MOD SED  01/03/2022   IR CT HEAD LTD  01/29/2022   IR INTRA CRAN STENT  01/25/2022   IR RADIOLOGIST EVAL & MGMT  12/21/2021   IR [place] VASC ACCESS RIGHT  01/03/2022   IR [place] VASC ACCESS RIGHT  01/25/2022   LAPAROSCOPIC APPENDECTOMY N/A 08/11/2018   Procedure: APPENDECTOMY LAPAROSCOPIC;  Surgeon: PiOlean ReeMD;  Location: [facility];  Service: General;  Laterality: N/A;   RADIOLOGY WITH ANESTHESIA N/A 01/25/2022   Procedure: Cerebral angioplasty with possible stenting;  Surgeon: DeLuanne BrasMD;  [facility] Service: Radiology;  Laterality: N/A;   RIGHT/LEFT HEART CATH AND CORONARY/GRAFT ANGIOGRAPHY N/A 11/15/2020   Procedure: RIGHT/LEFT HEART CATH AND CORONARY/GRAFT ANGIOGRAPHY;  Surgeon: ArWellington HampshireMD;  [facility];  Service: Cardiovascular;  Laterality: N/A;    History reviewed. No pertinent family history.  Social History   Socioeconomic History   Marital status: Widowed    Spouse name: Not on file   Number of children: Not on file   Years of education: Not on file   Highest education level: Not on file  Occupational History   Not on file  Tobacco Use   Smoking status: Former   Smokeless tobacco: Never  Vaping Use   Vaping Use: Never used  Substance and Sexual Activity   Alcohol use: No   Drug use: No   Sexual activity: Not on file  Other Topics Concern   Not on file  Social History Narrative   Not on file   Social Determinants of Health   Financial Resource Strain: Low Risk  (11/25/2020)   Overall Financial Resource Strain (CARDIA)    Difficulty of Paying Living Expenses: Not very hard  Food Insecurity: No Food Insecurity (11/25/2020)   Hunger Vital Sign    Worried About Running Out of Food in the Last Year: Never true    Ran Out of Food in the Last Year: Never true  Transportation Needs: No Transportation Needs (11/25/2020)   PRAPARE - Transportation    Lack  of Transportation (Medical): No    Lack of Transportation (Non-Medical): No  Physical Activity: Insufficiently Active (11/25/2020)   Exercise Vital Sign    Days of Exercise per Week: 2 days    Minutes of Exercise per Session: 20 min  Stress: Stress Concern Present (11/25/2020)   [place]    Feeling of Stress : To some extent  Social Connections: Socially Isolated (11/25/2020)   Social Connection and Isolation Panel [NHANES]    Frequency of Communication with Friends and Family: More than three times a week    Frequency of Social Gatherings with Friends and Family: More than three times a week    Attends Religious Services: Never    Marine scientist or Organizations: No    Attends Theatre manager Meetings: Never    Marital Status: Widowed  Intimate Partner Violence: Not At Risk (11/25/2020)   Humiliation, Afraid, Rape, and Kick questionnaire    Fear of Current or Ex-Partner: No    Emotionally Abused: No    Physically Abused: No    Sexually Abused: No     Current Outpatient Medications:    allopurinol (ZYLOPRIM) 100 MG tablet, Take 1 tablet (100 mg total) by mouth daily., Disp: 90 tablet, Rfl: 3   aspirin EC 81 MG tablet, Take 1 tablet (81 mg total) by mouth daily. Swallow whole., Disp: , Rfl:    atorvastatin (LIPITOR) 40 MG tablet, TAKE 1 TABLET BY MOUTH EVERY DAY (Patient taking differently: Take 40 mg by mouth daily.), Disp: 90 tablet, Rfl: 2   Blood Pressure Monitoring (SPHYGMOMANOMETER) MISC, 1 each by Does not apply route daily., Disp: 1 each, Rfl: 0   carvedilol (COREG) 3.125 MG tablet, TAKE 1 TABLET (3.125 MG TOTAL) BY MOUTH 2 (TWO) TIMES DAILY WITH A MEAL., Disp: 180 tablet, Rfl: 2   clopidogrel (PLAVIX) 75 MG tablet, Take 1 tablet (75 mg total) by mouth daily., Disp: 30 tablet, Rfl: 2   colchicine 0.6 MG tablet, Take 2 tabs p.o. x1 then take 1 tab p.o. 1 hour later for gout attack. (Patient taking differently: Take 0.6 mg by mouth daily as needed (gout flare ups). Take 2 tabs p.o. x1 then take 1 tab p.o. 1 hour later for gout attack.), Disp: 20 tablet, Rfl: 0   dapagliflozin propanediol (FARXIGA) 10 MG TABS tablet, Take 1 tablet (10 mg total) by mouth daily., Disp: 90 tablet, Rfl: 3   furosemide (LASIX) 20 MG tablet, Take 1 tablet (20 mg total) by mouth daily as needed (As needed for weight gain of 2 pounds overnight)., Disp: 90 tablet, Rfl: 3   hydrALAZINE (APRESOLINE) 50 MG tablet, TAKE 1 TABLET BY MOUTH THREE TIMES A DAY, Disp: 270 tablet, Rfl: 2   isosorbide mononitrate (IMDUR) 30 MG 24 hr tablet, Take 1 tablet (30 mg total) by mouth daily., Disp: 90 tablet, Rfl: 3   Allergies  Allergen Reactions   Entresto [Sacubitril-Valsartan]     Made Pt feel unwell      ROS Review of Systems  Constitutional: Negative.   HENT: Negative.    Eyes: Negative.   Respiratory: Negative.    Cardiovascular: Negative.   Gastrointestinal: Negative.   Endocrine: Negative.   Genitourinary: Negative.   Musculoskeletal: Negative.   Skin: Negative.   Allergic/Immunologic: Negative.   Neurological: Negative.  Negative for speech difficulty.  Hematological: Negative.   Psychiatric/Behavioral: Negative.  Negative for behavioral problems, confusion and dysphoric mood.   All other systems reviewed  and are negative.     Objective:    Physical Exam Cardiovascular:     Rate and Rhythm: Normal rate and regular rhythm.  Pulmonary:     Effort: Pulmonary effort is normal.     Breath sounds: No wheezing.  Neurological:     General: No focal deficit present.  Psychiatric:        Mood and Affect: Mood normal.     BP 138/66   Pulse 79   Ht 5' 8" (1.727 m)   Wt 212 lb 3.2 oz (96.3 kg)   SpO2 93%   BMI 32.26 kg/m  Wt Readings from Last 3 Encounters:  01/31/22 212 lb 3.2 oz (96.3 kg)  01/25/22 212 lb 8.4 oz (96.4 kg)  01/03/22 210 lb (95.3 kg)     Health Maintenance Due  Topic Date Due   Hepatitis C Screening  Never done   DTaP/Tdap/Td (1 - Tdap) Never done   Medicare Annual Wellness (AWV)  11/25/2021    There are no preventive care reminders to display for this patient.  No results found for: "TSH" Lab Results  Component Value Date   WBC 15.3 (H) 01/26/2022   HGB 10.6 (L) 01/26/2022   HCT 34.3 (L) 01/26/2022   MCV 79.8 (L) 01/26/2022   PLT 417 (H) 01/26/2022   Lab Results  Component Value Date   NA 137 01/26/2022   K 4.2 01/26/2022   CO2 20 (L) 01/26/2022   GLUCOSE 141 (H) 01/26/2022   BUN 23 01/26/2022   CREATININE 1.53 (H) 01/26/2022   BILITOT 0.5 11/24/2021   ALKPHOS 46 11/24/2021   AST 13 (L) 11/24/2021   ALT 12 11/24/2021   PROT 7.8 11/24/2021   ALBUMIN 3.9 11/24/2021   CALCIUM 8.9 01/26/2022   ANIONGAP 11 01/26/2022   EGFR  55 (L) 12/31/2020   Lab Results  Component Value Date   CHOL 112 11/25/2021   Lab Results  Component Value Date   HDL 26 (L) 11/25/2021   Lab Results  Component Value Date   LDLCALC 64 11/25/2021   Lab Results  Component Value Date   TRIG 108 11/25/2021   Lab Results  Component Value Date   CHOLHDL 4.3 11/25/2021   Lab Results  Component Value Date   HGBA1C 6.4 (H) 11/24/2021      Assessment & Plan:   Problem List Items Addressed This Visit       Cardiovascular and Mediastinum   Coronary artery disease, non-occlusive    Denies any chest pain      Essential hypertension    Blood pressure is under control      Stenosis of intracranial portions of right internal carotid artery - Primary    Patient is doing well        Nervous and Auditory   Brainstem infarction [facility])    Patient has not trouble walking and speech        Musculoskeletal and Integument   Chronic gout due to renal impairment involving foot without tophus    Stable        Genitourinary   Stage 3a chronic kidney disease ([place])    No orders of the defined types were placed in this encounter.   Follow-up: No follow-ups on file.    Cletis Athens, MD

## 2022-01-31 NOTE — Assessment & Plan Note (Signed)
Blood pressure is under control 

## 2022-01-31 NOTE — Assessment & Plan Note (Addendum)
Patient has not trouble walking and speech

## 2022-01-31 NOTE — Assessment & Plan Note (Signed)
Denies any chest pain

## 2022-01-31 NOTE — Assessment & Plan Note (Signed)
Stable

## 2022-01-31 NOTE — Assessment & Plan Note (Signed)
Patient is doing well

## 2022-02-07 ENCOUNTER — Telehealth (HOSPITAL_COMMUNITY): Payer: Self-pay

## 2022-02-07 NOTE — Telephone Encounter (Signed)
Called to schedule 2 wk f/u, no answer, no vm. AW  

## 2022-02-22 ENCOUNTER — Other Ambulatory Visit: Payer: Self-pay | Admitting: Internal Medicine

## 2022-05-02 ENCOUNTER — Ambulatory Visit: Payer: Medicare HMO | Admitting: Internal Medicine

## 2022-05-30 ENCOUNTER — Telehealth (HOSPITAL_COMMUNITY): Payer: Self-pay

## 2022-05-30 LAB — PLATELET INHIBITION P2Y12

## 2022-05-30 NOTE — Telephone Encounter (Signed)
Called to schedule f/u, no answer, vm not set up. AB

## 2022-08-24 ENCOUNTER — Other Ambulatory Visit: Payer: Self-pay | Admitting: Nurse Practitioner

## 2022-08-25 NOTE — Telephone Encounter (Signed)
Requested medication (s) are due for refill today: yes  Requested medication (s) are on the active medication list: yes  Last refill:  06/29/21  Future visit scheduled: yes  Notes to clinic: Unable to refill per protocol, last refill by another provider.      Requested Prescriptions  Pending Prescriptions Disp Refills   furosemide (LASIX) 20 MG tablet [Pharmacy Med Name: FUROSEMIDE 20 MG TABLET] 90 tablet 3    Sig: TAKE 1 TABLET BY MOUTH DAILY AS NEEDED (AS NEEDED FOR WEIGHT GAIN OF 2 POUNDS OVERNIGHT).     There is no refill protocol information for this order

## 2022-09-21 NOTE — Progress Notes (Signed)
BP 132/78   Pulse 87   Temp 98.1 F (36.7 C) (Oral)   Resp 16   Ht 5\' 9"  (1.753 m)   Wt 215 lb 4.8 oz (97.7 kg)   SpO2 97%   BMI 31.79 kg/m    Subjective:    Patient ID: Benjamin Valentine, male    DOB: 1946/01/01, 77 y.o.   MRN: [id number]  HPI: Benjamin Valentine is a 77 y.o. male  Chief Complaint  Patient presents with   Establish Care   Hypertension   Hyperlipidemia   Establish care: his last physical was years.  Medical history includes listed below.   Health Maintenance labs.   Hypertension:  -Medications: hydralazine 50 mg three times a day, carvediolol 3.125 mg BID,  -Patient is compliant with above medications and reports no side effects. -Checking BP at home (average): does not check his blood pressure -Denies any SOB, CP, vision changes, LE edema or symptoms of hypotension -Diet: eats what he wants -Exercise: does not exercise   Hx of TIA/ Hx of brainstem infarction/Stenosis of intracranial portions of right internal carotid artery/cerebral stenting: He had cerebral angioplasty and stenting done on 01/25/2022.  Patient presented to [facility] for an cerebral angiogram with intervention with Dr. Corliss Skains on 01/25/22. Procedure occurred without major complications and patient was transferred to floor in stable condition (extubated w/o difficulty, VSS, R radial artery puncture site stable) for overnight observation. No major events occurred overnight. Patient was to follow up with Dr. Corliss Skains two weeks later but he never did.  Provided patient information to schedule appointment.  He is currently taking asa 81 mg daily, and plavix 75 mg daily.    Heart failure/CAD/hx of CABG: was established with Dr. Mariah Milling, cardiology. He was last seen on 02/16/2021. Imdur 30 mg daily, lasix 20 mg daily, farxiga 10 mg daily. He was established with heart failure clinic last seen on 10/3/202. Provided information to call and schedule appointment.  He denies any chest pain, he says he does have  some shortness of breath with exertion.    HLD:  -Medications: atorvastatin 40 mg daily -Patient is compliant with above medications and reports no side effects.  -Last lipid panel:  Lipid Panel     Component Value Date/Time   CHOL 112 11/25/2021 0745   TRIG 108 11/25/2021 0745   HDL 26 (L) 11/25/2021 0745   CHOLHDL 4.3 11/25/2021 0745   VLDL 22 11/25/2021 0745   LDLCALC 64 11/25/2021 0745    Gout:  allopurinol 100 mg daily. Colchicine as needed for flares.  Has not had a gout attack recently.    COPD:  -COPD status: stable -Current medications: none -Satisfied with current treatment?: no -Oxygen use: no -Dyspnea frequency: none -Cough frequency: none -Rescue inhaler frequency:none   -Limitation of activity: no -Productive cough: no -Last Spirometry/PFTs:  -Pneumovax: Not up to Date, patient declined -Influenza: Not up to Date   Obesity:  Current weight : 215 lbs BMI: 31.79 Treatment Tried: none Comorbidities: copd, htn, hld   Relevant past medical, surgical, family and social history reviewed and updated as indicated. Interim medical history since our last visit reviewed. Allergies and medications reviewed and updated.  Review of Systems  Constitutional: Negative for fever or weight change.  Respiratory: Negative for cough and shortness of breath.   Cardiovascular: Negative for chest pain or palpitations.  Gastrointestinal: Negative for abdominal pain, no bowel changes.  Musculoskeletal: Negative for gait problem or joint swelling.  Skin: Negative for rash.  Neurological: Negative for dizziness or headache.  No other specific complaints in a complete review of systems (except as listed in HPI above).      Objective:    BP 132/78   Pulse 87   Temp 98.1 F (36.7 C) (Oral)   Resp 16   Ht 5\' 9"  (1.753 m)   Wt 215 lb 4.8 oz (97.7 kg)   SpO2 97%   BMI 31.79 kg/m   Wt Readings from Last 3 Encounters:  09/22/22 215 lb 4.8 oz (97.7 kg)  01/31/22 212 lb 3.2  oz (96.3 kg)  01/25/22 212 lb 8.4 oz (96.4 kg)    Physical Exam  Constitutional: Patient appears well-developed and well-nourished. Obese  No distress.  HEENT: head atraumatic, normocephalic, pupils equal and reactive to light, neck supple, throat within normal limits Cardiovascular: Normal rate, regular rhythm and normal heart sounds.  No murmur heard. No BLE edema. Pulmonary/Chest: Effort normal and breath sounds normal. No respiratory distress. Abdominal: Soft.  There is no tenderness. Psychiatric: Patient has a normal mood and affect. behavior is normal. Judgment and thought content normal.      Assessment & Plan:   Problem List Items Addressed This Visit       Cardiovascular and Mediastinum   Coronary artery disease, non-occlusive    Overdue for follow up with cardiology and heart failure clinic. Information provided to schedule appointments.  Patient continues to take hydralazine 50 mg three times a day, carvediolol 3.125 mg BID, Imdur 30 mg daily, lasix 20 mg daily, farxiga 10 mg daily.       Relevant Medications   furosemide (LASIX) 20 MG tablet   isosorbide mononitrate (IMDUR) 30 MG 24 hr tablet   Other Relevant Orders   COMPLETE METABOLIC PANEL WITH GFR   Lipid panel   Essential hypertension - Primary    Overdue for follow up with cardiology and heart failure clinic. Information provided to schedule appointments.  Patient continues to take hydralazine 50 mg three times a day, carvediolol 3.125 mg BID, Imdur 30 mg daily, lasix 20 mg daily, farxiga 10 mg daily.       Relevant Medications   furosemide (LASIX) 20 MG tablet   isosorbide mononitrate (IMDUR) 30 MG 24 hr tablet   Other Relevant Orders   CBC with Differential/Platelet   COMPLETE METABOLIC PANEL WITH GFR   Stenosis of intracranial portions of right internal carotid artery    Overdue for follow up appointment with Dr. Corliss Skains, interventional radiology.  Provided information to call and schedule appointment.  Patient reports he is doing well. Continue plavix 75 mg daily and asa 81 mg daily.       Relevant Medications   clopidogrel (PLAVIX) 75 MG tablet   furosemide (LASIX) 20 MG tablet   isosorbide mononitrate (IMDUR) 30 MG 24 hr tablet   HFrEF (heart failure with reduced ejection fraction) (HCC)    Overdue for follow up with cardiology and heart failure clinic. Information provided to schedule appointments.  Patient continues to take hydralazine 50 mg three times a day, carvediolol 3.125 mg BID, Imdur 30 mg daily, lasix 20 mg daily, farxiga 10 mg daily.       Relevant Medications   furosemide (LASIX) 20 MG tablet   isosorbide mononitrate (IMDUR) 30 MG 24 hr tablet     Respiratory   COPD exacerbation (HCC)    Stable, no medications         Nervous and Auditory   Brainstem infarction (HCC)    Overdue  for follow up appointment with Dr. Corliss Skains, interventional radiology.  Provided information to call and schedule appointment. Patient reports he is doing well. Continue plavix 75 mg daily and asa 81 mg daily.         Musculoskeletal and Integument   Chronic gout due to renal impairment involving foot without tophus    Takes allopurinol daily, uses colchicine for flare        Other   Hx of CABG   Relevant Medications   furosemide (LASIX) 20 MG tablet   isosorbide mononitrate (IMDUR) 30 MG 24 hr tablet   Morbid (severe) obesity due to excess calories (HCC)    Encouraged to work on lifestyle modification      Hyperlipidemia    Getting labs today currently on atorvastatin 40 mg daily      Relevant Medications   furosemide (LASIX) 20 MG tablet   isosorbide mononitrate (IMDUR) 30 MG 24 hr tablet   Other Relevant Orders   COMPLETE METABOLIC PANEL WITH GFR   Lipid panel   History of TIA (transient ischemic attack)    Overdue for follow up appointment with Dr. Corliss Skains, interventional radiology.  Provided information to call and schedule appointment. Patient reports he is doing  well. Continue plavix 75 mg daily and asa 81 mg daily.       Relevant Medications   clopidogrel (PLAVIX) 75 MG tablet   Other Visit Diagnoses     Screening for HIV without presence of risk factors       Relevant Orders   HIV Antibody (routine testing w rflx)   Encounter for hepatitis C screening test for low risk patient       Relevant Orders   Hepatitis C antibody   Screening for diabetes mellitus       Relevant Orders   Hemoglobin A1c        Follow up plan: Return in about 4 months (around 01/22/2023) for follow up.

## 2022-09-22 ENCOUNTER — Encounter: Payer: Self-pay | Admitting: Nurse Practitioner

## 2022-09-22 ENCOUNTER — Other Ambulatory Visit: Payer: Self-pay

## 2022-09-22 ENCOUNTER — Other Ambulatory Visit: Payer: Self-pay | Admitting: Nurse Practitioner

## 2022-09-22 ENCOUNTER — Ambulatory Visit (INDEPENDENT_AMBULATORY_CARE_PROVIDER_SITE_OTHER): Payer: 59 | Admitting: Nurse Practitioner

## 2022-09-22 VITALS — BP 132/78 | HR 87 | Temp 98.1°F | Resp 16 | Ht 69.0 in | Wt 215.3 lb

## 2022-09-22 DIAGNOSIS — E785 Hyperlipidemia, unspecified: Secondary | ICD-10-CM | POA: Diagnosis not present

## 2022-09-22 DIAGNOSIS — Z131 Encounter for screening for diabetes mellitus: Secondary | ICD-10-CM

## 2022-09-22 DIAGNOSIS — Z951 Presence of aortocoronary bypass graft: Secondary | ICD-10-CM

## 2022-09-22 DIAGNOSIS — I5020 Unspecified systolic (congestive) heart failure: Secondary | ICD-10-CM | POA: Diagnosis not present

## 2022-09-22 DIAGNOSIS — I639 Cerebral infarction, unspecified: Secondary | ICD-10-CM | POA: Diagnosis not present

## 2022-09-22 DIAGNOSIS — I2510 Atherosclerotic heart disease of native coronary artery without angina pectoris: Secondary | ICD-10-CM | POA: Diagnosis not present

## 2022-09-22 DIAGNOSIS — I6521 Occlusion and stenosis of right carotid artery: Secondary | ICD-10-CM

## 2022-09-22 DIAGNOSIS — Z114 Encounter for screening for human immunodeficiency virus [HIV]: Secondary | ICD-10-CM

## 2022-09-22 DIAGNOSIS — I10 Essential (primary) hypertension: Secondary | ICD-10-CM | POA: Diagnosis not present

## 2022-09-22 DIAGNOSIS — J441 Chronic obstructive pulmonary disease with (acute) exacerbation: Secondary | ICD-10-CM | POA: Insufficient documentation

## 2022-09-22 DIAGNOSIS — Z1159 Encounter for screening for other viral diseases: Secondary | ICD-10-CM

## 2022-09-22 DIAGNOSIS — D649 Anemia, unspecified: Secondary | ICD-10-CM | POA: Diagnosis not present

## 2022-09-22 DIAGNOSIS — M1A3790 Chronic gout due to renal impairment, unspecified ankle and foot, without tophus (tophi): Secondary | ICD-10-CM

## 2022-09-22 DIAGNOSIS — Z8673 Personal history of transient ischemic attack (TIA), and cerebral infarction without residual deficits: Secondary | ICD-10-CM

## 2022-09-22 DIAGNOSIS — J449 Chronic obstructive pulmonary disease, unspecified: Secondary | ICD-10-CM | POA: Insufficient documentation

## 2022-09-22 LAB — CBC WITH DIFFERENTIAL/PLATELET
Absolute Monocytes: 1072 cells/uL — ABNORMAL HIGH (ref 200–950)
Basophils Absolute: 53 cells/uL (ref 0–200)
Basophils Relative: 0.7 %
Eosinophils Absolute: 312 cells/uL (ref 15–500)
Eosinophils Relative: 4.1 %
HCT: 34.8 % — ABNORMAL LOW (ref 38.5–50.0)
Hemoglobin: 9.9 g/dL — ABNORMAL LOW (ref 13.2–17.1)
Lymphs Abs: 2006 cells/uL (ref 850–3900)
MCH: 20.9 pg — ABNORMAL LOW (ref 27.0–33.0)
MCHC: 28.4 g/dL — ABNORMAL LOW (ref 32.0–36.0)
MCV: 73.6 fL — ABNORMAL LOW (ref 80.0–100.0)
MPV: 10.2 fL (ref 7.5–12.5)
Monocytes Relative: 14.1 %
Neutro Abs: 4157 cells/uL (ref 1500–7800)
Neutrophils Relative %: 54.7 %
Platelets: 490 10*3/uL — ABNORMAL HIGH (ref 140–400)
RBC: 4.73 10*6/uL (ref 4.20–5.80)
RDW: 18.4 % — ABNORMAL HIGH (ref 11.0–15.0)
Total Lymphocyte: 26.4 %
WBC: 7.6 10*3/uL (ref 3.8–10.8)

## 2022-09-22 MED ORDER — FUROSEMIDE 20 MG PO TABS: 20.0000 mg | ORAL_TABLET | Freq: Every day | ORAL | 3 refills | Status: DC | PRN

## 2022-09-22 MED ORDER — ISOSORBIDE MONONITRATE ER 30 MG PO TB24: 30.0000 mg | ORAL_TABLET | Freq: Every day | ORAL | 3 refills | Status: DC

## 2022-09-22 MED ORDER — CLOPIDOGREL BISULFATE 75 MG PO TABS: 75.0000 mg | ORAL_TABLET | Freq: Every day | ORAL | 1 refills | Status: DC

## 2022-09-22 NOTE — Assessment & Plan Note (Signed)
Overdue for follow up with cardiology and heart failure clinic. Information provided to schedule appointments.  Patient continues to take hydralazine 50 mg three times a day, carvediolol 3.125 mg BID, Imdur 30 mg daily, lasix 20 mg daily, farxiga 10 mg daily.

## 2022-09-22 NOTE — Assessment & Plan Note (Signed)
Overdue for follow up appointment with Dr. Corliss Skains, interventional radiology.  Provided information to call and schedule appointment. Patient reports he is doing well. Continue plavix 75 mg daily and asa 81 mg daily.

## 2022-09-22 NOTE — Assessment & Plan Note (Signed)
Getting labs today currently on atorvastatin 40 mg daily

## 2022-09-22 NOTE — Assessment & Plan Note (Signed)
Encouraged to work on lifestyle modification

## 2022-09-22 NOTE — Assessment & Plan Note (Signed)
Stable, no medications

## 2022-09-22 NOTE — Assessment & Plan Note (Signed)
Takes allopurinol daily, uses colchicine for flare

## 2022-09-25 ENCOUNTER — Other Ambulatory Visit: Payer: Self-pay | Admitting: Nurse Practitioner

## 2022-09-25 DIAGNOSIS — N1832 Chronic kidney disease, stage 3b: Secondary | ICD-10-CM

## 2022-09-27 ENCOUNTER — Other Ambulatory Visit: Payer: Self-pay | Admitting: Nurse Practitioner

## 2022-09-27 DIAGNOSIS — D509 Iron deficiency anemia, unspecified: Secondary | ICD-10-CM

## 2022-09-27 DIAGNOSIS — E1165 Type 2 diabetes mellitus with hyperglycemia: Secondary | ICD-10-CM

## 2022-09-27 MED ORDER — METFORMIN HCL 500 MG PO TABS: 500.0000 mg | ORAL_TABLET | Freq: Every day | ORAL | 1 refills | Status: DC

## 2022-09-27 MED ORDER — IRON (FERROUS SULFATE) 325 (65 FE) MG PO TABS: 325.0000 mg | ORAL_TABLET | Freq: Every day | ORAL | 1 refills | Status: DC

## 2022-11-11 ENCOUNTER — Other Ambulatory Visit: Payer: Self-pay | Admitting: Family

## 2022-11-16 ENCOUNTER — Other Ambulatory Visit: Payer: Self-pay | Admitting: Emergency Medicine

## 2022-11-16 DIAGNOSIS — Z8673 Personal history of transient ischemic attack (TIA), and cerebral infarction without residual deficits: Secondary | ICD-10-CM

## 2022-11-16 DIAGNOSIS — I6521 Occlusion and stenosis of right carotid artery: Secondary | ICD-10-CM

## 2022-11-16 MED ORDER — ATORVASTATIN CALCIUM 40 MG PO TABS: 40.0000 mg | ORAL_TABLET | Freq: Every day | ORAL | 2 refills | Status: DC

## 2022-11-16 MED ORDER — CLOPIDOGREL BISULFATE 75 MG PO TABS: 75.0000 mg | ORAL_TABLET | Freq: Every day | ORAL | 1 refills | Status: DC

## 2022-11-16 MED ORDER — DAPAGLIFLOZIN PROPANEDIOL 10 MG PO TABS: 10.0000 mg | ORAL_TABLET | Freq: Every day | ORAL | 3 refills | Status: DC

## 2022-11-16 NOTE — Progress Notes (Signed)
PCP: Benjamin Goo, [name] (last seen 08/24) Primary Cardiologist: Benjamin Nordmann, MD (last seen 11/22)  HPI:  [name] is a 77 y/o male with a history of CAD (CABG 2022), HTN, hyperlipidemia, CKD, arthritis, gout, stroke (10/23), carotid stenosis (angiogram 12/23), previous tobacco use and chronic heart failure.   Admitted 11/11/2020 for elevated tropinin. He presented to the hospital with increasing SOB along with ABD distention, and orthopena. Initial HStrop 59, EKG showed T wave inversion in the inferolateral leads. Echo showed decreased LV function with EF 25-30%, mid-mod LV dilation, mild MR. Underwent R/L HC which showed significant 3-vessel disease with patent LIMA to LAD with occluded SVG to RCA and SVG to OM. RHC showed high normal filling pressure, moderate pulmonary hypertension, and mod to severe reduced CO. He was diuresed with lasix but had increase creatinine above baseline. Admitted back to the hospital on 11/25/20 after presenting with new onset left arm and left knee numbness and weakness. Stroke workup was performed and was negative. He was seen by neurology and was thought symptoms were due to TIA.   Was in the ED 06/01/21 due to dizziness, headache and chest pain. Accidentally took a double dose of his medications today. Symptoms improved and he was released. Was in the ED 08/13/21 due to left wrist gout where he was evaluated and released. Was in the ED 10/23/21 due to left wrist gout where he was treated and released.   Admitted 11/24/21 due to slurred speech and facial droop. Code stroke initiated on arrival. Low NIH, not a candidate for TNK. Discussed with neurology who recommends hospitalization for further stroke work-up. MRI is positive for acute infarct. CTA H&N showed severe multifocal stenosis. Held coreg, Entresto, Lasix, hydralazine and Imdur for permissive HTN, to be resumed after discharge. 01/25/22 had cerebral angiogram with intervention for RICA stenosis.   Echo 11/11/20:  EF 25-30% along with mild MR.   RHC/LHC done 11/15/20 and showed: Ost LM lesion is 20% stenosed.   Prox Cx to Mid Cx lesion is 99% stenosed.   Mid Cx to Dist Cx lesion is 99% stenosed.   1st Mrg lesion is 30% stenosed.   Mid LAD lesion is 100% stenosed.   Prox RCA lesion is 100% stenosed.   Origin lesion is 100% stenosed.   SVG.   LIMA graft was visualized by angiography and is normal in caliber.   The graft exhibits no disease. 1.  Significant underlying three-vessel coronary artery disease with occluded mid LAD, occluded proximal right coronary artery and subtotal occlusion of the mid left circumflex after the origin of a large high OM1 which supplies the majority of the left circumflex distribution. Patent LIMA to LAD with occluded SVG to RCA and SVG to OM. The RCA gets left-to-right collaterals. 2.  Left ventricular angiography was not performed due to chronic kidney disease. 3.  Right heart catheterization showed high normal filling pressures, moderate pulmonary hypertension and moderately to severely reduced cardiac output.  He presents today for a follow-up visit with a chief complaint of moderate shortness of breath with minimal exertion. Chronic in nature although has worsened over the last 2 weeks. Says that walking to the office from his truck caused him to get short of breath and "woozy". Has associated fatigue and dizziness along with this. Denies chest pain, palpitations, abdominal distention, pedal edema or difficulty sleeping.   Has been snacking on potato chips. Adding salt to some foods, like eggs. Doesn't eat out often. Drinking 32-48 oz water and a  20 oz soda daily.   When reviewing his medications, he says that he frequently forgets to take the PM dose of carvedilol. The family friend, Benjamin Valentine, who does his pill box says that more days than not, it is left in the pill box. Has not had to take his furosemide in "awhile"   ROS: All systems negative except as listed in HPI,  PMH and Problem List.  SH:  Social History   Socioeconomic History   Marital status: Widowed    Spouse name: Not on file   Number of children: Not on file   Years of education: Not on file   Highest education level: Not on file  Occupational History   Not on file  Tobacco Use   Smoking status: Former   Smokeless tobacco: Never  Vaping Use   Vaping status: Never Used  Substance and Sexual Activity   Alcohol use: No   Drug use: No   Sexual activity: Not on file  Other Topics Concern   Not on file  Social History Narrative   Not on file   Social Determinants of Health   Financial Resource Strain: Low Risk  (11/25/2020)   Overall Financial Resource Strain (CARDIA)    Difficulty of Paying Living Expenses: Not very hard  Food Insecurity: No Food Insecurity (11/25/2020)   Hunger Vital Sign    Worried About Running Out of Food in the Last Year: Never true    Ran Out of Food in the Last Year: Never true  Transportation Needs: No Transportation Needs (11/25/2020)   PRAPARE - Administrator, Civil Service (Medical): No    Lack of Transportation (Non-Medical): No  Physical Activity: Insufficiently Active (11/25/2020)   Exercise Vital Sign    Days of Exercise per Week: 2 days    Minutes of Exercise per Session: 20 min  Stress: Stress Concern Present (11/25/2020)   [facility] [employer] - Occupational Stress Questionnaire    Feeling of Stress : To some extent  Social Connections: Socially Isolated (11/25/2020)   Social Connection and Isolation Panel [NHANES]    Frequency of Communication with Friends and Family: More than three times a week    Frequency of Social Gatherings with Friends and Family: More than three times a week    Attends Religious Services: Never    Database administrator or Organizations: No    Attends Banker Meetings: Never    Marital Status: Widowed  Intimate Partner Violence: Not At Risk (11/25/2020)    Humiliation, Afraid, Rape, and Kick questionnaire    Fear of Current or Ex-Partner: No    Emotionally Abused: No    Physically Abused: No    Sexually Abused: No    FH: No family history on file.  Past Medical History:  Diagnosis Date   Arthritis    CHF (congestive heart failure) (HCC)    Chronic HFrEF (heart failure with reduced ejection fraction) (HCC)    a. 10/2020 Echo: EF 25-30%, nl RV fxn, mild MR.   CKD (chronic kidney disease), stage III (HCC)    Coronary artery disease    a. 2002 s/p CABG x 2: LIMA->LAD, VG->RPDA; b. 11/2020 Cath: LM 20ost, LAD 179m, LCX 99p/m, 7m/d, OM1 30, RCA 100p - fills via collats from LAD. RPL1 fills via collats from LCX. VG->PDA 100, LIMA->LAD ok-->med rx.   Gout    Hyperlipidemia LDL goal <70    Hypertension    Ischemic cardiomyopathy    a.  10/2020 Echo: EF 25-30%.   Osteoarthritis     Current Outpatient Medications  Medication Sig Dispense Refill   allopurinol (ZYLOPRIM) 100 MG tablet Take 1 tablet (100 mg total) by mouth daily. 90 tablet 3   atorvastatin (LIPITOR) 40 MG tablet TAKE 1 TABLET BY MOUTH EVERY DAY 90 tablet 2   Blood Pressure Monitoring (SPHYGMOMANOMETER) MISC 1 each by Does not apply route daily. (Patient not taking: Reported on 09/22/2022) 1 each 0   carvedilol (COREG) 3.125 MG tablet TAKE 1 TABLET BY MOUTH TWICE A DAY WITH A MEAL 180 tablet 2   clopidogrel (PLAVIX) 75 MG tablet Take 1 tablet (75 mg total) by mouth daily. 90 tablet 1   colchicine 0.6 MG tablet Take 2 tabs p.o. x1 then take 1 tab p.o. 1 hour later for gout attack. (Patient taking differently: Take 0.6 mg by mouth daily as needed (gout flare ups). Take 2 tabs p.o. x1 then take 1 tab p.o. 1 hour later for gout attack.) 20 tablet 0   dapagliflozin propanediol (FARXIGA) 10 MG TABS tablet Take 1 tablet (10 mg total) by mouth daily. 90 tablet 3   furosemide (LASIX) 20 MG tablet Take 1 tablet (20 mg total) by mouth daily as needed (As needed for weight gain of 2 pounds  overnight). 90 tablet 3   hydrALAZINE (APRESOLINE) 50 MG tablet TAKE 1 TABLET BY MOUTH THREE TIMES A DAY 270 tablet 2   Iron, Ferrous Sulfate, 325 (65 Fe) MG TABS Take 325 mg by mouth daily. 90 tablet 1   isosorbide mononitrate (IMDUR) 30 MG 24 hr tablet Take 1 tablet (30 mg total) by mouth daily. 90 tablet 3   metFORMIN (GLUCOPHAGE) 500 MG tablet Take 1 tablet (500 mg total) by mouth daily with breakfast. 90 tablet 1   No current facility-administered medications for this visit.   Vitals:   11/17/22 0836  BP: (!) 148/68  Pulse: 69  SpO2: 100%  Weight: 213 lb 4 oz (96.7 kg)   Wt Readings from Last 3 Encounters:  11/17/22 213 lb 4 oz (96.7 kg)  09/22/22 215 lb 4.8 oz (97.7 kg)  01/31/22 212 lb 3.2 oz (96.3 kg)   Lab Results  Component Value Date   CREATININE 1.79 (H) 09/22/2022   CREATININE 1.53 (H) 01/26/2022   CREATININE 1.48 (H) 01/25/2022   PHYSICAL EXAM:  General:  Well appearing. No resp difficulty HEENT: normal Neck: supple. JVP flat. No lymphadenopathy or thryomegaly appreciated. Cor: PMI normal. Regular rate & rhythm. No rubs, gallops or murmurs. Lungs: clear Abdomen: soft, nontender, nondistended. No hepatosplenomegaly. No bruits or masses.  Extremities: no cyanosis, clubbing, rash, edema Neuro: alert & orientedx3, cranial nerves grossly intact. Moves all 4 extremities w/o difficulty. Affect pleasant.   ECG: not done  ReDs: 35%   ASSESSMENT & PLAN:  1: NICM with reduced ejection fraction- - suspect due to HTN - NYHA class III - euvolemic today - not weighing daily but does have scales; encouraged to resume weighing daily and to call for an overnight weight gain of > 2 pounds or a weekly weight gain of > 5 pounds - weight up 3 pounds from last visit here 1 year ago - ReDs 35% - Echo 11/11/20: EF 25-30% along with mild MR.  - will get echo updated - not adding salt and friend ("daughter") does most of the cooking and doesn't cook with salt either - stop  carvedilol as he's only taking it consistently once daily - begin metoprolol succinate 25mg   daily - continue farxiga 10mg  daily - continue furosemide 20mg  PRN - continue hydralazine 50mg  TID/ isosorbide MN 30mg  daily - unable to tolerate entresto - consider valsartan or losartan depending on lab results today as potassium has been a little high - BNP 11/11/20 was 616.5 - BNP today  2: HTN with CKD- - BP 148/68 - changing beta-blocker per above - saw PCP Benjamin Valentine) 08/24 - BMP 09/22/22 reviewed and showed sodium 138, potassium 5.3, creatinine 1.79 and GFR 38 - BMP today - has upcoming nephrology appt in a couple of weeks  3: CAD- - CABG back in 2002 - saw cardiology Benjamin Valentine) 11/22; office # provided on AVS for them to call and schedule appt - continue atorvastatin 40mg  daily - continue plavix 75mg  daily - LDL 09/22/22 was 78 - RHC/LHC done 11/15/20 and showed: Ost LM lesion is 20% stenosed.   Prox Cx to Mid Cx lesion is 99% stenosed.   Mid Cx to Dist Cx lesion is 99% stenosed.   1st Mrg lesion is 30% stenosed.   Mid LAD lesion is 100% stenosed.   Prox RCA lesion is 100% stenosed.   Origin lesion is 100% stenosed.   SVG.   LIMA graft was visualized by angiography and is normal in caliber.   The graft exhibits no disease. 1.  Significant underlying three-vessel coronary artery disease with occluded mid LAD, occluded proximal right coronary artery and subtotal occlusion of the mid left circumflex after the origin of a large high OM1 which supplies the majority of the left circumflex distribution. Patent LIMA to LAD with occluded SVG to RCA and SVG to OM. The RCA gets left-to-right collaterals. 2.  Left ventricular angiography was not performed due to chronic kidney disease. 3.  Right heart catheterization showed high normal filling pressures, moderate pulmonary hypertension and moderately to severely reduced cardiac output.  4: Stroke- - 01/25/22 had cerebral angiogram with intervention  for RICA stenosis - did not f/u with Dr. Corliss Valentine, interventional radiology (PCP gave him contact #) - LDL 09/22/22 was 78 - continue atorvastatin 40mg  daily  5: Diabetes- - A1c 09/22/22 was 7.0% - has not started metformin yet  Return in 2 weeks, sooner if needed.

## 2022-11-17 ENCOUNTER — Telehealth: Payer: Self-pay

## 2022-11-17 ENCOUNTER — Encounter: Payer: Self-pay | Admitting: Family

## 2022-11-17 ENCOUNTER — Other Ambulatory Visit
Admission: RE | Admit: 2022-11-17 | Discharge: 2022-11-17 | Disposition: A | Discharge status: Home / Self Care | Payer: 59 | Source: Ambulatory Visit | Attending: Family | Admitting: Family

## 2022-11-17 ENCOUNTER — Ambulatory Visit (HOSPITAL_BASED_OUTPATIENT_CLINIC_OR_DEPARTMENT_OTHER): Payer: 59 | Admitting: Family

## 2022-11-17 VITALS — BP 148/68 | HR 69 | Wt 213.2 lb

## 2022-11-17 DIAGNOSIS — I639 Cerebral infarction, unspecified: Secondary | ICD-10-CM

## 2022-11-17 DIAGNOSIS — Z8673 Personal history of transient ischemic attack (TIA), and cerebral infarction without residual deficits: Secondary | ICD-10-CM | POA: Diagnosis not present

## 2022-11-17 DIAGNOSIS — I110 Hypertensive heart disease with heart failure: Secondary | ICD-10-CM | POA: Diagnosis present

## 2022-11-17 DIAGNOSIS — I2510 Atherosclerotic heart disease of native coronary artery without angina pectoris: Secondary | ICD-10-CM | POA: Insufficient documentation

## 2022-11-17 DIAGNOSIS — I10 Essential (primary) hypertension: Secondary | ICD-10-CM | POA: Diagnosis not present

## 2022-11-17 DIAGNOSIS — Z951 Presence of aortocoronary bypass graft: Secondary | ICD-10-CM | POA: Insufficient documentation

## 2022-11-17 DIAGNOSIS — I5022 Chronic systolic (congestive) heart failure: Secondary | ICD-10-CM | POA: Diagnosis not present

## 2022-11-17 DIAGNOSIS — E1122 Type 2 diabetes mellitus with diabetic chronic kidney disease: Secondary | ICD-10-CM | POA: Diagnosis not present

## 2022-11-17 DIAGNOSIS — I130 Hypertensive heart and chronic kidney disease with heart failure and stage 1 through stage 4 chronic kidney disease, or unspecified chronic kidney disease: Secondary | ICD-10-CM | POA: Insufficient documentation

## 2022-11-17 DIAGNOSIS — I428 Other cardiomyopathies: Secondary | ICD-10-CM | POA: Insufficient documentation

## 2022-11-17 DIAGNOSIS — Z7902 Long term (current) use of antithrombotics/antiplatelets: Secondary | ICD-10-CM | POA: Insufficient documentation

## 2022-11-17 DIAGNOSIS — N183 Chronic kidney disease, stage 3 unspecified: Secondary | ICD-10-CM | POA: Insufficient documentation

## 2022-11-17 LAB — BASIC METABOLIC PANEL
Anion gap: 6 (ref 5–15)
BUN: 29 mg/dL — ABNORMAL HIGH (ref 8–23)
CO2: 24 mmol/L (ref 22–32)
Calcium: 9.1 mg/dL (ref 8.9–10.3)
Chloride: 104 mmol/L (ref 98–111)
Creatinine, Ser: 1.81 mg/dL — ABNORMAL HIGH (ref 0.61–1.24)
GFR, Estimated: 38 mL/min — ABNORMAL LOW (ref >60)
Glucose, Bld: 120 mg/dL — ABNORMAL HIGH (ref 70–99)
Potassium: 4.8 mmol/L (ref 3.5–5.1)
Sodium: 134 mmol/L — ABNORMAL LOW (ref 135–145)

## 2022-11-17 LAB — BRAIN NATRIURETIC PEPTIDE: B Natriuretic Peptide: 94.1 pg/mL (ref 0.0–100.0)

## 2022-11-17 MED ORDER — LOSARTAN POTASSIUM 25 MG PO TABS: 25.0000 mg | ORAL_TABLET | Freq: Every day | ORAL | 3 refills | Status: DC

## 2022-11-17 MED ORDER — METOPROLOL SUCCINATE ER 25 MG PO TB24: 25.0000 mg | ORAL_TABLET | Freq: Every day | ORAL | 6 refills | Status: DC

## 2022-11-17 NOTE — Patient Instructions (Addendum)
STOP Carvedilol   START Metoprolol 25mg  daily  Your provider requests you have an echocardiogram  Routine lab work today. Will notify you of abnormal results  Call [facility] cardiology group to get follow-up appointment scheduled as you haven't seen them since November 2022. Their number is [phone]  Follow up in 2 weeks  Do the following things EVERYDAY: Weigh yourself in the morning before breakfast. Write it down and keep it in a log. Take your medicines as prescribed Eat low salt foods--Limit salt (sodium) to 2000 mg per day.  Stay as active as you can everyday Limit all fluids for the day to less than 2 liters

## 2022-11-17 NOTE — Telephone Encounter (Signed)
-----   Message from Delma Freeze sent at 11/17/2022 10:10 AM EDT ----- Call contact Vicky:  Potassium is normal and kidney function is stable from 2 months ago. Begin losartan 25mg  once daily for the blood pressure and it will also help protect the kidneys & is good for your heart. We will recheck labs at your next visit.

## 2022-11-27 ENCOUNTER — Telehealth: Payer: Self-pay

## 2022-11-27 NOTE — Telephone Encounter (Addendum)
Benjamin Valentine (pt daughter) called to let [facility] [name], know that , pt was not feeling good with the metoprolol :per pt " not feeling right" ; and decided to go back to taking carvedilol 3.125 (1 tablet) 2 times  a day (starting today 11/27/22). Pt daughter stating pt feeling better with coreg. Denied SOB,CP or dizziness. Unable to describe what was pt  feeling and stated again "he said he didn't feel right". Cardilogist [facility] number given to establish f/u appt. F/u appt w Verdon Cummins [name] 12/06/22.

## 2022-11-29 DIAGNOSIS — I129 Hypertensive chronic kidney disease with stage 1 through stage 4 chronic kidney disease, or unspecified chronic kidney disease: Secondary | ICD-10-CM | POA: Diagnosis not present

## 2022-11-29 DIAGNOSIS — N1832 Chronic kidney disease, stage 3b: Secondary | ICD-10-CM | POA: Diagnosis not present

## 2022-11-29 DIAGNOSIS — E875 Hyperkalemia: Secondary | ICD-10-CM | POA: Insufficient documentation

## 2022-11-29 DIAGNOSIS — E1122 Type 2 diabetes mellitus with diabetic chronic kidney disease: Secondary | ICD-10-CM | POA: Diagnosis not present

## 2022-11-29 DIAGNOSIS — E785 Hyperlipidemia, unspecified: Secondary | ICD-10-CM | POA: Diagnosis not present

## 2022-11-30 ENCOUNTER — Ambulatory Visit: Payer: 59

## 2022-11-30 ENCOUNTER — Other Ambulatory Visit: Payer: Self-pay | Admitting: Nurse Practitioner

## 2022-11-30 VITALS — BP 128/62 | HR 85 | Ht 69.0 in | Wt 215.5 lb

## 2022-11-30 DIAGNOSIS — Z0000 Encounter for general adult medical examination without abnormal findings: Secondary | ICD-10-CM | POA: Diagnosis not present

## 2022-11-30 MED ORDER — ADULT BLOOD PRESSURE CUFF LG KIT: 1.0000 | PACK | 0 refills | Status: DC | PRN

## 2022-11-30 NOTE — Patient Instructions (Signed)
Mr. Coviello , Thank you for taking time to come for your Medicare Wellness Visit. I appreciate your ongoing commitment to your health goals. Please review the following plan we discussed and let me know if I can assist you in the future.   Referrals/Orders/Follow-Ups/Clinician Recommendations: none  This is a list of the screening recommended for you and due dates:  Health Maintenance  Topic Date Due   DTaP/Tdap/Td vaccine (1 - Tdap) Never done   Flu Shot  Never done   Pneumonia Vaccine (1 of 2 - PCV) 12/14/2022*   COVID-19 Vaccine (1) 12/14/2022*   Zoster (Shingles) Vaccine (1 of 2) 12/14/2022*   Medicare Annual Wellness Visit  11/30/2023   Hepatitis C Screening  Completed   HPV Vaccine  Aged Out  *Topic was postponed. The date shown is not the original due date.    Advanced directives: (Copy Requested) Please bring a copy of your health care power of attorney and living will to the office to be added to your chart at your convenience.  Next Medicare Annual Wellness Visit scheduled for next year: Yes 12/06/2023 @ 1pm in person

## 2022-11-30 NOTE — Progress Notes (Signed)
Subjective:   Benjamin Valentine is a 77 y.o. male who presents for Medicare Annual/Subsequent preventive examination.  Visit Complete: In person  Patient Medicare AWV questionnaire was completed by the patient on (not done); I have confirmed that all information answered by patient is correct and no changes since this date. Cardiac Risk Factors include: advanced age (>62men, >21 women);hypertension;male gender;dyslipidemia;sedentary lifestyle;obesity (BMI >30kg/m2)    Objective:    Today's Vitals   11/30/22 1305 11/30/22 1311  BP: 128/62   Pulse: 85   SpO2: 98%   Weight: 215 lb 8 oz (97.8 kg)   Height: 5\' 9"  (1.753 m)   PainSc:  2    Body mass index is 31.82 kg/m.     11/30/2022    1:22 PM 01/25/2022    7:33 AM 11/24/2021   12:46 PM 10/23/2021   11:48 AM 08/13/2021    9:53 AM 06/01/2021    9:44 AM 11/25/2020    9:15 PM  Advanced Directives  Does Patient Have a Medical Advance Directive? Yes No No No No No   Type of Estate agent of [place];Living will        Copy of Healthcare Power of Attorney in Chart? No - copy requested        Would patient like information on creating a medical advance directive?  No - Patient declined  No - Patient declined   No - Patient declined    Current Medications (verified) Outpatient Encounter Medications as of 11/30/2022  Medication Sig   allopurinol (ZYLOPRIM) 100 MG tablet Take 1 tablet (100 mg total) by mouth daily.   atorvastatin (LIPITOR) 40 MG tablet Take 1 tablet (40 mg total) by mouth daily.   Blood Pressure Monitoring (SPHYGMOMANOMETER) MISC 1 each by Does not apply route daily.   clopidogrel (PLAVIX) 75 MG tablet Take 1 tablet (75 mg total) by mouth daily.   colchicine 0.6 MG tablet Take 2 tabs p.o. x1 then take 1 tab p.o. 1 hour later for gout attack.   dapagliflozin propanediol (FARXIGA) 10 MG TABS tablet Take 1 tablet (10 mg total) by mouth daily.   furosemide (LASIX) 20 MG tablet Take 1 tablet (20 mg  total) by mouth daily as needed (As needed for weight gain of 2 pounds overnight).   hydrALAZINE (APRESOLINE) 50 MG tablet TAKE 1 TABLET BY MOUTH THREE TIMES A DAY   Iron, Ferrous Sulfate, 325 (65 Fe) MG TABS Take 325 mg by mouth daily.   isosorbide mononitrate (IMDUR) 30 MG 24 hr tablet Take 1 tablet (30 mg total) by mouth daily.   losartan (COZAAR) 25 MG tablet Take 1 tablet (25 mg total) by mouth daily.   metoprolol succinate (TOPROL XL) 25 MG 24 hr tablet Take 1 tablet (25 mg total) by mouth daily. (Patient not taking: Reported on 11/30/2022)   No facility-administered encounter medications on file as of 11/30/2022.    Allergies (verified) Entresto [sacubitril-valsartan]   History: Past Medical History:  Diagnosis Date   Arthritis    CHF (congestive heart failure) (HCC)    Chronic HFrEF (heart failure with reduced ejection fraction) (HCC)    a. 10/2020 Echo: EF 25-30%, nl RV fxn, mild MR.   CKD (chronic kidney disease), stage III (HCC)    Coronary artery disease    a. 2002 s/p CABG x 2: LIMA->LAD, VG->RPDA; b. 11/2020 Cath: LM 20ost, LAD 137m, LCX 99p/m, 76m/d, OM1 30, RCA 100p - fills via collats from LAD. RPL1 fills via collats from  LCX. VG->PDA 100, LIMA->LAD ok-->med rx.   Gout    Hyperlipidemia LDL goal <70    Hypertension    Ischemic cardiomyopathy    a. 10/2020 Echo: EF 25-30%.   Osteoarthritis    Past Surgical History:  Procedure Laterality Date   CARDIAC CATHETERIZATION     CARDIAC SURGERY     CABG 2002   IR ANGIO EXTRACRAN SEL COM CAROTID INNOMINATE UNI BILAT MOD SED  01/03/2022   IR ANGIO INTRA EXTRACRAN SEL INTERNAL CAROTID UNI R MOD SED  01/29/2022   IR ANGIO VERTEBRAL SEL VERTEBRAL UNI L MOD SED  01/03/2022   IR CT HEAD LTD  01/29/2022   IR INTRA CRAN STENT  01/25/2022   IR RADIOLOGIST EVAL & MGMT  12/21/2021   IR US GUIDE VASC ACCESS RIGHT  01/03/2022   IR US GUIDE VASC ACCESS RIGHT  01/25/2022   LAPAROSCOPIC APPENDECTOMY N/A 08/11/2018   Procedure:  APPENDECTOMY LAPAROSCOPIC;  Surgeon: Henrene Dodge, MD;  Location: [facility] ORS;  Service: General;  Laterality: N/A;   RADIOLOGY WITH ANESTHESIA N/A 01/25/2022   Procedure: Cerebral angioplasty with possible stenting;  Surgeon: Julieanne Cotton, MD;  Location: [facility] OR;  Service: Radiology;  Laterality: N/A;   RIGHT/LEFT HEART CATH AND CORONARY/GRAFT ANGIOGRAPHY N/A 11/15/2020   Procedure: RIGHT/LEFT HEART CATH AND CORONARY/GRAFT ANGIOGRAPHY;  Surgeon: Iran Ouch, MD;  Location: [facility];  Service: Cardiovascular;  Laterality: N/A;   No family history on file. Social History   Socioeconomic History   Marital status: Widowed    Spouse name: Not on file   Number of children: Not on file   Years of education: Not on file   Highest education level: Not on file  Occupational History   Not on file  Tobacco Use   Smoking status: Former   Smokeless tobacco: Never  Vaping Use   Vaping status: Never Used  Substance and Sexual Activity   Alcohol use: No   Drug use: No   Sexual activity: Not on file  Other Topics Concern   Not on file  Social History Narrative   Not on file   Social Determinants of Health   Financial Resource Strain: Low Risk  (11/30/2022)   Overall Financial Resource Strain (CARDIA)    Difficulty of Paying Living Expenses: Not very hard  Food Insecurity: No Food Insecurity (11/30/2022)   Hunger Vital Sign    Worried About Running Out of Food in the Last Year: Never true    Ran Out of Food in the Last Year: Never true  Transportation Needs: No Transportation Needs (11/30/2022)   PRAPARE - Administrator, Civil Service (Medical): No    Lack of Transportation (Non-Medical): No  Physical Activity: Inactive (11/30/2022)   Exercise Vital Sign    Days of Exercise per Week: 0 days    Minutes of Exercise per Session: 0 min  Stress: No Stress Concern Present (11/30/2022)   [employer] of Occupational Health - Occupational Stress  Questionnaire    Feeling of Stress : Not at all  Social Connections: Socially Isolated (11/30/2022)   Social Connection and Isolation Panel [NHANES]    Frequency of Communication with Friends and Family: More than three times a week    Frequency of Social Gatherings with Friends and Family: More than three times a week    Attends Religious Services: Never    Database administrator or Organizations: No    Attends Banker Meetings: Never  Marital Status: Widowed    Tobacco Counseling Counseling given: Not Answered   Clinical Intake:  Pre-visit preparation completed: No  Pain : 0-10 Pain Score: 2  Pain Type: Acute pain Pain Location: Head Pain Orientation: Left Pain Descriptors / Indicators: Tingling Pain Onset: Yesterday Pain Frequency: Intermittent     BMI - recorded: 31.82 Nutritional Status: BMI > 30  Obese Nutritional Risks: None Diabetes: No  How often do you need to have someone help you when you read instructions, pamphlets, or other written materials from your doctor or pharmacy?: 1 - Never  Interpreter Needed?: No  Comments: lives with son Information entered by :: B.Kaesha Kirsch,LPN   Activities of Daily Living    11/30/2022    1:22 PM 09/22/2022    9:28 AM  In your present state of health, do you have any difficulty performing the following activities:  Hearing? 0 0  Vision? 0 0  Difficulty concentrating or making decisions? 0 0  Walking or climbing stairs? 0 0  Dressing or bathing? 0 0  Doing errands, shopping? 0 0  Preparing Food and eating ? N   Using the Toilet? N   In the past six months, have you accidently leaked urine? N   Do you have problems with loss of bowel control? N   Managing your Medications? N   Managing your Finances? N   Housekeeping or managing your Housekeeping? N     Patient Care Team: Berniece Salines, [name] as PCP - General (Nurse Practitioner) Antonieta Iba, MD as PCP - Cardiology (Cardiology) Delma Freeze, [name] (Family Medicine)  Indicate any recent Medical Services you may have received from other than [facility] providers in the past year (date may be approximate).     Assessment:   This is a routine wellness examination for Deegan.  Hearing/Vision screen Hearing Screening - Comments:: Pt says no problems with hearing Vision Screening - Comments:: Pt says his vision is good;no eye dr in years :declines referral for Eye Exam   Goals Addressed   None    Depression Screen    11/30/2022    1:19 PM 09/22/2022    9:28 AM 11/15/2021   12:44 PM 03/30/2021    9:15 AM 03/22/2021    1:42 PM 12/02/2020    1:15 PM 11/25/2020    2:56 PM  PHQ 2/9 Scores  PHQ - 2 Score 0 0 0 0 0 0 0    Fall Risk    11/30/2022    1:14 PM 09/22/2022    9:28 AM 11/15/2021   12:44 PM 09/01/2021   11:46 AM 06/02/2021    2:36 PM  Fall Risk   Falls in the past year? 0 0 0 0 0  Number falls in past yr: 0 0 0 0 0  Injury with Fall? 0 0 0 0 0  Risk for fall due to : No Fall Risks  No Fall Risks    Follow up Education provided;Falls prevention discussed  Falls evaluation completed;Falls prevention discussed Falls evaluation completed Falls evaluation completed    MEDICARE RISK AT HOME: Medicare Risk at Home Any stairs in or around the home?: No If so, are there any without handrails?: No Home free of loose throw rugs in walkways, pet beds, electrical cords, etc?: Yes Adequate lighting in your home to reduce risk of falls?: Yes Life alert?: No Use of a cane, walker or w/c?: No Grab bars in the bathroom?: No Shower chair or bench in shower?: No Elevated  toilet seat or a handicapped toilet?: No  TIMED UP AND GO:  Was the test performed?  Yes  Length of time to ambulate 10 feet: 15 sec Gait slow and steady without use of assistive device    Cognitive Function:    11/25/2020    2:57 PM  MMSE - Mini Mental State Exam  Not completed: Unable to complete        11/30/2022    1:23 PM 11/25/2020    2:57 PM   6CIT Screen  What Year? 0 points 0 points  What month? 0 points 0 points  What time? 0 points 0 points  Count back from 20 0 points 0 points  Months in reverse 4 points 0 points  Repeat phrase 8 points 0 points  Total Score 12 points 0 points    Immunizations  There is no immunization history on file for this patient.  TDAP status: Up to date  Flu Vaccine status: Declined, Education has been provided regarding the importance of this vaccine but patient still declined. Advised may receive this vaccine at local pharmacy or Health Dept. Aware to provide a copy of the vaccination record if obtained from local pharmacy or Health Dept. Verbalized acceptance and understanding.  Pneumococcal vaccine status: Declined,  Education has been provided regarding the importance of this vaccine but patient still declined. Advised may receive this vaccine at local pharmacy or Health Dept. Aware to provide a copy of the vaccination record if obtained from local pharmacy or Health Dept. Verbalized acceptance and understanding.   Covid-19 vaccine status: Declined, Education has been provided regarding the importance of this vaccine but patient still declined. Advised may receive this vaccine at local pharmacy or Health Dept.or vaccine clinic. Aware to provide a copy of the vaccination record if obtained from local pharmacy or Health Dept. Verbalized acceptance and understanding.  Qualifies for Shingles Vaccine? Yes   Zostavax completed No   Shingrix Completed?: No.    Education has been provided regarding the importance of this vaccine. Patient has been advised to call insurance company to determine out of pocket expense if they have not yet received this vaccine. Advised may also receive vaccine at local pharmacy or Health Dept. Verbalized acceptance and understanding.  Screening Tests Health Maintenance  Topic Date Due   Pneumonia Vaccine 39+ Years old (1 of 2 - PCV) 12/14/2022 (Originally 06/14/1951)    COVID-19 Vaccine (1) 12/14/2022 (Originally 06/13/1950)   Zoster Vaccines- Shingrix (1 of 2) 12/14/2022 (Originally 06/14/1964)   INFLUENZA VACCINE  05/14/2023 (Originally 09/14/2022)   DTaP/Tdap/Td (1 - Tdap) 11/30/2023 (Originally 06/14/1964)   Medicare Annual Wellness (AWV)  11/30/2023   Hepatitis C Screening  Completed   HPV VACCINES  Aged Out    Health Maintenance  There are no preventive care reminders to display for this patient.   Colorectal cancer screening: No longer required.   Lung Cancer Screening: (Low Dose CT Chest recommended if Age 69-80 years, 20 pack-year currently smoking OR have quit w/in 15years.) does not qualify.   Lung Cancer Screening Referral: no  Additional Screening:  Hepatitis C Screening: does not qualify; Completed 09/25/22  Vision Screening: Recommended annual ophthalmology exams for early detection of glaucoma and other disorders of the eye. Is the patient up to date with their annual eye exam?  No  Who is the provider or what is the name of the office in which the patient attends annual eye exams? none If pt is not established with a provider, would they  like to be referred to a provider to establish care? No .   Dental Screening: Recommended annual dental exams for proper oral hygiene  Diabetic Foot Exam: n/a  Community Resource Referral / Chronic Care Management: CRR required this visit?  No   CCM required this visit?  No    Plan:     I have personally reviewed and noted the following in the patient's chart:   Medical and social history Use of alcohol, tobacco or illicit drugs  Current medications and supplements including opioid prescriptions. Patient is not currently taking opioid prescriptions. Functional ability and status Nutritional status Physical activity Advanced directives List of other physicians Hospitalizations, surgeries, and ER visits in previous 12 months Vitals Screenings to include cognitive, depression, and  falls Referrals and appointments  In addition, I have reviewed and discussed with patient certain preventive protocols, quality metrics, and best practice recommendations. A written personalized care plan for preventive services as well as general preventive health recommendations were provided to patient.     Sue Lush, LPN   16/11/9602   After Visit Summary: (MyChart) Due to this being a telephonic visit, the after visit summary with patients personalized plan was offered to patient via MyChart   Nurse Notes: Pt in with daughter to visit. He relays he had an episode of SOB yesterday but says he "got alright". SpO2 @ 98%. VSS. He says he has tingling in his hand and on left side of temporal. He relays his energy level is low and says the medication "got him off" as he experienced a few dizzy spells while taking Metoprolol.Marland Kitchen He and daughter relay they stopped the Metoprolol and re-started Carvedilol he was taking prior to prescribing Metoprolol. She relays she called pt cardiologist and made aware.  **Pt/daughter says they need a BP cuff. Can PCP send order to pharmacy.

## 2022-12-06 ENCOUNTER — Ambulatory Visit: Payer: 59 | Attending: Family | Admitting: Family

## 2022-12-06 ENCOUNTER — Encounter: Payer: Self-pay | Admitting: Family

## 2022-12-06 ENCOUNTER — Other Ambulatory Visit: Payer: Self-pay | Admitting: Cardiology

## 2022-12-06 ENCOUNTER — Ambulatory Visit (HOSPITAL_COMMUNITY)
Admission: RE | Admit: 2022-12-06 | Discharge: 2022-12-06 | Disposition: A | Discharge status: Home / Self Care | Payer: 59 | Source: Ambulatory Visit | Attending: Cardiology | Admitting: Cardiology

## 2022-12-06 VITALS — BP 110/56 | HR 87 | Wt 214.0 lb

## 2022-12-06 DIAGNOSIS — I252 Old myocardial infarction: Secondary | ICD-10-CM | POA: Diagnosis not present

## 2022-12-06 DIAGNOSIS — Z8673 Personal history of transient ischemic attack (TIA), and cerebral infarction without residual deficits: Secondary | ICD-10-CM | POA: Diagnosis not present

## 2022-12-06 DIAGNOSIS — D500 Iron deficiency anemia secondary to blood loss (chronic): Secondary | ICD-10-CM | POA: Diagnosis not present

## 2022-12-06 DIAGNOSIS — K921 Melena: Secondary | ICD-10-CM | POA: Insufficient documentation

## 2022-12-06 DIAGNOSIS — E1122 Type 2 diabetes mellitus with diabetic chronic kidney disease: Secondary | ICD-10-CM | POA: Diagnosis not present

## 2022-12-06 DIAGNOSIS — I5020 Unspecified systolic (congestive) heart failure: Secondary | ICD-10-CM | POA: Diagnosis not present

## 2022-12-06 DIAGNOSIS — I5022 Chronic systolic (congestive) heart failure: Secondary | ICD-10-CM | POA: Diagnosis not present

## 2022-12-06 DIAGNOSIS — Z7902 Long term (current) use of antithrombotics/antiplatelets: Secondary | ICD-10-CM | POA: Diagnosis not present

## 2022-12-06 DIAGNOSIS — Z951 Presence of aortocoronary bypass graft: Secondary | ICD-10-CM | POA: Insufficient documentation

## 2022-12-06 DIAGNOSIS — I071 Rheumatic tricuspid insufficiency: Secondary | ICD-10-CM | POA: Diagnosis not present

## 2022-12-06 DIAGNOSIS — E785 Hyperlipidemia, unspecified: Secondary | ICD-10-CM | POA: Insufficient documentation

## 2022-12-06 DIAGNOSIS — I255 Ischemic cardiomyopathy: Secondary | ICD-10-CM | POA: Diagnosis not present

## 2022-12-06 DIAGNOSIS — R932 Abnormal findings on diagnostic imaging of liver and biliary tract: Secondary | ICD-10-CM | POA: Diagnosis not present

## 2022-12-06 DIAGNOSIS — D649 Anemia, unspecified: Secondary | ICD-10-CM | POA: Diagnosis not present

## 2022-12-06 DIAGNOSIS — N183 Chronic kidney disease, stage 3 unspecified: Secondary | ICD-10-CM | POA: Insufficient documentation

## 2022-12-06 DIAGNOSIS — R002 Palpitations: Secondary | ICD-10-CM

## 2022-12-06 DIAGNOSIS — M109 Gout, unspecified: Secondary | ICD-10-CM | POA: Diagnosis not present

## 2022-12-06 DIAGNOSIS — R160 Hepatomegaly, not elsewhere classified: Secondary | ICD-10-CM | POA: Diagnosis not present

## 2022-12-06 DIAGNOSIS — I428 Other cardiomyopathies: Secondary | ICD-10-CM | POA: Diagnosis not present

## 2022-12-06 DIAGNOSIS — K922 Gastrointestinal hemorrhage, unspecified: Secondary | ICD-10-CM | POA: Diagnosis not present

## 2022-12-06 DIAGNOSIS — I639 Cerebral infarction, unspecified: Secondary | ICD-10-CM | POA: Diagnosis not present

## 2022-12-06 DIAGNOSIS — I10 Essential (primary) hypertension: Secondary | ICD-10-CM | POA: Diagnosis not present

## 2022-12-06 DIAGNOSIS — I5023 Acute on chronic systolic (congestive) heart failure: Secondary | ICD-10-CM | POA: Diagnosis not present

## 2022-12-06 DIAGNOSIS — I6521 Occlusion and stenosis of right carotid artery: Secondary | ICD-10-CM | POA: Insufficient documentation

## 2022-12-06 DIAGNOSIS — Z87891 Personal history of nicotine dependence: Secondary | ICD-10-CM | POA: Diagnosis not present

## 2022-12-06 DIAGNOSIS — R0789 Other chest pain: Secondary | ICD-10-CM | POA: Diagnosis not present

## 2022-12-06 DIAGNOSIS — D519 Vitamin B12 deficiency anemia, unspecified: Secondary | ICD-10-CM | POA: Diagnosis not present

## 2022-12-06 DIAGNOSIS — N1832 Chronic kidney disease, stage 3b: Secondary | ICD-10-CM | POA: Diagnosis not present

## 2022-12-06 DIAGNOSIS — D62 Acute posthemorrhagic anemia: Secondary | ICD-10-CM | POA: Diagnosis not present

## 2022-12-06 DIAGNOSIS — E538 Deficiency of other specified B group vitamins: Secondary | ICD-10-CM | POA: Diagnosis not present

## 2022-12-06 DIAGNOSIS — I351 Nonrheumatic aortic (valve) insufficiency: Secondary | ICD-10-CM | POA: Diagnosis not present

## 2022-12-06 DIAGNOSIS — I130 Hypertensive heart and chronic kidney disease with heart failure and stage 1 through stage 4 chronic kidney disease, or unspecified chronic kidney disease: Secondary | ICD-10-CM | POA: Insufficient documentation

## 2022-12-06 DIAGNOSIS — R0600 Dyspnea, unspecified: Secondary | ICD-10-CM | POA: Diagnosis not present

## 2022-12-06 DIAGNOSIS — I2510 Atherosclerotic heart disease of native coronary artery without angina pectoris: Secondary | ICD-10-CM | POA: Insufficient documentation

## 2022-12-06 DIAGNOSIS — I340 Nonrheumatic mitral (valve) insufficiency: Secondary | ICD-10-CM | POA: Diagnosis not present

## 2022-12-06 DIAGNOSIS — K5900 Constipation, unspecified: Secondary | ICD-10-CM | POA: Diagnosis not present

## 2022-12-06 DIAGNOSIS — M1990 Unspecified osteoarthritis, unspecified site: Secondary | ICD-10-CM | POA: Diagnosis not present

## 2022-12-06 DIAGNOSIS — Z888 Allergy status to other drugs, medicaments and biological substances status: Secondary | ICD-10-CM | POA: Diagnosis not present

## 2022-12-06 DIAGNOSIS — K7689 Other specified diseases of liver: Secondary | ICD-10-CM | POA: Diagnosis not present

## 2022-12-06 DIAGNOSIS — I129 Hypertensive chronic kidney disease with stage 1 through stage 4 chronic kidney disease, or unspecified chronic kidney disease: Secondary | ICD-10-CM | POA: Diagnosis not present

## 2022-12-06 DIAGNOSIS — R079 Chest pain, unspecified: Secondary | ICD-10-CM | POA: Diagnosis not present

## 2022-12-06 NOTE — Progress Notes (Signed)
PCP: Della Goo, [name] (last seen 08/24) Primary Cardiologist: Benjamin Nordmann, MD (last seen 11/22)  HPI:  Benjamin Valentine is a 77 y/o male with a history of CAD (CABG 2022), HTN, hyperlipidemia, CKD, arthritis, gout, stroke (10/23), carotid stenosis (angiogram 12/23), previous tobacco use and chronic heart failure.   Admitted 11/11/2020 for elevated tropinin. He presented to the hospital with increasing SOB along with ABD distention, and orthopena. Initial HStrop 59, EKG showed T wave inversion in the inferolateral leads. Echo showed decreased LV function with EF 25-30%, mid-mod LV dilation, mild Benjamin. Underwent R/L HC which showed significant 3-vessel disease with patent LIMA to LAD with occluded SVG to RCA and SVG to OM. RHC showed high normal filling pressure, moderate pulmonary hypertension, and mod to severe reduced CO. He was diuresed with lasix but had increase creatinine above baseline. Admitted back to the hospital on 11/25/20 after presenting with new onset left arm and left knee numbness and weakness. Stroke workup was performed and was negative. He was seen by neurology and was thought symptoms were due to TIA.   Was in the ED 06/01/21 due to dizziness, headache and chest pain. Accidentally took a double dose of his medications today. Symptoms improved and he was released. Was in the ED 08/13/21 due to left wrist gout where he was evaluated and released. Was in the ED 10/23/21 due to left wrist gout where he was treated and released.   Admitted 11/24/21 due to slurred speech and facial droop. Code stroke initiated on arrival. Low NIH, not a candidate for TNK. Discussed with neurology who recommends hospitalization for further stroke work-up. MRI is positive for acute infarct. CTA H&N showed severe multifocal stenosis. Held coreg, Entresto, Lasix, hydralazine and Imdur for permissive HTN, to be resumed after discharge. 01/25/22 had cerebral angiogram with intervention for RICA stenosis.   Echo 11/11/20:  EF 25-30% along with mild Benjamin.   RHC/LHC done 11/15/20 and showed: Ost LM lesion is 20% stenosed.   Prox Cx to Mid Cx lesion is 99% stenosed.   Mid Cx to Dist Cx lesion is 99% stenosed.   1st Mrg lesion is 30% stenosed.   Mid LAD lesion is 100% stenosed.   Prox RCA lesion is 100% stenosed.   Origin lesion is 100% stenosed.   SVG.   LIMA graft was visualized by angiography and is normal in caliber.   The graft exhibits no disease. 1.  Significant underlying three-vessel coronary artery disease with occluded mid LAD, occluded proximal right coronary artery and subtotal occlusion of the mid left circumflex after the origin of a large high OM1 which supplies the majority of the left circumflex distribution. Patent LIMA to LAD with occluded SVG to RCA and SVG to OM. The RCA gets left-to-right collaterals. 2.  Left ventricular angiography was not performed due to chronic kidney disease. 3.  Right heart catheterization showed high normal filling pressures, moderate pulmonary hypertension and moderately to severely reduced cardiac output.  He presents today for a follow-up visit with a chief complaint of moderate shortness of breath with minimal exertion. Chronic in nature although he feels like it's worsening. He has associated fatigue, dizziness, intermittent chest pain/ palpitations and left hand feels sore/ tight. He says that the palpitations are worsening and he gets dizzy upon standing although it's unclear of the frequency that this is occurring.   At last visit his beta blocker was changed from carvedilol to once daily metoprolol for compliance. He called back ~ 10 days later and  said that he hadn't been "feeling right" since starting the metoprolol so he went back to carvedilol and has been feeling better. Still doesn't take the PM dose of carvedilol. Taking hydralazine BID as he takes 1 dose in the morning ~ 6am and the second dose early afternoon.   Not taking daily furosemide because he is  already urinating "all the time"  ROS: All systems negative except as listed in HPI, PMH and Problem List.  SH:  Social History   Socioeconomic History   Marital status: Widowed    Spouse name: Not on file   Number of children: Not on file   Years of education: Not on file   Highest education level: Not on file  Occupational History   Not on file  Tobacco Use   Smoking status: Former   Smokeless tobacco: Never  Vaping Use   Vaping status: Never Used  Substance and Sexual Activity   Alcohol use: No   Drug use: No   Sexual activity: Not on file  Other Topics Concern   Not on file  Social History Narrative   Not on file   Social Determinants of Health   Financial Resource Strain: Low Risk  (11/30/2022)   Overall Financial Resource Strain (CARDIA)    Difficulty of Paying Living Expenses: Not very hard  Food Insecurity: No Food Insecurity (11/30/2022)   Hunger Vital Sign    Worried About Running Out of Food in the Last Year: Never true    Ran Out of Food in the Last Year: Never true  Transportation Needs: No Transportation Needs (11/30/2022)   PRAPARE - Administrator, Civil Service (Medical): No    Lack of Transportation (Non-Medical): No  Physical Activity: Inactive (11/30/2022)   Exercise Vital Sign    Days of Exercise per Week: 0 days    Minutes of Exercise per Session: 0 min  Stress: No Stress Concern Present (11/30/2022)   [employer] of Occupational Health - Occupational Stress Questionnaire    Feeling of Stress : Not at all  Social Connections: Socially Isolated (11/30/2022)   Social Connection and Isolation Panel [NHANES]    Frequency of Communication with Friends and Family: More than three times a week    Frequency of Social Gatherings with Friends and Family: More than three times a week    Attends Religious Services: Never    Database administrator or Organizations: No    Attends Banker Meetings: Never    Marital Status:  Widowed  Intimate Partner Violence: Not At Risk (11/30/2022)   Humiliation, Afraid, Rape, and Kick questionnaire    Fear of Current or Ex-Partner: No    Emotionally Abused: No    Physically Abused: No    Sexually Abused: No    FH: No family history on file.  Past Medical History:  Diagnosis Date   Arthritis    CHF (congestive heart failure) (HCC)    Chronic HFrEF (heart failure with reduced ejection fraction) (HCC)    a. 10/2020 Echo: EF 25-30%, nl RV fxn, mild Benjamin.   CKD (chronic kidney disease), stage III (HCC)    Coronary artery disease    a. 2002 s/p CABG x 2: LIMA->LAD, VG->RPDA; b. 11/2020 Cath: LM 20ost, LAD 163m, LCX 99p/m, 60m/d, OM1 30, RCA 100p - fills via collats from LAD. RPL1 fills via collats from LCX. VG->PDA 100, LIMA->LAD ok-->med rx.   Gout    Hyperlipidemia LDL goal <70    Hypertension  Ischemic cardiomyopathy    a. 10/2020 Echo: EF 25-30%.   Osteoarthritis     Current Outpatient Medications  Medication Sig Dispense Refill   allopurinol (ZYLOPRIM) 100 MG tablet Take 1 tablet (100 mg total) by mouth daily. 90 tablet 3   atorvastatin (LIPITOR) 40 MG tablet Take 1 tablet (40 mg total) by mouth daily. 90 tablet 2   Blood Pressure Monitoring (ADULT BLOOD PRESSURE CUFF LG) KIT 1 each by Does not apply route as needed (to check blood pressure). 1 kit 0   Blood Pressure Monitoring (SPHYGMOMANOMETER) MISC 1 each by Does not apply route daily. 1 each 0   clopidogrel (PLAVIX) 75 MG tablet Take 1 tablet (75 mg total) by mouth daily. 90 tablet 1   colchicine 0.6 MG tablet Take 2 tabs p.o. x1 then take 1 tab p.o. 1 hour later for gout attack. 20 tablet 0   dapagliflozin propanediol (FARXIGA) 10 MG TABS tablet Take 1 tablet (10 mg total) by mouth daily. 90 tablet 3   furosemide (LASIX) 20 MG tablet Take 1 tablet (20 mg total) by mouth daily as needed (As needed for weight gain of 2 pounds overnight). 90 tablet 3   hydrALAZINE (APRESOLINE) 50 MG tablet TAKE 1 TABLET BY MOUTH  THREE TIMES A DAY 270 tablet 2   Iron, Ferrous Sulfate, 325 (65 Fe) MG TABS Take 325 mg by mouth daily. 90 tablet 1   isosorbide mononitrate (IMDUR) 30 MG 24 hr tablet Take 1 tablet (30 mg total) by mouth daily. 90 tablet 3   losartan (COZAAR) 25 MG tablet Take 1 tablet (25 mg total) by mouth daily. 90 tablet 3   metoprolol succinate (TOPROL XL) 25 MG 24 hr tablet Take 1 tablet (25 mg total) by mouth daily. (Patient not taking: Reported on 11/30/2022) 30 tablet 6   No current facility-administered medications for this visit.   Vitals:   12/06/22 0849  BP: (!) 110/56  Pulse: 87  SpO2: 100%  Weight: 214 lb (97.1 kg)   Wt Readings from Last 3 Encounters:  12/06/22 214 lb (97.1 kg)  11/30/22 215 lb 8 oz (97.8 kg)  11/17/22 213 lb 4 oz (96.7 kg)   Lab Results  Component Value Date   CREATININE 1.81 (H) 11/17/2022   CREATININE 1.79 (H) 09/22/2022   CREATININE 1.53 (H) 01/26/2022   PHYSICAL EXAM:  General:  Well appearing. No resp difficulty HEENT: normal Neck: supple. JVP flat. No lymphadenopathy or thryomegaly appreciated. Cor: PMI normal. Regular rate & rhythm. No rubs, gallops or murmurs. Lungs: clear Abdomen: soft, nontender, nondistended. No hepatosplenomegaly. No bruits or masses.  Extremities: no cyanosis, clubbing, rash, edema Neuro: alert & orientedx3, cranial nerves grossly intact. Moves all 4 extremities w/o difficulty. Affect pleasant.   ECG: not done  ReDs: 33%   ASSESSMENT & PLAN:  1: NICM with reduced ejection fraction- - suspect due to HTN - NYHA class III - euvolemic today - not weighing daily but does have scales; encouraged to resume weighing daily and to call for an overnight weight gain of > 2 pounds or a weekly weight gain of > 5 pounds - weight unchanged from last visit here 3 weeks ago - ReDs: 33% - Echo 11/11/20: EF 25-30% along with mild Benjamin.  - echo has been scheduled for 01/03/23 - question whether he needs cMRI or possible repeat cath - not  adding salt and friend ("daughter") does most of the cooking and doesn't cook with salt either - continue carvedilol 3.125mg  BID (  unable to tolerate metoprolol); emphasized that it has to be taken BID even if the doses are taken 6 hours apart  - continue farxiga 10mg  daily - continue furosemide 20mg  PRN - continue hydralazine 50mg  BID/ isosorbide MN 30mg  daily - continue losartan 25mg  daily - unable to tolerate entresto - drinking one 16 oz soda and 2 bottles of water daily; instructed him to increase his water intake to 3 bottles daily - BNP 11/11/20 was 616.5  2: HTN with CKD- - BP 110/56 - saw PCP Benjamin Valentine) 08/24 - BMP 11/17/22 reviewed and showed sodium 134, potassium 4.8, creatinine 1.81 and GFR 38 - saw nephrology (Benjamin Valentine) 10/24  3: CAD- - CABG back in 2002 - saw cardiology Benjamin Valentine) 11/22; has f/u scheduled 12/24 - continue atorvastatin 40mg  daily - continue plavix 75mg  daily - LDL 09/22/22 was 78 - RHC/LHC done 11/15/20 and showed: Ost LM lesion is 20% stenosed.   Prox Cx to Mid Cx lesion is 99% stenosed.   Mid Cx to Dist Cx lesion is 99% stenosed.   1st Mrg lesion is 30% stenosed.   Mid LAD lesion is 100% stenosed.   Prox RCA lesion is 100% stenosed.   Origin lesion is 100% stenosed.   SVG.   LIMA graft was visualized by angiography and is normal in caliber.   The graft exhibits no disease. 1.  Significant underlying three-vessel coronary artery disease with occluded mid LAD, occluded proximal right coronary artery and subtotal occlusion of the mid left circumflex after the origin of a large high OM1 which supplies the majority of the left circumflex distribution. Patent LIMA to LAD with occluded SVG to RCA and SVG to OM. The RCA gets left-to-right collaterals. 2.  Left ventricular angiography was not performed due to chronic kidney disease. 3.  Right heart catheterization showed high normal filling pressures, moderate pulmonary hypertension and moderately to severely reduced  cardiac output.  4: Stroke- - 01/25/22 had cerebral angiogram with intervention for RICA stenosis - did not f/u with Dr. Corliss Skains, interventional radiology (PCP gave him contact #) - LDL 09/22/22 was 78 - continue atorvastatin 40mg  daily  5: Diabetes- - A1c 09/22/22 was 7.0%  6: Palpitations- - 14 day zio monitor placed today to rule out arrhythmias   Return in 3 weeks, sooner if needed.

## 2022-12-06 NOTE — Patient Instructions (Addendum)
Have your echocardiogram completed. You will check in at the [facility] for this 15 MINS EARLY.   Drink 3 bottles of water every day with your 1 soda.    Take carvedilol twice daily   Take your Monitor off November 5th and put it back in the box, seal it up and mail it back.  We will call you with your results once we have them.

## 2022-12-07 ENCOUNTER — Telehealth (HOSPITAL_COMMUNITY): Payer: Self-pay

## 2022-12-07 NOTE — Telephone Encounter (Signed)
Called to schedule f/u, no answer, no vm. AB

## 2022-12-11 NOTE — Addendum Note (Signed)
Encounter addended by: Crissie Figures, RN on: 12/11/2022 2:02 PM  Actions taken: Imaging Exam begun

## 2022-12-21 DIAGNOSIS — N1832 Chronic kidney disease, stage 3b: Secondary | ICD-10-CM | POA: Diagnosis not present

## 2022-12-21 DIAGNOSIS — E1122 Type 2 diabetes mellitus with diabetic chronic kidney disease: Secondary | ICD-10-CM | POA: Diagnosis not present

## 2022-12-21 DIAGNOSIS — E785 Hyperlipidemia, unspecified: Secondary | ICD-10-CM | POA: Diagnosis not present

## 2022-12-21 DIAGNOSIS — I129 Hypertensive chronic kidney disease with stage 1 through stage 4 chronic kidney disease, or unspecified chronic kidney disease: Secondary | ICD-10-CM | POA: Diagnosis not present

## 2022-12-21 DIAGNOSIS — E875 Hyperkalemia: Secondary | ICD-10-CM | POA: Diagnosis not present

## 2022-12-27 DIAGNOSIS — E1122 Type 2 diabetes mellitus with diabetic chronic kidney disease: Secondary | ICD-10-CM | POA: Diagnosis not present

## 2022-12-27 DIAGNOSIS — E875 Hyperkalemia: Secondary | ICD-10-CM | POA: Diagnosis not present

## 2022-12-27 DIAGNOSIS — E785 Hyperlipidemia, unspecified: Secondary | ICD-10-CM | POA: Diagnosis not present

## 2022-12-27 DIAGNOSIS — I129 Hypertensive chronic kidney disease with stage 1 through stage 4 chronic kidney disease, or unspecified chronic kidney disease: Secondary | ICD-10-CM | POA: Diagnosis not present

## 2022-12-27 DIAGNOSIS — N1832 Chronic kidney disease, stage 3b: Secondary | ICD-10-CM | POA: Diagnosis not present

## 2022-12-27 DIAGNOSIS — R002 Palpitations: Secondary | ICD-10-CM | POA: Diagnosis not present

## 2022-12-27 DIAGNOSIS — D631 Anemia in chronic kidney disease: Secondary | ICD-10-CM | POA: Insufficient documentation

## 2022-12-28 NOTE — Addendum Note (Signed)
Encounter addended by: Crissie Figures, RN on: 12/28/2022 1:15 PM  Actions taken: Imaging Exam ended

## 2023-01-02 ENCOUNTER — Telehealth: Payer: Self-pay

## 2023-01-02 NOTE — Telephone Encounter (Signed)
Called and confirmed appt

## 2023-01-03 ENCOUNTER — Ambulatory Visit
Admission: RE | Admit: 2023-01-03 | Discharge: 2023-01-03 | Disposition: A | Discharge status: Home / Self Care | Payer: 59 | Source: Ambulatory Visit | Attending: Family | Admitting: Family

## 2023-01-03 ENCOUNTER — Ambulatory Visit (HOSPITAL_BASED_OUTPATIENT_CLINIC_OR_DEPARTMENT_OTHER): Payer: 59 | Admitting: Family

## 2023-01-03 ENCOUNTER — Encounter: Payer: Self-pay | Admitting: Family

## 2023-01-03 ENCOUNTER — Other Ambulatory Visit: Payer: Self-pay | Admitting: Nurse Practitioner

## 2023-01-03 VITALS — BP 122/63 | HR 64 | Ht 69.0 in | Wt 213.0 lb

## 2023-01-03 DIAGNOSIS — I130 Hypertensive heart and chronic kidney disease with heart failure and stage 1 through stage 4 chronic kidney disease, or unspecified chronic kidney disease: Secondary | ICD-10-CM | POA: Diagnosis not present

## 2023-01-03 DIAGNOSIS — I5022 Chronic systolic (congestive) heart failure: Secondary | ICD-10-CM

## 2023-01-03 DIAGNOSIS — Z951 Presence of aortocoronary bypass graft: Secondary | ICD-10-CM | POA: Diagnosis not present

## 2023-01-03 DIAGNOSIS — D649 Anemia, unspecified: Secondary | ICD-10-CM | POA: Diagnosis not present

## 2023-01-03 DIAGNOSIS — Z79899 Other long term (current) drug therapy: Secondary | ICD-10-CM | POA: Insufficient documentation

## 2023-01-03 DIAGNOSIS — D631 Anemia in chronic kidney disease: Secondary | ICD-10-CM | POA: Insufficient documentation

## 2023-01-03 DIAGNOSIS — I2510 Atherosclerotic heart disease of native coronary artery without angina pectoris: Secondary | ICD-10-CM

## 2023-01-03 DIAGNOSIS — I428 Other cardiomyopathies: Secondary | ICD-10-CM | POA: Insufficient documentation

## 2023-01-03 DIAGNOSIS — I10 Essential (primary) hypertension: Secondary | ICD-10-CM | POA: Diagnosis not present

## 2023-01-03 DIAGNOSIS — E1122 Type 2 diabetes mellitus with diabetic chronic kidney disease: Secondary | ICD-10-CM

## 2023-01-03 DIAGNOSIS — Z8673 Personal history of transient ischemic attack (TIA), and cerebral infarction without residual deficits: Secondary | ICD-10-CM | POA: Diagnosis not present

## 2023-01-03 DIAGNOSIS — Z87891 Personal history of nicotine dependence: Secondary | ICD-10-CM | POA: Insufficient documentation

## 2023-01-03 DIAGNOSIS — N183 Chronic kidney disease, stage 3 unspecified: Secondary | ICD-10-CM | POA: Diagnosis not present

## 2023-01-03 DIAGNOSIS — I639 Cerebral infarction, unspecified: Secondary | ICD-10-CM | POA: Diagnosis not present

## 2023-01-03 DIAGNOSIS — N1832 Chronic kidney disease, stage 3b: Secondary | ICD-10-CM

## 2023-01-03 LAB — ECHOCARDIOGRAM COMPLETE
AR max vel: 4.85 cm2
AV Area VTI: 4.28 cm2
AV Area mean vel: 4.31 cm2
AV Mean grad: 2 mm[Hg]
AV Peak grad: 2.7 mm[Hg]
Ao pk vel: 0.82 m/s
Area-P 1/2: 3.89 cm2
Calc EF: 30 %
MV VTI: 3.34 cm2
Single Plane A2C EF: 20.3 %
Single Plane A4C EF: 37.5 %

## 2023-01-03 MED ORDER — ADULT BLOOD PRESSURE CUFF LG KIT: 1.0000 | PACK | 0 refills | Status: DC

## 2023-01-03 NOTE — Patient Instructions (Signed)
Go DOWN to LOWER LEVEL (LL) to have your blood work completed inside of [employer] office.  We will only call you if the results are abnormal or if the provider would like to make medication changes.   We have placed a referral to Gastroenterology for your anemia. They should reach out to within a week. If they do NOT call you, please call them to get scheduled. The information will be on this AVS.

## 2023-01-03 NOTE — Progress Notes (Signed)
PCP: Benjamin Goo, Benjamin Valentine (last seen 08/24) Primary Cardiologist: Benjamin Nordmann, MD (last seen 11/22)  HPI:  Benjamin Valentine is a 77 y/o male with a history of CAD (CABG 2022), HTN, melena, anemia, hyperlipidemia, CKD, arthritis, gout, stroke (10/23), carotid stenosis (angiogram 12/23), previous tobacco use and chronic heart failure.   Admitted 11/11/2020 for elevated tropinin. He presented to the hospital with increasing SOB along with ABD distention, and orthopena. Initial HStrop 59, EKG showed T wave inversion in the inferolateral leads. Echo showed decreased LV function with EF 25-30%, mid-mod LV dilation, mild Benjamin. Underwent R/L HC which showed significant 3-vessel disease with patent LIMA to LAD with occluded SVG to RCA and SVG to OM. RHC showed high normal filling pressure, moderate pulmonary hypertension, and mod to severe reduced CO. He was diuresed with lasix but had increase creatinine above baseline. Admitted back to the hospital on 11/25/20 after presenting with new onset left arm and left knee numbness and weakness. Stroke workup was performed and was negative. He was seen by neurology and was thought symptoms were due to TIA.   Was in the ED 06/01/21 due to dizziness, headache and chest pain. Accidentally took a double dose of his medications today. Symptoms improved and he was released. Was in the ED 08/13/21 due to left wrist gout where he was evaluated and released. Was in the ED 10/23/21 due to left wrist gout where he was treated and released.   Admitted 11/24/21 due to slurred speech and facial droop. Code stroke initiated on arrival. Low NIH, not a candidate for TNK. Discussed with neurology who recommends hospitalization for further stroke work-up. MRI is positive for acute infarct. CTA H&N showed severe multifocal stenosis. Held coreg, Entresto, Lasix, hydralazine and Imdur for permissive HTN, to be resumed after discharge. 01/25/22 had cerebral angiogram with intervention for RICA stenosis.  Admitted 12/06/22 due to SOB, fatigue, dizziness and found to have acute blood loss. Admission hemoglobin was 6.7. IV PPI given along with blood transfusions. Plavix held. IV iron given. To have outpatient GI appt for colonoscopy / possible EGD and to address resumption of plavix.   Echo 11/11/20: EF 25-30% along with mild Benjamin.  Echo 12/07/22: EF 55% with moderate Benjamin, mild AR  RHC/LHC done 11/15/20 and showed: Ost LM lesion is 20% stenosed.   Prox Cx to Mid Cx lesion is 99% stenosed.   Mid Cx to Dist Cx lesion is 99% stenosed.   1st Mrg lesion is 30% stenosed.   Mid LAD lesion is 100% stenosed.   Prox RCA lesion is 100% stenosed.   Origin lesion is 100% stenosed.   SVG.   LIMA graft was visualized by angiography and is normal in caliber.   The graft exhibits no disease. 1.  Significant underlying three-vessel coronary artery disease with occluded mid LAD, occluded proximal right coronary artery and subtotal occlusion of the mid left circumflex after the origin of a large high OM1 which supplies the majority of the left circumflex distribution. Patent LIMA to LAD with occluded SVG to RCA and SVG to OM. The RCA gets left-to-right collaterals. 2.  Left ventricular angiography was not performed due to chronic kidney disease. 3.  Right heart catheterization showed high normal filling pressures, moderate pulmonary hypertension and moderately to severely reduced cardiac output.  He presents today for a HF follow-up visit with a chief complaint of moderate fatigue with minimal exertion. Chronic in nature. Has shortness of breath along with this but feels better since recent admission where  he needed blood transfusion. Denies chest pain, cough, palpitations, abdominal distention/ pain, pedal edema, dizziness, difficulty sleeping or any GI bleeding.   Plavix continues to be held. Per discharge summary, he's supposed to see GI outpatient for possible colonoscopy/ EGD but daughter says that she hasn't heard  about that.   Wore zio monitor 10/24: Wear time: 13 days, 23 hours.  Min HR: 48BPM, Max 190BPM, average HR of 77BPM.  Predominant rhythm was normal sinus rhythm with first degree AVB.  50 SVT runs with longest interval lasting 20.5s at 111BPM.  Infrequent NSVT (x2 runs, 5 beats) PVC burden less than 2%.  ROS: All systems negative except as listed in HPI, PMH and Problem List.  SH:  Social History   Socioeconomic History   Marital status: Widowed    Spouse name: Not on file   Number of children: Not on file   Years of education: Not on file   Highest education level: Not on file  Occupational History   Not on file  Tobacco Use   Smoking status: Former   Smokeless tobacco: Never  Vaping Use   Vaping status: Never Used  Substance and Sexual Activity   Alcohol use: No   Drug use: No   Sexual activity: Not on file  Other Topics Concern   Not on file  Social History Narrative   Not on file   Social Determinants of Health   Financial Resource Strain: Low Risk  (11/30/2022)   Overall Financial Resource Strain (CARDIA)    Difficulty of Paying Living Expenses: Not very hard  Food Insecurity: No Food Insecurity (11/30/2022)   Hunger Vital Sign    Worried About Running Out of Food in the Last Year: Never true    Ran Out of Food in the Last Year: Never true  Transportation Needs: No Transportation Needs (11/30/2022)   PRAPARE - Administrator, Civil Service (Medical): No    Lack of Transportation (Non-Medical): No  Physical Activity: Inactive (11/30/2022)   Exercise Vital Sign    Days of Exercise per Week: 0 days    Minutes of Exercise per Session: 0 min  Stress: No Stress Concern Present (11/30/2022)   [employer] of Occupational Health - Occupational Stress Questionnaire    Feeling of Stress : Not at all  Social Connections: Socially Isolated (11/30/2022)   Social Connection and Isolation Panel [NHANES]    Frequency of Communication with Friends and  Family: More than three times a week    Frequency of Social Gatherings with Friends and Family: More than three times a week    Attends Religious Services: Never    Database administrator or Organizations: No    Attends Banker Meetings: Never    Marital Status: Widowed  Intimate Partner Violence: Not At Risk (11/30/2022)   Humiliation, Afraid, Rape, and Kick questionnaire    Fear of Current or Ex-Partner: No    Emotionally Abused: No    Physically Abused: No    Sexually Abused: No    FH: No family history on file.  Past Medical History:  Diagnosis Date   Arthritis    CHF (congestive heart failure) (HCC)    Chronic HFrEF (heart failure with reduced ejection fraction) (HCC)    a. 10/2020 Echo: EF 25-30%, nl RV fxn, mild Benjamin.   CKD (chronic kidney disease), stage III (HCC)    Coronary artery disease    a. 2002 s/p CABG x 2: LIMA->LAD, VG->RPDA; b. 11/2020 Cath:  LM 20ost, LAD 157m, LCX 99p/m, 54m/d, OM1 30, RCA 100p - fills via collats from LAD. RPL1 fills via collats from LCX. VG->PDA 100, LIMA->LAD ok-->med rx.   Gout    Hyperlipidemia LDL goal <70    Hypertension    Ischemic cardiomyopathy    a. 10/2020 Echo: EF 25-30%.   Osteoarthritis     Current Outpatient Medications  Medication Sig Dispense Refill   allopurinol (ZYLOPRIM) 100 MG tablet Take 1 tablet (100 mg total) by mouth daily. 90 tablet 3   atorvastatin (LIPITOR) 40 MG tablet Take 1 tablet (40 mg total) by mouth daily. 90 tablet 2   carvedilol (COREG) 3.125 MG tablet Take 3.125 mg by mouth daily.     colchicine 0.6 MG tablet Take 2 tabs p.o. x1 then take 1 tab p.o. 1 hour later for gout attack. 20 tablet 0   dapagliflozin propanediol (FARXIGA) 10 MG TABS tablet Take 1 tablet (10 mg total) by mouth daily. 90 tablet 3   hydrALAZINE (APRESOLINE) 50 MG tablet TAKE 1 TABLET BY MOUTH THREE TIMES A DAY (Patient taking differently: Take 50 mg by mouth in the morning and at bedtime.) 270 tablet 2   Iron, Ferrous  Sulfate, 325 (65 Fe) MG TABS Take 325 mg by mouth daily. 90 tablet 1   losartan (COZAAR) 25 MG tablet Take 1 tablet (25 mg total) by mouth daily. 90 tablet 3   Blood Pressure Monitoring (ADULT BLOOD PRESSURE CUFF LG) KIT 1 each by Does not apply route as needed (to check blood pressure). (Patient not taking: Reported on 01/03/2023) 1 kit 0   Blood Pressure Monitoring (SPHYGMOMANOMETER) MISC 1 each by Does not apply route daily. (Patient not taking: Reported on 01/03/2023) 1 each 0   clopidogrel (PLAVIX) 75 MG tablet Take 1 tablet (75 mg total) by mouth daily. (Patient not taking: Reported on 01/03/2023) 90 tablet 1   furosemide (LASIX) 20 MG tablet Take 1 tablet (20 mg total) by mouth daily as needed (As needed for weight gain of 2 pounds overnight). 90 tablet 3   isosorbide mononitrate (IMDUR) 30 MG 24 hr tablet Take 1 tablet (30 mg total) by mouth daily. 90 tablet 3   No current facility-administered medications for this visit.   Vitals:   01/03/23 0949  BP: 122/63  Pulse: 64  SpO2: 100%  Weight: 213 lb (96.6 kg)  Height: 5\' 9"  (1.753 m)   Wt Readings from Last 3 Encounters:  01/03/23 213 lb (96.6 kg)  12/06/22 214 lb (97.1 kg)  11/30/22 215 lb 8 oz (97.8 kg)   Lab Results  Component Value Date   CREATININE 1.81 (H) 11/17/2022   CREATININE 1.79 (H) 09/22/2022   CREATININE 1.53 (H) 01/26/2022   PHYSICAL EXAM:  General:  Well appearing. No resp difficulty HEENT: normal Neck: supple. JVP flat. No lymphadenopathy or thryomegaly appreciated. Cor: PMI normal. Regular rate & rhythm. No rubs, gallops or murmurs. Lungs: clear Abdomen: soft, nontender, nondistended. No hepatosplenomegaly. No bruits or masses.  Extremities: no cyanosis, clubbing, rash, trace pitting edema around bilateral ankles Neuro: alert & orientedx3, cranial nerves grossly intact. Moves all 4 extremities w/o difficulty. Affect pleasant.   ECG: not done  ASSESSMENT & PLAN:  1: NICM with reduced ejection  fraction- - suspect due to HTN - NYHA class III - euvolemic today - not weighing daily but does have scales; encouraged to resume weighing daily and to call for an overnight weight gain of > 2 pounds or a weekly weight gain  of > 5 pounds - weight stable from last visit here 1 month ago - Echo 11/11/20: EF 25-30% along with mild Benjamin.  - Echo 12/07/22: EF 55% with moderate Benjamin, mild AR (done @ [facility]) - patient had echo done earlier today not realizing he just had one done last month during admission - question whether he needs cMRI or possible repeat cath - not adding salt and friend ("daughter") does most of the cooking and doesn't cook with salt either - continue carvedilol 3.125mg  BID (unable to tolerate metoprolol) - continue farxiga 10mg  daily - continue furosemide 20mg  PRN - continue hydralazine 50mg  BID/ isosorbide MN 30mg  daily - continue losartan 25mg  daily - most recent potassium 5.0 so may not be a candidate for MRA - unable to tolerate entresto - BMET today - proBNT 12/07/22 was 387.0  2: HTN with CKD- - BP 122/63 - saw PCP Benjamin Valentine) 08/24 - BMP 12/21/22 reviewed and showed sodium 142, potassium 5.0, creatinine 1.33 and GFR 55 - saw nephrology (Benjamin Valentine) 11/24  3: CAD- - CABG back in 2002 - saw cardiology Benjamin Valentine) 11/22; has f/u scheduled 12/24 - continue atorvastatin 40mg  daily - continue ASA 81mg  daily - plavix currently on hold due to recent GIB - LDL 09/22/22 was 78 - RHC/LHC done 11/15/20 and showed: Ost LM lesion is 20% stenosed.   Prox Cx to Mid Cx lesion is 99% stenosed.   Mid Cx to Dist Cx lesion is 99% stenosed.   1st Mrg lesion is 30% stenosed.   Mid LAD lesion is 100% stenosed.   Prox RCA lesion is 100% stenosed.   Origin lesion is 100% stenosed.   SVG.   LIMA graft was visualized by angiography and is normal in caliber.   The graft exhibits no disease. 1.  Significant underlying three-vessel coronary artery disease with occluded mid LAD, occluded proximal right  coronary artery and subtotal occlusion of the mid left circumflex after the origin of a large high OM1 which supplies the majority of the left circumflex distribution. Patent LIMA to LAD with occluded SVG to RCA and SVG to OM. The RCA gets left-to-right collaterals. 2.  Left ventricular angiography was not performed due to chronic kidney disease. 3.  Right heart catheterization showed high normal filling pressures, moderate pulmonary hypertension and moderately to severely reduced cardiac output.  4: Stroke- - 01/25/22 had cerebral angiogram with intervention for RICA stenosis - has not  had f/u with Dr. Corliss Valentine, interventional radiology  - LDL 09/22/22 was 78 - continue atorvastatin 40mg  daily  5: Diabetes- - A1c 09/22/22 was 7.0%3  6: Anemia- - received blood transfusions during 10/24 admission - HG 12/08/22 was 8.2 (on admission it was 6.7) - GI referral placed today for anemia; d/c summary recommends outpatient colonoscopy/ possible EGD - CBC today   Return in 1 month, sooner if needed.

## 2023-01-03 NOTE — Progress Notes (Signed)
*  PRELIMINARY RESULTS* Echocardiogram 2D Echocardiogram has been performed.  Cristela Blue 01/03/2023, 9:37 AM

## 2023-01-04 LAB — BASIC METABOLIC PANEL
BUN/Creatinine Ratio: 17 (ref 10–24)
BUN: 26 mg/dL (ref 8–27)
CO2: 22 mmol/L (ref 20–29)
Calcium: 9.7 mg/dL (ref 8.6–10.2)
Chloride: 105 mmol/L (ref 96–106)
Creatinine, Ser: 1.56 mg/dL — ABNORMAL HIGH (ref 0.76–1.27)
Glucose: 88 mg/dL (ref 70–99)
Potassium: 5.3 mmol/L — ABNORMAL HIGH (ref 3.5–5.2)
Sodium: 142 mmol/L (ref 134–144)
eGFR: 45 mL/min/{1.73_m2} — ABNORMAL LOW (ref >59)

## 2023-01-04 LAB — CBC
Hematocrit: 37.7 % (ref 37.5–51.0)
Hemoglobin: 10.8 g/dL — ABNORMAL LOW (ref 13.0–17.7)
MCH: 23.8 pg — ABNORMAL LOW (ref 26.6–33.0)
MCHC: 28.6 g/dL — ABNORMAL LOW (ref 31.5–35.7)
MCV: 83 fL (ref 79–97)
Platelets: 363 10*3/uL (ref 150–450)
RBC: 4.53 x10E6/uL (ref 4.14–5.80)
RDW: 22.4 % — ABNORMAL HIGH (ref 11.6–15.4)
WBC: 7.1 10*3/uL (ref 3.4–10.8)

## 2023-01-05 ENCOUNTER — Telehealth: Payer: Self-pay

## 2023-01-05 DIAGNOSIS — I5022 Chronic systolic (congestive) heart failure: Secondary | ICD-10-CM

## 2023-01-05 NOTE — Telephone Encounter (Signed)
-----   Message from Delma Freeze sent at 01/04/2023 10:57 AM EST ----- Kidney function improving. Hemoglobin looks good at 10.8 which has improved from your discharge. Your potassium level is a little high so take your furosemide for 2 days. Recheck BMET in 2 weeks.

## 2023-01-15 NOTE — Progress Notes (Unsigned)
Cardiology Office Note  Date:  01/16/2023   ID:  Zoren, Bird [birth year], MRN [id number]  PCP:  Berniece Salines, [name]   Chief Complaint  Patient presents with   Follow up [facility] GI bleed/chest pain     Patient would like to discuss Echo & Zio monitor results. Patient is waiting on a Blood pressure cuff that was ordered at his wellness check with PCP. Patient c/o gout in left foot. Medications reviewed by the patient verbally.     HPI:  77 year old-male with past medical history of  CAD s/p bypass surgery in 2022,  Ejection fraction 25 to 30% in September 2022, has since normalized Coronary artery disease by catheterization October 2022, mid LAD 100%, proximal RCA 100%, circumflex 99% proximal mid and distal Hx of CABG HTN, HLD, OA, gout, and HFrEF  Acute stroke November 24, 2021 PAD, carotid stenosis on right, status post stent assisted angioplasty 01/2022 Who presents for follow-up of his coronary disease, history of CABG  last seen by myself in clinic January 2023 Followed by the CHF clinic Wants to discuss testing done by CHF clinic including echo and monitor  Echocardiogram January 03, 2023 EF 50 to 55%, moderate LVH RV normal size and function No significant valvular heart disease  Event monitor Normal sinus rhythm Rare episodes SVT longest 20 seconds, PVC burden less than 2%  Reviewed hospital admission November 24, 2021 acute stroke, 01/25/22 had cerebral angiogram with intervention for RICA stenosis  Started on Plavix and aspirin Admitted December 06, 2022 to [facility] for acute blood loss hemoglobin 6.7, Plavix held  Labs reviewed A1C 7.0  Has gout left ankle, requesting refill of his colchicine  EKG personally reviewed by myself on todays visit EKG Interpretation Date/Time:  Tuesday January 16 2023 15:11:30 EST Ventricular Rate:  76 PR Interval:  212 QRS Duration:  108 QT Interval:  398 QTC Calculation: 447 R Axis:   54  Text Interpretation: Sinus rhythm  with 1st degree A-V block Left ventricular hypertrophy with repolarization abnormality ( Sokolow-Lyon , Romhilt-Estes ) Cannot rule out Inferior infarct , age undetermined When compared with ECG of 24-Nov-2021 13:26, PREVIOUS ECG IS PRESENT Confirmed by Julien Nordmann ([id number]) on 01/16/2023 3:21:54 PM    Cardiac catheterization October 2022  R/L [facility] which showed significant 3-vessel disease with patent LIMA to LAD with occluded SVG to RCA and SVG to OM. RHC showed high normal filling pressure, moderate pulmonary hypertension, and mod to severe reduced CO.   admitted back to the hospital on 11/25/20 after presenting with new onset left arm and left knee numbness and weakness.  Stroke workup was performed and was negative.  He was seen by neurology and was thought symptoms were due to TIA.    PMH:   has a past medical history of Arthritis, CHF (congestive heart failure) (HCC), Chronic HFrEF (heart failure with reduced ejection fraction) (HCC), CKD (chronic kidney disease), stage III (HCC), Coronary artery disease, Gout, Hyperlipidemia LDL goal <70, Hypertension, Ischemic cardiomyopathy, and Osteoarthritis.  PSH:    Past Surgical History:  Procedure Laterality Date   CARDIAC CATHETERIZATION     CARDIAC SURGERY     CABG 2002   IR ANGIO EXTRACRAN SEL COM CAROTID INNOMINATE UNI BILAT MOD SED  01/03/2022   IR ANGIO INTRA EXTRACRAN SEL INTERNAL CAROTID UNI R MOD SED  01/29/2022   IR ANGIO VERTEBRAL SEL VERTEBRAL UNI L MOD SED  01/03/2022   IR CT HEAD LTD  01/29/2022   IR INTRA CRAN  STENT  01/25/2022   IR RADIOLOGIST EVAL & MGMT  12/21/2021   IR US GUIDE VASC ACCESS RIGHT  01/03/2022   IR US GUIDE VASC ACCESS RIGHT  01/25/2022   LAPAROSCOPIC APPENDECTOMY N/A 08/11/2018   Procedure: APPENDECTOMY LAPAROSCOPIC;  Surgeon: Henrene Dodge, MD;  Location: [facility] ORS;  Service: General;  Laterality: N/A;   RADIOLOGY WITH ANESTHESIA N/A 01/25/2022   Procedure: Cerebral angioplasty with possible stenting;   Surgeon: Julieanne Cotton, MD;  Location: [facility] OR;  Service: Radiology;  Laterality: N/A;   RIGHT/LEFT HEART CATH AND CORONARY/GRAFT ANGIOGRAPHY N/A 11/15/2020   Procedure: RIGHT/LEFT HEART CATH AND CORONARY/GRAFT ANGIOGRAPHY;  Surgeon: Iran Ouch, MD;  Location: [facility] INVASIVE CV LAB;  Service: Cardiovascular;  Laterality: N/A;    Current Outpatient Medications  Medication Sig Dispense Refill   allopurinol (ZYLOPRIM) 100 MG tablet Take 1 tablet (100 mg total) by mouth daily. 90 tablet 3   aspirin EC 81 MG tablet Take 81 mg by mouth daily. Swallow whole.     atorvastatin (LIPITOR) 40 MG tablet Take 1 tablet (40 mg total) by mouth daily. 90 tablet 2   carvedilol (COREG) 3.125 MG tablet Take 3.125 mg by mouth 2 (two) times daily with a meal.     dapagliflozin propanediol (FARXIGA) 10 MG TABS tablet Take 1 tablet (10 mg total) by mouth daily. 90 tablet 3   furosemide (LASIX) 20 MG tablet Take 1 tablet (20 mg total) by mouth daily as needed (As needed for weight gain of 2 pounds overnight). 90 tablet 3   hydrALAZINE (APRESOLINE) 50 MG tablet TAKE 1 TABLET BY MOUTH THREE TIMES A DAY (Patient taking differently: Take 50 mg by mouth in the morning and at bedtime.) 270 tablet 2   Iron, Ferrous Sulfate, 325 (65 Fe) MG TABS Take 325 mg by mouth daily. 90 tablet 1   isosorbide mononitrate (IMDUR) 30 MG 24 hr tablet Take 1 tablet (30 mg total) by mouth daily. 90 tablet 3   losartan (COZAAR) 25 MG tablet Take 1 tablet (25 mg total) by mouth daily. 90 tablet 3   Blood Pressure Monitoring (ADULT BLOOD PRESSURE CUFF LG) KIT 1 each by Does not apply route as needed (to check blood pressure). (Patient not taking: Reported on 01/03/2023) 1 kit 0   Blood Pressure Monitoring (ADULT BLOOD PRESSURE CUFF LG) KIT 1 each by Does not apply route as directed. (Patient not taking: Reported on 01/16/2023) 1 kit 0   Blood Pressure Monitoring (SPHYGMOMANOMETER) MISC 1 each by Does not apply route daily. (Patient not taking:  Reported on 01/03/2023) 1 each 0   clopidogrel (PLAVIX) 75 MG tablet Take 1 tablet (75 mg total) by mouth daily. (Patient not taking: Reported on 01/16/2023) 90 tablet 1   colchicine 0.6 MG tablet Take 2 tabs p.o. x1 then take 1 tab p.o. 1 hour later for gout attack. (Patient not taking: Reported on 01/16/2023) 20 tablet 0   No current facility-administered medications for this visit.     Allergies:   Entresto [sacubitril-valsartan] and Metoprolol   Social History:  The patient  reports that he has quit smoking. He has never used smokeless tobacco. He reports that he does not drink alcohol and does not use drugs.   Family History:   family history is not on file.    Review of Systems: Review of Systems  Constitutional: Negative.   HENT: Negative.    Respiratory: Negative.    Cardiovascular: Negative.   Gastrointestinal: Negative.   Musculoskeletal: Negative.  Neurological: Negative.   Psychiatric/Behavioral: Negative.    All other systems reviewed and are negative.    PHYSICAL EXAM: VS:  BP (!) 140/60 (BP Location: Left Arm, Patient Position: Sitting, Cuff Size: Normal)   Pulse 76   Ht 5\' 9"  (1.753 m)   Wt 213 lb 5 oz (96.8 kg)   SpO2 99%   BMI 31.50 kg/m  , BMI Body mass index is 31.5 kg/m. Constitutional:  oriented to person, place, and time. No distress.  HENT:  Head: Grossly normal Eyes:  no discharge. No scleral icterus.  Neck: No JVD, no carotid bruits  Cardiovascular: Regular rate and rhythm, no murmurs appreciated Pulmonary/Chest: Clear to auscultation bilaterally, no wheezes or rails Abdominal: Soft.  no distension.  no tenderness.  Musculoskeletal: Normal range of motion Neurological:  normal muscle tone. Coordination normal. No atrophy Skin: Skin warm and dry Psychiatric: normal affect, pleasant  Recent Labs: 09/22/2022: ALT 13 11/17/2022: B Natriuretic Peptide 94.1 01/03/2023: BUN 26; Creatinine, Ser 1.56; Hemoglobin 10.8; Platelets 363; Potassium 5.3;  Sodium 142    Lipid Panel Lab Results  Component Value Date   CHOL 134 09/22/2022   HDL 27 (L) 09/22/2022   LDLCALC 78 09/22/2022   TRIG 191 (H) 09/22/2022      Wt Readings from Last 3 Encounters:  01/16/23 213 lb 5 oz (96.8 kg)  01/03/23 213 lb (96.6 kg)  12/06/22 214 lb (97.1 kg)      ASSESSMENT AND PLAN:  Problem List Items Addressed This Visit       Cardiology Problems   Essential hypertension   Relevant Medications   carvedilol (COREG) 3.125 MG tablet   Other Relevant Orders   EKG 12-Lead (Completed)   Coronary artery disease involving native coronary artery of native heart without angina pectoris   Relevant Medications   carvedilol (COREG) 3.125 MG tablet   Other Relevant Orders   EKG 12-Lead (Completed)   Other Visit Diagnoses     Chronic systolic heart failure (HCC)    -  Primary   Relevant Medications   carvedilol (COREG) 3.125 MG tablet   Other Relevant Orders   EKG 12-Lead (Completed)   Cerebrovascular accident (CVA), unspecified mechanism (HCC)       Relevant Medications   carvedilol (COREG) 3.125 MG tablet   Other Relevant Orders   EKG 12-Lead (Completed)   Type 2 diabetes mellitus with stage 3b chronic kidney disease, without long-term current use of insulin (HCC)       Anemia, unspecified type       Palpitations       Relevant Orders   EKG 12-Lead (Completed)   TIA (transient ischemic attack)       Relevant Medications   carvedilol (COREG) 3.125 MG tablet   Hyperlipidemia LDL goal <70       Relevant Medications   carvedilol (COREG) 3.125 MG tablet      Ischemic cardiomyopathy Echocardiogram results reviewed with him November 2024 ejection fraction 50 to 55% Appears euvolemic Recommend he continue carvedilol 3.125 twice daily, Farxiga, Lasix as needed, hydralazine 50 twice daily, Imdur 30 daily losartan 25 daily Close monitoring of blood pressure recommended  Coronary disease with history of CABG stable angina Severe multivessel  coronary disease on cardiac catheterization October 2022 Denies anginal symptoms On beta-blocker statin nitrate Cholesterol above goal, management as below  Hyperlipidemia Recommend he continue Lipitor 40 daily, recommend he add Zetia 10 mg daily  Essential hypertension Blood pressure is well controlled on today's visit. No changes  made to the medications.  Long discussion concerning numerous TIAs, strokes, PAD, history of cardiomyopathy, management with current medications   Signed, Dossie Arbour, M.D., Ph.D. [facility] [place], [place] [phone]

## 2023-01-16 ENCOUNTER — Encounter: Payer: Self-pay | Admitting: Cardiovascular Disease

## 2023-01-16 ENCOUNTER — Ambulatory Visit: Payer: Self-pay

## 2023-01-16 ENCOUNTER — Ambulatory Visit: Payer: 59 | Attending: Cardiovascular Disease | Admitting: Cardiovascular Disease

## 2023-01-16 VITALS — BP 140/60 | HR 76 | Ht 69.0 in | Wt 213.3 lb

## 2023-01-16 DIAGNOSIS — E785 Hyperlipidemia, unspecified: Secondary | ICD-10-CM

## 2023-01-16 DIAGNOSIS — I10 Essential (primary) hypertension: Secondary | ICD-10-CM | POA: Diagnosis not present

## 2023-01-16 DIAGNOSIS — G459 Transient cerebral ischemic attack, unspecified: Secondary | ICD-10-CM

## 2023-01-16 DIAGNOSIS — R002 Palpitations: Secondary | ICD-10-CM

## 2023-01-16 DIAGNOSIS — N1832 Chronic kidney disease, stage 3b: Secondary | ICD-10-CM | POA: Diagnosis not present

## 2023-01-16 DIAGNOSIS — D649 Anemia, unspecified: Secondary | ICD-10-CM

## 2023-01-16 DIAGNOSIS — I2510 Atherosclerotic heart disease of native coronary artery without angina pectoris: Secondary | ICD-10-CM | POA: Diagnosis not present

## 2023-01-16 DIAGNOSIS — I5022 Chronic systolic (congestive) heart failure: Secondary | ICD-10-CM

## 2023-01-16 DIAGNOSIS — I639 Cerebral infarction, unspecified: Secondary | ICD-10-CM | POA: Diagnosis not present

## 2023-01-16 DIAGNOSIS — E1122 Type 2 diabetes mellitus with diabetic chronic kidney disease: Secondary | ICD-10-CM

## 2023-01-16 MED ORDER — EZETIMIBE 10 MG PO TABS
10.0000 mg | ORAL_TABLET | Freq: Every day | ORAL | 3 refills | Status: DC
Start: 2023-01-16 — End: 2023-11-05

## 2023-01-16 MED ORDER — COLCHICINE 0.6 MG PO TABS: ORAL_TABLET | ORAL | 0 refills | Status: DC

## 2023-01-16 NOTE — Patient Instructions (Addendum)
Medication Instructions:  Hold the aspirin Restart plavix 75 mg once a day  Please start zetia 10 mg daily   If you need a refill on your cardiac medications before your next appointment, please call your pharmacy.   Lab work: No new labs needed  Testing/Procedures: No new testing needed  Follow-Up: At [facility], you and your health needs are our priority.  As part of our continuing mission to provide you with exceptional heart care, we have created designated Provider Care Teams.  These Care Teams include your primary Cardiologist (physician) and Advanced Practice Providers (APPs -  Physician Assistants and Nurse Practitioners) who all work together to provide you with the care you need, when you need it.  You will need a follow up appointment in 6 months  Providers on your designated Care Team:   Nicolasa Ducking, NP Eula Listen, [name] Cadence Fransico Michael, [place]  COVID-19 Vaccine Information can be found at: [email or web address] For questions related to vaccine distribution or appointments, please email [email or web address] or call [phone].

## 2023-01-16 NOTE — Telephone Encounter (Signed)
Tried calling the patient to sch an appt but no voice mail just kept ringing

## 2023-01-16 NOTE — Telephone Encounter (Signed)
Chief Complaint: Foot Pain Symptoms: Right foot swelling, moderate pain  Frequency: constant Pertinent Negatives: Patient denies redness, fever, injury Disposition: [] ED /[] Urgent Care (no appt availability in office) / [] Appointment(In office/virtual)/ []  [place] [facility]/ [] Home Care/ [] Refused Recommended Disposition /[] [place]/ [x]  Follow-up with PCP Additional Notes: Patient's daughter Juline Patch (signed DPR on file) stated patient has been having right foot pain and believes it is a gout flare up. Patient told his daughter he is taking his Allopurinol as prescribed but believes he needs Colchicine 0.6 MG to treat the flare-up. Vicky stated the top of the foot near the Great toe is swollen and warm to touch. Care advice was given and [name] was offered an appointment but [name] stated he has an appointment today with Cardiology and he has an appointment on 01/25/23 with PCP. Daughter is [facility] [address] in [place]. Advised Vicky I would forward request to PCP for recommendation.  Summary: gout   Juline Patch states pt called her last night stating that the gout in his right foot has flared up and she is wanting to see if his prescription could be called into the pharmacy. Please advise.  colchicine 0.6 MG tablet    [id number] [place], [place] - [address] Phone: [phone] Fax: [phone]     Reason for Disposition  [1] Swollen foot AND [2] no fever  (Exceptions: localized bump from bunions, calluses, insect bite, sting)  Answer Assessment - Initial Assessment Questions 1. ONSET: "When did the pain start?"      Last week  2. LOCATION: "Where is the pain located?"      Right foot 3. PAIN: "How bad is the pain?"    (Scale 1-10; or mild, moderate, severe)  - MILD (1-3): doesn't interfere with normal activities.   - MODERATE (4-7): interferes with normal activities (e.g., work or school) or awakens from sleep, limping.    - SEVERE (8-10): excruciating pain, unable to do any normal activities, unable to walk.      Moderate 4. WORK OR EXERCISE: "Has there been any recent work or exercise that involved this part of the body?"      No  5. CAUSE: "What do you think is causing the foot pain?"     Gout 6. OTHER SYMPTOMS: "Do you have any other symptoms?" (e.g., leg pain, rash, fever, numbness)     Swollen  Protocols used: Foot Pain-A-AH

## 2023-01-24 NOTE — Progress Notes (Unsigned)
   There were no vitals taken for this visit.   Subjective:    Patient ID: Benjamin Valentine, male    DOB: October 15, 1945, 77 y.o.   MRN: [id number]  HPI: Benjamin Valentine is a 77 y.o. male  No chief complaint on file.   Discussed the use of AI scribe software for clinical note transcription with the patient, who gave verbal consent to proceed.  History of Present Illness           11/30/2022    1:19 PM 09/22/2022    9:28 AM 11/15/2021   12:44 PM  Depression screen PHQ 2/9  Decreased Interest 0 0 0  Down, Depressed, Hopeless 0 0 0  PHQ - 2 Score 0 0 0    Relevant past medical, surgical, family and social history reviewed and updated as indicated. Interim medical history since our last visit reviewed. Allergies and medications reviewed and updated.  Review of Systems  Per HPI unless specifically indicated above     Objective:    There were no vitals taken for this visit.  {Vitals History (Optional):23777} Wt Readings from Last 3 Encounters:  01/16/23 213 lb 5 oz (96.8 kg)  01/03/23 213 lb (96.6 kg)  12/06/22 214 lb (97.1 kg)    Physical Exam  Results for orders placed or performed during the hospital encounter of 01/03/23  ECHOCARDIOGRAM COMPLETE  Result Value Ref Range   Ao pk vel 0.82 m/s   AV Area VTI 4.28 cm2   AR max vel 4.85 cm2   AV Mean grad 2.0 mmHg   AV Peak grad 2.7 mmHg   Single Plane A2C EF 20.3 %   Single Plane A4C EF 37.5 %   Calc EF 30.0 %   AV Area mean vel 4.31 cm2   Area-P 1/2 3.89 cm2   MV VTI 3.34 cm2   Est EF 50 - 55%    {Labs (Optional):23779}    Assessment & Plan:   Problem List Items Addressed This Visit   None    Assessment and Plan             Follow up plan: No follow-ups on file.

## 2023-01-25 ENCOUNTER — Ambulatory Visit: Payer: 59 | Admitting: Nurse Practitioner

## 2023-01-29 ENCOUNTER — Telehealth: Payer: Self-pay | Admitting: Nurse Practitioner

## 2023-01-29 ENCOUNTER — Ambulatory Visit: Payer: 59 | Admitting: Nurse Practitioner

## 2023-01-29 ENCOUNTER — Encounter: Payer: Self-pay | Admitting: Nurse Practitioner

## 2023-01-29 ENCOUNTER — Other Ambulatory Visit: Payer: Self-pay

## 2023-01-29 VITALS — BP 132/86 | HR 89 | Temp 97.7°F | Resp 18 | Ht 69.0 in | Wt 210.6 lb

## 2023-01-29 DIAGNOSIS — Z8673 Personal history of transient ischemic attack (TIA), and cerebral infarction without residual deficits: Secondary | ICD-10-CM

## 2023-01-29 DIAGNOSIS — J449 Chronic obstructive pulmonary disease, unspecified: Secondary | ICD-10-CM | POA: Diagnosis not present

## 2023-01-29 DIAGNOSIS — M1A3790 Chronic gout due to renal impairment, unspecified ankle and foot, without tophus (tophi): Secondary | ICD-10-CM | POA: Diagnosis not present

## 2023-01-29 DIAGNOSIS — I129 Hypertensive chronic kidney disease with stage 1 through stage 4 chronic kidney disease, or unspecified chronic kidney disease: Secondary | ICD-10-CM | POA: Insufficient documentation

## 2023-01-29 DIAGNOSIS — Z951 Presence of aortocoronary bypass graft: Secondary | ICD-10-CM

## 2023-01-29 DIAGNOSIS — I10 Essential (primary) hypertension: Secondary | ICD-10-CM | POA: Diagnosis not present

## 2023-01-29 DIAGNOSIS — I2510 Atherosclerotic heart disease of native coronary artery without angina pectoris: Secondary | ICD-10-CM

## 2023-01-29 DIAGNOSIS — N1832 Chronic kidney disease, stage 3b: Secondary | ICD-10-CM

## 2023-01-29 DIAGNOSIS — N1831 Chronic kidney disease, stage 3a: Secondary | ICD-10-CM | POA: Diagnosis not present

## 2023-01-29 DIAGNOSIS — E785 Hyperlipidemia, unspecified: Secondary | ICD-10-CM

## 2023-01-29 DIAGNOSIS — E1122 Type 2 diabetes mellitus with diabetic chronic kidney disease: Secondary | ICD-10-CM | POA: Diagnosis not present

## 2023-01-29 DIAGNOSIS — I5032 Chronic diastolic (congestive) heart failure: Secondary | ICD-10-CM

## 2023-01-29 DIAGNOSIS — E1165 Type 2 diabetes mellitus with hyperglycemia: Secondary | ICD-10-CM | POA: Diagnosis not present

## 2023-01-29 LAB — POCT GLYCOSYLATED HEMOGLOBIN (HGB A1C): Hemoglobin A1C: 5.7 % — AB (ref 4.0–5.6)

## 2023-01-29 MED ORDER — ADULT BLOOD PRESSURE CUFF LG KIT: 1.0000 | PACK | 0 refills | Status: AC | PRN

## 2023-01-29 NOTE — Progress Notes (Signed)
BP 132/86 (BP Location: Right Arm, Patient Position: Sitting, Cuff Size: Large)   Pulse 89   Temp 97.7 F (36.5 C) (Oral)   Resp 18   Ht 5\' 9"  (1.753 m) Comment: per patient  Wt 210 lb 9.6 oz (95.5 kg)   SpO2 98%   BMI 31.10 kg/m    Subjective:    Patient ID: Benjamin Valentine, male    DOB: January 02, 1946, 77 y.o.   MRN: [id number]  HPI: Benjamin Valentine is a 77 y.o. male  Chief Complaint  Patient presents with   Medical Management of Chronic Issues    Discussed the use of AI scribe software for clinical note transcription with the patient, who gave verbal consent to proceed.  History of Present Illness   The patient, with a history of coronary artery disease, hypertension, congestive heart failure, COPD, chronic gout, hyperlipidemia, TIA, and type 2 diabetes, presents for a routine follow-up. The patient's diabetes was recently diagnosed, with an A1c of 7.0. The patient is currently on Farxiga for heart protection, which also works for diabetes. The patient has not reported any new symptoms or changes in health since the last visit. The patient's blood pressure is borderline but acceptable for his age. The patient has not reported any issues with his current medications. patient reports no issues with his breathing.  denies any chest pain or shortness of breath.       01/29/2023   10:29 AM 11/30/2022    1:19 PM 09/22/2022    9:28 AM  Depression screen PHQ 2/9  Decreased Interest 0 0 0  Down, Depressed, Hopeless 0 0 0  PHQ - 2 Score 0 0 0  Altered sleeping 0    Tired, decreased energy 0    Change in appetite 0    Feeling bad or failure about yourself  0    Trouble concentrating 0    Moving slowly or fidgety/restless 0    Suicidal thoughts 0    PHQ-9 Score 0      Relevant past medical, surgical, family and social history reviewed and updated as indicated. Interim medical history since our last visit reviewed. Allergies and medications reviewed and updated.  Review of  Systems  Constitutional: Negative for fever or weight change.  Respiratory: Negative for cough and shortness of breath.   Cardiovascular: Negative for chest pain or palpitations.  Gastrointestinal: Negative for abdominal pain, no bowel changes.  Musculoskeletal: Negative for gait problem or joint swelling.  Skin: Negative for rash.  Neurological: Negative for dizziness or headache.  No other specific complaints in a complete review of systems (except as listed in HPI above).      Objective:    BP 132/86 (BP Location: Right Arm, Patient Position: Sitting, Cuff Size: Large)   Pulse 89   Temp 97.7 F (36.5 C) (Oral)   Resp 18   Ht 5\' 9"  (1.753 m) Comment: per patient  Wt 210 lb 9.6 oz (95.5 kg)   SpO2 98%   BMI 31.10 kg/m    Wt Readings from Last 3 Encounters:  01/29/23 210 lb 9.6 oz (95.5 kg)  01/16/23 213 lb 5 oz (96.8 kg)  01/03/23 213 lb (96.6 kg)    Physical Exam  Constitutional: Patient appears well-developed and well-nourished. Obese  No distress.  HEENT: head atraumatic, normocephalic, pupils equal and reactive to light, neck supple Cardiovascular: Normal rate, regular rhythm and normal heart sounds.  No murmur heard. No BLE edema. Pulmonary/Chest: Effort normal and breath sounds  normal. No respiratory distress. Abdominal: Soft.  There is no tenderness. Psychiatric: Patient has a normal mood and affect. behavior is normal. Judgment and thought content normal.       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)   Gfr 35,  on farxiga        Musculoskeletal and Integument   Chronic gout due to renal impairment involving foot without tophus     Genitourinary   Chronic kidney disease, stage 3b (HCC)     Other   Hx of CABG   Morbid (severe) obesity due to excess calories (HCC)   Comorbidities include HTN, HLD, DM, CHF, CKD Work on lifestyle modification      Hyperlipidemia   History of TIA (transient ischemic attack)   Other Visit Diagnoses       Type 2 diabetes mellitus with hyperglycemia, without long-term current use of insulin (HCC)       Relevant Orders   POCT glycosylated hemoglobin (Hb A1C) (Completed)        Assessment and Plan    Type 2 Diabetes Mellitus A1c increased to 7.0, indicating a shift from prediabetes to diabetes. Patient is already on [place] 10mg  daily for heart protection, which also serves as a diabetes medication. -Continue Farxiga 10mg  daily. -Check A1c today. 5.7 -Plan for annual diabetic health maintenance:urine microalbumin urine, foot exam, and eye exam at next visit.  Hyperlipidemia Patient is on Atorvastatin 40mg  daily and Zetia 10mg  daily. No recent changes reported. -Continue current medications.  Hypertension Patient is on Coreg 3.125mg  twice daily, Hydralazine 50mg  twice daily, and Losartan 25mg  daily. Blood pressure today is 132/86, which is borderline but acceptable for patient's age. -Continue current medications.  Gout Patient is on Allopurinol 100mg  daily and Colchicine as needed for gout attacks. -Continue current medications.  Congestive Heart Failure Patient is on Lasix 20mg  as needed for weight gain of two pounds overnight. -Continue current medication.  Chronic Obstructive Pulmonary Disease (COPD) No changes reported. -not currently on any medications  Coronary Artery Disease managed by cardiology -Continue current management.  Blood Pressure  Monitoring Patient has had difficulty obtaining a home blood pressure monitor from the pharmacy. -[facility]  General Health Maintenance / Followup Plans -Return in 4 months for follow-up and annual diabetic health maintenance.       CKD Followed by nephrology. -last gfr 45  Follow up plan: Return in about 4 months (around 05/30/2023) for follow up.

## 2023-01-29 NOTE — Telephone Encounter (Unsigned)
Copied from CRM [id number]. Topic: General - Other >> Jan 29, 2023 11:16 AM Marlow Baars wrote: Reason for CRM: The patient called in stating they were not able to pick up the blood pressure cuff at the pharmacy rather it needs to sent to a medical supply company. They would like to use [facility] on [address]. Please send in the script to them. Please assist patient further

## 2023-01-29 NOTE — Assessment & Plan Note (Signed)
Gfr 35,  on farxiga

## 2023-01-29 NOTE — Assessment & Plan Note (Signed)
Comorbidities include HTN, HLD, DM, CHF, CKD Work on lifestyle modification

## 2023-01-30 ENCOUNTER — Telehealth: Payer: Self-pay | Admitting: Family

## 2023-01-30 NOTE — Telephone Encounter (Signed)
Lvm for pt to confirm appt for 01/31/23

## 2023-01-30 NOTE — Progress Notes (Deleted)
PCP: Della Goo, [name] (last seen 12/24) Primary Cardiologist: Julien Nordmann, MD (last seen 12/24)  HPI:  Benjamin Valentine is a 77 y/o male with a history of CAD (CABG 2022), HTN, melena, anemia, hyperlipidemia, CKD, arthritis, gout, stroke (10/23), carotid stenosis (angiogram 12/23), previous tobacco use and chronic heart failure.   Admitted 11/11/2020 for elevated tropinin. He presented to the hospital with increasing SOB along with ABD distention, and orthopena. Initial HStrop 59, EKG showed T wave inversion in the inferolateral leads. Echo showed decreased LV function with EF 25-30%, mid-mod LV dilation, mild Benjamin. Underwent R/L HC which showed significant 3-vessel disease with patent LIMA to LAD with occluded SVG to RCA and SVG to OM. RHC showed high normal filling pressure, moderate pulmonary hypertension, and mod to severe reduced CO. He was diuresed with lasix but had increase creatinine above baseline. Admitted back to the hospital on 11/25/20 after presenting with new onset left arm and left knee numbness and weakness. Stroke workup was performed and was negative. He was seen by neurology and was thought symptoms were due to TIA.   Was in the ED 06/01/21 due to dizziness, headache and chest pain. Accidentally took a double dose of his medications today. Symptoms improved and he was released. Was in the ED 08/13/21 due to left wrist gout where he was evaluated and released. Was in the ED 10/23/21 due to left wrist gout where he was treated and released.   Admitted 11/24/21 due to slurred speech and facial droop. Code stroke initiated on arrival. Low NIH, not a candidate for TNK. Discussed with neurology who recommends hospitalization for further stroke work-up. MRI is positive for acute infarct. CTA H&N showed severe multifocal stenosis. Held coreg, Entresto, Lasix, hydralazine and Imdur for permissive HTN, to be resumed after discharge. 01/25/22 had cerebral angiogram with intervention for RICA stenosis.  Admitted 12/06/22 due to SOB, fatigue, dizziness and found to have acute blood loss. Admission hemoglobin was 6.7. IV PPI given along with blood transfusions. Plavix held. IV iron given. To have outpatient GI appt for colonoscopy / possible EGD and to address resumption of plavix.   Echo 11/11/20: EF 25-30% along with mild Benjamin.  Echo 12/07/22: EF 55% with moderate Benjamin, mild AR Echo 01/03/23: EF 50-55% with moderate LVH, Grade I DD, mild Benjamin and normal PA pressure of 17.2 mmHg  He presents today for a HF follow-up visit with a chief complaint of   At last visit, he was told to take his furosemide for 2 days due to K+ >5.0  Previous cardiac studies:  RHC/LHC done 11/15/20 and showed: Ost LM lesion is 20% stenosed.   Prox Cx to Mid Cx lesion is 99% stenosed.   Mid Cx to Dist Cx lesion is 99% stenosed.   1st Mrg lesion is 30% stenosed.   Mid LAD lesion is 100% stenosed.   Prox RCA lesion is 100% stenosed.   Origin lesion is 100% stenosed.   SVG.   LIMA graft was visualized by angiography and is normal in caliber.   The graft exhibits no disease. 1.  Significant underlying three-vessel coronary artery disease with occluded mid LAD, occluded proximal right coronary artery and subtotal occlusion of the mid left circumflex after the origin of a large high OM1 which supplies the majority of the left circumflex distribution. Patent LIMA to LAD with occluded SVG to RCA and SVG to OM. The RCA gets left-to-right collaterals. 2.  Left ventricular angiography was not performed due to chronic kidney disease.  3.  Right heart catheterization showed high normal filling pressures, moderate pulmonary hypertension and moderately to severely reduced cardiac output.  Wore zio monitor 10/24: Wear time: 13 days, 23 hours.  Min HR: 48BPM, Max 190BPM, average HR of 77BPM.  Predominant rhythm was normal sinus rhythm with first degree AVB.  50 SVT runs with longest interval lasting 20.5s at 111BPM.  Infrequent NSVT  (x2 runs, 5 beats) PVC burden less than 2%.  ROS: All systems negative except as listed in HPI, PMH and Problem List.  SH:  Social History   Socioeconomic History   Marital status: Widowed    Spouse name: Not on file   Number of children: Not on file   Years of education: Not on file   Highest education level: Not on file  Occupational History   Not on file  Tobacco Use   Smoking status: Former   Smokeless tobacco: Never  Vaping Use   Vaping status: Never Used  Substance and Sexual Activity   Alcohol use: No   Drug use: No   Sexual activity: Not on file  Other Topics Concern   Not on file  Social History Narrative   Not on file   Social Drivers of Health   Financial Resource Strain: Low Risk  (11/30/2022)   Overall Financial Resource Strain (CARDIA)    Difficulty of Paying Living Expenses: Not very hard  Food Insecurity: No Food Insecurity (11/30/2022)   Hunger Vital Sign    Worried About Running Out of Food in the Last Year: Never true    Ran Out of Food in the Last Year: Never true  Transportation Needs: No Transportation Needs (11/30/2022)   PRAPARE - Administrator, Civil Service (Medical): No    Lack of Transportation (Non-Medical): No  Physical Activity: Inactive (11/30/2022)   Exercise Vital Sign    Days of Exercise per Week: 0 days    Minutes of Exercise per Session: 0 min  Stress: No Stress Concern Present (11/30/2022)   [employer] of Occupational Health - Occupational Stress Questionnaire    Feeling of Stress : Not at all  Social Connections: Socially Isolated (11/30/2022)   Social Connection and Isolation Panel [NHANES]    Frequency of Communication with Friends and Family: More than three times a week    Frequency of Social Gatherings with Friends and Family: More than three times a week    Attends Religious Services: Never    Database administrator or Organizations: No    Attends Banker Meetings: Never    Marital  Status: Widowed  Intimate Partner Violence: Not At Risk (11/30/2022)   Humiliation, Afraid, Rape, and Kick questionnaire    Fear of Current or Ex-Partner: No    Emotionally Abused: No    Physically Abused: No    Sexually Abused: No    FH: No family history on file.  Past Medical History:  Diagnosis Date   Arthritis    CHF (congestive heart failure) (HCC)    Chronic HFrEF (heart failure with reduced ejection fraction) (HCC)    a. 10/2020 Echo: EF 25-30%, nl RV fxn, mild Benjamin.   CKD (chronic kidney disease), stage III (HCC)    Coronary artery disease    a. 2002 s/p CABG x 2: LIMA->LAD, VG->RPDA; b. 11/2020 Cath: LM 20ost, LAD 1103m, LCX 99p/m, 47m/d, OM1 30, RCA 100p - fills via collats from LAD. RPL1 fills via collats from LCX. VG->PDA 100, LIMA->LAD ok-->med rx.   Gout  Hyperlipidemia LDL goal <70    Hypertension    Ischemic cardiomyopathy    a. 10/2020 Echo: EF 25-30%.   Osteoarthritis     Current Outpatient Medications  Medication Sig Dispense Refill   allopurinol (ZYLOPRIM) 100 MG tablet Take 1 tablet (100 mg total) by mouth daily. 90 tablet 3   atorvastatin (LIPITOR) 40 MG tablet Take 1 tablet (40 mg total) by mouth daily. 90 tablet 2   Blood Pressure Monitoring (ADULT BLOOD PRESSURE CUFF LG) KIT 1 each by Does not apply route as needed (to check blood pressure). 1 kit 0   carvedilol (COREG) 3.125 MG tablet Take 3.125 mg by mouth 2 (two) times daily with a meal.     clopidogrel (PLAVIX) 75 MG tablet Take 1 tablet (75 mg total) by mouth daily. 90 tablet 1   colchicine 0.6 MG tablet Take 2 tabs p.o. x1 then take 1 tab p.o. 1 hour later for gout attack. 20 tablet 0   dapagliflozin propanediol (FARXIGA) 10 MG TABS tablet Take 1 tablet (10 mg total) by mouth daily. 90 tablet 3   ezetimibe (ZETIA) 10 MG tablet Take 1 tablet (10 mg total) by mouth daily. 90 tablet 3   furosemide (LASIX) 20 MG tablet Take 1 tablet (20 mg total) by mouth daily as needed (As needed for weight gain of 2  pounds overnight). 90 tablet 3   hydrALAZINE (APRESOLINE) 50 MG tablet TAKE 1 TABLET BY MOUTH THREE TIMES A DAY (Patient taking differently: Take 50 mg by mouth in the morning and at bedtime.) 270 tablet 2   Iron, Ferrous Sulfate, 325 (65 Fe) MG TABS Take 325 mg by mouth daily. 90 tablet 1   isosorbide mononitrate (IMDUR) 30 MG 24 hr tablet Take 1 tablet (30 mg total) by mouth daily. 90 tablet 3   losartan (COZAAR) 25 MG tablet Take 1 tablet (25 mg total) by mouth daily. 90 tablet 3   No current facility-administered medications for this visit.     PHYSICAL EXAM:  General:  Well appearing. No resp difficulty HEENT: normal Neck: supple. JVP flat. No lymphadenopathy or thryomegaly appreciated. Cor: PMI normal. Regular rate & rhythm. No rubs, gallops or murmurs. Lungs: clear Abdomen: soft, nontender, nondistended. No hepatosplenomegaly. No bruits or masses.  Extremities: no cyanosis, clubbing, rash, trace pitting edema around bilateral ankles Neuro: alert & orientedx3, cranial nerves grossly intact. Moves all 4 extremities w/o difficulty. Affect pleasant.   ECG: not done  ASSESSMENT & PLAN:  1: NICM with preserved ejection fraction- - suspect due to HTN - NYHA class III - euvolemic today - not weighing daily but does have scales; encouraged to resume weighing daily and to call for an overnight weight gain of > 2 pounds or a weekly weight gain of > 5 pounds - weight 213 from last visit here 1 month ago - Echo 11/11/20: EF 25-30% along with mild Benjamin.  - Echo 12/07/22: EF 55% with moderate Benjamin, mild AR (done @ [facility]) - Echo 01/03/23: EF 50-55% with moderate LVH, Grade I DD, mild Benjamin and normal PA pressure of 17.2 mmHg - question whether he needs cMRI or possible repeat cath - not adding salt and friend ("daughter") does most of the cooking and doesn't cook with salt either - continue carvedilol 3.125mg  BID (unable to tolerate metoprolol) - continue farxiga 10mg  daily - continue furosemide  20mg  PRN - continue hydralazine 50mg  BID/ isosorbide MN 30mg  daily - continue losartan 25mg  daily - potassium >5.0 so not a candidate  for MRA - unable to tolerate entresto - proBNT 12/07/22 was 387.0  2: HTN with CKD- - BP  - saw PCP Zane Herald) 12/24 - BMP 01/03/23 reviewed and showed sodium 142, potassium 5.3, creatinine 1.56 and GFR 45 - BMET today - saw nephrology (Kolluru) 11/24  3: CAD- - CABG back in 2002 - saw cardiology Mariah Milling) 12/24 - continue atorvastatin 40mg  daily - continue ASA 81mg  daily - plavix currently on hold due to recent GIB - LDL 09/22/22 was 78 - RHC/LHC done 11/15/20 and showed: Ost LM lesion is 20% stenosed.   Prox Cx to Mid Cx lesion is 99% stenosed.   Mid Cx to Dist Cx lesion is 99% stenosed.   1st Mrg lesion is 30% stenosed.   Mid LAD lesion is 100% stenosed.   Prox RCA lesion is 100% stenosed.   Origin lesion is 100% stenosed.   SVG.   LIMA graft was visualized by angiography and is normal in caliber.   The graft exhibits no disease. 1.  Significant underlying three-vessel coronary artery disease with occluded mid LAD, occluded proximal right coronary artery and subtotal occlusion of the mid left circumflex after the origin of a large high OM1 which supplies the majority of the left circumflex distribution. Patent LIMA to LAD with occluded SVG to RCA and SVG to OM. The RCA gets left-to-right collaterals. 2.  Left ventricular angiography was not performed due to chronic kidney disease. 3.  Right heart catheterization showed high normal filling pressures, moderate pulmonary hypertension and moderately to severely reduced cardiac output.  4: Stroke- - 01/25/22 had cerebral angiogram with intervention for RICA stenosis - has not  had f/u with Dr. Corliss Skains, interventional radiology  - LDL 09/22/22 was 78 - continue atorvastatin 40mg  daily  5: Diabetes- - A1c 09/22/22 was 7.0%3  6: Anemia- - received blood transfusions during 10/24 admission - HG  12/08/22 was 8.2 (on admission it was 6.7) - GI referral placed today for anemia; d/c summary recommends outpatient colonoscopy/ possible EGD - CBC today

## 2023-01-31 ENCOUNTER — Encounter: Payer: 59 | Admitting: Family

## 2023-01-31 NOTE — Telephone Encounter (Signed)
Patient did not show for his [facility] appointment on 01/31/23.

## 2023-02-15 ENCOUNTER — Encounter: Payer: Self-pay | Admitting: Family

## 2023-02-15 ENCOUNTER — Ambulatory Visit: Payer: 59 | Attending: Family | Admitting: Family

## 2023-02-15 VITALS — BP 146/64 | HR 79 | Wt 220.0 lb

## 2023-02-15 DIAGNOSIS — E1122 Type 2 diabetes mellitus with diabetic chronic kidney disease: Secondary | ICD-10-CM | POA: Insufficient documentation

## 2023-02-15 DIAGNOSIS — N183 Chronic kidney disease, stage 3 unspecified: Secondary | ICD-10-CM | POA: Insufficient documentation

## 2023-02-15 DIAGNOSIS — I5032 Chronic diastolic (congestive) heart failure: Secondary | ICD-10-CM

## 2023-02-15 DIAGNOSIS — Z87891 Personal history of nicotine dependence: Secondary | ICD-10-CM | POA: Insufficient documentation

## 2023-02-15 DIAGNOSIS — I2510 Atherosclerotic heart disease of native coronary artery without angina pectoris: Secondary | ICD-10-CM | POA: Diagnosis not present

## 2023-02-15 DIAGNOSIS — Z79899 Other long term (current) drug therapy: Secondary | ICD-10-CM | POA: Diagnosis not present

## 2023-02-15 DIAGNOSIS — I130 Hypertensive heart and chronic kidney disease with heart failure and stage 1 through stage 4 chronic kidney disease, or unspecified chronic kidney disease: Secondary | ICD-10-CM | POA: Insufficient documentation

## 2023-02-15 DIAGNOSIS — I272 Pulmonary hypertension, unspecified: Secondary | ICD-10-CM | POA: Diagnosis not present

## 2023-02-15 DIAGNOSIS — D649 Anemia, unspecified: Secondary | ICD-10-CM | POA: Insufficient documentation

## 2023-02-15 DIAGNOSIS — M1990 Unspecified osteoarthritis, unspecified site: Secondary | ICD-10-CM | POA: Insufficient documentation

## 2023-02-15 DIAGNOSIS — Z7902 Long term (current) use of antithrombotics/antiplatelets: Secondary | ICD-10-CM | POA: Insufficient documentation

## 2023-02-15 DIAGNOSIS — I639 Cerebral infarction, unspecified: Secondary | ICD-10-CM | POA: Diagnosis not present

## 2023-02-15 DIAGNOSIS — I10 Essential (primary) hypertension: Secondary | ICD-10-CM

## 2023-02-15 DIAGNOSIS — M109 Gout, unspecified: Secondary | ICD-10-CM | POA: Insufficient documentation

## 2023-02-15 DIAGNOSIS — I5022 Chronic systolic (congestive) heart failure: Secondary | ICD-10-CM | POA: Insufficient documentation

## 2023-02-15 DIAGNOSIS — N1832 Chronic kidney disease, stage 3b: Secondary | ICD-10-CM

## 2023-02-15 DIAGNOSIS — I25810 Atherosclerosis of coronary artery bypass graft(s) without angina pectoris: Secondary | ICD-10-CM | POA: Diagnosis not present

## 2023-02-15 DIAGNOSIS — I428 Other cardiomyopathies: Secondary | ICD-10-CM | POA: Insufficient documentation

## 2023-02-15 DIAGNOSIS — Z8673 Personal history of transient ischemic attack (TIA), and cerebral infarction without residual deficits: Secondary | ICD-10-CM | POA: Insufficient documentation

## 2023-02-15 DIAGNOSIS — E785 Hyperlipidemia, unspecified: Secondary | ICD-10-CM | POA: Insufficient documentation

## 2023-02-15 NOTE — Patient Instructions (Signed)
 Go DOWN to LOWER LEVEL (LL) to have your blood work completed inside of [employer] office.  We will only call you if the results are abnormal or if the provider would like to make medication changes.

## 2023-02-15 NOTE — Progress Notes (Signed)
 PCP: Gareth Clarity, [name] (last seen 12/24) Primary Cardiologist: Perla Lye, MD (last seen 12/24)  Chief Complaint: fatigue  HPI:  Benjamin Valentine is a 78 y/o male with a history of CAD (CABG 2022), HTN, melena, anemia, hyperlipidemia, CKD, arthritis, gout, stroke (10/23), carotid stenosis (angiogram 12/23), previous tobacco use and chronic heart failure.   Admitted 11/11/2020 for elevated tropinin. He presented to the hospital with increasing SOB along with ABD distention, and orthopena. Initial HStrop 59, EKG showed T wave inversion in the inferolateral leads. Echo showed decreased LV function with EF 25-30%, mid-mod LV dilation, mild Benjamin. Underwent R/L HC which showed significant 3-vessel disease with patent LIMA to LAD with occluded SVG to RCA and SVG to OM. RHC showed high normal filling pressure, moderate pulmonary hypertension, and mod to severe reduced CO. He was diuresed with lasix  but had increase creatinine above baseline. Admitted back to the hospital on 11/25/20 after presenting with new onset left arm and left knee numbness and weakness. Stroke workup was performed and was negative. He was seen by neurology and was thought symptoms were due to TIA.   Was in the ED 06/01/21 due to dizziness, headache and chest pain. Accidentally took a double dose of his medications today. Symptoms improved and he was released. Was in the ED 08/13/21 due to left wrist gout where he was evaluated and released. Was in the ED 10/23/21 due to left wrist gout where he was treated and released.   Admitted 11/24/21 due to slurred speech and facial droop. Code stroke initiated on arrival. Low NIH, not a candidate for TNK. Discussed with neurology who recommends hospitalization for further stroke work-up. MRI is positive for acute infarct. CTA H&N showed severe multifocal stenosis. Held coreg , Entresto , Lasix , hydralazine  and Imdur  for permissive HTN, to be resumed after discharge. 01/25/22 had cerebral angiogram with  intervention for RICA stenosis. Admitted 12/06/22 due to SOB, fatigue, dizziness and found to have acute blood loss. Admission hemoglobin was 6.7. IV PPI given along with blood transfusions. Plavix  held. IV iron  given. To have outpatient GI appt for colonoscopy / possible EGD and to address resumption of plavix .   Echo 11/11/20: EF 25-30% along with mild Benjamin.  Echo 12/07/22: EF 55% with moderate Benjamin, mild AR Echo 01/03/23: EF 50-55% with moderate LVH, Grade I DD, mild Benjamin and normal PA pressure of 17.2 mmHg  He presents today for a HF follow-up visit with a chief complaint of moderate fatigue with little exertion. Chronic in nature. Has associated shortness of breath along with this. Denies chest pain, cough, palpitations, abdominal distention, pedal edema, dizziness or difficulty sleeping. Reports having a good appetite and admits that over the holidays, he's probably eaten more sodium then usual.   At last visit, he was told to take his furosemide  for 2 days due to K+ >5.0. Took the 2 days of furosemide  but hasn't taken it since. Has upcoming GI appointment later this month.   Previous cardiac studies:  RHC/LHC done 11/15/20 and showed: Ost LM lesion is 20% stenosed.   Prox Cx to Mid Cx lesion is 99% stenosed.   Mid Cx to Dist Cx lesion is 99% stenosed.   1st Mrg lesion is 30% stenosed.   Mid LAD lesion is 100% stenosed.   Prox RCA lesion is 100% stenosed.   Origin lesion is 100% stenosed.   SVG.   LIMA graft was visualized by angiography and is normal in caliber.   The graft exhibits no disease. 1.  Significant  underlying three-vessel coronary artery disease with occluded mid LAD, occluded proximal right coronary artery and subtotal occlusion of the mid left circumflex after the origin of a large high OM1 which supplies the majority of the left circumflex distribution. Patent LIMA to LAD with occluded SVG to RCA and SVG to OM. The RCA gets left-to-right collaterals. 2.  Left ventricular  angiography was not performed due to chronic kidney disease. 3.  Right heart catheterization showed high normal filling pressures, moderate pulmonary hypertension and moderately to severely reduced cardiac output.  Wore zio monitor 10/24: Wear time: 13 days, 23 hours.  Min HR: 48BPM, Max 190BPM, average HR of 77BPM.  Predominant rhythm was normal sinus rhythm with first degree AVB.  50 SVT runs with longest interval lasting 20.5s at 111BPM.  Infrequent NSVT (x2 runs, 5 beats) PVC burden less than 2%.  ROS: All systems negative except as listed in HPI, PMH and Problem List.  SH:  Social History   Socioeconomic History   Marital status: Widowed    Spouse name: Not on file   Number of children: Not on file   Years of education: Not on file   Highest education level: Not on file  Occupational History   Not on file  Tobacco Use   Smoking status: Former   Smokeless tobacco: Never  Vaping Use   Vaping status: Never Used  Substance and Sexual Activity   Alcohol use: No   Drug use: No   Sexual activity: Not on file  Other Topics Concern   Not on file  Social History Narrative   Not on file   Social Drivers of Health   Financial Resource Strain: Low Risk  (11/30/2022)   Overall Financial Resource Strain (CARDIA)    Difficulty of Paying Living Expenses: Not very hard  Food Insecurity: No Food Insecurity (11/30/2022)   Hunger Vital Sign    Worried About Running Out of Food in the Last Year: Never true    Ran Out of Food in the Last Year: Never true  Transportation Needs: No Transportation Needs (11/30/2022)   PRAPARE - Administrator, Civil Service (Medical): No    Lack of Transportation (Non-Medical): No  Physical Activity: Inactive (11/30/2022)   Exercise Vital Sign    Days of Exercise per Week: 0 days    Minutes of Exercise per Session: 0 min  Stress: No Stress Concern Present (11/30/2022)   Benjamin Valentine [employer] - Occupational Stress  Questionnaire    Feeling of Stress : Not at all  Social Connections: Socially Isolated (11/30/2022)   Social Connection and Isolation Panel [NHANES]    Frequency of Communication with Friends and Family: More than three times a week    Frequency of Social Gatherings with Friends and Family: More than three times a week    Attends Religious Services: Never    Database Administrator or Organizations: No    Attends Banker Meetings: Never    Marital Status: Widowed  Intimate Partner Violence: Not At Risk (11/30/2022)   Humiliation, Afraid, Rape, and Kick questionnaire    Fear of Current or Ex-Partner: No    Emotionally Abused: No    Physically Abused: No    Sexually Abused: No    FH: No family history on file.  Past Medical History:  Diagnosis Date   Arthritis    CHF (congestive heart failure) (HCC)    Chronic HFrEF (heart failure with reduced ejection fraction) (HCC)  a. 10/2020 Echo: EF 25-30%, nl RV fxn, mild Benjamin.   CKD (chronic kidney disease), stage III (HCC)    Coronary artery disease    a. 2002 s/p CABG x 2: LIMA->LAD, VG->RPDA; b. 11/2020 Cath: LM 20ost, LAD 150m, LCX 99p/m, 60m/d, OM1 30, RCA 100p - fills via collats from LAD. RPL1 fills via collats from LCX. VG->PDA 100, LIMA->LAD ok-->med rx.   Gout    Hyperlipidemia LDL goal <70    Hypertension    Ischemic cardiomyopathy    a. 10/2020 Echo: EF 25-30%.   Osteoarthritis     Current Outpatient Medications  Medication Sig Dispense Refill   allopurinol  (ZYLOPRIM ) 100 MG tablet Take 1 tablet (100 mg total) by mouth daily. 90 tablet 3   atorvastatin  (LIPITOR) 40 MG tablet Take 1 tablet (40 mg total) by mouth daily. 90 tablet 2   Blood Pressure Monitoring (ADULT BLOOD PRESSURE CUFF LG) KIT 1 each by Does not apply route as needed (to check blood pressure). 1 kit 0   carvedilol  (COREG ) 3.125 MG tablet Take 3.125 mg by mouth 2 (two) times daily with a meal.     clopidogrel  (PLAVIX ) 75 MG tablet Take 1 tablet (75  mg total) by mouth daily. 90 tablet 1   colchicine  0.6 MG tablet Take 2 tabs p.o. x1 then take 1 tab p.o. 1 hour later for gout attack. 20 tablet 0   dapagliflozin  propanediol (FARXIGA ) 10 MG TABS tablet Take 1 tablet (10 mg total) by mouth daily. 90 tablet 3   ezetimibe  (ZETIA ) 10 MG tablet Take 1 tablet (10 mg total) by mouth daily. 90 tablet 3   furosemide  (LASIX ) 20 MG tablet Take 1 tablet (20 mg total) by mouth daily as needed (As needed for weight gain of 2 pounds overnight). 90 tablet 3   hydrALAZINE  (APRESOLINE ) 50 MG tablet TAKE 1 TABLET BY MOUTH THREE TIMES A DAY (Patient taking differently: Take 50 mg by mouth in the morning and at bedtime.) 270 tablet 2   Iron , Ferrous Sulfate , 325 (65 Fe) MG TABS Take 325 mg by mouth daily. 90 tablet 1   isosorbide  mononitrate (IMDUR ) 30 MG 24 hr tablet Take 1 tablet (30 mg total) by mouth daily. 90 tablet 3   losartan  (COZAAR ) 25 MG tablet Take 1 tablet (25 mg total) by mouth daily. 90 tablet 3   No current facility-administered medications for this visit.   Vitals:   02/15/23 1343  BP: (!) 146/64  Pulse: 79  SpO2: 97%  Weight: 220 lb (99.8 kg)   Wt Readings from Last 3 Encounters:  02/15/23 220 lb (99.8 kg)  01/29/23 210 lb 9.6 oz (95.5 kg)  01/16/23 213 lb 5 oz (96.8 kg)   Lab Results  Component Value Date   CREATININE 1.56 (H) 01/03/2023   CREATININE 1.81 (H) 11/17/2022   CREATININE 1.79 (H) 09/22/2022   PHYSICAL EXAM:  General:  Well appearing. No resp difficulty HEENT: normal Neck: supple. JVP flat. No lymphadenopathy or thryomegaly appreciated. Cor: PMI normal. Regular rate & rhythm. No rubs, gallops or murmurs. Lungs: clear Abdomen: soft, nontender, nondistended. No hepatosplenomegaly. No bruits or masses.  Extremities: no cyanosis, clubbing, rash, trace pitting edema in bilateral lower legs Neuro: alert & orientedx3, cranial nerves grossly intact. Moves all 4 extremities w/o difficulty. Affect pleasant.   ECG: not  done   ASSESSMENT & PLAN:  1: NICM with preserved ejection fraction- - suspect due to HTN - NYHA class III - euvolemic today - not weighing  daily but does have scales; encouraged to resume weighing daily and to call for an overnight weight gain of > 2 pounds or a weekly weight gain of > 5 pounds - weight up 7 pounds from last visit here 6 weeks ago - Echo 11/11/20: EF 25-30% along with mild Benjamin.  - Echo 12/07/22: EF 55% with moderate Benjamin, mild AR (done @ [facility]) - Echo 01/03/23: EF 50-55% with moderate LVH, Grade I DD, mild Benjamin and normal PA pressure of 17.2 mmHg - question whether he needs cMRI or possible repeat cath - not adding salt and friend (daughter) does most of the cooking and doesn't cook with salt either - continue carvedilol  3.125mg  BID (unable to tolerate metoprolol ) - continue farxiga  10mg  daily - continue furosemide  20mg  PRN; has not taken in several weeks - continue hydralazine  50mg  BID/ isosorbide  MN 30mg  daily - continue losartan  25mg  daily - potassium >5.0 so not a candidate for MRA - BMET today - unable to tolerate entresto  - proBNT 12/07/22 was 387.0  2: HTN with CKD- - BP 146/64 - saw PCP Benjamin Valentine) 12/24 - BMP 01/03/23 reviewed and showed sodium 142, potassium 5.3, creatinine 1.56 and GFR 45 - BMET today - saw nephrology (Kolluru) 11/24  3: CAD- - CABG back in 2002 - saw cardiology Benjamin Valentine) 12/24 - continue atorvastatin  40mg  daily - continue ezetimibe  10mg  daily - continue clopidogrel  75mg  daily - LDL 09/22/22 was 78 - RHC/LHC done 11/15/20 and showed: Ost LM lesion is 20% stenosed.   Prox Cx to Mid Cx lesion is 99% stenosed.   Mid Cx to Dist Cx lesion is 99% stenosed.   1st Mrg lesion is 30% stenosed.   Mid LAD lesion is 100% stenosed.   Prox RCA lesion is 100% stenosed.   Origin lesion is 100% stenosed.   SVG.   LIMA graft was visualized by angiography and is normal in caliber.   The graft exhibits no disease. 1.  Significant underlying three-vessel  coronary artery disease with occluded mid LAD, occluded proximal right coronary artery and subtotal occlusion of the mid left circumflex after the origin of a large high OM1 which supplies the majority of the left circumflex distribution. Patent LIMA to LAD with occluded SVG to RCA and SVG to OM. The RCA gets left-to-right collaterals. 2.  Left ventricular angiography was not performed due to chronic kidney disease. 3.  Right heart catheterization showed high normal filling pressures, moderate pulmonary hypertension and moderately to severely reduced cardiac output.  4: Stroke- - 01/25/22 had cerebral angiogram with intervention for RICA stenosis - has not  had f/u with Dr. Dolphus, interventional radiology  - LDL 09/22/22 was 78 - continue atorvastatin  40mg  daily/ zetia  10mg  daily  5: Diabetes- - A1c 01/29/23 was 5.7%  6: Anemia- - received blood transfusions during 10/24 admission - HG 01/03/23 was 10.8 - has upcoming GI appt 03/15/23   Return in 6 months, sooner if needed.

## 2023-02-16 LAB — BASIC METABOLIC PANEL
BUN/Creatinine Ratio: 12 (ref 10–24)
BUN: 17 mg/dL (ref 8–27)
CO2: 23 mmol/L (ref 20–29)
Calcium: 9 mg/dL (ref 8.6–10.2)
Chloride: 105 mmol/L (ref 96–106)
Creatinine, Ser: 1.47 mg/dL — ABNORMAL HIGH (ref 0.76–1.27)
Glucose: 117 mg/dL — ABNORMAL HIGH (ref 70–99)
Potassium: 4.3 mmol/L (ref 3.5–5.2)
Sodium: 142 mmol/L (ref 134–144)
eGFR: 49 mL/min/{1.73_m2} — ABNORMAL LOW (ref >59)

## 2023-03-15 ENCOUNTER — Encounter: Payer: Self-pay | Admitting: Physician Assistant

## 2023-03-15 ENCOUNTER — Other Ambulatory Visit: Payer: Self-pay | Admitting: Physician Assistant

## 2023-03-15 ENCOUNTER — Ambulatory Visit (INDEPENDENT_AMBULATORY_CARE_PROVIDER_SITE_OTHER): Payer: 59 | Admitting: Physician Assistant

## 2023-03-15 ENCOUNTER — Telehealth: Payer: Self-pay | Admitting: Cardiovascular Disease

## 2023-03-15 VITALS — BP 122/70 | HR 69 | Temp 97.6°F | Ht 69.0 in | Wt 219.8 lb

## 2023-03-15 DIAGNOSIS — D500 Iron deficiency anemia secondary to blood loss (chronic): Secondary | ICD-10-CM | POA: Diagnosis not present

## 2023-03-15 DIAGNOSIS — N189 Chronic kidney disease, unspecified: Secondary | ICD-10-CM

## 2023-03-15 DIAGNOSIS — Z87891 Personal history of nicotine dependence: Secondary | ICD-10-CM | POA: Diagnosis not present

## 2023-03-15 DIAGNOSIS — I2510 Atherosclerotic heart disease of native coronary artery without angina pectoris: Secondary | ICD-10-CM

## 2023-03-15 DIAGNOSIS — D509 Iron deficiency anemia, unspecified: Secondary | ICD-10-CM | POA: Diagnosis not present

## 2023-03-15 DIAGNOSIS — E538 Deficiency of other specified B group vitamins: Secondary | ICD-10-CM | POA: Diagnosis not present

## 2023-03-15 DIAGNOSIS — D631 Anemia in chronic kidney disease: Secondary | ICD-10-CM

## 2023-03-15 MED ORDER — PEG 3350-KCL-NA BICARB-NACL 420 G PO SOLR: 4000.0000 mL | Freq: Once | ORAL | 0 refills | Status: AC

## 2023-03-15 NOTE — Telephone Encounter (Signed)
Dr. Mariah Milling,  Benjamin Valentine 78 year old male is requesting preoperative cardiac evaluation for colonoscopy and EGD.  Procedure is scheduled for 04/04/2023.  He was recently seen in the clinic on 01/16/2023.  He denied chest discomfort and was euvolemic at that time.  His blood pressure was well-controlled.  Please advise on holding Plavix for his upcoming procedures.   Thank you for your help.  Please direct your response to [facility] preop pool.  Benjamin Valentine. Benjamin Calbert NP-C     03/15/2023, 3:37 PM [facility] [address] Office [phone] Fax [phone]

## 2023-03-15 NOTE — Telephone Encounter (Signed)
   Pre-operative Risk Assessment    Patient Name: Benjamin Valentine  DOB: 03-18-1945 MRN: [id number]   Date of last office visit: 01/16/23 - Dr Mariah Milling Date of next office visit: N/A   Request for Surgical Clearance    Procedure:   colonoscopy and EGD  Date of Surgery:  Clearance 04/04/23                                Surgeon:  not indicated  Surgeon's Group or Practice Name:  [facility] Phone number:  [phone] Fax number:  [phone]   Type of Clearance Requested:   - Pharmacy:  Hold Clopidogrel (Plavix) instructions   Type of Anesthesia:  None    Additional requests/questions:    Courtney Heys   03/15/2023, 2:37 PM

## 2023-03-15 NOTE — Progress Notes (Signed)
Celso Amy, [name] [address]  [address]  [place], [place] [address]  Main: [phone]  Fax: [phone]   Gastroenterology Consultation  Referring Provider:     Berniece Salines, [name] Primary Care Physician:  Berniece Salines, [name] Primary Gastroenterologist:  Celso Amy, [name] / Dr. Wyline Mood   Reason for Consultation:     Anemia        HPI:   Benjamin Valentine is a 78 y.o. y/o male referred for consultation & management  by Berniece Salines, [name].  Here to evaluate anemia.  Patient has never had a colonoscopy, EGD, or GI evaluation.  He denies NSAID or aspirin use.  He is taking Plavix daily.  He denies any GI symptoms such as abdominal pain, heartburn, dysphagia, diarrhea, constipation, melena, hematochezia, or weight loss.  No family history of GI malignancies.  He is here today with his daughter-in-law Chip Boer who helps with his care.  Hx CKD: BUN 26, creatinine 1.56, GFR 45 (12/2022). History of B12 deficiency:  Vitamin B12 was 183 in October 2024.  Normal folate. History of iron deficiency: Total iron 28, iron saturation 6% (09/2022).  He received blood transfusion during 11/2022 hospital admission.  Ref Range & Units (hover) 2 mo ago (01/03/23) 5 mo ago (09/22/22) 1 yr ago (01/26/22) 1 yr ago (01/25/22) 1 yr ago (01/03/22) 1 yr ago (11/25/21) 1 yr ago (11/24/21)  WBC 7.1 7.6 R 15.3 High  R 6.7 R 6.7 R 7.5 R 9.1 R  Comment: **Effective January 15, 2023 profile 005025 WBC will be made**   non-orderable as a stand-alone order code.  RBC 4.53 4.73 R 4.30 R 4.49 R 5.00 R 4.65 R 4.74 R  Hemoglobin 10.8 Low  9.9 Low  R 10.6 Low  R 11.2 Low  R 12.4 Low  R 11.7 Low  R 12.1 Low  R  Hematocrit 37.7 34.8 Low  R 34.3 Low  R 36.4 Low  R 41.5 R 38.1 Low  R 39.5 R  MCV 83 73.6 Low  R 79.8 Low  R 81.1 R 83.0 R 81.9 R 83.3 R  MCH 23.8 Low  20.9 Low  R 24.7 Low  R 24.9 Low  R 24.8 Low  R 25.2 Low  R 25.5 Low  R  MCHC 28.6 Low  28.4 Low  R 30.9 R 30.8 R 29.9 Low  R 30.7 R 30.6 R  RDW  22.4 High  18.4 High  R 16.7 High  R 16.7 High  R 16.3 High  R 16.4 High  R 16.2 High  R  Platelets 363 490 High  R 417 High  R 380 R 379 R 342 R 373 R   No previous GI evaluation, EGD, or colonoscopy.  Medical history significant for CHF, CKD, CAD with stents and CABG (2022), diabetes, hypertension, history of CVA (11/2021), COPD, gout, tobacco dependence.  Currently taking ferrous sulfate 325 mg iron daily.  Also takes Plavix daily.  Not on a PPI.  Echo 12/2022 showed LVEF 50 to 55%.  Last cardiology follow-up 02/15/2023.  Cardiologist Dr. Mariah Milling.  Also followed by nephrology.  RUQ ultrasound 11/2022 showed distended gallbladder with gallstones and sludge.  Mildly enlarged liver with mild nodular contour.  No hepatic lesions.  CBD 0.29 cm.  Concerns about chronic liver disease.   Past Medical History:  Diagnosis Date   Arthritis    CHF (congestive heart failure) (HCC)    Chronic HFrEF (heart failure with reduced ejection  fraction) (HCC)    a. 10/2020 Echo: EF 25-30%, nl RV fxn, mild MR.   CKD (chronic kidney disease), stage III (HCC)    Coronary artery disease    a. 2002 s/p CABG x 2: LIMA->LAD, VG->RPDA; b. 11/2020 Cath: LM 20ost, LAD 121m, LCX 99p/m, 64m/d, OM1 30, RCA 100p - fills via collats from LAD. RPL1 fills via collats from LCX. VG->PDA 100, LIMA->LAD ok-->med rx.   Gout    Hyperlipidemia LDL goal <70    Hypertension    Ischemic cardiomyopathy    a. 10/2020 Echo: EF 25-30%.   Osteoarthritis     Past Surgical History:  Procedure Laterality Date   CARDIAC CATHETERIZATION     CARDIAC SURGERY     CABG 2002   IR ANGIO EXTRACRAN SEL COM CAROTID INNOMINATE UNI BILAT MOD SED  01/03/2022   IR ANGIO INTRA EXTRACRAN SEL INTERNAL CAROTID UNI R MOD SED  01/29/2022   IR ANGIO VERTEBRAL SEL VERTEBRAL UNI L MOD SED  01/03/2022   IR CT HEAD LTD  01/29/2022   IR INTRA CRAN STENT  01/25/2022   IR RADIOLOGIST EVAL & MGMT  12/21/2021   IR US GUIDE VASC ACCESS RIGHT  01/03/2022   IR US GUIDE  VASC ACCESS RIGHT  01/25/2022   LAPAROSCOPIC APPENDECTOMY N/A 08/11/2018   Procedure: APPENDECTOMY LAPAROSCOPIC;  Surgeon: Henrene Dodge, MD;  Location: [facility] ORS;  Service: General;  Laterality: N/A;   RADIOLOGY WITH ANESTHESIA N/A 01/25/2022   Procedure: Cerebral angioplasty with possible stenting;  Surgeon: Julieanne Cotton, MD;  Location: [facility] OR;  Service: Radiology;  Laterality: N/A;   RIGHT/LEFT HEART CATH AND CORONARY/GRAFT ANGIOGRAPHY N/A 11/15/2020   Procedure: RIGHT/LEFT HEART CATH AND CORONARY/GRAFT ANGIOGRAPHY;  Surgeon: Iran Ouch, MD;  Location: [facility] INVASIVE CV LAB;  Service: Cardiovascular;  Laterality: N/A;    Prior to Admission medications   Medication Sig Start Date End Date Taking? Authorizing Provider  allopurinol (ZYLOPRIM) 100 MG tablet Take 1 tablet (100 mg total) by mouth daily. 06/29/21   Corky Downs, MD  atorvastatin (LIPITOR) 40 MG tablet Take 1 tablet (40 mg total) by mouth daily. 11/16/22   Berniece Salines, [name]  Blood Pressure Monitoring (ADULT BLOOD PRESSURE CUFF LG) KIT 1 each by Does not apply route as needed (to check blood pressure). 01/29/23   Berniece Salines, [name]  carvedilol (COREG) 3.125 MG tablet Take 3.125 mg by mouth 2 (two) times daily with a meal.    [provider]  clopidogrel (PLAVIX) 75 MG tablet Take 1 tablet (75 mg total) by mouth daily. 11/16/22   Berniece Salines, [name]  colchicine 0.6 MG tablet Take 2 tabs p.o. x1 then take 1 tab p.o. 1 hour later for gout attack. 01/16/23   Antonieta Iba, MD  dapagliflozin propanediol (FARXIGA) 10 MG TABS tablet Take 1 tablet (10 mg total) by mouth daily. 11/16/22   Berniece Salines, [name]  ezetimibe (ZETIA) 10 MG tablet Take 1 tablet (10 mg total) by mouth daily. 01/16/23 04/16/23  Antonieta Iba, MD  furosemide (LASIX) 20 MG tablet Take 1 tablet (20 mg total) by mouth daily as needed (As needed for weight gain of 2 pounds overnight). 09/22/22   Berniece Salines, [name]  hydrALAZINE (APRESOLINE) 50 MG  tablet TAKE 1 TABLET BY MOUTH THREE TIMES A DAY Patient taking differently: Take 50 mg by mouth in the morning and at bedtime. 01/31/22   Corky Downs, MD  Iron, Ferrous Sulfate, 325 (65 Fe) MG TABS  Take 325 mg by mouth daily. 09/27/22   Berniece Salines, [name]  isosorbide mononitrate (IMDUR) 30 MG 24 hr tablet Take 1 tablet (30 mg total) by mouth daily. 09/22/22   Berniece Salines, [name]  losartan (COZAAR) 25 MG tablet Take 1 tablet (25 mg total) by mouth daily. 11/17/22 02/15/23  Delma Freeze, [name]    History reviewed. No pertinent family history.   Social History   Tobacco Use   Smoking status: Former   Smokeless tobacco: Never  Vaping Use   Vaping status: Never Used  Substance Use Topics   Alcohol use: No   Drug use: No    Allergies as of 03/15/2023 - Review Complete 03/15/2023  Allergen Reaction Noted   Entresto [sacubitril-valsartan]  01/20/2022   Metoprolol Other (See Comments) 12/06/2022    Review of Systems:    All systems reviewed and negative except where noted in HPI.   Physical Exam:  BP 122/70   Pulse 69   Temp 97.6 F (36.4 C)   Ht 5\' 9"  (1.753 m)   Wt 219 lb 12.8 oz (99.7 kg)   BMI 32.46 kg/m  No LMP for male patient.  General:   Alert,  Well-developed, well-nourished, pleasant and cooperative in NAD Lungs:  Respirations even and unlabored.  Clear throughout to auscultation.   No wheezes, crackles, or rhonchi. No acute distress. Heart:  Regular rate and rhythm; no murmurs, clicks, rubs, or gallops. Abdomen:  Normal bowel sounds.  No bruits.  Soft, and non-distended without masses, hepatosplenomegaly or hernias noted.  No Tenderness.  No guarding or rebound tenderness.    Neurologic:  Alert and oriented x3;  grossly normal neurologically. Psych:  Alert and cooperative. Normal mood and affect.  Imaging Studies: No results found.  Assessment and Plan:   Benjamin Valentine is a 78 y.o. y/o male has been referred for anemia which is likely multifactorial, due to  iron deficiency, B12 deficiency, and CKD.  He denies any GI symptoms.  No previous EGD, colonoscopy, or GI evaluation.  1.  Iron deficiency anemia  Labs: CBC, iron panel, ferritin,   Scheduling EGD & Colonoscopy I discussed risks of EGD and colonoscopy with patient to include risk of bleeding, perforation, and risk of sedation.  Patient expressed understanding and agrees to proceed with procedures.    Request cardiac clearance and permission to hold Plavix 5 days prior to EGD and colonoscopy.  2.  B12 deficiency  Lab: B12  3.  Anemia of chronic kidney disease - Stable  Followed by Nephrology  4.  CAD - Stable  Get cardiac clearance for EGD and colonoscopy.  Get permission to hold Plavix 5 days prior to EGD and colonoscopy.  Follow up 4 weeks after EGD and colonoscopy.  Celso Amy, [name]

## 2023-03-16 ENCOUNTER — Telehealth: Payer: Self-pay

## 2023-03-16 LAB — CBC WITH DIFFERENTIAL/PLATELET
Basophils Absolute: 0 10*3/uL (ref 0.0–0.2)
Basos: 0 %
EOS (ABSOLUTE): 0.2 10*3/uL (ref 0.0–0.4)
Eos: 3 %
Hematocrit: 40.5 % (ref 37.5–51.0)
Hemoglobin: 12.3 g/dL — ABNORMAL LOW (ref 13.0–17.7)
Immature Grans (Abs): 0 10*3/uL (ref 0.0–0.1)
Immature Granulocytes: 0 %
Lymphocytes Absolute: 2.1 10*3/uL (ref 0.7–3.1)
Lymphs: 28 %
MCH: 26.2 pg — ABNORMAL LOW (ref 26.6–33.0)
MCHC: 30.4 g/dL — ABNORMAL LOW (ref 31.5–35.7)
MCV: 86 fL (ref 79–97)
Monocytes Absolute: 0.9 10*3/uL (ref 0.1–0.9)
Monocytes: 12 %
Neutrophils Absolute: 4.4 10*3/uL (ref 1.4–7.0)
Neutrophils: 57 %
Platelets: 358 10*3/uL (ref 150–450)
RBC: 4.7 x10E6/uL (ref 4.14–5.80)
RDW: 16.6 % — ABNORMAL HIGH (ref 11.6–15.4)
WBC: 7.7 10*3/uL (ref 3.4–10.8)

## 2023-03-16 LAB — IRON,TIBC AND FERRITIN PANEL
Ferritin: 20 ng/mL — ABNORMAL LOW (ref 30–400)
Iron Saturation: 14 % — ABNORMAL LOW (ref 15–55)
Iron: 51 ug/dL (ref 38–169)
Total Iron Binding Capacity: 375 ug/dL (ref 250–450)
UIBC: 324 ug/dL (ref 111–343)

## 2023-03-16 LAB — VITAMIN B12: Vitamin B-12: 272 pg/mL (ref 232–1245)

## 2023-03-16 NOTE — Telephone Encounter (Signed)
Patient notified- he will start B-12 .  Call and notify patient his hemoglobin has improved from 10.8-12.3.  Vitamin B12 is low normal.  Iron has improved, yet still a little bit low.  Continue iron and B12 supplements.  Continue with plan for EGD and colonoscopy as scheduled.  Celso Amy, [name]

## 2023-03-16 NOTE — Progress Notes (Signed)
Call and notify patient his hemoglobin has improved from 10.8-12.3.  Vitamin B12 is low normal.  Iron has improved, yet still a little bit low.  Continue iron and B12 supplements.  Continue with plan for EGD and colonoscopy as scheduled. Celso Amy, [name]

## 2023-03-16 NOTE — Telephone Encounter (Signed)
   Patient Name: Benjamin Valentine  DOB: 06-Dec-1945 MRN: [id number]  Primary Cardiologist: Julien Nordmann, MD  Chart reviewed as part of pre-operative protocol coverage. Given past medical history and time since last visit, based on ACC/AHA guidelines, LATHANIEL LEGATE is at acceptable risk for the planned procedure without further cardiovascular testing.   Per Dr. Mariah Milling, "Acceptable risk for procedure. I do not think he is on Plavix this was held at [facility] October 2024. Continue aspirin 81. Thx TGollan"  I will route this recommendation to the requesting party via Epic fax function and remove from pre-op pool.  Please call with questions.  Joylene Grapes, NP 03/16/2023, 2:20 PM

## 2023-03-19 ENCOUNTER — Telehealth: Payer: Self-pay

## 2023-03-19 NOTE — Telephone Encounter (Signed)
Per Dr.Gollan --patient acceptable risk for the planned procedure without further cardiovascular testing- I do not think he is on Plavix this was held at [facility] October 2024- continue 81 mg aspirin

## 2023-04-02 ENCOUNTER — Telehealth: Payer: Self-pay

## 2023-04-02 NOTE — Telephone Encounter (Signed)
Pt's daughter has requested for her father to have only an EGD not Colonoscopy.  She does not feel like he will be able to tolerate the colonoscopy procedure.  Informed Vicky I was calling to reschedule procedures anyway from 04/04/23 due to schedule change with Dr. Tobi Bastos.  She said she will need to call us back to reschedule due to her being in the hospital with her husband (Pts son) right now.  She will call back to reschedule with me later on this week.  Trish in Endo notified of procedure cancellation.  Thanks,  [place], [place]

## 2023-04-04 ENCOUNTER — Ambulatory Visit: Admission: RE | Admit: 2023-04-04 | Payer: 59 | Source: Home / Self Care | Admitting: Gastroenterology

## 2023-04-04 SURGERY — COLONOSCOPY WITH PROPOFOL
Anesthesia: General

## 2023-04-30 DIAGNOSIS — D631 Anemia in chronic kidney disease: Secondary | ICD-10-CM | POA: Diagnosis not present

## 2023-04-30 DIAGNOSIS — N1832 Chronic kidney disease, stage 3b: Secondary | ICD-10-CM | POA: Diagnosis not present

## 2023-04-30 DIAGNOSIS — E1122 Type 2 diabetes mellitus with diabetic chronic kidney disease: Secondary | ICD-10-CM | POA: Diagnosis not present

## 2023-04-30 DIAGNOSIS — E785 Hyperlipidemia, unspecified: Secondary | ICD-10-CM | POA: Diagnosis not present

## 2023-04-30 DIAGNOSIS — E875 Hyperkalemia: Secondary | ICD-10-CM | POA: Diagnosis not present

## 2023-04-30 DIAGNOSIS — I129 Hypertensive chronic kidney disease with stage 1 through stage 4 chronic kidney disease, or unspecified chronic kidney disease: Secondary | ICD-10-CM | POA: Diagnosis not present

## 2023-05-01 NOTE — Progress Notes (Deleted)
 Celso Amy, [name] [address]  [address]  [place], [place] [address]  Main: [phone]  Fax: [phone]   Primary Care Physician: Berniece Salines, [name]  Primary Gastroenterologist:  ***  CC: Follow-up anemia  HPI: Benjamin Valentine is a 78 y.o. male returns for 6-week follow-up of iron deficiency and B12 deficiency anemia.  Also has anemia of chronic kidney disease, followed by nephrology.  History of CAD and takes Plavix.  He received blood transfusion during 11/2022 hospital admission.  He has never had an EGD or colonoscopy.   Hemoglobin has ranged between 9.9 and 10.8 from August 2024 until November 2024.  Baseline hemoglobin between 11.7 and 12.1 in 2023.   Most recent labs 03/15/2023 showed improved hemoglobin 12.3, MCV 86, total iron 51, low iron saturation 14%, low ferritin 20, vitamin B12 of 272 (low normal).  He was continued on iron and B12 supplements.  Patient was scheduled for EGD and colonoscopy February 2025, however he canceled procedures.  He received cardiac clearance from Dr. Mariah Milling to do procedures.  Dr. Mariah Milling believed he was only taking 81 mg aspirin and not Plavix.    Patient's daughter called in February and requested for her father to only do the EGD procedure.  She did not believe her father could tolerate a colonoscopy prep and procedure.  She stated they would call back when they were ready to schedule EGD and colonoscopy procedures.  Medical history significant for CHF, CKD, CAD with stents and CABG (2022), diabetes, hypertension, history of CVA (11/2021), COPD, gout, tobacco dependence.  Currently taking ferrous sulfate 325 mg iron daily.  Also takes Plavix daily.  Not on a PPI.  Echo 12/2022 showed LVEF 50 to 55%.  Last cardiology follow-up 02/15/2023.  Cardiologist Dr. Mariah Milling.  Also followed by nephrology.   RUQ ultrasound 11/2022 showed distended gallbladder with gallstones and sludge.  Mildly enlarged liver with mild nodular contour.  No hepatic  lesions.  CBD 0.29 cm.  Concerns about chronic liver disease.  Current Outpatient Medications  Medication Sig Dispense Refill   allopurinol (ZYLOPRIM) 100 MG tablet Take 1 tablet (100 mg total) by mouth daily. 90 tablet 3   atorvastatin (LIPITOR) 40 MG tablet Take 1 tablet (40 mg total) by mouth daily. 90 tablet 2   Blood Pressure Monitoring (ADULT BLOOD PRESSURE CUFF LG) KIT 1 each by Does not apply route as needed (to check blood pressure). 1 kit 0   carvedilol (COREG) 3.125 MG tablet Take 3.125 mg by mouth 2 (two) times daily with a meal.     clopidogrel (PLAVIX) 75 MG tablet Take 1 tablet (75 mg total) by mouth daily. 90 tablet 1   dapagliflozin propanediol (FARXIGA) 10 MG TABS tablet Take 1 tablet (10 mg total) by mouth daily. 90 tablet 3   ezetimibe (ZETIA) 10 MG tablet Take 1 tablet (10 mg total) by mouth daily. 90 tablet 3   furosemide (LASIX) 20 MG tablet Take 1 tablet (20 mg total) by mouth daily as needed (As needed for weight gain of 2 pounds overnight). 90 tablet 3   hydrALAZINE (APRESOLINE) 50 MG tablet TAKE 1 TABLET BY MOUTH THREE TIMES A DAY (Patient taking differently: Take 50 mg by mouth in the morning and at bedtime.) 270 tablet 2   Iron, Ferrous Sulfate, 325 (65 Fe) MG TABS Take 325 mg by mouth daily. 90 tablet 1   isosorbide mononitrate (IMDUR) 30 MG 24 hr tablet Take 1 tablet (30 mg total) by mouth  daily. 90 tablet 3   losartan (COZAAR) 25 MG tablet Take 1 tablet (25 mg total) by mouth daily. 90 tablet 3   No current facility-administered medications for this visit.    Allergies as of 05/02/2023 - Review Complete 03/28/2023  Allergen Reaction Noted   Entresto [sacubitril-valsartan]  01/20/2022   Metoprolol Other (See Comments) 12/06/2022    Past Medical History:  Diagnosis Date   Arthritis    CHF (congestive heart failure) (HCC)    Chronic HFrEF (heart failure with reduced ejection fraction) (HCC)    a. 10/2020 Echo: EF 25-30%, nl RV fxn, mild MR.   CKD (chronic  kidney disease), stage III (HCC)    Coronary artery disease    a. 2002 s/p CABG x 2: LIMA->LAD, VG->RPDA; b. 11/2020 Cath: LM 20ost, LAD 131m, LCX 99p/m, 24m/d, OM1 30, RCA 100p - fills via collats from LAD. RPL1 fills via collats from LCX. VG->PDA 100, LIMA->LAD ok-->med rx.   Gout    Hyperlipidemia LDL goal <70    Hypertension    Ischemic cardiomyopathy    a. 10/2020 Echo: EF 25-30%.   Osteoarthritis     Past Surgical History:  Procedure Laterality Date   CARDIAC CATHETERIZATION     CARDIAC SURGERY     CABG 2002   IR ANGIO EXTRACRAN SEL COM CAROTID INNOMINATE UNI BILAT MOD SED  01/03/2022   IR ANGIO INTRA EXTRACRAN SEL INTERNAL CAROTID UNI R MOD SED  01/29/2022   IR ANGIO VERTEBRAL SEL VERTEBRAL UNI L MOD SED  01/03/2022   IR CT HEAD LTD  01/29/2022   IR INTRA CRAN STENT  01/25/2022   IR RADIOLOGIST EVAL & MGMT  12/21/2021   IR US GUIDE VASC ACCESS RIGHT  01/03/2022   IR US GUIDE VASC ACCESS RIGHT  01/25/2022   LAPAROSCOPIC APPENDECTOMY N/A 08/11/2018   Procedure: APPENDECTOMY LAPAROSCOPIC;  Surgeon: Henrene Dodge, MD;  Location: [facility] ORS;  Service: General;  Laterality: N/A;   RADIOLOGY WITH ANESTHESIA N/A 01/25/2022   Procedure: Cerebral angioplasty with possible stenting;  Surgeon: Julieanne Cotton, MD;  Location: [facility] OR;  Service: Radiology;  Laterality: N/A;   RIGHT/LEFT HEART CATH AND CORONARY/GRAFT ANGIOGRAPHY N/A 11/15/2020   Procedure: RIGHT/LEFT HEART CATH AND CORONARY/GRAFT ANGIOGRAPHY;  Surgeon: Iran Ouch, MD;  Location: [facility] INVASIVE CV LAB;  Service: Cardiovascular;  Laterality: N/A;    Review of Systems:    All systems reviewed and negative except where noted in HPI.   Physical Examination:   There were no vitals taken for this visit.  General: Well-nourished, well-developed in no acute distress.  Lungs: Clear to auscultation bilaterally. Non-labored. Heart: Regular rate and rhythm, no murmurs rubs or gallops.  Abdomen: Bowel sounds are normal;  Abdomen is Soft; No hepatosplenomegaly, masses or hernias;  No Abdominal Tenderness; No guarding or rebound tenderness. Neuro: Alert and oriented x 3.  Grossly intact.  Psych: Alert and cooperative, normal mood and affect.   Imaging Studies: No results found.  Assessment and Plan:   Benjamin Valentine is a 78 y.o. y/o male ***    Celso Amy, [name]  Follow up ***  BP check ***

## 2023-05-02 ENCOUNTER — Ambulatory Visit: Payer: 59 | Admitting: Physician Assistant

## 2023-05-13 ENCOUNTER — Other Ambulatory Visit: Payer: Self-pay | Admitting: Nurse Practitioner

## 2023-05-15 NOTE — Telephone Encounter (Signed)
 Requested medication (s) are due for refill today: historical and expired medications  Requested medication (s) are on the active medication list: yes   Last refill:  coreg- historical , allopurinol- 06/29/21 #90 3 refills, hydralazine-01/31/22 #270  2 refills  Future visit scheduled: yes in 2 weeks   Notes to clinic:  historical medication, allopurinol, hydralazine last ordered by Corky Downs, MD expired. Do you want to order Rxs?     Requested Prescriptions  Pending Prescriptions Disp Refills   carvedilol (COREG) 3.125 MG tablet [Pharmacy Med Name: CARVEDILOL 3.125 MG TABLET] 180 tablet 2    Sig: TAKE 1 TABLET BY MOUTH TWICE A DAY WITH FOOD     Cardiovascular: Beta Blockers 3 Failed - 05/15/2023  3:57 PM      Failed - Cr in normal range and within 360 days    Creat  Date Value Ref Range Status  09/22/2022 1.79 (H) 0.70 - 1.28 mg/dL Final   Creatinine, Ser  Date Value Ref Range Status  02/15/2023 1.47 (H) 0.76 - 1.27 mg/dL Final         Failed - Valid encounter within last 6 months    Recent Outpatient Visits   None     Future Appointments             In 2 weeks Zane Herald, Rudolpho Sevin, [name] [facility]            Passed - AST in normal range and within 360 days    AST  Date Value Ref Range Status  09/22/2022 13 10 - 35 U/L Final   SGOT(AST)  Date Value Ref Range Status  09/20/2013 29 15 - 37 Unit/L Final         Passed - ALT in normal range and within 360 days    ALT  Date Value Ref Range Status  09/22/2022 13 9 - 46 U/L Final   SGPT (ALT)  Date Value Ref Range Status  09/20/2013 40 U/L Final    Comment:    14-63 NOTE: New Reference Range 09/02/13          Passed - Last BP in normal range    BP Readings from Last 1 Encounters:  03/15/23 122/70         Passed - Last Heart Rate in normal range    Pulse Readings from Last 1 Encounters:  03/15/23 69          allopurinol (ZYLOPRIM) 100 MG tablet [Pharmacy Med Name:  ALLOPURINOL 100 MG TABLET] 90 tablet 3    Sig: TAKE 1 TABLET BY MOUTH EVERY DAY     Endocrinology:  Gout Agents - allopurinol Failed - 05/15/2023  3:57 PM      Failed - Uric Acid in normal range and within 360 days    Uric Acid, Serum  Date Value Ref Range Status  08/13/2021 7.6 3.7 - 8.6 mg/dL Final    Comment:    Performed at [facility], [address] [place], [place] [address]         Failed - Cr in normal range and within 360 days    Creat  Date Value Ref Range Status  09/22/2022 1.79 (H) 0.70 - 1.28 mg/dL Final   Creatinine, Ser  Date Value Ref Range Status  02/15/2023 1.47 (H) 0.76 - 1.27 mg/dL Final         Failed - Valid encounter within last 12 months    Recent Outpatient Visits  None     Future Appointments             In 2 weeks Zane Herald, Rudolpho Sevin, [name] [facility], [facility]            Passed - CBC within normal limits and completed in the last 12 months    WBC  Date Value Ref Range Status  03/15/2023 7.7 3.4 - 10.8 x10E3/uL Final  09/22/2022 7.6 3.8 - 10.8 Thousand/uL Final   RBC  Date Value Ref Range Status  03/15/2023 4.70 4.14 - 5.80 x10E6/uL Final  09/22/2022 4.73 4.20 - 5.80 Million/uL Final   Hemoglobin  Date Value Ref Range Status  03/15/2023 12.3 (L) 13.0 - 17.7 g/dL Final   Hematocrit  Date Value Ref Range Status  03/15/2023 40.5 37.5 - 51.0 % Final   MCHC  Date Value Ref Range Status  03/15/2023 30.4 (L) 31.5 - 35.7 g/dL Final  16/11/9602 54.0 (L) 32.0 - 36.0 g/dL Final   [facility]  Date Value Ref Range Status  03/15/2023 26.2 (L) 26.6 - 33.0 pg Final  09/22/2022 20.9 (L) 27.0 - 33.0 pg Final   MCV  Date Value Ref Range Status  03/15/2023 86 79 - 97 fL Final  09/20/2013 88 80 - 100 fL Final   No results found for: "PLTCOUNTKUC", "LABPLAT", "POCPLA" RDW  Date Value Ref Range Status  03/15/2023 16.6 (H) 11.6 - 15.4 % Final  09/20/2013 14.6 (H) 11.5 - 14.5 % Final          hydrALAZINE  (APRESOLINE) 50 MG tablet [Pharmacy Med Name: HYDRALAZINE 50 MG TABLET] 270 tablet 2    Sig: TAKE 1 TABLET BY MOUTH THREE TIMES A DAY     Cardiovascular:  Vasodilators Failed - 05/15/2023  3:57 PM      Failed - HGB in normal range and within 360 days    Hemoglobin  Date Value Ref Range Status  03/15/2023 12.3 (L) 13.0 - 17.7 g/dL Final         Failed - ANA Screen, Ifa, Serum in normal range and within 360 days    Anti Nuclear Antibody (ANA)  Date Value Ref Range Status  06/16/2020 NEGATIVE NEGATIVE Final    Comment:    ANA IFA is a first line screen for detecting the presence of up to approximately 150 autoantibodies in various autoimmune diseases. A negative ANA IFA result suggests an ANA-associated autoimmune disease is not present at this time, but is not definitive. If there is high clinical suspicion for Sjogren's syndrome, testing for anti-SS-A/Ro antibody should be considered. Anti-Jo-1 antibody should be considered for clinically suspected inflammatory myopathies. . AC-0: Negative . International Consensus on ANA Patterns (SeverTies.uy) . For additional information, please refer to http://education.[employer].com/faq/FAQ177 (This link is being provided for informational/ educational purposes only.) .          Failed - Valid encounter within last 12 months    Recent Outpatient Visits   None     Future Appointments             In 2 weeks Zane Herald Rudolpho Sevin, [name] [facility]            Passed - HCT in normal range and within 360 days    Hematocrit  Date Value Ref Range Status  03/15/2023 40.5 37.5 - 51.0 % Final         Passed - RBC in normal range and within 360 days    RBC  Date Value Ref Range Status  03/15/2023 4.70 4.14 - 5.80 x10E6/uL Final  09/22/2022 4.73 4.20 - 5.80 Million/uL Final         Passed - WBC in normal range and within 360 days    WBC  Date Value Ref Range Status   03/15/2023 7.7 3.4 - 10.8 x10E3/uL Final  09/22/2022 7.6 3.8 - 10.8 Thousand/uL Final         Passed - PLT in normal range and within 360 days    Platelets  Date Value Ref Range Status  03/15/2023 358 150 - 450 x10E3/uL Final         Passed - Last BP in normal range    BP Readings from Last 1 Encounters:  03/15/23 122/70

## 2023-05-28 NOTE — Progress Notes (Signed)
 [facility] Quality Team Note  Name: Benjamin Valentine Date of Birth: [birth year] MRN: [id number] Date: 05/28/2023  [facility] Quality Team has reviewed this patient's chart, please see recommendations below:  [facility] Quality Other; (Patient due for KED labs. Patient needs URINE Microalbumin/Creatinine Ratio completed in 2025 for gap closure. Please address at upcoming visit with PCP on 06/04/2023)

## 2023-06-04 ENCOUNTER — Ambulatory Visit: Payer: Self-pay | Admitting: Nurse Practitioner

## 2023-06-19 ENCOUNTER — Other Ambulatory Visit: Payer: Self-pay | Admitting: Nurse Practitioner

## 2023-06-19 DIAGNOSIS — D509 Iron deficiency anemia, unspecified: Secondary | ICD-10-CM

## 2023-06-20 NOTE — Telephone Encounter (Signed)
 Requested Prescriptions  Pending Prescriptions Disp Refills   ferrous sulfate  325 (65 FE) MG tablet [Pharmacy Med Name: FERROUS SULFATE  325 MG TABLET] 30 tablet 0    Sig: TAKE 1 TABLET BY MOUTH EVERY DAY     Endocrinology:  Minerals - Iron  Supplementation Failed - 06/20/2023  2:56 PM      Failed - HGB in normal range and within 360 days    Hemoglobin  Date Value Ref Range Status  03/15/2023 12.3 (L) 13.0 - 17.7 g/dL Final         Failed - Ferritin in normal range and within 360 days    Ferritin  Date Value Ref Range Status  03/15/2023 20 (L) 30 - 400 ng/mL Final         Failed - Valid encounter within last 12 months    Recent Outpatient Visits   None            Passed - HCT in normal range and within 360 days    Hematocrit  Date Value Ref Range Status  03/15/2023 40.5 37.5 - 51.0 % Final         Passed - RBC in normal range and within 360 days    RBC  Date Value Ref Range Status  03/15/2023 4.70 4.14 - 5.80 x10E6/uL Final  09/22/2022 4.73 4.20 - 5.80 Million/uL Final         Passed - Fe (serum) in normal range and within 360 days    Iron   Date Value Ref Range Status  03/15/2023 51 38 - 169 ug/dL Final   %SAT  Date Value Ref Range Status  09/22/2022 6 (L) 20 - 48 % (calc) Final   Iron  Saturation  Date Value Ref Range Status  03/15/2023 14 (L) 15 - 55 % Final          Courtesy refill.

## 2023-07-13 ENCOUNTER — Other Ambulatory Visit: Payer: Self-pay | Admitting: Nurse Practitioner

## 2023-07-13 DIAGNOSIS — D509 Iron deficiency anemia, unspecified: Secondary | ICD-10-CM

## 2023-07-16 NOTE — Telephone Encounter (Signed)
 Requested medications are due for refill today.  yes  Requested medications are on the active medications list.  yes  Last refill. 06/20/2023 #30 0 rf  Future visit scheduled.   no  Notes to clinic.  Pt missed follow up appt in April. Pt already given a courtesy refill. Please review for refill.    Requested Prescriptions  Pending Prescriptions Disp Refills   ferrous sulfate  325 (65 FE) MG tablet [Pharmacy Med Name: FERROUS SULFATE  325 MG TABLET] 90 tablet 1    Sig: TAKE 1 TABLET BY MOUTH EVERY DAY     Endocrinology:  Minerals - Iron  Supplementation Failed - 07/16/2023  8:06 AM      Failed - HGB in normal range and within 360 days    Hemoglobin  Date Value Ref Range Status  03/15/2023 12.3 (L) 13.0 - 17.7 g/dL Final         Failed - Ferritin in normal range and within 360 days    Ferritin  Date Value Ref Range Status  03/15/2023 20 (L) 30 - 400 ng/mL Final         Failed - Valid encounter within last 12 months    Recent Outpatient Visits   None            Passed - HCT in normal range and within 360 days    Hematocrit  Date Value Ref Range Status  03/15/2023 40.5 37.5 - 51.0 % Final         Passed - RBC in normal range and within 360 days    RBC  Date Value Ref Range Status  03/15/2023 4.70 4.14 - 5.80 x10E6/uL Final  09/22/2022 4.73 4.20 - 5.80 Million/uL Final         Passed - Fe (serum) in normal range and within 360 days    Iron   Date Value Ref Range Status  03/15/2023 51 38 - 169 ug/dL Final   %SAT  Date Value Ref Range Status  09/22/2022 6 (L) 20 - 48 % (calc) Final   Iron  Saturation  Date Value Ref Range Status  03/15/2023 14 (L) 15 - 55 % Final

## 2023-07-18 ENCOUNTER — Ambulatory Visit: Admitting: Nurse Practitioner

## 2023-07-18 ENCOUNTER — Encounter: Payer: Self-pay | Admitting: Nurse Practitioner

## 2023-07-18 VITALS — BP 136/72 | HR 83 | Temp 97.7°F | Ht 69.0 in | Wt 214.2 lb

## 2023-07-18 DIAGNOSIS — J449 Chronic obstructive pulmonary disease, unspecified: Secondary | ICD-10-CM

## 2023-07-18 DIAGNOSIS — I10 Essential (primary) hypertension: Secondary | ICD-10-CM

## 2023-07-18 DIAGNOSIS — E1122 Type 2 diabetes mellitus with diabetic chronic kidney disease: Secondary | ICD-10-CM | POA: Diagnosis not present

## 2023-07-18 DIAGNOSIS — M109 Gout, unspecified: Secondary | ICD-10-CM

## 2023-07-18 DIAGNOSIS — M19071 Primary osteoarthritis, right ankle and foot: Secondary | ICD-10-CM

## 2023-07-18 DIAGNOSIS — I6521 Occlusion and stenosis of right carotid artery: Secondary | ICD-10-CM

## 2023-07-18 DIAGNOSIS — I5020 Unspecified systolic (congestive) heart failure: Secondary | ICD-10-CM

## 2023-07-18 DIAGNOSIS — D509 Iron deficiency anemia, unspecified: Secondary | ICD-10-CM | POA: Diagnosis not present

## 2023-07-18 DIAGNOSIS — E538 Deficiency of other specified B group vitamins: Secondary | ICD-10-CM

## 2023-07-18 DIAGNOSIS — M25551 Pain in right hip: Secondary | ICD-10-CM

## 2023-07-18 DIAGNOSIS — I2510 Atherosclerotic heart disease of native coronary artery without angina pectoris: Secondary | ICD-10-CM

## 2023-07-18 DIAGNOSIS — Z8673 Personal history of transient ischemic attack (TIA), and cerebral infarction without residual deficits: Secondary | ICD-10-CM | POA: Diagnosis not present

## 2023-07-18 DIAGNOSIS — N1832 Chronic kidney disease, stage 3b: Secondary | ICD-10-CM

## 2023-07-18 DIAGNOSIS — N1831 Chronic kidney disease, stage 3a: Secondary | ICD-10-CM

## 2023-07-18 DIAGNOSIS — R350 Frequency of micturition: Secondary | ICD-10-CM | POA: Diagnosis not present

## 2023-07-18 DIAGNOSIS — E785 Hyperlipidemia, unspecified: Secondary | ICD-10-CM

## 2023-07-18 LAB — POCT URINALYSIS DIPSTICK
Bilirubin, UA: NEGATIVE
Blood, UA: NEGATIVE
Glucose, UA: POSITIVE — AB
Ketones, UA: NEGATIVE
Leukocytes, UA: NEGATIVE
Nitrite, UA: NEGATIVE
Odor: NORMAL
Protein, UA: NEGATIVE
Spec Grav, UA: 1.015 (ref 1.010–1.025)
Urobilinogen, UA: 0.2 U/dL
pH, UA: 5 (ref 5.0–8.0)

## 2023-07-18 MED ORDER — TIZANIDINE HCL 4 MG PO TABS: 2.0000 mg | ORAL_TABLET | Freq: Three times a day (TID) | ORAL | 0 refills | Status: AC | PRN

## 2023-07-18 MED ORDER — CLOPIDOGREL BISULFATE 75 MG PO TABS: 75.0000 mg | ORAL_TABLET | Freq: Every day | ORAL | 1 refills | Status: DC

## 2023-07-18 NOTE — Progress Notes (Signed)
 BP 136/72   Pulse 83   Temp 97.7 F (36.5 C)   Ht 5\' 9"  (1.753 m)   Wt 214 lb 3.2 oz (97.2 kg)   SpO2 97%   BMI 31.63 kg/m    Subjective:    Patient ID: Benjamin Valentine, male    DOB: 1945/05/26, 78 y.o.   MRN: [id number]  HPI: Benjamin Valentine is a 78 y.o. male  Chief Complaint  Patient presents with   Medical Management of Chronic Issues   Hip Pain    Right onset 1 week no trauma    Discussed the use of AI scribe software for clinical note transcription with the patient, who gave verbal consent to proceed.  History of Present Illness Benjamin Valentine is a 78 year old male with osteoarthritis who presents with right hip pain and routine follow up.   He has been experiencing right hip pain for about a week, located in the 'bend part' and felt when standing up. There is no history of trauma to the area. He has been taking Tylenol  for pain management.  His medical history includes heart failure, coronary artery disease, hypertension, COPD, type 2 diabetes, chronic kidney disease, osteoarthritis, hyperlipidemia, history of TIA, gout, iron  deficiency anemia, B12 deficiency, and obesity. He is established with nephrology and cardiology, with his last visits on April 30, 2023, and February 15, 2023, respectively. He is overdue for lab work.  His current medications include allopurinol  100 mg daily, atorvastatin  40 mg daily, carvedilol  3.125 mg twice daily, Plavix  75 mg daily, Farxiga  10 mg daily, Zetia  10 mg daily, Lasix  20 mg as needed, hydralazine  50 mg twice daily, Imdur  30 mg daily, and losartan  25 mg daily.  He experiences frequent urination every thirty minutes, which he attributes to his medication regimen. He has not been checking his blood sugar recently but feels fine. No blood in stool and recalls a previous GI evaluation that was normal. He has not had a colonoscopy and is apprehensive about the procedure. He also does not check his feet regularly, which is important given his  diabetes.         07/18/2023    2:05 PM 01/29/2023   10:29 AM 11/30/2022    1:19 PM  Depression screen PHQ 2/9  Decreased Interest 0 0 0  Down, Depressed, Hopeless 0 0 0  PHQ - 2 Score 0 0 0  Altered sleeping 0 0   Tired, decreased energy 0 0   Change in appetite 0 0   Feeling bad or failure about yourself  0 0   Trouble concentrating 0 0   Moving slowly or fidgety/restless 0 0   Suicidal thoughts 0 0   PHQ-9 Score 0 0   Difficult doing work/chores Not difficult at all      Relevant past medical, surgical, family and social history reviewed and updated as indicated. Interim medical history since our last visit reviewed. Allergies and medications reviewed and updated.  Review of Systems  Constitutional: Negative for fever or weight change.  Respiratory: Negative for cough and shortness of breath.   Cardiovascular: Negative for chest pain or palpitations.  Gastrointestinal: Negative for abdominal pain, no bowel changes.  Musculoskeletal: Negative for gait problem or joint swelling. Positive for right hip pain Skin: Negative for rash.  Neurological: Negative for dizziness or headache.  No other specific complaints in a complete review of systems (except as listed in HPI above).      Objective:  BP 136/72   Pulse 83   Temp 97.7 F (36.5 C)   Ht 5\' 9"  (1.753 m)   Wt 214 lb 3.2 oz (97.2 kg)   SpO2 97%   BMI 31.63 kg/m    Wt Readings from Last 3 Encounters:  07/18/23 214 lb 3.2 oz (97.2 kg)  03/15/23 219 lb 12.8 oz (99.7 kg)  02/15/23 220 lb (99.8 kg)    Physical Exam Vitals reviewed.  Constitutional:      Appearance: Normal appearance.  HENT:     Head: Normocephalic.  Cardiovascular:     Rate and Rhythm: Normal rate and regular rhythm.  Pulmonary:     Effort: Pulmonary effort is normal.     Breath sounds: Normal breath sounds.  Musculoskeletal:        General: Normal range of motion.     Lumbar back: Tenderness present.  Skin:    General: Skin is  warm and dry.  Neurological:     General: No focal deficit present.     Mental Status: He is alert and oriented to person, place, and time. Mental status is at baseline.  Psychiatric:        Mood and Affect: Mood normal.        Behavior: Behavior normal.        Thought Content: Thought content normal.        Judgment: Judgment normal.     Diabetic Foot Exam - Simple   Simple Foot Form Diabetic Foot exam was performed with the following findings: Yes 07/18/2023  2:45 PM  Visual Inspection No deformities, no ulcerations, no other skin breakdown bilaterally: Yes Sensation Testing Intact to touch and monofilament testing bilaterally: Yes Pulse Check Posterior Tibialis and Dorsalis pulse intact bilaterally: Yes Comments       Results for orders placed or performed in visit on 07/18/23  POCT urinalysis dipstick   Collection Time: 07/18/23  2:39 PM  Result Value Ref Range   Color, UA yellow    Clarity, UA clear    Glucose, UA Positive (A) Negative   Bilirubin, UA negative    Ketones, UA negative    Spec Grav, UA 1.015 1.010 - 1.025   Blood, UA negative    pH, UA 5.0 5.0 - 8.0   Protein, UA Negative Negative   Urobilinogen, UA 0.2 0.2 or 1.0 E.U./dL   Nitrite, UA negative    Leukocytes, UA Negative Negative   Appearance clear    Odor normal           Assessment & Plan:   Problem List Items Addressed This Visit       Cardiovascular and Mediastinum   Essential hypertension   Relevant Orders   CBC with Differential/Platelet   Comprehensive metabolic panel with GFR   Coronary artery disease involving native coronary artery of native heart without angina pectoris - Primary   Relevant Orders   Lipid panel   Stenosis of intracranial portions of right internal carotid artery   Relevant Medications   clopidogrel  (PLAVIX ) 75 MG tablet   HFrEF (heart failure with reduced ejection fraction) (HCC)     Respiratory   COPD (chronic obstructive pulmonary disease) (HCC)      Endocrine   Type 2 diabetes mellitus with diabetic chronic kidney disease (HCC) (Chronic)   Relevant Orders   Comprehensive metabolic panel with GFR   Microalbumin / creatinine urine ratio   Hemoglobin A1c   HM Diabetes Foot Exam (Completed)   Ambulatory referral to Ophthalmology  Musculoskeletal and Integument   Primary osteoarthritis of both ankles   Relevant Medications   tiZANidine (ZANAFLEX) 4 MG tablet     Genitourinary   Chronic kidney disease, stage 3b (HCC)   Relevant Orders   Comprehensive metabolic panel with GFR     Other   Gout   Morbid (severe) obesity due to excess calories (HCC)   Hyperlipidemia   Relevant Orders   Comprehensive metabolic panel with GFR   Lipid panel   History of TIA (transient ischemic attack)   Relevant Medications   clopidogrel  (PLAVIX ) 75 MG tablet   Other Relevant Orders   Lipid panel   Other Visit Diagnoses       Iron  deficiency anemia, unspecified iron  deficiency anemia type       Relevant Orders   Iron , TIBC and Ferritin Panel     B12 deficiency       Relevant Orders   Vitamin B12     Right hip pain       Relevant Medications   tiZANidine (ZANAFLEX) 4 MG tablet     Urinary frequency       Relevant Orders   PSA   POCT urinalysis dipstick (Completed)        Assessment and Plan Assessment & Plan Right Hip Pain Right hip pain for about a week without trauma. Pain is localized around the hip, not in the joint. Differential includes musculoskeletal strain or arthritis. Advised against ibuprofen  due to renal concerns, recommending Tylenol  and Voltaren gel. - Use Tylenol  for pain management, avoiding ibuprofen  - Apply Voltaren gel to the affected area - Prescribe a muscle relaxant - Use a heating pad for no longer than 20 minutes at a time  Copd -stable -no changes  Chronic Kidney Disease Chronic kidney disease with type 2 diabetes and hypertension. Established with nephrology. Advised against ibuprofen  due to renal  concerns. - Avoid ibuprofen  - Check urine for infection and renal function  Type 2 Diabetes Mellitus Type 2 diabetes with last A1c of 5.7. No recent blood glucose monitoring reported. Emphasized importance of monitoring blood glucose levels. - Check blood glucose levels -continue   Farxiga  10 mg daily.  Hypertension Hypertension managed with multiple medications. Recent blood pressure reading was 132/72 mmHg, within target range.  Coronary Artery Disease and Heart Failure/hx of tia Coronary artery disease and heart failure. Established with cardiology, last seen by heart failure clinic on February 15, 2023. No current cardiac symptoms reported. atorvastatin  40 mg daily, carvedilol  3.125 mg twice daily, Plavix  75 mg daily, Farxiga  10 mg daily, Zetia  10 mg daily, Lasix  20 mg as needed, hydralazine  50 mg twice daily, Imdur  30 mg daily, and losartan  25 mg daily.  Prostate Health Increased urinary frequency possibly related to Lasix  or prostate issues. No weak stream reported. Plan to check PSA levels to rule out prostate issues. - Check PSA levels with blood test  Obesity -comorbidities include HTN, DM, HLD -- Encourage continuation of lifestyle modifications, including dietary management and regular exercise. -continue to increase physical activity, getting at least 150 min of physical activity a week.  Work on including [employer], broadcasting/film/video 2 days a week.  - continue eating at a calorie deficit 1800-2000 cal a day, eating a well balanced diet with whole foods, avoiding processed foods.     General Health Maintenance Overdue for labs and colonoscopy. Discussed importance of colonoscopy and reassured him about the procedure, emphasizing sedation. - Order comprehensive blood work - Encourage scheduling a colonoscopy  Gout -no  recent flares -continue allopurinol  100 mg daily  Follow-up Follow-up plans for lab work and urine tests. Provided written instructions for medications and treatments  to ensure understanding and compliance. - Perform urine test for infection and renal function - Conduct blood work - Provide written instructions for medications and treatments        Follow up plan: Return in about 4 months (around 11/17/2023) for follow up.

## 2023-07-18 NOTE — Patient Instructions (Signed)
 Voltaren gel

## 2023-07-19 ENCOUNTER — Ambulatory Visit: Payer: Self-pay | Admitting: Nurse Practitioner

## 2023-07-20 LAB — IRON,TIBC AND FERRITIN PANEL
%SAT: 43 % (ref 20–48)
Ferritin: 16 ng/mL — ABNORMAL LOW (ref 24–380)
Iron: 164 ug/dL (ref 50–180)
TIBC: 383 ug/dL (ref 250–425)

## 2023-07-20 LAB — MICROALBUMIN / CREATININE URINE RATIO
Creatinine, Urine: 170 mg/dL (ref 20–320)
Microalb Creat Ratio: 16 mg/g{creat} (ref <30)
Microalb, Ur: 2.7 mg/dL

## 2023-07-20 LAB — CBC WITH DIFFERENTIAL/PLATELET
Absolute Lymphocytes: 2046 {cells}/uL (ref 850–3900)
Absolute Monocytes: 794 {cells}/uL (ref 200–950)
Basophils Absolute: 19 {cells}/uL (ref 0–200)
Basophils Relative: 0.3 %
Eosinophils Absolute: 167 {cells}/uL (ref 15–500)
Eosinophils Relative: 2.7 %
HCT: 44.9 % (ref 38.5–50.0)
Hemoglobin: 14 g/dL (ref 13.2–17.1)
MCH: 27.3 pg (ref 27.0–33.0)
MCHC: 31.2 g/dL — ABNORMAL LOW (ref 32.0–36.0)
MCV: 87.7 fL (ref 80.0–100.0)
MPV: 11.3 fL (ref 7.5–12.5)
Monocytes Relative: 12.8 %
Neutro Abs: 3174 {cells}/uL (ref 1500–7800)
Neutrophils Relative %: 51.2 %
Platelets: 307 10*3/uL (ref 140–400)
RBC: 5.12 10*6/uL (ref 4.20–5.80)
RDW: 15 % (ref 11.0–15.0)
Total Lymphocyte: 33 %
WBC: 6.2 10*3/uL (ref 3.8–10.8)

## 2023-07-20 LAB — PSA: PSA: 2.02 ng/mL (ref <=4.00)

## 2023-07-20 LAB — LIPID PANEL
Cholesterol: 104 mg/dL (ref <200)
HDL: 22 mg/dL — ABNORMAL LOW (ref >=40)
LDL Cholesterol (Calc): 54 mg/dL
Non-HDL Cholesterol (Calc): 82 mg/dL (ref <130)
Total CHOL/HDL Ratio: 4.7 (calc) (ref <5.0)
Triglycerides: 221 mg/dL — ABNORMAL HIGH (ref <150)

## 2023-07-20 LAB — COMPREHENSIVE METABOLIC PANEL WITH GFR
AG Ratio: 1.3 (calc) (ref 1.0–2.5)
ALT: 12 U/L (ref 9–46)
AST: 15 U/L (ref 10–35)
Albumin: 4.2 g/dL (ref 3.6–5.1)
Alkaline phosphatase (APISO): 57 U/L (ref 35–144)
BUN/Creatinine Ratio: 13 (calc) (ref 6–22)
BUN: 23 mg/dL (ref 7–25)
CO2: 26 mmol/L (ref 20–32)
Calcium: 9.4 mg/dL (ref 8.6–10.3)
Chloride: 105 mmol/L (ref 98–110)
Creat: 1.73 mg/dL — ABNORMAL HIGH (ref 0.70–1.28)
Globulin: 3.2 g/dL (ref 1.9–3.7)
Glucose, Bld: 103 mg/dL — ABNORMAL HIGH (ref 65–99)
Potassium: 4.6 mmol/L (ref 3.5–5.3)
Sodium: 139 mmol/L (ref 135–146)
Total Bilirubin: 0.3 mg/dL (ref 0.2–1.2)
Total Protein: 7.4 g/dL (ref 6.1–8.1)
eGFR: 40 mL/min/{1.73_m2} — ABNORMAL LOW (ref >=60)

## 2023-07-20 LAB — VITAMIN B12: Vitamin B-12: 213 pg/mL (ref 200–1100)

## 2023-07-20 LAB — HEMOGLOBIN A1C
Hgb A1c MFr Bld: 7.8 % — ABNORMAL HIGH (ref <5.7)
Mean Plasma Glucose: 177 mg/dL
eAG (mmol/L): 9.8 mmol/L

## 2023-07-31 DIAGNOSIS — I129 Hypertensive chronic kidney disease with stage 1 through stage 4 chronic kidney disease, or unspecified chronic kidney disease: Secondary | ICD-10-CM | POA: Diagnosis not present

## 2023-07-31 DIAGNOSIS — E875 Hyperkalemia: Secondary | ICD-10-CM | POA: Diagnosis not present

## 2023-07-31 DIAGNOSIS — E1122 Type 2 diabetes mellitus with diabetic chronic kidney disease: Secondary | ICD-10-CM | POA: Diagnosis not present

## 2023-07-31 DIAGNOSIS — D631 Anemia in chronic kidney disease: Secondary | ICD-10-CM | POA: Diagnosis not present

## 2023-07-31 DIAGNOSIS — N1832 Chronic kidney disease, stage 3b: Secondary | ICD-10-CM | POA: Diagnosis not present

## 2023-07-31 DIAGNOSIS — E785 Hyperlipidemia, unspecified: Secondary | ICD-10-CM | POA: Diagnosis not present

## 2023-08-07 ENCOUNTER — Other Ambulatory Visit: Payer: Self-pay | Admitting: Nurse Practitioner

## 2023-08-07 DIAGNOSIS — I5020 Unspecified systolic (congestive) heart failure: Secondary | ICD-10-CM

## 2023-08-07 DIAGNOSIS — Z951 Presence of aortocoronary bypass graft: Secondary | ICD-10-CM

## 2023-08-07 DIAGNOSIS — I2510 Atherosclerotic heart disease of native coronary artery without angina pectoris: Secondary | ICD-10-CM

## 2023-08-08 ENCOUNTER — Telehealth: Payer: Self-pay | Admitting: Family

## 2023-08-08 DIAGNOSIS — H2513 Age-related nuclear cataract, bilateral: Secondary | ICD-10-CM | POA: Diagnosis not present

## 2023-08-08 DIAGNOSIS — H40003 Preglaucoma, unspecified, bilateral: Secondary | ICD-10-CM | POA: Diagnosis not present

## 2023-08-08 DIAGNOSIS — H401131 Primary open-angle glaucoma, bilateral, mild stage: Secondary | ICD-10-CM | POA: Diagnosis not present

## 2023-08-08 DIAGNOSIS — H25013 Cortical age-related cataract, bilateral: Secondary | ICD-10-CM | POA: Diagnosis not present

## 2023-08-08 NOTE — Telephone Encounter (Signed)
 Requested Prescriptions  Pending Prescriptions Disp Refills   isosorbide  mononitrate (IMDUR ) 30 MG 24 hr tablet [Pharmacy Med Name: ISOSORBIDE  MONONIT ER 30 MG TB] 90 tablet 0    Sig: TAKE 1 TABLET BY MOUTH EVERY DAY     Cardiovascular:  Nitrates Passed - 08/08/2023  1:43 PM      Passed - Last BP in normal range    BP Readings from Last 1 Encounters:  07/18/23 136/72         Passed - Last Heart Rate in normal range    Pulse Readings from Last 1 Encounters:  07/18/23 83         Passed - Valid encounter within last 12 months    Recent Outpatient Visits           3 weeks ago Coronary artery disease involving native coronary artery of native heart without angina pectoris   [facility] Benjamin Clarity Valentine, Benjamin Valentine               furosemide  (LASIX ) 20 MG tablet [Pharmacy Med Name: FUROSEMIDE  20 MG TABLET] 90 tablet 0    Sig: TAKE 1 TABLET BY MOUTH DAILY AS NEEDED (AS NEEDED FOR WEIGHT GAIN OF 2 POUNDS OVERNIGHT).     Cardiovascular:  Diuretics - Loop Failed - 08/08/2023  1:43 PM      Failed - Cr in normal range and within 180 days    Creat  Date Value Ref Range Status  07/18/2023 1.73 (H) 0.70 - 1.28 mg/dL Final   Creatinine, Urine  Date Value Ref Range Status  07/18/2023 170 20 - 320 mg/dL Final         Failed - Mg Level in normal range and within 180 days    Magnesium   Date Value Ref Range Status  11/25/2021 2.1 1.7 - 2.4 mg/dL Final    Comment:    Performed at [facility], [address] [place], [place] [address]         Passed - K in normal range and within 180 days    Potassium  Date Value Ref Range Status  07/18/2023 4.6 3.5 - 5.3 mmol/L Final  09/20/2013 3.5 3.5 - 5.1 mmol/L Final         Passed - Ca in normal range and within 180 days    Calcium   Date Value Ref Range Status  07/18/2023 9.4 8.6 - 10.3 mg/dL Final   Calcium , Total  Date Value Ref Range Status  09/20/2013 9.0 8.5 - 10.1 mg/dL Final         Passed - Na  in normal range and within 180 days    Sodium  Date Value Ref Range Status  07/18/2023 139 135 - 146 mmol/L Final  02/15/2023 142 134 - 144 mmol/L Final  09/20/2013 138 136 - 145 mmol/L Final         Passed - Cl in normal range and within 180 days    Chloride  Date Value Ref Range Status  07/18/2023 105 98 - 110 mmol/L Final  09/20/2013 106 98 - 107 mmol/L Final         Passed - Last BP in normal range    BP Readings from Last 1 Encounters:  07/18/23 136/72         Passed - Valid encounter within last 6 months    Recent Outpatient Visits           3 weeks ago Coronary artery disease involving native coronary  artery of native heart without angina pectoris   [facility] [place], Benjamin Valentine, Benjamin Valentine               atorvastatin  (LIPITOR) 40 MG tablet [Pharmacy Med Name: ATORVASTATIN  40 MG TABLET] 90 tablet 0    Sig: TAKE 1 TABLET BY MOUTH EVERY DAY     Cardiovascular:  Antilipid - Statins Failed - 08/08/2023  1:43 PM      Failed - Lipid Panel in normal range within the last 12 months    Cholesterol  Date Value Ref Range Status  07/18/2023 104 <200 mg/dL Final   LDL Cholesterol (Calc)  Date Value Ref Range Status  07/18/2023 54 mg/dL (calc) Final    Comment:    Reference range: <100 . Desirable range <100 mg/dL for primary prevention;   <70 mg/dL for patients with CHD or diabetic patients  with > or = 2 CHD risk factors. [facility] LDL-C is now calculated using the Martin-Hopkins  calculation, which is a validated novel method providing  better accuracy than the Friedewald equation in the  estimation of LDL-C.  Gladis APPLETHWAITE et al. SANDREA. 7986;689(80): 2061-2068  (http://education.[facility].com/faq/FAQ164)    HDL  Date Value Ref Range Status  07/18/2023 22 (L) > OR = 40 mg/dL Final   Triglycerides  Date Value Ref Range Status  07/18/2023 221 (H) <150 mg/dL Final    Comment:    . If a non-fasting specimen was collected, consider repeat  triglyceride testing on a fasting specimen if clinically indicated.  Veatrice et al. J. of Clin. Lipidol. 2015;9:129-169. [facility]RA          Passed - Patient is not pregnant      Passed - Valid encounter within last 12 months    Recent Outpatient Visits           3 weeks ago Coronary artery disease involving native coronary artery of native heart without angina pectoris   [facility] Benjamin Benjamin [place], [place]

## 2023-08-08 NOTE — Telephone Encounter (Signed)
 Called to confirm/remind patient of their appointment at [facility] on 08/09/23.   Appointment:   [x] Confirmed  [] Left mess   [] No answer/No voice mail  [] VM Full/unable to leave message  [] Phone not in service  Patient reminded to bring all medications and/or complete list.  Confirmed patient has transportation. Gave directions, instructed to utilize valet parking.

## 2023-08-09 ENCOUNTER — Encounter: Payer: Self-pay | Admitting: Family

## 2023-08-09 ENCOUNTER — Ambulatory Visit: Payer: 59 | Attending: Family | Admitting: Family

## 2023-08-09 VITALS — BP 122/56 | HR 68 | Wt 218.0 lb

## 2023-08-09 DIAGNOSIS — Z8673 Personal history of transient ischemic attack (TIA), and cerebral infarction without residual deficits: Secondary | ICD-10-CM | POA: Diagnosis not present

## 2023-08-09 DIAGNOSIS — I272 Pulmonary hypertension, unspecified: Secondary | ICD-10-CM | POA: Insufficient documentation

## 2023-08-09 DIAGNOSIS — I5032 Chronic diastolic (congestive) heart failure: Secondary | ICD-10-CM | POA: Diagnosis not present

## 2023-08-09 DIAGNOSIS — I10 Essential (primary) hypertension: Secondary | ICD-10-CM | POA: Diagnosis not present

## 2023-08-09 DIAGNOSIS — D649 Anemia, unspecified: Secondary | ICD-10-CM | POA: Diagnosis not present

## 2023-08-09 DIAGNOSIS — Z951 Presence of aortocoronary bypass graft: Secondary | ICD-10-CM | POA: Insufficient documentation

## 2023-08-09 DIAGNOSIS — Z7902 Long term (current) use of antithrombotics/antiplatelets: Secondary | ICD-10-CM | POA: Diagnosis not present

## 2023-08-09 DIAGNOSIS — I25810 Atherosclerosis of coronary artery bypass graft(s) without angina pectoris: Secondary | ICD-10-CM | POA: Diagnosis not present

## 2023-08-09 DIAGNOSIS — N1832 Chronic kidney disease, stage 3b: Secondary | ICD-10-CM | POA: Diagnosis not present

## 2023-08-09 DIAGNOSIS — N183 Chronic kidney disease, stage 3 unspecified: Secondary | ICD-10-CM | POA: Insufficient documentation

## 2023-08-09 DIAGNOSIS — Z87891 Personal history of nicotine dependence: Secondary | ICD-10-CM | POA: Insufficient documentation

## 2023-08-09 DIAGNOSIS — E785 Hyperlipidemia, unspecified: Secondary | ICD-10-CM | POA: Insufficient documentation

## 2023-08-09 DIAGNOSIS — E1122 Type 2 diabetes mellitus with diabetic chronic kidney disease: Secondary | ICD-10-CM | POA: Diagnosis not present

## 2023-08-09 DIAGNOSIS — I639 Cerebral infarction, unspecified: Secondary | ICD-10-CM

## 2023-08-09 DIAGNOSIS — M109 Gout, unspecified: Secondary | ICD-10-CM | POA: Diagnosis not present

## 2023-08-09 DIAGNOSIS — I428 Other cardiomyopathies: Secondary | ICD-10-CM | POA: Insufficient documentation

## 2023-08-09 DIAGNOSIS — I2510 Atherosclerotic heart disease of native coronary artery without angina pectoris: Secondary | ICD-10-CM | POA: Insufficient documentation

## 2023-08-09 DIAGNOSIS — R5383 Other fatigue: Secondary | ICD-10-CM | POA: Diagnosis present

## 2023-08-09 DIAGNOSIS — I130 Hypertensive heart and chronic kidney disease with heart failure and stage 1 through stage 4 chronic kidney disease, or unspecified chronic kidney disease: Secondary | ICD-10-CM | POA: Insufficient documentation

## 2023-08-09 DIAGNOSIS — Z7984 Long term (current) use of oral hypoglycemic drugs: Secondary | ICD-10-CM | POA: Diagnosis not present

## 2023-08-09 DIAGNOSIS — Z79899 Other long term (current) drug therapy: Secondary | ICD-10-CM | POA: Diagnosis not present

## 2023-08-09 NOTE — Patient Instructions (Signed)
 It was good to see you today!

## 2023-08-09 NOTE — Progress Notes (Signed)
 Advanced Heart Failure Clinic Note    PCP: Gareth Clarity, [name] (last seen 06/25) Primary Cardiologist: Gollan, Timothy, MD (last seen 12/24)  Chief Complaint: fatigue    HPI:  Mr Waibel is a 78 y/o male with a history of CAD (CABG 2022), HTN, melena, anemia, hyperlipidemia, CKD, arthritis, gout, stroke (10/23), carotid stenosis (angiogram 12/23), previous tobacco use and chronic heart failure.   Admitted 11/11/2020 for elevated tropinin. He presented to the hospital with increasing SOB along with ABD distention, and orthopena. Initial HStrop 59, EKG showed T wave inversion in the inferolateral leads. Echo showed decreased LV function with EF 25-30%, mid-mod LV dilation, mild MR. Underwent R/L HC which showed significant 3-vessel disease with patent LIMA to LAD with occluded SVG to RCA and SVG to OM. RHC showed high normal filling pressure, moderate pulmonary hypertension, and mod to severe reduced CO. He was diuresed with lasix  but had increase creatinine above baseline. Admitted back to the hospital on 11/25/20 after presenting with new onset left arm and left knee numbness and weakness. Stroke workup was performed and was negative. He was seen by neurology and was thought symptoms were due to TIA.   Was in the ED 06/01/21 due to dizziness, headache and chest pain. Accidentally took a double dose of his medications today. Symptoms improved and he was released. Was in the ED 08/13/21 due to left wrist gout where he was evaluated and released.   Admitted 11/24/21 due to slurred speech and facial droop. Code stroke initiated on arrival. Low NIH, not a candidate for TNK. Discussed with neurology who recommends hospitalization for further stroke work-up. MRI is positive for acute infarct. CTA H&N showed severe multifocal stenosis. Held coreg , Entresto , Lasix , hydralazine  and Imdur  for permissive HTN, to be resumed after discharge. 01/25/22 had cerebral angiogram with intervention for RICA stenosis.    Admitted 12/06/22 due to SOB, fatigue, dizziness and found to have acute blood loss. Admission hemoglobin was 6.7. IV PPI given along with blood transfusions. Plavix  held. IV iron  given. Echo 12/07/22: EF 55% with moderate MR, mild AR. To have outpatient GI appt for colonoscopy / possible EGD and to address resumption of plavix .   Echo 01/03/23: EF 50-55% with moderate LVH, Grade I DD, mild MR and normal PA pressure of 17.2 mmHg  He presents today for a HF follow-up visit with a chief complaint of minimal fatigue. Denies shortness of breath, chest pain, cough, palpitations, abdominal distention, pedal edema, dizziness or difficulty sleeping.   Drinking 4-5 water bottles (16.9 oz each) and 20 oz soda daily. Has been eating watermelon recently as well.   Previous cardiac studies:  RHC/LHC done 11/15/20 and showed: Ost LM lesion is 20% stenosed.   Prox Cx to Mid Cx lesion is 99% stenosed.   Mid Cx to Dist Cx lesion is 99% stenosed.   1st Mrg lesion is 30% stenosed.   Mid LAD lesion is 100% stenosed.   Prox RCA lesion is 100% stenosed.   Origin lesion is 100% stenosed.   SVG.   LIMA graft was visualized by angiography and is normal in caliber.   The graft exhibits no disease. 1.  Significant underlying three-vessel coronary artery disease with occluded mid LAD, occluded proximal right coronary artery and subtotal occlusion of the mid left circumflex after the origin of a large high OM1 which supplies the majority of the left circumflex distribution. Patent LIMA to LAD with occluded SVG to RCA and SVG to OM. The RCA gets left-to-right collaterals. 2.  Left ventricular angiography was not performed due to chronic kidney disease. 3.  Right heart catheterization showed high normal filling pressures, moderate pulmonary hypertension and moderately to severely reduced cardiac output.  Wore zio monitor 10/24: Wear time: 13 days, 23 hours.  Min HR: 48BPM, Max 190BPM, average HR of 77BPM.   Predominant rhythm was normal sinus rhythm with first degree AVB.  50 SVT runs with longest interval lasting 20.5s at 111BPM.  Infrequent NSVT (x2 runs, 5 beats) PVC burden less than 2%.  ROS: All systems negative except as listed in HPI, PMH and Problem List.  SH:  Social History   Socioeconomic History   Marital status: Widowed    Spouse name: Not on file   Number of children: Not on file   Years of education: Not on file   Highest education level: Not on file  Occupational History   Not on file  Tobacco Use   Smoking status: Former   Smokeless tobacco: Never  Vaping Use   Vaping status: Never Used  Substance and Sexual Activity   Alcohol use: No   Drug use: No   Sexual activity: Not on file  Other Topics Concern   Not on file  Social History Narrative   Not on file   Social Drivers of Health   Financial Resource Strain: Low Risk  (11/30/2022)   Overall Financial Resource Strain (CARDIA)    Difficulty of Paying Living Expenses: Not very hard  Food Insecurity: No Food Insecurity (11/30/2022)   Hunger Vital Sign    Worried About Running Out of Food in the Last Year: Never true    Ran Out of Food in the Last Year: Never true  Transportation Needs: No Transportation Needs (11/30/2022)   PRAPARE - Administrator, Civil Service (Medical): No    Lack of Transportation (Non-Medical): No  Physical Activity: Inactive (11/30/2022)   Exercise Vital Sign    Days of Exercise per Week: 0 days    Minutes of Exercise per Session: 0 min  Stress: No Stress Concern Present (11/30/2022)   [employer] of Occupational Health - Occupational Stress Questionnaire    Feeling of Stress : Not at all  Social Connections: Socially Isolated (11/30/2022)   Social Connection and Isolation Panel    Frequency of Communication with Friends and Family: More than three times a week    Frequency of Social Gatherings with Friends and Family: More than three times a week    Attends  Religious Services: Never    Database administrator or Organizations: No    Attends Banker Meetings: Never    Marital Status: Widowed  Intimate Partner Violence: Not At Risk (11/30/2022)   Humiliation, Afraid, Rape, and Kick questionnaire    Fear of Current or Ex-Partner: No    Emotionally Abused: No    Physically Abused: No    Sexually Abused: No    FH: No family history on file.  Past Medical History:  Diagnosis Date   Arthritis    CHF (congestive heart failure) (HCC)    Chronic HFrEF (heart failure with reduced ejection fraction) (HCC)    a. 10/2020 Echo: EF 25-30%, nl RV fxn, mild MR.   CKD (chronic kidney disease), stage III (HCC)    Coronary artery disease    a. 2002 s/p CABG x 2: LIMA->LAD, VG->RPDA; b. 11/2020 Cath: LM 20ost, LAD 191m, LCX 99p/m, 2m/d, OM1 30, RCA 100p - fills via collats from LAD. RPL1 fills via collats  from LCX. VG->PDA 100, LIMA->LAD ok-->med rx.   Gout    Hyperlipidemia LDL goal <70    Hypertension    Ischemic cardiomyopathy    a. 10/2020 Echo: EF 25-30%.   Osteoarthritis     Current Outpatient Medications  Medication Sig Dispense Refill   allopurinol  (ZYLOPRIM ) 100 MG tablet TAKE 1 TABLET BY MOUTH EVERY DAY 90 tablet 3   atorvastatin  (LIPITOR) 40 MG tablet TAKE 1 TABLET BY MOUTH EVERY DAY 90 tablet 0   Blood Pressure Monitoring (ADULT BLOOD PRESSURE CUFF LG) KIT 1 each by Does not apply route as needed (to check blood pressure). 1 kit 0   carvedilol  (COREG ) 3.125 MG tablet TAKE 1 TABLET BY MOUTH TWICE A DAY WITH FOOD 180 tablet 2   clopidogrel  (PLAVIX ) 75 MG tablet Take 1 tablet (75 mg total) by mouth daily. 90 tablet 1   dapagliflozin  propanediol (FARXIGA ) 10 MG TABS tablet Take 1 tablet (10 mg total) by mouth daily. 90 tablet 3   ezetimibe  (ZETIA ) 10 MG tablet Take 1 tablet (10 mg total) by mouth daily. 90 tablet 3   ferrous sulfate  325 (65 FE) MG tablet TAKE 1 TABLET BY MOUTH EVERY DAY 90 tablet 1   furosemide  (LASIX ) 20 MG tablet  TAKE 1 TABLET BY MOUTH DAILY AS NEEDED (AS NEEDED FOR WEIGHT GAIN OF 2 POUNDS OVERNIGHT). 90 tablet 0   hydrALAZINE  (APRESOLINE ) 50 MG tablet Take 1 tablet (50 mg total) by mouth in the morning and at bedtime. 180 tablet 1   isosorbide  mononitrate (IMDUR ) 30 MG 24 hr tablet TAKE 1 TABLET BY MOUTH EVERY DAY 90 tablet 0   losartan  (COZAAR ) 25 MG tablet Take 1 tablet (25 mg total) by mouth daily. 90 tablet 3   tiZANidine  (ZANAFLEX ) 4 MG tablet Take 0.5-1.5 tablets (2-6 mg total) by mouth every 8 (eight) hours as needed for muscle spasms (muscle tightness). 90 tablet 0   No current facility-administered medications for this visit.   Vitals:   08/09/23 1340  BP: (!) 122/56  Pulse: 68  SpO2: 97%  Weight: 218 lb (98.9 kg)   Wt Readings from Last 3 Encounters:  08/09/23 218 lb (98.9 kg)  07/18/23 214 lb 3.2 oz (97.2 kg)  03/15/23 219 lb 12.8 oz (99.7 kg)   Lab Results  Component Value Date   CREATININE 1.73 (H) 07/18/2023   CREATININE 1.47 (H) 02/15/2023   CREATININE 1.56 (H) 01/03/2023    PHYSICAL EXAM:  General: Well appearing. No resp difficulty HEENT: normal Neck: supple, no JVD Cor: Regular rhythm, rate. No rubs, gallops or murmurs Lungs: clear Abdomen: soft, nontender, nondistended. Extremities: no cyanosis, clubbing, rash, trace pedal edema bilateral shins Neuro: alert & oriented X 3. Moves all 4 extremities w/o difficulty. Affect pleasant   ECG: not done   ASSESSMENT & PLAN:  1: NICM with preserved ejection fraction- - suspect due to HTN - NYHA class II - euvolemic today - weight down 2 pounds from last visit here 6 months ago - Echo 11/11/20: EF 25-30% along with mild MR.  - Echo 12/07/22: EF 55% with moderate MR, mild AR (done @ [facility]) - Echo 01/03/23: EF 50-55% with moderate LVH, Grade I DD, mild MR and normal PA pressure of 17.2 mmHg - not adding salt and friend (daughter) does most of the cooking and doesn't cook with salt either - continue carvedilol  3.125mg   BID (was unable to tolerate metoprolol ) - continue farxiga  10mg  daily - continue furosemide  20mg  PRN; has not taken recently -  continue hydralazine  50mg  BID/ isosorbide  MN 30mg  daily - continue losartan  25mg  daily - potassium has been >5.0 so may not tolerate MRA - unable to tolerate entresto  - proBNT 12/07/22 was 387.0  2: HTN with CKD- - BP 122/56 - saw PCP Murlean) 06/25 - BMP 07/18/23 reviewed: sodium 139, potassium 4.6, creatinine 1.73 and GFR 40 - saw nephrology (Kolluru) 06/25  3: CAD- - CABG back in 2002 - saw cardiology Florestine) 12/24 - continue clopidogrel  75mg  daily - RHC/LHC done 11/15/20 and showed: Ost LM lesion is 20% stenosed.   Prox Cx to Mid Cx lesion is 99% stenosed.   Mid Cx to Dist Cx lesion is 99% stenosed.   1st Mrg lesion is 30% stenosed.   Mid LAD lesion is 100% stenosed.   Prox RCA lesion is 100% stenosed.   Origin lesion is 100% stenosed.   SVG.   LIMA graft was visualized by angiography and is normal in caliber.   The graft exhibits no disease. 1.  Significant underlying three-vessel coronary artery disease with occluded mid LAD, occluded proximal right coronary artery and subtotal occlusion of the mid left circumflex after the origin of a large high OM1 which supplies the majority of the left circumflex distribution. Patent LIMA to LAD with occluded SVG to RCA and SVG to OM. The RCA gets left-to-right collaterals. 2.  Left ventricular angiography was not performed due to chronic kidney disease. 3.  Right heart catheterization showed high normal filling pressures, moderate pulmonary hypertension and moderately to severely reduced cardiac output.  4: Stroke- - 01/25/22 had cerebral angiogram with intervention for RICA stenosis - has not  had f/u with Dr. Dolphus, interventional radiology   5: Diabetes- - A1c 07/18/23 was 7.8%  6: Anemia- - received blood transfusions during 10/24 admission - HG 07/18/23 was 14.0 - saw GI Elodie) 01/25 & had  subsequent EGD; declined colonoscopy   7: Hyperlipidemia- - LDL 07/18/23 was 54 - continue atorvastatin  40mg  daily/ zetia  10mg  daily   Return in 6 months, sooner if needed.   Ellouise DELENA Class, [name] 08/09/23

## 2023-08-11 ENCOUNTER — Encounter (HOSPITAL_COMMUNITY): Payer: Self-pay | Admitting: Interventional Radiology

## 2023-09-26 ENCOUNTER — Other Ambulatory Visit: Payer: Self-pay | Admitting: Nurse Practitioner

## 2023-09-26 DIAGNOSIS — I5020 Unspecified systolic (congestive) heart failure: Secondary | ICD-10-CM

## 2023-09-26 DIAGNOSIS — I2510 Atherosclerotic heart disease of native coronary artery without angina pectoris: Secondary | ICD-10-CM

## 2023-09-26 DIAGNOSIS — Z951 Presence of aortocoronary bypass graft: Secondary | ICD-10-CM

## 2023-09-28 NOTE — Telephone Encounter (Signed)
 Requested Prescriptions  Refused Prescriptions Disp Refills   furosemide  (LASIX ) 20 MG tablet [Pharmacy Med Name: FUROSEMIDE  20 MG TABLET] 90 tablet 0    Sig: TAKE 1 TABLET BY MOUTH DAILY AS NEEDED (AS NEEDED FOR WEIGHT GAIN OF 2 POUNDS OVERNIGHT).     Cardiovascular:  Diuretics - Loop Failed - 09/28/2023  4:17 PM      Failed - Cr in normal range and within 180 days    Creat  Date Value Ref Range Status  07/18/2023 1.73 (H) 0.70 - 1.28 mg/dL Final   Creatinine, Urine  Date Value Ref Range Status  07/18/2023 170 20 - 320 mg/dL Final         Failed - Mg Level in normal range and within 180 days    Magnesium   Date Value Ref Range Status  11/25/2021 2.1 1.7 - 2.4 mg/dL Final    Comment:    Performed at [facility], [address] [place], [place] [address]         Passed - K in normal range and within 180 days    Potassium  Date Value Ref Range Status  07/18/2023 4.6 3.5 - 5.3 mmol/L Final  09/20/2013 3.5 3.5 - 5.1 mmol/L Final         Passed - Ca in normal range and within 180 days    Calcium   Date Value Ref Range Status  07/18/2023 9.4 8.6 - 10.3 mg/dL Final   Calcium , Total  Date Value Ref Range Status  09/20/2013 9.0 8.5 - 10.1 mg/dL Final         Passed - Na in normal range and within 180 days    Sodium  Date Value Ref Range Status  07/18/2023 139 135 - 146 mmol/L Final  02/15/2023 142 134 - 144 mmol/L Final  09/20/2013 138 136 - 145 mmol/L Final         Passed - Cl in normal range and within 180 days    Chloride  Date Value Ref Range Status  07/18/2023 105 98 - 110 mmol/L Final  09/20/2013 106 98 - 107 mmol/L Final         Passed - Last BP in normal range    BP Readings from Last 1 Encounters:  08/09/23 (!) 122/56         Passed - Valid encounter within last 6 months    Recent Outpatient Visits           2 months ago Coronary artery disease involving native coronary artery of native heart without angina pectoris   [facility] Gareth Mliss [place], [place]

## 2023-11-03 ENCOUNTER — Other Ambulatory Visit: Payer: Self-pay | Admitting: Nurse Practitioner

## 2023-11-03 ENCOUNTER — Other Ambulatory Visit: Payer: Self-pay | Admitting: Family

## 2023-11-03 ENCOUNTER — Other Ambulatory Visit: Payer: Self-pay | Admitting: Cardiovascular Disease

## 2023-11-03 DIAGNOSIS — Z951 Presence of aortocoronary bypass graft: Secondary | ICD-10-CM

## 2023-11-03 DIAGNOSIS — I2510 Atherosclerotic heart disease of native coronary artery without angina pectoris: Secondary | ICD-10-CM

## 2023-11-03 DIAGNOSIS — I5020 Unspecified systolic (congestive) heart failure: Secondary | ICD-10-CM

## 2023-11-05 NOTE — Telephone Encounter (Signed)
 Lipid panel in date.  Requested Prescriptions  Pending Prescriptions Disp Refills   isosorbide  mononitrate (IMDUR ) 30 MG 24 hr tablet [Pharmacy Med Name: ISOSORBIDE  MONONIT ER 30 MG TB] 90 tablet 0    Sig: TAKE 1 TABLET BY MOUTH EVERY DAY     Cardiovascular:  Nitrates Passed - 11/05/2023  1:36 PM      Passed - Last BP in normal range    BP Readings from Last 1 Encounters:  08/09/23 (!) 122/56         Passed - Last Heart Rate in normal range    Pulse Readings from Last 1 Encounters:  08/09/23 68         Passed - Valid encounter within last 12 months    Recent Outpatient Visits           3 months ago Coronary artery disease involving native coronary artery of native heart without angina pectoris   [facility] [place], Mliss FAL[place], [name]               atorvastatin  (LIPITOR) 40 MG tablet [Pharmacy Med Name: ATORVASTATIN  40 MG TABLET] 90 tablet 0    Sig: TAKE 1 TABLET BY MOUTH EVERY DAY     Cardiovascular:  Antilipid - Statins Failed - 11/05/2023  1:36 PM      Failed - Lipid Panel in normal range within the last 12 months    Cholesterol  Date Value Ref Range Status  07/18/2023 104 <200 mg/dL Final   LDL Cholesterol (Calc)  Date Value Ref Range Status  07/18/2023 54 mg/dL (calc) Final    Comment:    Reference range: <100 . Desirable range <100 mg/dL for primary prevention;   <70 mg/dL for patients with CHD or diabetic patients  with > or = 2 CHD risk factors. [facility] LDL-C is now calculated using the Martin-Hopkins  calculation, which is a validated novel method providing  better accuracy than the Friedewald equation in the  estimation of LDL-C.  Gladis APPLETHWAITE et al. SANDREA. 7986;689(80): 2061-2068  (http://education.[facility].com/faq/FAQ164)    HDL  Date Value Ref Range Status  07/18/2023 22 (L) > OR = 40 mg/dL Final   Triglycerides  Date Value Ref Range Status  07/18/2023 221 (H) <150 mg/dL Final    Comment:    . If a non-fasting specimen  was collected, consider repeat triglyceride testing on a fasting specimen if clinically indicated.  Veatrice et al. J. of Clin. Lipidol. 2015;9:129-169. SABRA          Passed - Patient is not pregnant      Passed - Valid encounter within last 12 months    Recent Outpatient Visits           3 months ago Coronary artery disease involving native coronary artery of native heart without angina pectoris   [facility] Gareth Mliss [place], [place]

## 2023-11-14 NOTE — Progress Notes (Deleted)
 There were no vitals taken for this visit.   Subjective:    Patient ID: Benjamin Valentine, male    DOB: March 01, 1945, 78 y.o.   MRN: [id number]  HPI: Benjamin Valentine is a 78 y.o. male presenting today for a 4 month follow-up for chronic medical management. His medical history includes heart failure, coronary artery disease, hypertension, COPD, type 2 diabetes, CKD, osteoarthritis, hyperlipidemia, gout, iron  deficiency anemia, B12 deficiency, obesity, and hx of TIA.  He is established with nephrology and cardiology.   His current medications include allopurinol  100 mg daily, atorvastatin  40 mg daily, carvedilol  3.125 mg twice daily, Plavix  75 mg daily, Farxiga  10 mg daily, Zetia  10 mg daily, Lasix  20 mg as needed, hydralazine  50 mg twice daily, Imdur  30 mg daily and losartan  25 mg daily.   He reports not checking his blood sugar or blood pressure regularly.            07/18/2023    2:05 PM 01/29/2023   10:29 AM 11/30/2022    1:19 PM  Depression screen PHQ 2/9  Decreased Interest 0 0 0  Down, Depressed, Hopeless 0 0 0  PHQ - 2 Score 0 0 0  Altered sleeping 0 0   Tired, decreased energy 0 0   Change in appetite 0 0   Feeling bad or failure about yourself  0 0   Trouble concentrating 0 0   Moving slowly or fidgety/restless 0 0   Suicidal thoughts 0 0   PHQ-9 Score 0 0   Difficult doing work/chores Not difficult at all      Relevant past medical, surgical, family and social history reviewed and updated as indicated. Interim medical history since our last visit reviewed. Allergies and medications reviewed and updated.  Review of Systems  Per HPI unless specifically indicated above     Objective:     There were no vitals taken for this visit.  {Vitals History (Optional):23777} Wt Readings from Last 3 Encounters:  08/09/23 218 lb (98.9 kg)  07/18/23 214 lb 3.2 oz (97.2 kg)  03/15/23 219 lb 12.8 oz (99.7 kg)    Physical Exam   Results for orders placed or performed in visit  on 07/18/23  POCT urinalysis dipstick   Collection Time: 07/18/23  2:39 PM  Result Value Ref Range   Color, UA yellow    Clarity, UA clear    Glucose, UA Positive (A) Negative   Bilirubin, UA negative    Ketones, UA negative    Spec Grav, UA 1.015 1.010 - 1.025   Blood, UA negative    pH, UA 5.0 5.0 - 8.0   Protein, UA Negative Negative   Urobilinogen, UA 0.2 0.2 or 1.0 E.U./dL   Nitrite, UA negative    Leukocytes, UA Negative Negative   Appearance clear    Odor normal   CBC with Differential/Platelet   Collection Time: 07/18/23  2:48 PM  Result Value Ref Range   WBC 6.2 3.8 - 10.8 Thousand/uL   RBC 5.12 4.20 - 5.80 Million/uL   Hemoglobin 14.0 13.2 - 17.1 g/dL   HCT 55.0 61.4 - 49.9 %   MCV 87.7 80.0 - 100.0 fL   MCH 27.3 27.0 - 33.0 pg   MCHC 31.2 (L) 32.0 - 36.0 g/dL   RDW 84.9 88.9 - 84.9 %   Platelets 307 140 - 400 Thousand/uL   MPV 11.3 7.5 - 12.5 fL   Neutro Abs 3,174 1,500 - 7,800 cells/uL   Absolute Lymphocytes  2,046 850 - 3,900 cells/uL   Absolute Monocytes 794 200 - 950 cells/uL   Eosinophils Absolute 167 15 - 500 cells/uL   Basophils Absolute 19 0 - 200 cells/uL   Neutrophils Relative % 51.2 %   Total Lymphocyte 33.0 %   Monocytes Relative 12.8 %   Eosinophils Relative 2.7 %   Basophils Relative 0.3 %  Comprehensive metabolic panel with GFR   Collection Time: 07/18/23  2:48 PM  Result Value Ref Range   Glucose, Bld 103 (H) 65 - 99 mg/dL   BUN 23 7 - 25 mg/dL   Creat 8.26 (H) 9.29 - 1.28 mg/dL   eGFR 40 (L) > OR = 60 mL/min/1.4m2   BUN/Creatinine Ratio 13 6 - 22 (calc)   Sodium 139 135 - 146 mmol/L   Potassium 4.6 3.5 - 5.3 mmol/L   Chloride 105 98 - 110 mmol/L   CO2 26 20 - 32 mmol/L   Calcium  9.4 8.6 - 10.3 mg/dL   Total Protein 7.4 6.1 - 8.1 g/dL   Albumin  4.2 3.6 - 5.1 g/dL   Globulin 3.2 1.9 - 3.7 g/dL (calc)   AG Ratio 1.3 1.0 - 2.5 (calc)   Total Bilirubin 0.3 0.2 - 1.2 mg/dL   Alkaline phosphatase (APISO) 57 35 - 144 U/L   AST 15 10 - 35  U/L   ALT 12 9 - 46 U/L  Lipid panel   Collection Time: 07/18/23  2:48 PM  Result Value Ref Range   Cholesterol 104 <200 mg/dL   HDL 22 (L) > OR = 40 mg/dL   Triglycerides 778 (H) <150 mg/dL   LDL Cholesterol (Calc) 54 mg/dL (calc)   Total CHOL/HDL Ratio 4.7 <5.0 (calc)   Non-HDL Cholesterol (Calc) 82 <869 mg/dL (calc)  Microalbumin / creatinine urine ratio   Collection Time: 07/18/23  2:48 PM  Result Value Ref Range   Creatinine, Urine 170 20 - 320 mg/dL   Microalb, Ur 2.7 mg/dL   Microalb Creat Ratio 16 <30 mg/g creat  Hemoglobin A1c   Collection Time: 07/18/23  2:48 PM  Result Value Ref Range   Hgb A1c MFr Bld 7.8 (H) <5.7 %   Mean Plasma Glucose 177 mg/dL   eAG (mmol/L) 9.8 mmol/L  Iron , TIBC and Ferritin Panel   Collection Time: 07/18/23  2:48 PM  Result Value Ref Range   Iron  164 50 - 180 mcg/dL   TIBC 616 749 - 574 mcg/dL (calc)   %SAT 43 20 - 48 % (calc)   Ferritin 16 (L) 24 - 380 ng/mL  Vitamin B12   Collection Time: 07/18/23  2:48 PM  Result Value Ref Range   Vitamin B-12 213 200 - 1,100 pg/mL  PSA   Collection Time: 07/18/23  2:48 PM  Result Value Ref Range   PSA 2.02 < OR = 4.00 ng/mL   {Labs (Optional):23779}       Assessment & Plan:   Problem List Items Addressed This Visit   None    Assessment and Plan         Follow up plan: No follow-ups on file.

## 2023-11-15 ENCOUNTER — Ambulatory Visit: Admitting: Nurse Practitioner

## 2023-11-15 DIAGNOSIS — I2510 Atherosclerotic heart disease of native coronary artery without angina pectoris: Secondary | ICD-10-CM

## 2023-11-15 DIAGNOSIS — E538 Deficiency of other specified B group vitamins: Secondary | ICD-10-CM

## 2023-11-15 DIAGNOSIS — D649 Anemia, unspecified: Secondary | ICD-10-CM

## 2023-11-15 DIAGNOSIS — M19071 Primary osteoarthritis, right ankle and foot: Secondary | ICD-10-CM

## 2023-11-15 DIAGNOSIS — I10 Essential (primary) hypertension: Secondary | ICD-10-CM

## 2023-11-15 DIAGNOSIS — E1122 Type 2 diabetes mellitus with diabetic chronic kidney disease: Secondary | ICD-10-CM

## 2023-11-15 DIAGNOSIS — Z8673 Personal history of transient ischemic attack (TIA), and cerebral infarction without residual deficits: Secondary | ICD-10-CM

## 2023-11-28 ENCOUNTER — Encounter: Payer: Self-pay | Admitting: Nurse Practitioner

## 2023-11-28 ENCOUNTER — Ambulatory Visit: Admitting: Nurse Practitioner

## 2023-11-28 VITALS — BP 132/80 | HR 75 | Temp 97.8°F | Ht 69.0 in | Wt 217.0 lb

## 2023-11-28 DIAGNOSIS — J449 Chronic obstructive pulmonary disease, unspecified: Secondary | ICD-10-CM | POA: Diagnosis not present

## 2023-11-28 DIAGNOSIS — M19071 Primary osteoarthritis, right ankle and foot: Secondary | ICD-10-CM | POA: Diagnosis not present

## 2023-11-28 DIAGNOSIS — I10 Essential (primary) hypertension: Secondary | ICD-10-CM | POA: Diagnosis not present

## 2023-11-28 DIAGNOSIS — E1122 Type 2 diabetes mellitus with diabetic chronic kidney disease: Secondary | ICD-10-CM

## 2023-11-28 DIAGNOSIS — N1832 Chronic kidney disease, stage 3b: Secondary | ICD-10-CM | POA: Diagnosis not present

## 2023-11-28 DIAGNOSIS — M1A3790 Chronic gout due to renal impairment, unspecified ankle and foot, without tophus (tophi): Secondary | ICD-10-CM

## 2023-11-28 DIAGNOSIS — I5020 Unspecified systolic (congestive) heart failure: Secondary | ICD-10-CM | POA: Diagnosis not present

## 2023-11-28 DIAGNOSIS — D509 Iron deficiency anemia, unspecified: Secondary | ICD-10-CM

## 2023-11-28 DIAGNOSIS — Z8673 Personal history of transient ischemic attack (TIA), and cerebral infarction without residual deficits: Secondary | ICD-10-CM

## 2023-11-28 DIAGNOSIS — I2510 Atherosclerotic heart disease of native coronary artery without angina pectoris: Secondary | ICD-10-CM | POA: Diagnosis not present

## 2023-11-28 DIAGNOSIS — E538 Deficiency of other specified B group vitamins: Secondary | ICD-10-CM

## 2023-11-28 DIAGNOSIS — E785 Hyperlipidemia, unspecified: Secondary | ICD-10-CM

## 2023-11-28 DIAGNOSIS — N1831 Chronic kidney disease, stage 3a: Secondary | ICD-10-CM

## 2023-11-28 DIAGNOSIS — Z951 Presence of aortocoronary bypass graft: Secondary | ICD-10-CM

## 2023-11-28 DIAGNOSIS — I5032 Chronic diastolic (congestive) heart failure: Secondary | ICD-10-CM | POA: Diagnosis not present

## 2023-11-28 DIAGNOSIS — D500 Iron deficiency anemia secondary to blood loss (chronic): Secondary | ICD-10-CM

## 2023-11-28 LAB — POCT GLYCOSYLATED HEMOGLOBIN (HGB A1C): Hemoglobin A1C: 7.8 % — AB (ref 4.0–5.6)

## 2023-11-28 LAB — OPHTHALMOLOGY REPORT-SCANNED

## 2023-11-28 MED ORDER — FUROSEMIDE 20 MG PO TABS: 20.0000 mg | ORAL_TABLET | Freq: Every day | ORAL | 1 refills | Status: AC | PRN

## 2023-11-28 MED ORDER — RYBELSUS 3 MG PO TABS: 3.0000 mg | ORAL_TABLET | Freq: Every day | ORAL | 1 refills | Status: AC

## 2023-11-28 NOTE — Assessment & Plan Note (Signed)
 Tales Allopurinol  100 mg daily. No recent gout flare-ups.

## 2023-11-28 NOTE — Assessment & Plan Note (Signed)
 Takes Carvedilol  3.125 mg BID, Hydralazine  50 mg BID, and Losartan  25 mg daily for HTN. Does not check BP at home. BP was rechecked and was 132/80. Has an upcoming appointment with cardiology.

## 2023-11-28 NOTE — Assessment & Plan Note (Signed)
 Takes Plavix  75 mg daily. Denies any side effects or abnormal bleeding.

## 2023-11-28 NOTE — Assessment & Plan Note (Signed)
 Takes Ezetimibe  10 mg daily and Atorvastatin  40 daily. Educated patient about consuming a healthy diet and exercising. Eating a variety of foods including fruits, vegetables, and whole grains.

## 2023-11-28 NOTE — Assessment & Plan Note (Signed)
 Takes Ferrous sulfate  325 mg daily.

## 2023-11-28 NOTE — Assessment & Plan Note (Signed)
 Takes Farxiga  10 mg daily. Tolerating medication well and reports no side effects. Seen by nephrology.

## 2023-11-28 NOTE — Progress Notes (Signed)
 BP 132/80   Pulse 75   Temp 97.8 F (36.6 C)   Ht 5' 9 (1.753 m)   Wt 217 lb (98.4 kg)   SpO2 96%   BMI 32.05 kg/m    Subjective:    Patient ID: Benjamin Valentine, male    DOB: 01/14/1946, 78 y.o.   MRN: [id number]  HPI: Benjamin Valentine is a 78 y.o. male  Chief Complaint  Patient presents with   Follow-up   Medication Refill    lasix     Hypertension: -Medications: Carvedilol  3.125 mg BID, Hydralazine  50 mg BID, Losartan  25 mg daily -Patient is compliant with above medications and reports no side effects. -Patient does not check his blood pressure at home. -Denies any SOB, CP, vision changes, LE edema, or symptoms of hypotension. -Diet: Regular diet, but enjoys consuming sweets. Discussed with patient of cutting down on sweets. -Exercise: Walking -Blood pressure was initially 148/80 during this visit, but was rechecked and was 132/80. Patient has an upcoming appointment with cardiology.  HLD: -Medications: Ezetimibe  10 mg daily, Atorvastatin  40 daily  -Patient is compliant with above medications and reports no side effects.  -Last lipid panel: 07/18/2023  Diabetes, Type 2: -Last A1c: 7.8 (07/18/2023); POCT A1c was rechecked during this visit and was 7.8. -Medications: Farxiga  10 mg daily -Patient is compliant with the above medications and reports no side effects.  -Patient does not currently monitor his blood glucose at home and declined initiating home glucose monitoring. Discussed with patient of adding an additional medication for his diabetes. He declined injectable medications, but would be willing to start an oral medication, such as Rybelsus. Discussed with patient that Metformin  cannot be prescribed due to decreased renal function. Most recent GFR was 40 (07/18/2023). -Foot exam: Completed 07/18/2023 -Microalbumin: Completed 07/18/2023 -Statin: Atorvastatin  40 mg daily -Denies symptoms of hypoglycemia, polyuria, polydipsia, numbness extremities, foot ulcers/trauma.    Congestive Heart Failure with Reduced Ejection Fraction and Coronary Artery Disease: -Medications: Isosorbide  mononitrate 30 mg daily, Furosemide  20 mg daily PRN, Farxiga  10 mg daily -Patient is compliant with above medications and reports no side effects. -Denies any shortness of breath, fatigue, cough, chest pain, or dizziness.  Chronic Kidney Disease, Stage 3b: -Medications: Farxiga  10 mg daily -Patient is compliant with above medications and reports no side effects. -Denies any weight gain, fatigue, lower extremity edema, or shortness of breath. -managed by nephrology  History of TIA: -Medications: Plavix  75 mg daily -Patient is compliant with above medications and reports no side effects. -Denies any nose bleeds, bruising, headaches, or abnormal bleeding.  Iron  Deficiency Anemia: -Medications: Ferrous sulfate  325 mg daily -Patient is compliant with above medications and reports no side effects. -Denies fatigue, decreased energy, or shortness of breath.  Gout: -Medications: Allopurinol  100 mg daily -Patient is compliant with above medications and reports no side effects. -Denies any recent gout flare-ups.  Osteoarthritis: -Medications: Patient is not currently taking Zanaflex  4 mg q8h PRN. Verbalizes that no longer has hip pain. -Patient is compliant with above medications and reports no side effects.  COPD: -Denies any shortness of breath, cough, chest tightness, or wheezing.  Vitamin B12 deficiency: -Patient does not currently take any medications. -Denies any numbness or tingling in extremities, weakness, or shortness of breath.   PHQ-9 is negative for depression.    11/28/2023    1:16 PM 07/18/2023    2:05 PM 01/29/2023   10:29 AM  Depression screen PHQ 2/9  Decreased Interest 0 0 0  Down, Depressed, Hopeless  0 0 0  PHQ - 2 Score 0 0 0  Altered sleeping 0 0 0  Tired, decreased energy 0 0 0  Change in appetite 0 0 0  Feeling bad or failure about yourself  0  0 0  Trouble concentrating 0 0 0  Moving slowly or fidgety/restless 0 0 0  Suicidal thoughts 0 0 0  PHQ-9 Score 0 0 0  Difficult doing work/chores Not difficult at all Not difficult at all     Relevant past medical, surgical, family and social history reviewed and updated as indicated. Interim medical history since our last visit reviewed. Allergies and medications reviewed and updated.  Review of Systems  Constitutional: Negative for fever or weight change.  Respiratory: Negative for cough and shortness of breath.   Cardiovascular: Negative for chest pain or palpitations.  Gastrointestinal: Negative for abdominal pain, no bowel changes.  Musculoskeletal: Negative for gait problem or joint swelling.  Skin: Negative for rash.  Neurological: Negative for dizziness or headache.  No other specific complaints in a complete review of systems (except as listed in HPI above).     Objective:     BP 132/80   Pulse 75   Temp 97.8 F (36.6 C)   Ht 5' 9 (1.753 m)   Wt 217 lb (98.4 kg)   SpO2 96%   BMI 32.05 kg/m    Wt Readings from Last 3 Encounters:  11/28/23 217 lb (98.4 kg)  08/09/23 218 lb (98.9 kg)  07/18/23 214 lb 3.2 oz (97.2 kg)    Physical Exam Constitutional:      Appearance: Normal appearance.  Cardiovascular:     Rate and Rhythm: Normal rate and regular rhythm.     Heart sounds: Normal heart sounds.  Pulmonary:     Effort: Pulmonary effort is normal.     Breath sounds: Normal breath sounds.  Skin:    General: Skin is warm and dry.  Neurological:     General: No focal deficit present.     Mental Status: He is alert and oriented to person, place, and time. Mental status is at baseline.  Psychiatric:        Mood and Affect: Mood normal.        Behavior: Behavior normal.        Thought Content: Thought content normal.        Judgment: Judgment normal.    Results for orders placed or performed in visit on 11/28/23  POCT HgB A1C   Collection Time: 11/28/23   1:57 PM  Result Value Ref Range   Hemoglobin A1C 7.8 (A) 4.0 - 5.6 %   HbA1c POC (<> result, manual entry)     HbA1c, POC (prediabetic range)     HbA1c, POC (controlled diabetic range)            Assessment & Plan:   Problem List Items Addressed This Visit       Cardiovascular and Mediastinum   Essential hypertension   Takes Carvedilol  3.125 mg BID, Hydralazine  50 mg BID, and Losartan  25 mg daily for HTN. Does not check BP at home. BP was rechecked and was 132/80. Has an upcoming appointment with cardiology.      Relevant Medications   furosemide  (LASIX ) 20 MG tablet   Coronary artery disease involving native coronary artery of native heart without angina pectoris   Relevant Medications   furosemide  (LASIX ) 20 MG tablet   HFrEF (heart failure with reduced ejection fraction) (HCC) - Primary  Takes Isosorbide  mononitrate 30 mg daily, Furosemide  20 mg daily PRN, and Farxiga  10 mg daily. Denies any side effects or symptoms. Has an upcoming appointment with cardiology and is seen by heart failure clinic.      Relevant Medications   furosemide  (LASIX ) 20 MG tablet     Respiratory   COPD (chronic obstructive pulmonary disease) (HCC)   Denies any COPD exacerbations, including shortness of breath or difficulty breathing.        Endocrine   Type 2 diabetes mellitus with diabetic chronic kidney disease (HCC) (Chronic)   POCT HbA1c during this visit was 7.8. Currently takes Farxiga  10 mg daily. Does not check blood glucose at home. Rybelsus 3 mg daily ordered. Unable to prescribe Metformin  as most recent GFR was 40.      Relevant Medications   Semaglutide (RYBELSUS) 3 MG TABS   Other Relevant Orders   POCT HgB A1C (Completed)     Musculoskeletal and Integument   Chronic gout due to renal impairment involving foot without tophus   Tales Allopurinol  100 mg daily. No recent gout flare-ups.       Primary osteoarthritis of both ankles     Genitourinary   Chronic kidney  disease, stage 3b (HCC)   Takes Farxiga  10 mg daily. Tolerating medication well and reports no side effects. Seen by nephrology.        Other   Hx of CABG   Relevant Medications   furosemide  (LASIX ) 20 MG tablet   Hyperlipidemia   Takes Ezetimibe  10 mg daily and Atorvastatin  40 daily. Educated patient about consuming a healthy diet and exercising. Eating a variety of foods including fruits, vegetables, and whole grains.       Relevant Medications   furosemide  (LASIX ) 20 MG tablet   History of TIA (transient ischemic attack)   Takes Plavix  75 mg daily. Denies any side effects or abnormal bleeding.      Iron  deficiency anemia due to chronic blood loss   Takes Ferrous sulfate  325 mg daily.      Other Visit Diagnoses       B12 deficiency         Iron  deficiency anemia, unspecified iron  deficiency anemia type         Chronic diastolic congestive heart failure (HCC)       Relevant Medications   furosemide  (LASIX ) 20 MG tablet     Coronary artery disease, non-occlusive       Relevant Medications   furosemide  (LASIX ) 20 MG tablet               Follow up plan: Return in about 4 months (around 03/30/2024) for follow up.    I have reviewed this encounter including the documentation in this note and/or discussed this patient with the provider, Alexa Everhart SNP, I am certifying that I agree with the content of this note as supervising/preceptor nurse practitioner.  Mliss Spray, [name]-C [facility] 11/28/2023, 2:49 PM

## 2023-11-28 NOTE — Assessment & Plan Note (Signed)
 Takes Isosorbide  mononitrate 30 mg daily, Furosemide  20 mg daily PRN, and Farxiga  10 mg daily. Denies any side effects or symptoms. Has an upcoming appointment with cardiology and is seen by heart failure clinic.

## 2023-11-28 NOTE — Assessment & Plan Note (Signed)
 POCT HbA1c during this visit was 7.8. Currently takes Farxiga  10 mg daily. Does not check blood glucose at home. Rybelsus 3 mg daily ordered. Unable to prescribe Metformin  as most recent GFR was 40.

## 2023-11-28 NOTE — Assessment & Plan Note (Signed)
 Denies any COPD exacerbations, including shortness of breath or difficulty breathing.

## 2023-12-04 ENCOUNTER — Ambulatory Visit: Attending: Medical | Admitting: Medical

## 2023-12-04 ENCOUNTER — Encounter: Payer: Self-pay | Admitting: Medical

## 2023-12-04 VITALS — BP 150/80 | HR 66 | Ht 69.0 in | Wt 218.8 lb

## 2023-12-04 DIAGNOSIS — E782 Mixed hyperlipidemia: Secondary | ICD-10-CM | POA: Diagnosis not present

## 2023-12-04 DIAGNOSIS — I10 Essential (primary) hypertension: Secondary | ICD-10-CM

## 2023-12-04 DIAGNOSIS — I2510 Atherosclerotic heart disease of native coronary artery without angina pectoris: Secondary | ICD-10-CM

## 2023-12-04 DIAGNOSIS — I5032 Chronic diastolic (congestive) heart failure: Secondary | ICD-10-CM | POA: Diagnosis not present

## 2023-12-04 DIAGNOSIS — I5020 Unspecified systolic (congestive) heart failure: Secondary | ICD-10-CM

## 2023-12-04 NOTE — Progress Notes (Signed)
 Cardiology Office Note   Date:  12/04/2023  ID:  Benjamin, Valentine [birth year], MRN [id number] PCP: Gareth Mliss FALCON, [name]  [facility] Providers Cardiologist:  Evalene Lunger, MD   History of Present Illness Benjamin Valentine is a 78 y.o. male with a h/o CAD s/p CABG 2022, HFImpEF, HTN, HLD, OA, gout, acute stroke 02/2021, PAD, carotid stenosis on the right s/p stent assisted angioplasty 01/2022 who presents for follow-up of CAD.   Patient had stroke 01/2022. Cerebral angiogram with intervention for RICA stenosis 01/25/22 started on ASA and Plavix . The patient was admitted 11/2022 to [facility] for acute blood loss Hgb 6.7 and Plavix  was held.   Echo 12/2022 showed LVEF 50-55%, mod LVH, normal RV size and function, no significant valvular heart disease. Heart monitor showed NST, rare SVT longest 20 seconds, PVC burden less than 2%.   The patient was last seen 01/2023 and was stable from a cardiac perspective.   Today, the patient is overall doing well. He denies chest pain, SOB, lower leg edema, orhopnea, heart racing, palpitations, dizziness, lightheadedness. No recent falls. BP is a little high but it's due to walking in. He tries to stay active, but does no formal activity. He takes lasix  as needed.    Studies Reviewed EKG Interpretation Date/Time:  Tuesday December 04 2023 13:57:26 EDT Ventricular Rate:  66 PR Interval:  208 QRS Duration:  106 QT Interval:  458 QTC Calculation: 480 R Axis:   57  Text Interpretation: Normal sinus rhythm Left ventricular hypertrophy with repolarization abnormality ( Sokolow-Lyon , Romhilt-Estes ) Cannot rule out Inferior infarct (cited on or before 16-Jan-2023) When compared with ECG of 16-Jan-2023 15:11, No significant change was found Confirmed by Franchester, Salimata Christenson ([id number]) on 12/04/2023 2:00:59 PM    Echo 12/2022  1. Left ventricular ejection fraction, by estimation, is 50 to 55%. The  left ventricle has low normal function. The left ventricle  demonstrates  regional wall motion abnormalities (hypokinesis of the basal inferior  wall). There is moderate left  ventricular hypertrophy. Left ventricular diastolic parameters are  consistent with Grade I diastolic dysfunction (impaired relaxation).   2. Right ventricular systolic function is normal. The right ventricular  size is normal. There is normal pulmonary artery systolic pressure. The  estimated right ventricular systolic pressure is 17.2 mmHg.   3. The mitral valve is normal in structure. Mild mitral valve  regurgitation. No evidence of mitral stenosis.   4. The aortic valve is tricuspid. There is mild calcification of the  aortic valve. Aortic valve regurgitation is mild. Aortic valve sclerosis  is present, with no evidence of aortic valve stenosis.   5. There is borderline dilatation of the aortic root, measuring 37 mm.   6. The inferior vena cava is normal in size with greater than 50%  respiratory variability, suggesting right atrial pressure of 3 mmHg.   Heart monitor 12/2022 Wear time: 13 days, 23 hours.  Min HR: 48BPM, Max 190BPM, average HR of 77BPM.  Predominant rhythm was normal sinus rhythm with first degree AVB.  50 SVT runs with longest interval lasting 20.5s at 111BPM.  Infrequent NSVT (x2 runs, 5 beats) PVC burden less than 2%.  R/L heart cath 11/2020   Ost LM lesion is 20% stenosed.   Prox Cx to Mid Cx lesion is 99% stenosed.   Mid Cx to Dist Cx lesion is 99% stenosed.   1st Mrg lesion is 30% stenosed.   Mid LAD lesion is 100% stenosed.  Prox RCA lesion is 100% stenosed.   Origin lesion is 100% stenosed.   SVG.   LIMA graft was visualized by angiography and is normal in caliber.   The graft exhibits no disease.   1.  Significant underlying three-vessel coronary artery disease with occluded mid LAD, occluded proximal right coronary artery and subtotal occlusion of the mid left circumflex after the origin of a large high OM1 which supplies the majority  of the left circumflex distribution. Patent LIMA to LAD with occluded SVG to RCA and SVG to OM. The RCA gets left-to-right collaterals. 2.  Left ventricular angiography was not performed due to chronic kidney disease. 3.  Right heart catheterization showed high normal filling pressures, moderate pulmonary hypertension and moderately to severely reduced cardiac output.   Recommendations: No revascularization is recommended for coronary artery disease.  Recommend continuing medical therapy. Oral furosemide  can be resumed tomorrow.   Echo 10/2020 1. Left ventricular ejection fraction, by estimation, is 25 to 30%. The  left ventricle has severely decreased function. The left ventricle  demonstrates global hypokinesis. The left ventricular internal cavity size  was mildly to moderately dilated. Left  ventricular diastolic parameters were normal.   2. Right ventricular systolic function is normal. The right ventricular  size is normal.   3. The mitral valve is normal in structure. Mild mitral valve  regurgitation.   4. The aortic valve is normal in structure. Aortic valve regurgitation is  not visualized.      Physical Exam VS:  BP (!) 150/80 (BP Location: Left Arm, Patient Position: Sitting, Cuff Size: Normal) Comment: walk long to get here  Pulse 66 Comment: 72 oximeter  Ht 5' 9 (1.753 m)   Wt 218 lb 12.8 oz (99.2 kg)   SpO2 98%   BMI 32.31 kg/m        Wt Readings from Last 3 Encounters:  12/04/23 218 lb 12.8 oz (99.2 kg)  11/28/23 217 lb (98.4 kg)  08/09/23 218 lb (98.9 kg)    GEN: Well nourished, well developed in no acute distress NECK: No JVD; No carotid bruits CARDIAC: RRR, no murmurs, rubs, gallops RESPIRATORY:  Clear to auscultation without rales, wheezing or rhonchi  ABDOMEN: Soft, non-tender, non-distended EXTREMITIES:  No edema; No deformity   ASSESSMENT AND PLAN  HFimpEF ICM Patient is euvolemic on exam.  Continue Coreg  3.125 mg twice daily, Farxiga  10 mg daily,  hydralazine  50 mg twice daily, Imdur  30 mg daily, losartan  25 mg daily.  Patient takes Lasix  as needed. He follows with advanced heart failure team.  CAD s/p CABG with stable angina The patient denies anginal symptoms. He tries to remain active but does no formal activity.  No further ischemic workup at this time.  Continue Plavix , Lipitor, Coreg , Zetia , and Imdur .  HLD LDL 54, TG 221, HDL 22, TC 104. Continue Lipitor 40mg  daily and Zetia  10mg  daily.   HTN BP today is elevated, but he just walked in from the parking lot.     Dispo: Follow-up in 1 year  Signed, Nisaiah Bechtol VEAR Fishman, [name]

## 2023-12-04 NOTE — Patient Instructions (Signed)
 Medication Instructions:  Your physician recommends that you continue on your current medications as directed. Please refer to the Current Medication list given to you today.    *If you need a refill on your cardiac medications before your next appointment, please call your pharmacy*  Lab Work: No labs ordered today    Testing/Procedures: No test ordered today   Follow-Up: At [facility], you and your health needs are our priority.  As part of our continuing mission to provide you with exceptional heart care, our providers are all part of one team.  This team includes your primary Cardiologist (physician) and Advanced Practice Providers or APPs (Physician Assistants and Nurse Practitioners) who all work together to provide you with the care you need, when you need it.  Your next appointment:   1 year(s)  Provider:   Evalene Lunger, MD or Cadence Franchester, [name]

## 2023-12-06 ENCOUNTER — Ambulatory Visit: Payer: 59

## 2023-12-08 ENCOUNTER — Other Ambulatory Visit: Payer: Self-pay | Admitting: Nurse Practitioner

## 2023-12-10 NOTE — Telephone Encounter (Signed)
 Requested Prescriptions  Pending Prescriptions Disp Refills   dapagliflozin  propanediol (FARXIGA ) 10 MG TABS tablet [Pharmacy Med Name: FARXIGA  10 MG TABLET] 90 tablet 1    Sig: TAKE 1 TABLET BY MOUTH EVERY DAY     Endocrinology:  Diabetes - SGLT2 Inhibitors Failed - 12/10/2023  3:53 PM      Failed - Cr in normal range and within 360 days    Creat  Date Value Ref Range Status  07/18/2023 1.73 (H) 0.70 - 1.28 mg/dL Final   Creatinine, Urine  Date Value Ref Range Status  07/18/2023 170 20 - 320 mg/dL Final         Failed - eGFR in normal range and within 360 days    GFR, Est African American  Date Value Ref Range Status  06/16/2020 69 > OR = 60 mL/min/1.17m2 Final   GFR, Est Non African American  Date Value Ref Range Status  06/16/2020 60 > OR = 60 mL/min/1.49m2 Final   GFR, Estimated  Date Value Ref Range Status  11/17/2022 38 (L) >60 mL/min Final    Comment:    (NOTE) Calculated using the CKD-EPI Creatinine Equation (2021)    eGFR  Date Value Ref Range Status  07/18/2023 40 (L) > OR = 60 mL/min/1.57m2 Final  02/15/2023 49 (L) >59 mL/min/1.73 Final         Passed - HBA1C is between 0 and 7.9 and within 180 days    Hemoglobin A1C  Date Value Ref Range Status  11/28/2023 7.8 (A) 4.0 - 5.6 % Final   Hgb A1c MFr Bld  Date Value Ref Range Status  07/18/2023 7.8 (H) <5.7 % Final    Comment:    For someone without known diabetes, a hemoglobin A1c value of 6.5% or greater indicates that they may have  diabetes and this should be confirmed with a follow-up  test. . For someone with known diabetes, a value <7% indicates  that their diabetes is well controlled and a value  greater than or equal to 7% indicates suboptimal  control. A1c targets should be individualized based on  duration of diabetes, age, comorbid conditions, and  other considerations. . Currently, no consensus exists regarding use of hemoglobin A1c for diagnosis of diabetes for children. SABRA Amy - Valid encounter within last 6 months    Recent Outpatient Visits           1 week ago HFrEF (heart failure with reduced ejection fraction) [facility])   [place] [facility] Gareth Clarity F, [name]   4 months ago Coronary artery disease involving native coronary artery of native heart without angina pectoris   [facility] Gareth Clarity FALCON, [place]

## 2024-01-16 ENCOUNTER — Other Ambulatory Visit: Payer: Self-pay | Admitting: Nurse Practitioner

## 2024-01-16 DIAGNOSIS — D509 Iron deficiency anemia, unspecified: Secondary | ICD-10-CM

## 2024-01-16 DIAGNOSIS — Z8673 Personal history of transient ischemic attack (TIA), and cerebral infarction without residual deficits: Secondary | ICD-10-CM

## 2024-01-16 DIAGNOSIS — I6521 Occlusion and stenosis of right carotid artery: Secondary | ICD-10-CM

## 2024-01-18 NOTE — Telephone Encounter (Signed)
 Requested medications are due for refill today.  yes  Requested medications are on the active medications list.  yes  Last refill. 07/18/2023 #90 1 rf  Future visit scheduled.   yes  Notes to clinic.  Expired labs    Requested Prescriptions  Pending Prescriptions Disp Refills   clopidogrel  (PLAVIX ) 75 MG tablet [Pharmacy Med Name: CLOPIDOGREL  75 MG TABLET] 90 tablet 1    Sig: TAKE 1 TABLET BY MOUTH EVERY DAY     Hematology: Antiplatelets - clopidogrel  Failed - 01/18/2024  2:36 PM      Failed - HCT in normal range and within 180 days    HCT  Date Value Ref Range Status  07/18/2023 44.9 38.5 - 50.0 % Final   Hematocrit  Date Value Ref Range Status  03/15/2023 40.5 37.5 - 51.0 % Final         Failed - HGB in normal range and within 180 days    Hemoglobin  Date Value Ref Range Status  07/18/2023 14.0 13.2 - 17.1 g/dL Final  98/69/7974 87.6 (L) 13.0 - 17.7 g/dL Final         Failed - PLT in normal range and within 180 days    Platelets  Date Value Ref Range Status  07/18/2023 307 140 - 400 Thousand/uL Final  03/15/2023 358 150 - 450 x10E3/uL Final         Failed - Cr in normal range and within 360 days    Creat  Date Value Ref Range Status  07/18/2023 1.73 (H) 0.70 - 1.28 mg/dL Final   Creatinine, Urine  Date Value Ref Range Status  07/18/2023 170 20 - 320 mg/dL Final         Passed - Valid encounter within last 6 months    Recent Outpatient Visits           1 month ago HFrEF (heart failure with reduced ejection fraction) [facility])   [place] [facility] Gareth Mliss FALCON, [name]   6 months ago Coronary artery disease involving native coronary artery of native heart without angina pectoris   [facility] Gareth Mliss FALCON, [name]              Signed Prescriptions Disp Refills   ferrous sulfate  325 (65 FE) MG tablet 90 tablet 1    Sig: TAKE 1 TABLET BY MOUTH EVERY DAY     Endocrinology:  Minerals - Iron  Supplementation  Failed - 01/18/2024  2:36 PM      Failed - Ferritin in normal range and within 360 days    Ferritin  Date Value Ref Range Status  07/18/2023 16 (L) 24 - 380 ng/mL Final  03/15/2023 20 (L) 30 - 400 ng/mL Final         Passed - HGB in normal range and within 360 days    Hemoglobin  Date Value Ref Range Status  07/18/2023 14.0 13.2 - 17.1 g/dL Final  98/69/7974 87.6 (L) 13.0 - 17.7 g/dL Final         Passed - HCT in normal range and within 360 days    HCT  Date Value Ref Range Status  07/18/2023 44.9 38.5 - 50.0 % Final   Hematocrit  Date Value Ref Range Status  03/15/2023 40.5 37.5 - 51.0 % Final         Passed - RBC in normal range and within 360 days    RBC  Date Value Ref Range Status  07/18/2023 5.12 4.20 -  5.80 Million/uL Final         Passed - Fe (serum) in normal range and within 360 days    Iron   Date Value Ref Range Status  07/18/2023 164 50 - 180 mcg/dL Final  98/69/7974 51 38 - 169 ug/dL Final   %SAT  Date Value Ref Range Status  07/18/2023 43 20 - 48 % (calc) Final         Passed - Valid encounter within last 12 months    Recent Outpatient Visits           1 month ago HFrEF (heart failure with reduced ejection fraction) [facility])   [facility] Gareth Clarity F, [name]   6 months ago Coronary artery disease involving native coronary artery of native heart without angina pectoris   [facility] Gareth Clarity FALCON, [place]

## 2024-01-18 NOTE — Telephone Encounter (Signed)
 Requested Prescriptions  Pending Prescriptions Disp Refills   ferrous sulfate  325 (65 FE) MG tablet [Pharmacy Med Name: FERROUS SULFATE  325 MG TABLET] 90 tablet 1    Sig: TAKE 1 TABLET BY MOUTH EVERY DAY     Endocrinology:  Minerals - Iron  Supplementation Failed - 01/18/2024  2:36 PM      Failed - Ferritin in normal range and within 360 days    Ferritin  Date Value Ref Range Status  07/18/2023 16 (L) 24 - 380 ng/mL Final  03/15/2023 20 (L) 30 - 400 ng/mL Final         Passed - HGB in normal range and within 360 days    Hemoglobin  Date Value Ref Range Status  07/18/2023 14.0 13.2 - 17.1 g/dL Final  98/69/7974 87.6 (L) 13.0 - 17.7 g/dL Final         Passed - HCT in normal range and within 360 days    HCT  Date Value Ref Range Status  07/18/2023 44.9 38.5 - 50.0 % Final   Hematocrit  Date Value Ref Range Status  03/15/2023 40.5 37.5 - 51.0 % Final         Passed - RBC in normal range and within 360 days    RBC  Date Value Ref Range Status  07/18/2023 5.12 4.20 - 5.80 Million/uL Final         Passed - Fe (serum) in normal range and within 360 days    Iron   Date Value Ref Range Status  07/18/2023 164 50 - 180 mcg/dL Final  98/69/7974 51 38 - 169 ug/dL Final   %SAT  Date Value Ref Range Status  07/18/2023 43 20 - 48 % (calc) Final         Passed - Valid encounter within last 12 months    Recent Outpatient Visits           1 month ago HFrEF (heart failure with reduced ejection fraction) [facility])   [facility] Gareth Mliss FALCON, [name]   6 months ago Coronary artery disease involving native coronary artery of native heart without angina pectoris   [facility] Gareth Mliss FALCON, [name]               clopidogrel  (PLAVIX ) 75 MG tablet [Pharmacy Med Name: CLOPIDOGREL  75 MG TABLET] 90 tablet 1    Sig: TAKE 1 TABLET BY MOUTH EVERY DAY     Hematology: Antiplatelets - clopidogrel  Failed - 01/18/2024  2:36 PM      Failed - HCT  in normal range and within 180 days    HCT  Date Value Ref Range Status  07/18/2023 44.9 38.5 - 50.0 % Final   Hematocrit  Date Value Ref Range Status  03/15/2023 40.5 37.5 - 51.0 % Final         Failed - HGB in normal range and within 180 days    Hemoglobin  Date Value Ref Range Status  07/18/2023 14.0 13.2 - 17.1 g/dL Final  98/69/7974 87.6 (L) 13.0 - 17.7 g/dL Final         Failed - PLT in normal range and within 180 days    Platelets  Date Value Ref Range Status  07/18/2023 307 140 - 400 Thousand/uL Final  03/15/2023 358 150 - 450 x10E3/uL Final         Failed - Cr in normal range and within 360 days    Creat  Date Value Ref Range Status  07/18/2023 1.73 (H) 0.70 - 1.28 mg/dL Final   Creatinine, Urine  Date Value Ref Range Status  07/18/2023 170 20 - 320 mg/dL Final         Passed - Valid encounter within last 6 months    Recent Outpatient Visits           1 month ago HFrEF (heart failure with reduced ejection fraction) [facility])   [facility] Gareth Clarity F, [name]   6 months ago Coronary artery disease involving native coronary artery of native heart without angina pectoris   [facility] Gareth Clarity FALCON, [place]

## 2024-01-28 ENCOUNTER — Telehealth: Payer: Self-pay | Admitting: Family

## 2024-01-28 NOTE — Telephone Encounter (Signed)
 Called to confirm/remind patient of their appointment at the [facility] on 01/29/24.   Appointment:   [x] Confirmed  [] Left mess   [] No answer/No voice mail  [] VM Full/unable to leave message  [] Phone not in service  Patient reminded to bring all medications and/or complete list.  Confirmed patient has transportation. Gave directions, instructed to utilize valet parking.

## 2024-01-28 NOTE — Progress Notes (Deleted)
 [facility] Note    PCP: Gareth Clarity, [name] (last seen 06/25) Primary Cardiologist: Gollan, Timothy, MD (last seen 12/24)  Chief Complaint: fatigue    HPI:  Benjamin Valentine is a 78 y/o male with a history of CAD (CABG 2022), HTN, melena, anemia, hyperlipidemia, CKD, arthritis, gout, stroke (10/23), carotid stenosis (angiogram 12/23), previous tobacco use and chronic heart failure.   Admitted 11/11/2020 for elevated tropinin. He presented to the hospital with increasing SOB along with ABD distention, and orthopena. Initial HStrop 59, EKG showed T wave inversion in the inferolateral leads. Echo showed decreased LV function with EF 25-30%, mid-mod LV dilation, mild Benjamin. Underwent R/L HC which showed significant 3-vessel disease with patent LIMA to LAD with occluded SVG to RCA and SVG to OM. RHC showed high normal filling pressure, moderate pulmonary hypertension, and mod to severe reduced CO. He was diuresed with lasix  but had increase creatinine above baseline. Admitted back to the hospital on 11/25/20 after presenting with new onset left arm and left knee numbness and weakness. Stroke workup was performed and was negative. He was seen by neurology and was thought symptoms were due to TIA.   Was in the ED 06/01/21 due to dizziness, headache and chest pain. Accidentally took a double dose of his medications today. Symptoms improved and he was released. Was in the ED 08/13/21 due to left wrist gout where he was evaluated and released.   Admitted 11/24/21 due to slurred speech and facial droop. Code stroke initiated on arrival. Low NIH, not a candidate for TNK. Discussed with neurology who recommends hospitalization for further stroke work-up. MRI is positive for acute infarct. CTA H&N showed severe multifocal stenosis. Held coreg , Entresto , Lasix , hydralazine  and Imdur  for permissive HTN, to be resumed after discharge. 01/25/22 had cerebral angiogram with intervention for RICA stenosis.    Admitted 12/06/22 due to SOB, fatigue, dizziness and found to have acute blood loss. Admission hemoglobin was 6.7. IV PPI given along with blood transfusions. Plavix  held. IV iron  given. Echo 12/07/22: EF 55% with moderate Benjamin, mild AR. To have outpatient GI appt for colonoscopy / possible EGD and to address resumption of plavix .   Echo 01/03/23: EF 50-55% with moderate LVH, Grade I DD, mild Benjamin and normal PA pressure of 17.2 mmHg  He presents today for a HF follow-up visit with a chief complaint of minimal fatigue. Denies shortness of breath, chest pain, cough, palpitations, abdominal distention, pedal edema, dizziness or difficulty sleeping.   Drinking 4-5 water bottles (16.9 oz each) and 20 oz soda daily. Has been eating watermelon recently as well.   Previous cardiac studies:  RHC/LHC done 11/15/20 and showed: Ost LM lesion is 20% stenosed.   Prox Cx to Mid Cx lesion is 99% stenosed.   Mid Cx to Dist Cx lesion is 99% stenosed.   1st Mrg lesion is 30% stenosed.   Mid LAD lesion is 100% stenosed.   Prox RCA lesion is 100% stenosed.   Origin lesion is 100% stenosed.   SVG.   LIMA graft was visualized by angiography and is normal in caliber.   The graft exhibits no disease. 1.  Significant underlying three-vessel coronary artery disease with occluded mid LAD, occluded proximal right coronary artery and subtotal occlusion of the mid left circumflex after the origin of a large high OM1 which supplies the majority of the left circumflex distribution. Patent LIMA to LAD with occluded SVG to RCA and SVG to OM. The RCA gets left-to-right collaterals. 2.  Left ventricular angiography was not performed due to chronic kidney disease. 3.  Right heart catheterization showed high normal filling pressures, moderate pulmonary hypertension and moderately to severely reduced cardiac output.  Wore zio monitor 10/24: Wear time: 13 days, 23 hours.  Min HR: 48BPM, Max 190BPM, average HR of 77BPM.   Predominant rhythm was normal sinus rhythm with first degree AVB.  50 SVT runs with longest interval lasting 20.5s at 111BPM.  Infrequent NSVT (x2 runs, 5 beats) PVC burden less than 2%.  ROS: All systems negative except as listed in HPI, PMH and Problem List.  SH:  Social History   Socioeconomic History   Marital status: Widowed    Spouse name: Not on file   Number of children: Not on file   Years of education: Not on file   Highest education level: Not on file  Occupational History   Not on file  Tobacco Use   Smoking status: Former   Smokeless tobacco: Never  Vaping Use   Vaping status: Never Used  Substance and Sexual Activity   Alcohol use: No   Drug use: No   Sexual activity: Not on file  Other Topics Concern   Not on file  Social History Narrative   Not on file   Social Drivers of Health   Tobacco Use: Medium Risk (12/04/2023)   Patient History    Smoking Tobacco Use: Former    Smokeless Tobacco Use: Never    Passive Exposure: Not on Actuary Strain: Low Risk (11/30/2022)   Overall Financial Resource Strain (CARDIA)    Difficulty of Paying Living Expenses: Not very hard  Food Insecurity: No Food Insecurity (11/30/2022)   Hunger Vital Sign    Worried About Running Out of Food in the Last Year: Never true    Ran Out of Food in the Last Year: Never true  Transportation Needs: No Transportation Needs (11/30/2022)   PRAPARE - Administrator, Civil Service (Medical): No    Lack of Transportation (Non-Medical): No  Physical Activity: Inactive (11/30/2022)   Exercise Vital Sign    Days of Exercise per Week: 0 days    Minutes of Exercise per Session: 0 min  Stress: No Stress Concern Present (11/30/2022)   [employer] of Occupational Health - Occupational Stress Questionnaire    Feeling of Stress : Not at all  Social Connections: Socially Isolated (11/30/2022)   Social Connection and Isolation Panel    Frequency of  Communication with Friends and Family: More than three times a week    Frequency of Social Gatherings with Friends and Family: More than three times a week    Attends Religious Services: Never    Database Administrator or Organizations: No    Attends Banker Meetings: Never    Marital Status: Widowed  Intimate Partner Violence: Not At Risk (11/30/2022)   Humiliation, Afraid, Rape, and Kick questionnaire    Fear of Current or Ex-Partner: No    Emotionally Abused: No    Physically Abused: No    Sexually Abused: No  Depression (PHQ2-9): Low Risk (11/28/2023)   Depression (PHQ2-9)    PHQ-2 Score: 0  Alcohol Screen: Low Risk (11/30/2022)   Alcohol Screen    Last Alcohol Screening Score (AUDIT): 0  Housing: Low Risk (11/30/2022)   Housing    Last Housing Risk Score: 0  Utilities: Not At Risk (11/30/2022)   AHC Utilities    Threatened with loss of utilities: No  Health Literacy: Adequate Health Literacy (11/30/2022)   B1300 Health Literacy    Frequency of need for help with medical instructions: Never    FH: No family history on file.  Past Medical History:  Diagnosis Date   Arthritis    CHF (congestive heart failure) (HCC)    Chronic HFrEF (heart failure with reduced ejection fraction) (HCC)    a. 10/2020 Echo: EF 25-30%, nl RV fxn, mild Benjamin.   CKD (chronic kidney disease), stage III (HCC)    Coronary artery disease    a. 2002 s/p CABG x 2: LIMA->LAD, VG->RPDA; b. 11/2020 Cath: LM 20ost, LAD 123m, LCX 99p/m, 82m/d, OM1 30, RCA 100p - fills via collats from LAD. RPL1 fills via collats from LCX. VG->PDA 100, LIMA->LAD ok-->med rx.   Gout    Hyperlipidemia LDL goal <70    Hypertension    Ischemic cardiomyopathy    a. 10/2020 Echo: EF 25-30%.   Osteoarthritis     Current Outpatient Medications  Medication Sig Dispense Refill   allopurinol  (ZYLOPRIM ) 100 MG tablet TAKE 1 TABLET BY MOUTH EVERY DAY 90 tablet 3   atorvastatin  (LIPITOR) 40 MG tablet TAKE 1 TABLET BY  MOUTH EVERY DAY 90 tablet 0   Blood Pressure Monitoring (ADULT BLOOD PRESSURE CUFF LG) KIT 1 each by Does not apply route as needed (to check blood pressure). 1 kit 0   carvedilol  (COREG ) 3.125 MG tablet TAKE 1 TABLET BY MOUTH TWICE A DAY WITH FOOD 180 tablet 2   clopidogrel  (PLAVIX ) 75 MG tablet TAKE 1 TABLET BY MOUTH EVERY DAY 90 tablet 0   dapagliflozin  propanediol (FARXIGA ) 10 MG TABS tablet TAKE 1 TABLET BY MOUTH EVERY DAY 90 tablet 1   ezetimibe  (ZETIA ) 10 MG tablet TAKE 1 TABLET BY MOUTH EVERY DAY 90 tablet 3   ferrous sulfate  325 (65 FE) MG tablet TAKE 1 TABLET BY MOUTH EVERY DAY 90 tablet 1   furosemide  (LASIX ) 20 MG tablet Take 1 tablet (20 mg total) by mouth daily as needed. 90 tablet 1   hydrALAZINE  (APRESOLINE ) 50 MG tablet Take 1 tablet (50 mg total) by mouth in the morning and at bedtime. 180 tablet 1   isosorbide  mononitrate (IMDUR ) 30 MG 24 hr tablet TAKE 1 TABLET BY MOUTH EVERY DAY 90 tablet 0   losartan  (COZAAR ) 25 MG tablet TAKE 1 TABLET (25 MG TOTAL) BY MOUTH DAILY. 90 tablet 3   Semaglutide  (RYBELSUS ) 3 MG TABS Take 1 tablet (3 mg total) by mouth daily. 90 tablet 1   tiZANidine  (ZANAFLEX ) 4 MG tablet Take 0.5-1.5 tablets (2-6 mg total) by mouth every 8 (eight) hours as needed for muscle spasms (muscle tightness). 90 tablet 0   No current facility-administered medications for this visit.   There were no vitals filed for this visit.  Wt Readings from Last 3 Encounters:  12/04/23 218 lb 12.8 oz (99.2 kg)  11/28/23 217 lb (98.4 kg)  08/09/23 218 lb (98.9 kg)   Lab Results  Component Value Date   CREATININE 1.73 (H) 07/18/2023   CREATININE 1.47 (H) 02/15/2023   CREATININE 1.56 (H) 01/03/2023    PHYSICAL EXAM:  General: Well appearing. No resp difficulty HEENT: normal Neck: supple, no JVD Cor: Regular rhythm, rate. No rubs, gallops or murmurs Lungs: clear Abdomen: soft, nontender, nondistended. Extremities: no cyanosis, clubbing, rash, trace pedal edema bilateral  shins Neuro: alert & oriented X 3. Moves all 4 extremities w/o difficulty. Affect pleasant   ECG: not done   ASSESSMENT & PLAN:  1: NICM with preserved ejection fraction- - suspect due to HTN - NYHA class II - euvolemic today - weight down 2 pounds from last visit here 6 months ago - Echo 11/11/20: EF 25-30% along with mild Benjamin.  - Echo 12/07/22: EF 55% with moderate Benjamin, mild AR (done @ [facility]) - Echo 01/03/23: EF 50-55% with moderate LVH, Grade I DD, mild Benjamin and normal PA pressure of 17.2 mmHg - not adding salt and friend (daughter) does most of the cooking and doesn't cook with salt either - continue carvedilol  3.125mg  BID (was unable to tolerate metoprolol ) - continue farxiga  10mg  daily - continue furosemide  20mg  PRN; has not taken recently - continue hydralazine  50mg  BID/ isosorbide  MN 30mg  daily - continue losartan  25mg  daily - potassium has been >5.0 so may not tolerate MRA - unable to tolerate entresto  - proBNT 12/07/22 was 387.0  2: HTN with CKD- - BP 122/56 - saw PCP Murlean) 06/25 - BMP 07/18/23 reviewed: sodium 139, potassium 4.6, creatinine 1.73 and GFR 40 - saw nephrology (Kolluru) 06/25  3: CAD- - CABG back in 2002 - saw cardiology Florestine) 12/24 - continue clopidogrel  75mg  daily - RHC/LHC done 11/15/20 and showed: Ost LM lesion is 20% stenosed.   Prox Cx to Mid Cx lesion is 99% stenosed.   Mid Cx to Dist Cx lesion is 99% stenosed.   1st Mrg lesion is 30% stenosed.   Mid LAD lesion is 100% stenosed.   Prox RCA lesion is 100% stenosed.   Origin lesion is 100% stenosed.   SVG.   LIMA graft was visualized by angiography and is normal in caliber.   The graft exhibits no disease. 1.  Significant underlying three-vessel coronary artery disease with occluded mid LAD, occluded proximal right coronary artery and subtotal occlusion of the mid left circumflex after the origin of a large high OM1 which supplies the majority of the left circumflex distribution. Patent LIMA  to LAD with occluded SVG to RCA and SVG to OM. The RCA gets left-to-right collaterals. 2.  Left ventricular angiography was not performed due to chronic kidney disease. 3.  Right heart catheterization showed high normal filling pressures, moderate pulmonary hypertension and moderately to severely reduced cardiac output.  4: Stroke- - 01/25/22 had cerebral angiogram with intervention for RICA stenosis - has not  had f/u with Dr. Dolphus, interventional radiology   5: Diabetes- - A1c 07/18/23 was 7.8%  6: Anemia- - received blood transfusions during 10/24 admission - HG 07/18/23 was 14.0 - saw GI Elodie) 01/25 & had subsequent EGD; declined colonoscopy   7: Hyperlipidemia- - LDL 07/18/23 was 54 - continue atorvastatin  40mg  daily/ zetia  10mg  daily   Return in 6 months, sooner if needed.   Ellouise DELENA Class, [name] 01/28/2024

## 2024-01-29 ENCOUNTER — Telehealth: Payer: Self-pay | Admitting: Family

## 2024-01-29 ENCOUNTER — Encounter: Admitting: Family

## 2024-01-29 NOTE — Telephone Encounter (Signed)
 Patient did not show for his Heart Failure Clinic appointment on 01/29/24.

## 2024-02-01 ENCOUNTER — Other Ambulatory Visit: Payer: Self-pay | Admitting: Nurse Practitioner

## 2024-02-01 DIAGNOSIS — I2510 Atherosclerotic heart disease of native coronary artery without angina pectoris: Secondary | ICD-10-CM

## 2024-02-01 DIAGNOSIS — Z951 Presence of aortocoronary bypass graft: Secondary | ICD-10-CM

## 2024-02-01 DIAGNOSIS — I5020 Unspecified systolic (congestive) heart failure: Secondary | ICD-10-CM

## 2024-02-05 NOTE — Telephone Encounter (Signed)
 Requested Prescriptions  Pending Prescriptions Disp Refills   carvedilol  (COREG ) 3.125 MG tablet [Pharmacy Med Name: CARVEDILOL  3.125 MG TABLET] 180 tablet 0    Sig: TAKE 1 TABLET BY MOUTH TWICE A DAY WITH FOOD     Cardiovascular: Beta Blockers 3 Failed - 02/05/2024 10:38 AM      Failed - Cr in normal range and within 360 days    Creat  Date Value Ref Range Status  07/18/2023 1.73 (H) 0.70 - 1.28 mg/dL Final   Creatinine, Urine  Date Value Ref Range Status  07/18/2023 170 20 - 320 mg/dL Final         Failed - Last BP in normal range    BP Readings from Last 1 Encounters:  12/04/23 (!) 150/80         Passed - AST in normal range and within 360 days    AST  Date Value Ref Range Status  07/18/2023 15 10 - 35 U/L Final   SGOT(AST)  Date Value Ref Range Status  09/20/2013 29 15 - 37 Unit/L Final         Passed - ALT in normal range and within 360 days    ALT  Date Value Ref Range Status  07/18/2023 12 9 - 46 U/L Final   SGPT (ALT)  Date Value Ref Range Status  09/20/2013 40 U/L Final    Comment:    14-63 NOTE: New Reference Range 09/02/13          Passed - Last Heart Rate in normal range    Pulse Readings from Last 1 Encounters:  12/04/23 66         Passed - Valid encounter within last 6 months    Recent Outpatient Visits           2 months ago HFrEF (heart failure with reduced ejection fraction) [facility])   [facility] Gareth Mliss FALCON, [name]   6 months ago Coronary artery disease involving native coronary artery of native heart without angina pectoris   [facility] Gareth Mliss FALCON, [name]               atorvastatin  (LIPITOR) 40 MG tablet [Pharmacy Med Name: ATORVASTATIN  40 MG TABLET] 90 tablet 0    Sig: TAKE 1 TABLET BY MOUTH EVERY DAY     Cardiovascular:  Antilipid - Statins Failed - 02/05/2024 10:38 AM      Failed - Lipid Panel in normal range within the last 12 months    Cholesterol  Date Value Ref  Range Status  07/18/2023 104 <200 mg/dL Final   LDL Cholesterol (Calc)  Date Value Ref Range Status  07/18/2023 54 mg/dL (calc) Final    Comment:    Reference range: <100 . Desirable range <100 mg/dL for primary prevention;   <70 mg/dL for patients with CHD or diabetic patients  with > or = 2 CHD risk factors. SABRA LDL-C is now calculated using the Martin-Hopkins  calculation, which is a validated novel method providing  better accuracy than the Friedewald equation in the  estimation of LDL-C.  Gladis APPLETHWAITE et al. SANDREA. 7986;689(80): 2061-2068  (http://education.[facility].com/faq/FAQ164)    HDL  Date Value Ref Range Status  07/18/2023 22 (L) > OR = 40 mg/dL Final   Triglycerides  Date Value Ref Range Status  07/18/2023 221 (H) <150 mg/dL Final    Comment:    . If a non-fasting specimen was collected, consider repeat triglyceride testing on a fasting  specimen if clinically indicated.  Veatrice et al. J. of Clin. Lipidol. 2015;9:129-169. SABRA          Passed - Patient is not pregnant      Passed - Valid encounter within last 12 months    Recent Outpatient Visits           2 months ago HFrEF (heart failure with reduced ejection fraction) [facility])   [facility] Gareth Clarity F, [name]   6 months ago Coronary artery disease involving native coronary artery of native heart without angina pectoris   [facility] Gareth Clarity FALCON, [name]               isosorbide  mononitrate (IMDUR ) 30 MG 24 hr tablet [Pharmacy Med Name: ISOSORBIDE  MONONIT ER 30 MG TB] 90 tablet 0    Sig: TAKE 1 TABLET BY MOUTH EVERY DAY     Cardiovascular:  Nitrates Failed - 02/05/2024 10:38 AM      Failed - Last BP in normal range    BP Readings from Last 1 Encounters:  12/04/23 (!) 150/80         Passed - Last Heart Rate in normal range    Pulse Readings from Last 1 Encounters:  12/04/23 66         Passed - Valid encounter within last 12 months     Recent Outpatient Visits           2 months ago HFrEF (heart failure with reduced ejection fraction) [facility])   [facility] Gareth Clarity F, [name]   6 months ago Coronary artery disease involving native coronary artery of native heart without angina pectoris   [facility] Gareth Clarity FALCON, [place]

## 2024-02-10 ENCOUNTER — Emergency Department: Admission: EM | Admit: 2024-02-10 | Discharge: 2024-02-10 | Disposition: A | Discharge status: Home / Self Care

## 2024-02-10 ENCOUNTER — Emergency Department

## 2024-02-10 ENCOUNTER — Other Ambulatory Visit: Payer: Self-pay

## 2024-02-10 DIAGNOSIS — I110 Hypertensive heart disease with heart failure: Secondary | ICD-10-CM | POA: Insufficient documentation

## 2024-02-10 DIAGNOSIS — R509 Fever, unspecified: Secondary | ICD-10-CM | POA: Diagnosis present

## 2024-02-10 DIAGNOSIS — Z955 Presence of coronary angioplasty implant and graft: Secondary | ICD-10-CM | POA: Diagnosis not present

## 2024-02-10 DIAGNOSIS — Z951 Presence of aortocoronary bypass graft: Secondary | ICD-10-CM | POA: Diagnosis not present

## 2024-02-10 DIAGNOSIS — I2510 Atherosclerotic heart disease of native coronary artery without angina pectoris: Secondary | ICD-10-CM | POA: Insufficient documentation

## 2024-02-10 DIAGNOSIS — J101 Influenza due to other identified influenza virus with other respiratory manifestations: Secondary | ICD-10-CM | POA: Diagnosis not present

## 2024-02-10 DIAGNOSIS — Z8673 Personal history of transient ischemic attack (TIA), and cerebral infarction without residual deficits: Secondary | ICD-10-CM | POA: Diagnosis not present

## 2024-02-10 DIAGNOSIS — R0602 Shortness of breath: Secondary | ICD-10-CM

## 2024-02-10 DIAGNOSIS — I509 Heart failure, unspecified: Secondary | ICD-10-CM | POA: Insufficient documentation

## 2024-02-10 LAB — RESP PANEL BY RT-PCR (RSV, FLU A&B, COVID)  RVPGX2
Influenza A by PCR: POSITIVE — AB
Influenza B by PCR: NEGATIVE
Resp Syncytial Virus by PCR: NEGATIVE
SARS Coronavirus 2 by RT PCR: NEGATIVE

## 2024-02-10 LAB — BASIC METABOLIC PANEL WITH GFR
Anion gap: 12 (ref 5–15)
BUN: 16 mg/dL (ref 8–23)
CO2: 24 mmol/L (ref 22–32)
Calcium: 8.8 mg/dL — ABNORMAL LOW (ref 8.9–10.3)
Chloride: 100 mmol/L (ref 98–111)
Creatinine, Ser: 1.17 mg/dL (ref 0.61–1.24)
GFR, Estimated: >60 mL/min
Glucose, Bld: 110 mg/dL — ABNORMAL HIGH (ref 70–99)
Potassium: 4.3 mmol/L (ref 3.5–5.1)
Sodium: 136 mmol/L (ref 135–145)

## 2024-02-10 LAB — CBC
HCT: 49.5 % (ref 39.0–52.0)
Hemoglobin: 16 g/dL (ref 13.0–17.0)
MCH: 29 pg (ref 26.0–34.0)
MCHC: 32.3 g/dL (ref 30.0–36.0)
MCV: 89.8 fL (ref 80.0–100.0)
Platelets: 212 K/uL (ref 150–400)
RBC: 5.51 MIL/uL (ref 4.22–5.81)
RDW: 14.2 % (ref 11.5–15.5)
WBC: 5.5 K/uL (ref 4.0–10.5)
nRBC: 0 % (ref 0.0–0.2)

## 2024-02-10 LAB — TROPONIN T, HIGH SENSITIVITY
Troponin T High Sensitivity: 29 ng/L — ABNORMAL HIGH (ref 0–19)
Troponin T High Sensitivity: 29 ng/L — ABNORMAL HIGH (ref 0–19)

## 2024-02-10 LAB — PROCALCITONIN: Procalcitonin: 0.11 ng/mL

## 2024-02-10 LAB — PRO BRAIN NATRIURETIC PEPTIDE: Pro Brain Natriuretic Peptide: 1109 pg/mL — ABNORMAL HIGH

## 2024-02-10 LAB — LACTIC ACID, PLASMA: Lactic Acid, Venous: 1 mmol/L (ref 0.5–1.9)

## 2024-02-10 MED ORDER — OSELTAMIVIR PHOSPHATE 75 MG PO CAPS: 75.0000 mg | ORAL_CAPSULE | Freq: Two times a day (BID) | ORAL | 0 refills | Status: AC

## 2024-02-10 MED ORDER — ONDANSETRON 4 MG PO TBDP: 4.0000 mg | ORAL_TABLET | Freq: Three times a day (TID) | ORAL | 0 refills | Status: AC | PRN

## 2024-02-10 MED ORDER — CARVEDILOL 6.25 MG PO TABS
3.1250 mg | ORAL_TABLET | Freq: Once | ORAL | Status: AC
  Administered 2024-02-10: 3.125 mg via ORAL
  Filled 2024-02-10: qty 1

## 2024-02-10 MED ORDER — OSELTAMIVIR PHOSPHATE 75 MG PO CAPS
75.0000 mg | ORAL_CAPSULE | Freq: Once | ORAL | Status: AC
  Administered 2024-02-10: 75 mg via ORAL
  Filled 2024-02-10: qty 1

## 2024-02-10 MED ORDER — ISOSORBIDE MONONITRATE ER 60 MG PO TB24
30.0000 mg | ORAL_TABLET | Freq: Once | ORAL | Status: AC
  Administered 2024-02-10: 30 mg via ORAL
  Filled 2024-02-10: qty 1

## 2024-02-10 MED ORDER — KETOROLAC TROMETHAMINE 10 MG PO TABS: 10.0000 mg | ORAL_TABLET | Freq: Four times a day (QID) | ORAL | 0 refills | Status: AC | PRN

## 2024-02-10 MED ORDER — ALBUTEROL SULFATE (2.5 MG/3ML) 0.083% IN NEBU: 3.0000 mL | INHALATION_SOLUTION | Freq: Once | RESPIRATORY_TRACT | Status: DC

## 2024-02-10 MED ORDER — IPRATROPIUM-ALBUTEROL 0.5-2.5 (3) MG/3ML IN SOLN
3.0000 mL | Freq: Once | RESPIRATORY_TRACT | Status: AC
  Administered 2024-02-10: 3 mL via RESPIRATORY_TRACT
  Filled 2024-02-10: qty 3

## 2024-02-10 MED ORDER — ALBUTEROL SULFATE HFA 108 (90 BASE) MCG/ACT IN AERS
2.0000 | INHALATION_SPRAY | Freq: Once | RESPIRATORY_TRACT | Status: DC
  Filled 2024-02-10: qty 6.7

## 2024-02-10 MED ORDER — ACETAMINOPHEN 325 MG PO TABS
650.0000 mg | ORAL_TABLET | Freq: Once | ORAL | Status: AC
  Administered 2024-02-10: 650 mg via ORAL
  Filled 2024-02-10: qty 2

## 2024-02-10 MED ORDER — KETOROLAC TROMETHAMINE 15 MG/ML IJ SOLN
15.0000 mg | Freq: Once | INTRAMUSCULAR | Status: AC
  Administered 2024-02-10: 15 mg via INTRAVENOUS
  Filled 2024-02-10: qty 1

## 2024-02-10 MED ORDER — ONDANSETRON HCL 4 MG/2ML IJ SOLN
4.0000 mg | Freq: Once | INTRAMUSCULAR | Status: AC
  Administered 2024-02-10: 4 mg via INTRAVENOUS
  Filled 2024-02-10: qty 2

## 2024-02-10 MED ORDER — HYDRALAZINE HCL 50 MG PO TABS
50.0000 mg | ORAL_TABLET | Freq: Once | ORAL | Status: AC
  Administered 2024-02-10: 50 mg via ORAL
  Filled 2024-02-10: qty 1

## 2024-02-10 MED ORDER — FUROSEMIDE 40 MG PO TABS
20.0000 mg | ORAL_TABLET | Freq: Once | ORAL | Status: AC
  Administered 2024-02-10: 20 mg via ORAL
  Filled 2024-02-10: qty 1

## 2024-02-10 MED ORDER — ALBUTEROL SULFATE (2.5 MG/3ML) 0.083% IN NEBU
3.0000 mL | INHALATION_SOLUTION | Freq: Once | RESPIRATORY_TRACT | Status: AC
  Administered 2024-02-10: 3 mL via RESPIRATORY_TRACT
  Filled 2024-02-10: qty 3

## 2024-02-10 MED ORDER — HYDRALAZINE HCL 20 MG/ML IJ SOLN
10.0000 mg | Freq: Once | INTRAMUSCULAR | Status: AC
  Administered 2024-02-10: 10 mg via INTRAVENOUS
  Filled 2024-02-10: qty 1

## 2024-02-10 MED ORDER — SUCRALFATE 1 G PO TABS: 1.0000 g | ORAL_TABLET | Freq: Three times a day (TID) | ORAL | 0 refills | Status: AC

## 2024-02-10 NOTE — Discharge Instructions (Signed)
 You were seen with shortness of breath and diagnosed with influenza.  You were offered admission today but chose to return home which I agree with at this time.  Please alternate between over-the-counter Tylenol  and your ketorolac .  Please make sure you take a copious amount of hydration.  If you are unable to take your medications use a dose of Zofran .  The sucralfate  is ordered to settle your stomach.  Please call your primary care physician and request a repeat creatinine check in 2 weeks time.  Please return with any acutely worsening symptoms. -- RETURN PRECAUTIONS & AFTERCARE: (ENGLISH) RETURN PRECAUTIONS: Return immediately to the emergency department or see/call your doctor if you feel worse, weak or have changes in speech or vision, are short of breath, have fever, vomiting, pain, bleeding or dark stool, trouble urinating or any new issues. Return here or see/call your doctor if not improving as expected for your suspected condition. FOLLOW-UP CARE: Call your doctor and/or any doctors we referred you to for more advice and to make an appointment. Do this today, tomorrow or after the weekend. Some doctors only take PPO insurance so if you have HMO insurance you may want to contact your HMO or your regular doctor for referral to a specialist within your plan. Either way tell the doctor's office that it was a referral from the emergency department so you get the soonest possible appointment.  YOUR TEST RESULTS: Take result reports of any blood or urine tests, imaging tests and EKG's to your doctor and any referral doctor. Have any abnormal tests repeated. Your doctor or a referral doctor can let you know when this should be done. Also make sure your doctor contacts this hospital to get any test results that are not currently available such as cultures or special tests for infection and final imaging reports, which are often not available at the time you leave the ER but which may list additional important  findings that are not documented on the preliminary report. BLOOD PRESSURE: If your blood pressure was greater than 120/80 have your blood pressure rechecked within 1 to 2 weeks. MEDICATION SIDE EFFECTS: Do not drive, walk, bike, take the bus, etc. if you have received or are being prescribed any sedating medications such as those for pain or anxiety or certain antihistamines like Benadryl. If you have been give one of these here get a taxi home or have a friend drive you home. Ask your pharmacist to counsel you on potential side effects of any new medication

## 2024-02-10 NOTE — ED Provider Notes (Signed)
 "  [facility] Provider Note    Event Date/Time   First MD Initiated Contact with Patient 02/10/24 [phone]     (approximate)   History   Shortness of Breath   HPI  Benjamin Valentine is a 78 y.o. male  78 y.o. male with a h/o CAD s/p CABG 2022, HFImpEF, HTN, HLD, OA, gout, acute stroke 02/2021, PAD, carotid stenosis on the right s/p stent assisted angioplasty 01/2022 who presents to the emergency department with 2 days of fever and chills and some chest tightness.  Patient reports that he did not take his blood pressure medication this morning.  Denies any chest pain abdominal pain or nausea vomiting.  He has not taken any Tylenol  or ibuprofen  prior to arrival.      Physical Exam   Triage Vital Signs: ED Triage Vitals  Encounter Vitals Group     BP 02/10/24 0843 (!) 203/102     Girls Systolic BP Percentile --      Girls Diastolic BP Percentile --      Boys Systolic BP Percentile --      Boys Diastolic BP Percentile --      Pulse Rate 02/10/24 0843 86     Resp 02/10/24 0843 20     Temp 02/10/24 0843 98.8 F (37.1 C)     Temp Source 02/10/24 0843 Oral     SpO2 02/10/24 0843 95 %     Weight 02/10/24 0839 211 lb (95.7 kg)     Height 02/10/24 0839 5' 9 (1.753 m)     Head Circumference --      Peak Flow --      Pain Score 02/10/24 0839 0     Pain Loc --      Pain Education --      Exclude from Growth Chart --     Most recent vital signs: Vitals:   02/10/24 1145 02/10/24 1200  BP: 105/66 (!) 108/95  Pulse: 77 76  Resp: (!) 36 (!) 21  Temp:    SpO2: 96% 97%    Nursing Triage Note reviewed. Vital signs reviewed and patients oxygen saturation is normoxic  General: Patient is well nourished, well developed, awake and alert, resting comfortably in no acute distress Head: Normocephalic and atraumatic Eyes: Normal inspection, extraocular muscles intact, no conjunctival pallor Ear, nose, throat: Normal external exam Neck: Normal range of  motion Respiratory: Patient is in no respiratory distress, lungs mild wheezes in the apices Cardiovascular: Patient is not tachycardic, RRR without murmur appreciated GI: Abd SNT with no guarding or rebound  Back: Normal inspection of the back with good strength and range of motion throughout all ext Extremities: pulses intact with good cap refills, no LE pitting edema or calf tenderness Neuro: The patient is alert and oriented to person, place, and time, appropriately conversive, with 5/5 bilat UE/LE strength, no gross motor or sensory defects noted. Coordination appears to be adequate. Skin: Warm, dry, and intact Psych: normal mood and affect, no SI or HI  ED Results / Procedures / Treatments   Labs (all labs ordered are listed, but only abnormal results are displayed) Labs Reviewed  RESP PANEL BY RT-PCR (RSV, FLU A&B, COVID)  RVPGX2 - Abnormal; Notable for the following components:      Result Value   Influenza A by PCR POSITIVE (*)    All other components within normal limits  BASIC METABOLIC PANEL WITH GFR - Abnormal; Notable for the following components:   Glucose,  Bld 110 (*)    Calcium  8.8 (*)    All other components within normal limits  PRO BRAIN NATRIURETIC PEPTIDE - Abnormal; Notable for the following components:   Pro Brain Natriuretic Peptide 1,109.0 (*)    All other components within normal limits  TROPONIN T, HIGH SENSITIVITY - Abnormal; Notable for the following components:   Troponin T High Sensitivity 29 (*)    All other components within normal limits  TROPONIN T, HIGH SENSITIVITY - Abnormal; Notable for the following components:   Troponin T High Sensitivity 29 (*)    All other components within normal limits  CBC  LACTIC ACID, PLASMA  PROCALCITONIN     EKG EKG and rhythm strip are interpreted by myself:   EKG: [Normal sinus rhythm] at heart rate of 88, normal QRS duration, QTc 428, nonspecific ST segments and T waves no ectopy EKG not consistent with  Acute STEMI Rhythm strip: NSR in lead II   RADIOLOGY Xray chest: No acute abnormality on my independent review interpretation radiologist agrees    PROCEDURES:  Critical Care performed: No  Procedures   MEDICATIONS ORDERED IN ED: Medications  albuterol  (VENTOLIN  HFA) 108 (90 Base) MCG/ACT inhaler 2 puff (has no administration in time range)  hydrALAZINE  (APRESOLINE ) injection 10 mg (10 mg Intravenous Given 02/10/24 1005)  carvedilol  (COREG ) tablet 3.125 mg (3.125 mg Oral Given 02/10/24 1004)  furosemide  (LASIX ) tablet 20 mg (20 mg Oral Given 02/10/24 1005)  hydrALAZINE  (APRESOLINE ) tablet 50 mg (50 mg Oral Given 02/10/24 1004)  isosorbide  mononitrate (IMDUR ) 24 hr tablet 30 mg (30 mg Oral Given 02/10/24 1004)  ipratropium-albuterol  (DUONEB) 0.5-2.5 (3) MG/3ML nebulizer solution 3 mL (3 mLs Nebulization Given 02/10/24 0905)  oseltamivir  (TAMIFLU ) capsule 75 mg (75 mg Oral Given 02/10/24 1114)  acetaminophen  (TYLENOL ) tablet 650 mg (650 mg Oral Given 02/10/24 1114)  ondansetron  (ZOFRAN ) injection 4 mg (4 mg Intravenous Given 02/10/24 1121)  ketorolac  (TORADOL ) 15 MG/ML injection 15 mg (15 mg Intravenous Given 02/10/24 1143)  albuterol  (PROVENTIL ) (2.5 MG/3ML) 0.083% nebulizer solution 3 mL (3 mLs Inhalation Given 02/10/24 1254)     IMPRESSION / MDM / ASSESSMENT AND PLAN / ED COURSE                                Differential diagnosis includes, but is not limited to, URI, pneumonia, electrolyte derangement anemia, CHF exacerbation  ED course: Patient arrives acutely with his extremely elevated blood pressure however he has not taken any of his home medications this morning.  He was administered a dose of IV labetalol and his home medications and blood pressure did downtrend to normal range.  Chest x-ray was unremarkable.  BNP is within normal for his age range.  Troponin was stable x 2.  He did return influenza positive.  He was administered an albuterol  nebulizer, Tylenol ,  Toradol  and given his age and comorbidities I have started him on Tamiflu .  Patient ambulated without dropping his oxygen and he took p.o.  He was offered admission today for symptom control but would rather rested in his own bed and given that he is not hypoxic and well-appearing I agree.  He was sent with scripts for Tamiflu  and ketorolac  and will return with any acutely worsening symptoms   Clinical Course as of 02/10/24 1609  Sun Feb 10, 2024  1000 Procalcitonin: 0.11 Not elevated [HD]  1000 Lactic Acid, Venous: 1.0 Not elevated [HD]  1000 Pro  Brain Natriuretic Peptide(!): 1,109.0 Not profoundly elevated and patient remains on room air no old to compare [HD]  1000 Troponin T High Sensitivity(!): 29 No old to compare [HD]  1021 Influenza A By PCR(!): POSITIVE Influenza positive [HD]  1023 Creatinine: 1.17 Not significantly low [HD]  1024 Given his comorbidities, I discussed the indications benefits risk alternatives of initiating Tamiflu  with the patient and he voiced understanding. [HD]  1114 Patient reassessed, states that he remains unwell.  Reviewed the results of his blood work thus far and his positive influenza test with his son at bedside.  Disposition pending [HD]  1128 DG Chest 2 View No evidence of pneumonia [HD]  1201 Troponin T High Sensitivity(!): 29 stable [HD]  1204 Patient eating a tray and appears much improved [HD]  1212 Patient's oxygen stayed 92% with ambulation and he walked with strong steady gait, nursing did noticed that his respirations did increase to 38 afterwards.  He is currently urinating will reassess afterwards [HD]    Clinical Course User Index [HD] Nicholaus Rolland BRAVO, MD   At time of discharge there is no evidence of acute life, limb, vision, or fertility threat. Patient has stable vital signs, pain is well controlled, patient is ambulatory and p.o. tolerant.  Discharge instructions were completed using the EPIC system. I would refer you to those at  this time. All warnings prescriptions follow-up etc. were discussed in detail with the patient. Patient indicates understanding and is agreeable with this plan. All questions answered.  Patient is made aware that they may return to the emergency department for any worsening or new condition or for any other emergency.  -- Risk: 5 This patient has a high risk of morbidity due to further diagnostic testing or treatment. Rationale: This patients evaluation and management involve a high risk of morbidity due to the potential severity of presenting symptoms, need for diagnostic testing, and/or initiation of treatment that may require close monitoring. The differential includes conditions with potential for significant deterioration or requiring escalation of care. Treatment decisions in the ED, including medication administration, procedural interventions, or disposition planning, reflect this level of risk. COPA: 5 The patient has the following acute or chronic illness/injury that poses a possible threat to life or bodily function: [X] : The patient has a potentially serious acute condition or an acute exacerbation of a chronic illness requiring urgent evaluation and management in the Emergency Department. The clinical presentation necessitates immediate consideration of life-threatening or function-threatening diagnoses, even if they are ultimately ruled out.   FINAL CLINICAL IMPRESSION(S) / ED DIAGNOSES   Final diagnoses:  Shortness of breath  Influenza A     Rx / DC Orders   ED Discharge Orders          Ordered    oseltamivir  (TAMIFLU ) 75 MG capsule  2 times daily        02/10/24 1220    oseltamivir  (TAMIFLU ) 75 MG capsule  2 times daily        02/10/24 1223    ondansetron  (ZOFRAN -ODT) 4 MG disintegrating tablet  Every 8 hours PRN        02/10/24 1223    sucralfate  (CARAFATE ) 1 g tablet  3 times daily with meals & bedtime        02/10/24 1223    ketorolac  (TORADOL ) 10 MG tablet  Every  6 hours PRN        02/10/24 1223             Note:  This document was prepared using Dragon voice recognition software and may include unintentional dictation errors.   Nicholaus Rolland BRAVO, MD 02/10/24 [phone]  "

## 2024-02-10 NOTE — ED Triage Notes (Signed)
 Pt to ED via POV for c/o chest tightness, DOE, fever, chills X 2 days.

## 2024-02-11 ENCOUNTER — Encounter: Admitting: Family

## 2024-02-26 ENCOUNTER — Ambulatory Visit (INDEPENDENT_AMBULATORY_CARE_PROVIDER_SITE_OTHER): Admitting: Nurse Practitioner

## 2024-02-26 ENCOUNTER — Encounter: Payer: Self-pay | Admitting: Nurse Practitioner

## 2024-02-26 VITALS — BP 138/82 | HR 85 | Temp 98.0°F | Ht 69.0 in | Wt 212.0 lb

## 2024-02-26 DIAGNOSIS — M7032 Other bursitis of elbow, left elbow: Secondary | ICD-10-CM

## 2024-02-26 DIAGNOSIS — Z7984 Long term (current) use of oral hypoglycemic drugs: Secondary | ICD-10-CM

## 2024-02-26 DIAGNOSIS — N1831 Chronic kidney disease, stage 3a: Secondary | ICD-10-CM

## 2024-02-26 DIAGNOSIS — J101 Influenza due to other identified influenza virus with other respiratory manifestations: Secondary | ICD-10-CM

## 2024-02-26 DIAGNOSIS — E1122 Type 2 diabetes mellitus with diabetic chronic kidney disease: Secondary | ICD-10-CM

## 2024-02-26 DIAGNOSIS — R051 Acute cough: Secondary | ICD-10-CM

## 2024-02-26 LAB — POCT GLYCOSYLATED HEMOGLOBIN (HGB A1C): Hemoglobin A1C: 7.1 % — AB (ref 4.0–5.6)

## 2024-02-26 MED ORDER — BENZONATATE 100 MG PO CAPS: 200.0000 mg | ORAL_CAPSULE | Freq: Two times a day (BID) | ORAL | 0 refills | Status: AC | PRN

## 2024-02-26 MED ORDER — PREDNISONE 20 MG PO TABS: 20.0000 mg | ORAL_TABLET | Freq: Every day | ORAL | 0 refills | Status: AC

## 2024-02-26 NOTE — Progress Notes (Signed)
 "  BP 138/82   Pulse 85   Temp 98 F (36.7 C)   Ht 5' 9 (1.753 m)   Wt 212 lb (96.2 kg)   SpO2 95%   BMI 31.31 kg/m    Subjective:    Patient ID: Benjamin Valentine, male    DOB: 25-May-1945, 79 y.o.   MRN: [id number]  HPI: Benjamin Valentine is a 79 y.o. male  Chief Complaint  Patient presents with   Medical Management of Chronic Issues   Discussed the use of AI scribe software for clinical note transcription with the patient, who gave verbal consent to proceed.  History of Present Illness Benjamin Valentine is a 79 year old male who presents for an ear follow-up after a recent emergency room visit for influenza A.  Respiratory symptoms and recent influenza a infection - Diagnosed with influenza A in the emergency room on February 10, 2024. - Prescribed Tamiflu  and ketorolac ; did not complete Tamiflu  course, with four or five pills remaining. - Current breathing described as 'slow'. - Congestion primarily in the chest, but no current feeling of sickness or congestion. - Chest X-ray during ER visit showed mild cardiomegaly. - Lab work: stable troponin at 29, normal procalcitonin and lactic acid, elevated proBNP at 1109, normal CBC and BMP.  Left elbow swelling - New swelling of the left elbow since "Sunday. - Described as a 'lump growth'. - Frequently leans on the left elbow.  Type 2 diabetes mellitus - Last A1c was 7.8., today it was 7.1 - Currently taking Rybelsus 3 mg daily and Farxiga 10 mg daily. - Not taking metformin due to previous low GFR, though last GFR was normal. - Inconsistent with medication regimen.  Other medications and intake - Drinks water regularly. - Taking sildenafil and Dovato; frequency and dosage not specified.         10" /15/2025    1:16 PM 07/18/2023    2:05 PM 01/29/2023   10:29 AM  Depression screen PHQ 2/9  Decreased Interest 0 0 0  Down, Depressed, Hopeless 0 0 0  PHQ - 2 Score 0 0 0  Altered sleeping 0 0 0  Tired, decreased energy 0 0 0   Change in appetite 0 0 0  Feeling bad or failure about yourself  0 0 0  Trouble concentrating 0 0 0  Moving slowly or fidgety/restless 0 0 0  Suicidal thoughts 0 0 0  PHQ-9 Score 0  0  0   Difficult doing work/chores Not difficult at all Not difficult at all      Data saved with a previous flowsheet row definition    Relevant past medical, surgical, family and social history reviewed and updated as indicated. Interim medical history since our last visit reviewed. Allergies and medications reviewed and updated.  Review of Systems  Ten systems reviewed and is negative except as mentioned in HPI      Objective:      BP 138/82   Pulse 85   Temp 98 F (36.7 C)   Ht 5' 9 (1.753 m)   Wt 212 lb (96.2 kg)   SpO2 95%   BMI 31.31 kg/m    Wt Readings from Last 3 Encounters:  02/26/24 212 lb (96.2 kg)  02/10/24 211 lb (95.7 kg)  12/04/23 218 lb 12.8 oz (99.2 kg)    Physical Exam GENERAL: Alert, cooperative, well developed, no acute distress HEENT: Normocephalic, normal oropharynx, moist mucous membranes CHEST: Clear to auscultation bilaterally, no wheezes, rhonchi,  or crackles CARDIOVASCULAR: Normal heart rate and rhythm, S1 and S2 normal without murmurs ABDOMEN: Soft, non-tender, non-distended, without organomegaly, normal bowel sounds EXTREMITIES: No cyanosis or edema MUSCULOSKELETAL: Left elbow bursitis NEUROLOGICAL: Cranial nerves grossly intact, moves all extremities without gross motor or sensory deficit  Results for orders placed or performed in visit on 02/26/24  POCT HgB A1C   Collection Time: 02/26/24 11:10 AM  Result Value Ref Range   Hemoglobin A1C 7.1 (A) 4.0 - 5.6 %   HbA1c POC (<> result, manual entry)     HbA1c, POC (prediabetic range)     HbA1c, POC (controlled diabetic range)            Assessment & Plan:   Problem List Items Addressed This Visit       Endocrine   Type 2 diabetes mellitus with diabetic chronic kidney disease (HCC) (Chronic)    Relevant Orders   POCT HgB A1C (Completed)   Other Visit Diagnoses       Influenza A    -  Primary   Relevant Medications   benzonatate  (TESSALON ) 100 MG capsule   predniSONE  (DELTASONE ) 20 MG tablet     Acute cough       Relevant Medications   benzonatate  (TESSALON ) 100 MG capsule   predniSONE  (DELTASONE ) 20 MG tablet     Bursitis of left elbow, unspecified bursa       Relevant Medications   predniSONE  (DELTASONE ) 20 MG tablet        Assessment and Plan Assessment & Plan Influenza A with acute cough Recent diagnosis of Influenza A with residual acute cough. Symptoms include slow breathing and chest congestion. Incomplete course of Tamiflu . No current congestion but occasional nighttime coughing. - Prescribed extra strength Mucinex  (glycine 1200 mg) twice daily for mucus management. - Advised to avoid dairy products to prevent thickening of mucus. - Prescribed cough medicine for nighttime coughing. - Educated on the importance of completing the full course of antiviral medication in the future.  Bursitis of left elbow Left elbow bursitis with a lump and swelling, likely due to leaning on the elbow. No signs of severe infection or severe inflammation. - Prescribed prednisone  20 mg for 5 days to reduce inflammation. - Advised to avoid leaning on the elbow to prevent recurrence. - Instructed to seek orthopedic evaluation if symptoms worsen for potential fluid drainage.  Type 2 diabetes mellitus with diabetic chronic kidney disease Type 2 diabetes with previous A1c of 7.8. Currently on Rybelsus  3 mg daily and Farxiga  10 mg daily. Not on metformin  due to previous low GFR, though recent GFR was normal. Blood sugar may increase with steroid use. - Performed point-of-care A1c test before starting prednisone . - Continue Rybelsus  3 mg daily and Farxiga  10 mg daily. - Monitor blood sugar levels, especially with steroid use. - Scheduled follow-up appointment in three months to reassess  A1c and diabetes management.        Follow up plan: Return if symptoms worsen or fail to improve. "

## 2024-03-31 ENCOUNTER — Ambulatory Visit: Admitting: Nurse Practitioner

## 2024-05-01 ENCOUNTER — Ambulatory Visit: Admitting: Nurse Practitioner
